# Patient Record
Sex: Female | Born: 1955
Health system: Southern US, Community
[De-identification: ages and names within clinical notes are randomized; demographics above are authoritative.]

## PROBLEM LIST (undated history)

## (undated) DIAGNOSIS — E785 Hyperlipidemia, unspecified: Secondary | ICD-10-CM

## (undated) DIAGNOSIS — G63 Polyneuropathy in diseases classified elsewhere: Secondary | ICD-10-CM

## (undated) DIAGNOSIS — I10 Essential (primary) hypertension: Secondary | ICD-10-CM

## (undated) DIAGNOSIS — R0600 Dyspnea, unspecified: Secondary | ICD-10-CM

## (undated) DIAGNOSIS — R112 Nausea with vomiting, unspecified: Secondary | ICD-10-CM

## (undated) DIAGNOSIS — Z9889 Other specified postprocedural states: Secondary | ICD-10-CM

## (undated) DIAGNOSIS — Z923 Personal history of irradiation: Secondary | ICD-10-CM

## (undated) DIAGNOSIS — I890 Lymphedema, not elsewhere classified: Secondary | ICD-10-CM

## (undated) DIAGNOSIS — C50919 Malignant neoplasm of unspecified site of unspecified female breast: Secondary | ICD-10-CM

## (undated) DIAGNOSIS — E119 Type 2 diabetes mellitus without complications: Secondary | ICD-10-CM

## (undated) DIAGNOSIS — C7951 Secondary malignant neoplasm of bone: Secondary | ICD-10-CM

## (undated) DIAGNOSIS — E669 Obesity, unspecified: Secondary | ICD-10-CM

## (undated) DIAGNOSIS — C801 Malignant (primary) neoplasm, unspecified: Secondary | ICD-10-CM

## (undated) HISTORY — DX: Malignant neoplasm of unspecified site of unspecified female breast: C50.919

## (undated) HISTORY — DX: Secondary malignant neoplasm of bone: C79.51

## (undated) HISTORY — DX: Obesity, unspecified: E66.9

## (undated) HISTORY — DX: Essential (primary) hypertension: I10

## (undated) HISTORY — DX: Lymphedema, not elsewhere classified: I89.0

## (undated) HISTORY — PX: CATARACT EXTRACTION: SUR2

## (undated) HISTORY — PX: MODIFIED RADICAL MASTECTOMY: SHX5703

## (undated) HISTORY — DX: Hyperlipidemia, unspecified: E78.5

## (undated) HISTORY — DX: Polyneuropathy in diseases classified elsewhere: G63

## (undated) HISTORY — PX: HYSTERECTOMY ABDOMINAL WITH SALPINGO-OOPHORECTOMY: SHX6792

## (undated) HISTORY — DX: Malignant (primary) neoplasm, unspecified: C80.1

## (undated) HISTORY — DX: Personal history of irradiation: Z92.3

---

## 2016-10-12 DIAGNOSIS — C50911 Malignant neoplasm of unspecified site of right female breast: Secondary | ICD-10-CM | POA: Diagnosis not present

## 2016-10-12 DIAGNOSIS — Z170 Estrogen receptor positive status [ER+]: Secondary | ICD-10-CM | POA: Diagnosis not present

## 2016-11-05 DIAGNOSIS — M272 Inflammatory conditions of jaws: Secondary | ICD-10-CM | POA: Diagnosis not present

## 2016-11-05 DIAGNOSIS — M8708 Idiopathic aseptic necrosis of bone, other site: Secondary | ICD-10-CM | POA: Diagnosis not present

## 2016-11-23 DIAGNOSIS — E119 Type 2 diabetes mellitus without complications: Secondary | ICD-10-CM | POA: Diagnosis not present

## 2016-11-23 DIAGNOSIS — E782 Mixed hyperlipidemia: Secondary | ICD-10-CM | POA: Diagnosis not present

## 2016-11-23 DIAGNOSIS — I10 Essential (primary) hypertension: Secondary | ICD-10-CM | POA: Diagnosis not present

## 2016-11-23 DIAGNOSIS — C50919 Malignant neoplasm of unspecified site of unspecified female breast: Secondary | ICD-10-CM | POA: Diagnosis not present

## 2016-12-14 DIAGNOSIS — Z170 Estrogen receptor positive status [ER+]: Secondary | ICD-10-CM | POA: Diagnosis not present

## 2016-12-14 DIAGNOSIS — C50911 Malignant neoplasm of unspecified site of right female breast: Secondary | ICD-10-CM | POA: Diagnosis not present

## 2016-12-25 DIAGNOSIS — I972 Postmastectomy lymphedema syndrome: Secondary | ICD-10-CM | POA: Diagnosis not present

## 2016-12-25 DIAGNOSIS — C50911 Malignant neoplasm of unspecified site of right female breast: Secondary | ICD-10-CM | POA: Diagnosis not present

## 2016-12-25 DIAGNOSIS — L905 Scar conditions and fibrosis of skin: Secondary | ICD-10-CM | POA: Diagnosis not present

## 2017-01-04 DIAGNOSIS — C50911 Malignant neoplasm of unspecified site of right female breast: Secondary | ICD-10-CM | POA: Diagnosis not present

## 2017-01-04 DIAGNOSIS — L905 Scar conditions and fibrosis of skin: Secondary | ICD-10-CM | POA: Diagnosis not present

## 2017-01-04 DIAGNOSIS — I972 Postmastectomy lymphedema syndrome: Secondary | ICD-10-CM | POA: Diagnosis not present

## 2017-01-10 DIAGNOSIS — I972 Postmastectomy lymphedema syndrome: Secondary | ICD-10-CM | POA: Diagnosis not present

## 2017-01-10 DIAGNOSIS — L905 Scar conditions and fibrosis of skin: Secondary | ICD-10-CM | POA: Diagnosis not present

## 2017-01-10 DIAGNOSIS — C50911 Malignant neoplasm of unspecified site of right female breast: Secondary | ICD-10-CM | POA: Diagnosis not present

## 2017-01-22 DIAGNOSIS — I972 Postmastectomy lymphedema syndrome: Secondary | ICD-10-CM | POA: Diagnosis not present

## 2017-01-22 DIAGNOSIS — C50911 Malignant neoplasm of unspecified site of right female breast: Secondary | ICD-10-CM | POA: Diagnosis not present

## 2017-01-22 DIAGNOSIS — L905 Scar conditions and fibrosis of skin: Secondary | ICD-10-CM | POA: Diagnosis not present

## 2017-01-31 DIAGNOSIS — C50911 Malignant neoplasm of unspecified site of right female breast: Secondary | ICD-10-CM | POA: Diagnosis not present

## 2017-01-31 DIAGNOSIS — L905 Scar conditions and fibrosis of skin: Secondary | ICD-10-CM | POA: Diagnosis not present

## 2017-01-31 DIAGNOSIS — I972 Postmastectomy lymphedema syndrome: Secondary | ICD-10-CM | POA: Diagnosis not present

## 2017-02-08 DIAGNOSIS — L905 Scar conditions and fibrosis of skin: Secondary | ICD-10-CM | POA: Diagnosis not present

## 2017-02-08 DIAGNOSIS — I972 Postmastectomy lymphedema syndrome: Secondary | ICD-10-CM | POA: Diagnosis not present

## 2017-02-08 DIAGNOSIS — C50911 Malignant neoplasm of unspecified site of right female breast: Secondary | ICD-10-CM | POA: Diagnosis not present

## 2017-02-08 DIAGNOSIS — Z170 Estrogen receptor positive status [ER+]: Secondary | ICD-10-CM | POA: Diagnosis not present

## 2017-02-15 DIAGNOSIS — L905 Scar conditions and fibrosis of skin: Secondary | ICD-10-CM | POA: Diagnosis not present

## 2017-02-15 DIAGNOSIS — C50911 Malignant neoplasm of unspecified site of right female breast: Secondary | ICD-10-CM | POA: Diagnosis not present

## 2017-02-15 DIAGNOSIS — I972 Postmastectomy lymphedema syndrome: Secondary | ICD-10-CM | POA: Diagnosis not present

## 2017-02-21 DIAGNOSIS — I972 Postmastectomy lymphedema syndrome: Secondary | ICD-10-CM | POA: Diagnosis not present

## 2017-02-21 DIAGNOSIS — L905 Scar conditions and fibrosis of skin: Secondary | ICD-10-CM | POA: Diagnosis not present

## 2017-02-21 DIAGNOSIS — C50911 Malignant neoplasm of unspecified site of right female breast: Secondary | ICD-10-CM | POA: Diagnosis not present

## 2017-02-28 DIAGNOSIS — C50911 Malignant neoplasm of unspecified site of right female breast: Secondary | ICD-10-CM | POA: Diagnosis not present

## 2017-02-28 DIAGNOSIS — I972 Postmastectomy lymphedema syndrome: Secondary | ICD-10-CM | POA: Diagnosis not present

## 2017-02-28 DIAGNOSIS — L905 Scar conditions and fibrosis of skin: Secondary | ICD-10-CM | POA: Diagnosis not present

## 2017-03-07 DIAGNOSIS — L905 Scar conditions and fibrosis of skin: Secondary | ICD-10-CM | POA: Diagnosis not present

## 2017-03-07 DIAGNOSIS — C50911 Malignant neoplasm of unspecified site of right female breast: Secondary | ICD-10-CM | POA: Diagnosis not present

## 2017-03-07 DIAGNOSIS — I972 Postmastectomy lymphedema syndrome: Secondary | ICD-10-CM | POA: Diagnosis not present

## 2017-03-14 DIAGNOSIS — L905 Scar conditions and fibrosis of skin: Secondary | ICD-10-CM | POA: Diagnosis not present

## 2017-03-14 DIAGNOSIS — C50911 Malignant neoplasm of unspecified site of right female breast: Secondary | ICD-10-CM | POA: Diagnosis not present

## 2017-03-14 DIAGNOSIS — I972 Postmastectomy lymphedema syndrome: Secondary | ICD-10-CM | POA: Diagnosis not present

## 2017-03-26 DIAGNOSIS — L905 Scar conditions and fibrosis of skin: Secondary | ICD-10-CM | POA: Diagnosis not present

## 2017-03-26 DIAGNOSIS — I972 Postmastectomy lymphedema syndrome: Secondary | ICD-10-CM | POA: Diagnosis not present

## 2017-03-26 DIAGNOSIS — C50911 Malignant neoplasm of unspecified site of right female breast: Secondary | ICD-10-CM | POA: Diagnosis not present

## 2017-03-29 DIAGNOSIS — C50911 Malignant neoplasm of unspecified site of right female breast: Secondary | ICD-10-CM | POA: Diagnosis not present

## 2017-03-29 DIAGNOSIS — Z170 Estrogen receptor positive status [ER+]: Secondary | ICD-10-CM | POA: Diagnosis not present

## 2017-04-09 DIAGNOSIS — L905 Scar conditions and fibrosis of skin: Secondary | ICD-10-CM | POA: Diagnosis not present

## 2017-04-09 DIAGNOSIS — I972 Postmastectomy lymphedema syndrome: Secondary | ICD-10-CM | POA: Diagnosis not present

## 2017-04-09 DIAGNOSIS — C50911 Malignant neoplasm of unspecified site of right female breast: Secondary | ICD-10-CM | POA: Diagnosis not present

## 2017-04-12 DIAGNOSIS — Z170 Estrogen receptor positive status [ER+]: Secondary | ICD-10-CM | POA: Diagnosis not present

## 2017-04-12 DIAGNOSIS — C50911 Malignant neoplasm of unspecified site of right female breast: Secondary | ICD-10-CM | POA: Diagnosis not present

## 2017-05-16 DIAGNOSIS — I10 Essential (primary) hypertension: Secondary | ICD-10-CM | POA: Diagnosis not present

## 2017-05-16 DIAGNOSIS — E119 Type 2 diabetes mellitus without complications: Secondary | ICD-10-CM | POA: Diagnosis not present

## 2017-05-16 DIAGNOSIS — E782 Mixed hyperlipidemia: Secondary | ICD-10-CM | POA: Diagnosis not present

## 2017-05-17 DIAGNOSIS — Z170 Estrogen receptor positive status [ER+]: Secondary | ICD-10-CM | POA: Diagnosis not present

## 2017-05-17 DIAGNOSIS — C50911 Malignant neoplasm of unspecified site of right female breast: Secondary | ICD-10-CM | POA: Diagnosis not present

## 2017-05-22 DIAGNOSIS — C50919 Malignant neoplasm of unspecified site of unspecified female breast: Secondary | ICD-10-CM | POA: Diagnosis not present

## 2017-05-22 DIAGNOSIS — E119 Type 2 diabetes mellitus without complications: Secondary | ICD-10-CM | POA: Diagnosis not present

## 2017-05-22 DIAGNOSIS — E782 Mixed hyperlipidemia: Secondary | ICD-10-CM | POA: Diagnosis not present

## 2017-05-22 DIAGNOSIS — G629 Polyneuropathy, unspecified: Secondary | ICD-10-CM | POA: Diagnosis not present

## 2017-05-22 DIAGNOSIS — I10 Essential (primary) hypertension: Secondary | ICD-10-CM | POA: Diagnosis not present

## 2017-06-11 DIAGNOSIS — I972 Postmastectomy lymphedema syndrome: Secondary | ICD-10-CM | POA: Diagnosis not present

## 2017-06-11 DIAGNOSIS — L905 Scar conditions and fibrosis of skin: Secondary | ICD-10-CM | POA: Diagnosis not present

## 2017-06-11 DIAGNOSIS — C50911 Malignant neoplasm of unspecified site of right female breast: Secondary | ICD-10-CM | POA: Diagnosis not present

## 2017-07-05 DIAGNOSIS — L905 Scar conditions and fibrosis of skin: Secondary | ICD-10-CM | POA: Diagnosis not present

## 2017-07-05 DIAGNOSIS — C50911 Malignant neoplasm of unspecified site of right female breast: Secondary | ICD-10-CM | POA: Diagnosis not present

## 2017-07-05 DIAGNOSIS — I972 Postmastectomy lymphedema syndrome: Secondary | ICD-10-CM | POA: Diagnosis not present

## 2017-07-16 DIAGNOSIS — C50911 Malignant neoplasm of unspecified site of right female breast: Secondary | ICD-10-CM | POA: Diagnosis not present

## 2017-07-16 DIAGNOSIS — I972 Postmastectomy lymphedema syndrome: Secondary | ICD-10-CM | POA: Diagnosis not present

## 2017-07-16 DIAGNOSIS — L905 Scar conditions and fibrosis of skin: Secondary | ICD-10-CM | POA: Diagnosis not present

## 2017-08-01 DIAGNOSIS — C50911 Malignant neoplasm of unspecified site of right female breast: Secondary | ICD-10-CM | POA: Diagnosis not present

## 2017-08-01 DIAGNOSIS — I972 Postmastectomy lymphedema syndrome: Secondary | ICD-10-CM | POA: Diagnosis not present

## 2017-08-01 DIAGNOSIS — L905 Scar conditions and fibrosis of skin: Secondary | ICD-10-CM | POA: Diagnosis not present

## 2017-08-26 DIAGNOSIS — L905 Scar conditions and fibrosis of skin: Secondary | ICD-10-CM | POA: Diagnosis not present

## 2017-08-26 DIAGNOSIS — I972 Postmastectomy lymphedema syndrome: Secondary | ICD-10-CM | POA: Diagnosis not present

## 2017-08-26 DIAGNOSIS — C50911 Malignant neoplasm of unspecified site of right female breast: Secondary | ICD-10-CM | POA: Diagnosis not present

## 2017-09-06 DIAGNOSIS — I972 Postmastectomy lymphedema syndrome: Secondary | ICD-10-CM | POA: Diagnosis not present

## 2017-09-06 DIAGNOSIS — L905 Scar conditions and fibrosis of skin: Secondary | ICD-10-CM | POA: Diagnosis not present

## 2017-09-06 DIAGNOSIS — C50911 Malignant neoplasm of unspecified site of right female breast: Secondary | ICD-10-CM | POA: Diagnosis not present

## 2017-09-17 DIAGNOSIS — L905 Scar conditions and fibrosis of skin: Secondary | ICD-10-CM | POA: Diagnosis not present

## 2017-09-17 DIAGNOSIS — I972 Postmastectomy lymphedema syndrome: Secondary | ICD-10-CM | POA: Diagnosis not present

## 2017-09-17 DIAGNOSIS — C50911 Malignant neoplasm of unspecified site of right female breast: Secondary | ICD-10-CM | POA: Diagnosis not present

## 2017-10-04 DIAGNOSIS — C50911 Malignant neoplasm of unspecified site of right female breast: Secondary | ICD-10-CM | POA: Diagnosis not present

## 2017-10-04 DIAGNOSIS — Z170 Estrogen receptor positive status [ER+]: Secondary | ICD-10-CM | POA: Diagnosis not present

## 2017-10-10 DIAGNOSIS — I972 Postmastectomy lymphedema syndrome: Secondary | ICD-10-CM | POA: Diagnosis not present

## 2017-10-10 DIAGNOSIS — L905 Scar conditions and fibrosis of skin: Secondary | ICD-10-CM | POA: Diagnosis not present

## 2017-10-10 DIAGNOSIS — C50911 Malignant neoplasm of unspecified site of right female breast: Secondary | ICD-10-CM | POA: Diagnosis not present

## 2017-10-24 DIAGNOSIS — L905 Scar conditions and fibrosis of skin: Secondary | ICD-10-CM | POA: Diagnosis not present

## 2017-10-24 DIAGNOSIS — Z170 Estrogen receptor positive status [ER+]: Secondary | ICD-10-CM | POA: Diagnosis not present

## 2017-10-24 DIAGNOSIS — C50911 Malignant neoplasm of unspecified site of right female breast: Secondary | ICD-10-CM | POA: Diagnosis not present

## 2017-10-24 DIAGNOSIS — I972 Postmastectomy lymphedema syndrome: Secondary | ICD-10-CM | POA: Diagnosis not present

## 2017-11-14 DIAGNOSIS — C50911 Malignant neoplasm of unspecified site of right female breast: Secondary | ICD-10-CM | POA: Diagnosis not present

## 2017-11-14 DIAGNOSIS — L905 Scar conditions and fibrosis of skin: Secondary | ICD-10-CM | POA: Diagnosis not present

## 2017-11-14 DIAGNOSIS — I972 Postmastectomy lymphedema syndrome: Secondary | ICD-10-CM | POA: Diagnosis not present

## 2017-11-20 DIAGNOSIS — I10 Essential (primary) hypertension: Secondary | ICD-10-CM | POA: Diagnosis not present

## 2017-11-20 DIAGNOSIS — E119 Type 2 diabetes mellitus without complications: Secondary | ICD-10-CM | POA: Diagnosis not present

## 2017-11-20 DIAGNOSIS — E782 Mixed hyperlipidemia: Secondary | ICD-10-CM | POA: Diagnosis not present

## 2017-11-20 LAB — BASIC METABOLIC PANEL
BUN: 14 (ref 4–21)
CREATININE: 0.9 (ref 0.5–1.1)
Glucose: 181
Potassium: 4.3 (ref 3.4–5.3)
Sodium: 137 (ref 137–147)

## 2017-11-20 LAB — HEMOGLOBIN A1C: HEMOGLOBIN A1C: 8.2

## 2017-11-29 DIAGNOSIS — C50911 Malignant neoplasm of unspecified site of right female breast: Secondary | ICD-10-CM | POA: Diagnosis not present

## 2017-11-29 DIAGNOSIS — Z170 Estrogen receptor positive status [ER+]: Secondary | ICD-10-CM | POA: Diagnosis not present

## 2017-12-04 DIAGNOSIS — E782 Mixed hyperlipidemia: Secondary | ICD-10-CM | POA: Diagnosis not present

## 2017-12-04 DIAGNOSIS — E1165 Type 2 diabetes mellitus with hyperglycemia: Secondary | ICD-10-CM | POA: Diagnosis not present

## 2017-12-04 DIAGNOSIS — C50919 Malignant neoplasm of unspecified site of unspecified female breast: Secondary | ICD-10-CM | POA: Diagnosis not present

## 2017-12-04 DIAGNOSIS — I10 Essential (primary) hypertension: Secondary | ICD-10-CM | POA: Diagnosis not present

## 2017-12-05 DIAGNOSIS — E782 Mixed hyperlipidemia: Secondary | ICD-10-CM | POA: Diagnosis not present

## 2017-12-05 DIAGNOSIS — E1165 Type 2 diabetes mellitus with hyperglycemia: Secondary | ICD-10-CM | POA: Diagnosis not present

## 2017-12-05 DIAGNOSIS — C50919 Malignant neoplasm of unspecified site of unspecified female breast: Secondary | ICD-10-CM | POA: Diagnosis not present

## 2017-12-05 DIAGNOSIS — I10 Essential (primary) hypertension: Secondary | ICD-10-CM | POA: Diagnosis not present

## 2017-12-10 DIAGNOSIS — I972 Postmastectomy lymphedema syndrome: Secondary | ICD-10-CM | POA: Diagnosis not present

## 2017-12-10 DIAGNOSIS — C50911 Malignant neoplasm of unspecified site of right female breast: Secondary | ICD-10-CM | POA: Diagnosis not present

## 2017-12-10 DIAGNOSIS — L905 Scar conditions and fibrosis of skin: Secondary | ICD-10-CM | POA: Diagnosis not present

## 2017-12-17 DIAGNOSIS — C7951 Secondary malignant neoplasm of bone: Secondary | ICD-10-CM | POA: Diagnosis not present

## 2017-12-17 DIAGNOSIS — Z853 Personal history of malignant neoplasm of breast: Secondary | ICD-10-CM | POA: Diagnosis not present

## 2017-12-17 DIAGNOSIS — C50911 Malignant neoplasm of unspecified site of right female breast: Secondary | ICD-10-CM | POA: Diagnosis not present

## 2017-12-24 DIAGNOSIS — L905 Scar conditions and fibrosis of skin: Secondary | ICD-10-CM | POA: Diagnosis not present

## 2017-12-24 DIAGNOSIS — I972 Postmastectomy lymphedema syndrome: Secondary | ICD-10-CM | POA: Diagnosis not present

## 2017-12-24 DIAGNOSIS — C50911 Malignant neoplasm of unspecified site of right female breast: Secondary | ICD-10-CM | POA: Diagnosis not present

## 2017-12-27 ENCOUNTER — Telehealth: Payer: Self-pay | Admitting: Oncology

## 2017-12-27 NOTE — Telephone Encounter (Signed)
Pt will be transferring care from [place]. Aware that she should bring all of her current records along with her in addition to what has been faxed. Pt aware to arrive 30 minutes early.

## 2018-01-07 DIAGNOSIS — L905 Scar conditions and fibrosis of skin: Secondary | ICD-10-CM | POA: Diagnosis not present

## 2018-01-07 DIAGNOSIS — C50911 Malignant neoplasm of unspecified site of right female breast: Secondary | ICD-10-CM | POA: Diagnosis not present

## 2018-01-07 DIAGNOSIS — I972 Postmastectomy lymphedema syndrome: Secondary | ICD-10-CM | POA: Diagnosis not present

## 2018-01-10 DIAGNOSIS — R51 Headache: Secondary | ICD-10-CM | POA: Diagnosis not present

## 2018-01-21 DIAGNOSIS — L905 Scar conditions and fibrosis of skin: Secondary | ICD-10-CM | POA: Diagnosis not present

## 2018-01-21 DIAGNOSIS — I972 Postmastectomy lymphedema syndrome: Secondary | ICD-10-CM | POA: Diagnosis not present

## 2018-01-21 DIAGNOSIS — C50911 Malignant neoplasm of unspecified site of right female breast: Secondary | ICD-10-CM | POA: Diagnosis not present

## 2018-01-24 DIAGNOSIS — Z170 Estrogen receptor positive status [ER+]: Secondary | ICD-10-CM | POA: Diagnosis not present

## 2018-01-24 DIAGNOSIS — C50911 Malignant neoplasm of unspecified site of right female breast: Secondary | ICD-10-CM | POA: Diagnosis not present

## 2018-01-31 DIAGNOSIS — E119 Type 2 diabetes mellitus without complications: Secondary | ICD-10-CM | POA: Diagnosis not present

## 2018-01-31 DIAGNOSIS — E782 Mixed hyperlipidemia: Secondary | ICD-10-CM | POA: Diagnosis not present

## 2018-01-31 DIAGNOSIS — I10 Essential (primary) hypertension: Secondary | ICD-10-CM | POA: Diagnosis not present

## 2018-01-31 LAB — LIPID PANEL
CHOLESTEROL: 183 (ref 0–200)
HDL: 42 (ref 35–70)
LDL CALC: 100
LDl/HDL Ratio: 4
Triglycerides: 205 — AB (ref 40–160)

## 2018-01-31 LAB — BASIC METABOLIC PANEL
BUN: 22 — AB (ref 4–21)
Creatinine: 1 (ref 0.5–1.1)
Glucose: 136
Potassium: 5.1 (ref 3.4–5.3)
Sodium: 140 (ref 137–147)

## 2018-01-31 LAB — HEPATIC FUNCTION PANEL
ALT: 26 (ref 7–35)
AST: 24 (ref 13–35)
Bilirubin, Total: 0.2

## 2018-01-31 LAB — HEMOGLOBIN A1C: HEMOGLOBIN A1C: 7.1

## 2018-02-04 DIAGNOSIS — C50911 Malignant neoplasm of unspecified site of right female breast: Secondary | ICD-10-CM | POA: Diagnosis not present

## 2018-02-04 DIAGNOSIS — I972 Postmastectomy lymphedema syndrome: Secondary | ICD-10-CM | POA: Diagnosis not present

## 2018-02-04 DIAGNOSIS — L905 Scar conditions and fibrosis of skin: Secondary | ICD-10-CM | POA: Diagnosis not present

## 2018-02-20 DIAGNOSIS — R51 Headache: Secondary | ICD-10-CM | POA: Diagnosis not present

## 2018-02-20 DIAGNOSIS — G44309 Post-traumatic headache, unspecified, not intractable: Secondary | ICD-10-CM | POA: Diagnosis not present

## 2018-02-20 DIAGNOSIS — S0990XS Unspecified injury of head, sequela: Secondary | ICD-10-CM | POA: Diagnosis not present

## 2018-02-20 DIAGNOSIS — I10 Essential (primary) hypertension: Secondary | ICD-10-CM | POA: Diagnosis not present

## 2018-03-10 NOTE — Progress Notes (Signed)
[facility] [facility]  [phone] [phone]     ID: Mia [name] DOB: April 21, 1956  MR#: [id number]  JYN#:[id number]  Patient Care Team: Lucille Passy, MD as PCP - General (Family Medicine) Magrinat, Virgie Dad, MD as Consulting Physician (Oncology) [name], [name] PA  CHIEF COMPLAINT: Estrogen receptor positive stage IV breast cancer  CURRENT TREATMENT: Letrozole, palbociclib   HISTORY OF CURRENT ILLNESS: We have reviewed the medical records from [place], which is the source of the information below:  Mia [name] was initially diagnosed with Stage IIIA (T2N2M0) invasive ductal carcinoma, estrogen receptor positive and HER2 negative right breast cancer in 2000. Mia [name] underwent right mastectomy, with 4 positive metastatic lymph nodes. Mia [name] had chemotherapy with taxol and cytoxan and fulvestrant in the past. Mia [name] had radiation and completed 5 years of tamoxifen.   Mia [name] was diagnosed with Stage IV disease 04/18/2005 with metastases to the bone, lung, and additional nodes. Mia [name] was on capecitabine but was discontinued after rising CA 27-29 and scans.   Mia [name] started anastrozole and everolimus with Delton See in April 2014. Mia [name] tolerated this well, but this was discontinued in July 2014 due to abnormal blood tests, hyperlipidemia and hyperglycemia.  Mia [name] began Abraxane '100mg'$  /m2 and Xgeva in August 2014 weekly x3 with 1 week off. Mia [name] tolerated this treatment well. Mia [name] discontinued Xgeva January 2016 due to osteonecrosis of the jaw and mouth issues. Mia [name] continued abraxane alone until her last dose on 11/18/2015 due to neuropathy and disease progression with restaging studies on 11/23/2015 with a CT chest abdomen and pelvis and one scan showing skull, sternal and femoral lesions.   Mia [name] was switched to gemcitabine starting 01/20/2016 weekly x3 and 1 week off. This was discontinued on 06/15/2016 due to a port related jugular clot on the right  side. Mia [name] was given Lovenox for 3 months.  Mia [name] was switched to Letrozole 2.5 mg and palbociclib 125 mg starting 07/13/2016. Mia [name] tolerated this well. Restaging studies with CT chest abdomen and pelvis and bone scan on 07/01/2017 showed stable/ improving lesions. CA 27-29 was stable.  The patient's subsequent history is as detailed below.  INTERVAL HISTORY: [name] was evaluated in the breast cancer clinic on 03/12/2018.  We reviewed additional records.  During the last few months of follow up in [place], Mia [name] completed restaging studies with a CT chest abdomen and pelvis at [facility] on 12/17/2017 showing: A 6 mm pulmonary nodule in the left upper lobe laterally. Progression of metastatic disease to the bones at the L4 and L5 vertebral bodies. Sclerotic metastases at T8, T10, an T11, are similar to previous exams. Sclerotic metastases in the sternum stable. Hepatic steatosis. Aortic atherosclerosis.  Most recent CA-18-29 (January 2019) was 58.  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 Mia [name] also completed 62 radiation treatments in the T spine and radiation in the femur. The patient denies unusual headaches, visual changes, nausea, vomiting, stiff neck, dizziness, or gait imbalance. There has been no cough, phlegm production, or pleurisy, no chest pain or pressure, and no change in bowel or bladder habits. The patient denies fever, rash, bleeding, unexplained fatigue or unexplained weight loss. A detailed review of systems was otherwise entirely negative.   PAST MEDICAL HISTORY: Past Medical History:  Diagnosis Date  . Breast cancer metastasized to bone ([place])   .  Hyperlipidemia   . Hypertension   . Lymphedema of right arm   . Neuropathy associated with cancer ([place])   . Obesity (BMI 35.0-39.9 without comorbidity)      PAST SURGICAL  HISTORY: Past Surgical History:  Procedure Laterality Date  . CATARACT EXTRACTION Left   . HYSTERECTOMY ABDOMINAL WITH SALPINGO-OOPHORECTOMY    . MODIFIED RADICAL MASTECTOMY Right       FAMILY HISTORY No family history on file.  The patient reports Mia [name] had negative genetic testing at [facility] 2014,report not available. --The patient's father died at age 22 due to a PE, and he also had a history of colon cancer diagnosed at age 44. The patient's mother died at age 23 due to diabetes and survived a cerebral aneurysm.The patient's mother also had a history of breast cancer diagnosed at age 54.  The patient has no brothers and 1 sister. The patient's sister was diagnosed with non invasive breast cancer at age 62. There was a paternal grandmother diagnosed with breast cancer at age 62. The patient denies a family history of ovarian cancer.    GYNECOLOGIC HISTORY:  No LMP recorded. Menarche: 62 years old Age at first live birth: 62 years old Mia [name] is [place] P2. Mia [name] is status post hysterectomy with bilateral salpingo- oophorectomy in 2009 at about age 62.  Mia [name] never used HRT.    SOCIAL HISTORY:  [name] is disabled due to her breast cancer. 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 Her husband, [name] is in the process of retiring. He is a Electrical engineer for a power company. The patient's daughter, Mia [name], lives in [place] and works as a Teaching laboratory technician. The patient's second daughter, Mia [name] is a stay at home mom and is a special needs teacher, who will soon move to [facility] The patient plans on attending [employer].      ADVANCED DIRECTIVES:    HEALTH MAINTENANCE: Social History   Tobacco Use  . Smoking status: Not on file  Substance Use Topics  . Alcohol use: Not on file  . Drug use: Not on file     Colonoscopy: 2017   PAP:  Bone density: never  Mammogram: 2017   Allergies  Allergen Reactions  .  Morphine And Related Anaphylaxis    Current Outpatient Medications  Medication Sig Dispense Refill  . amlodipine-atorvastatin (CADUET) 10-10 MG tablet Take 1 tablet by mouth daily.    Marland Kitchen aspirin 81 MG chewable tablet Chew by mouth daily.    . fenofibrate (TRICOR) 145 MG tablet Take 145 mg by mouth daily.    Marland Kitchen gabapentin (NEURONTIN) 300 MG capsule Take 300 mg by mouth 3 (three) times daily.    . Insulin Admin Supplies MISC Inject 24 Units into the skin daily.    Marland Kitchen letrozole (FEMARA) 2.5 MG tablet Take 2.5 mg by mouth daily.    Marland Kitchen losartan (COZAAR) 100 MG tablet Take 100 mg by mouth daily.    . metoprolol tartrate (LOPRESSOR) 25 MG tablet Take 25 mg by mouth daily.    Marland Kitchen oxyCODONE-acetaminophen (PERCOCET) 10-325 MG tablet Take 1 tablet by mouth 3 (three) times daily.    . palbociclib (IBRANCE) 125 MG capsule Take 125 mg by mouth daily with breakfast. Take whole with food. Take for 21 days on, 7 days off, repeat every 28 days.    . pantoprazole (PROTONIX) 40 MG tablet Take 40 mg by mouth daily.    . pioglitazone (ACTOS) 15 MG  tablet Take 15 mg by mouth daily.    . pravastatin (PRAVACHOL) 40 MG tablet Take 40 mg by mouth daily.    . sitaGLIPtin-metformin (JANUMET) 50-1000 MG tablet Take 1 tablet by mouth daily.     No current facility-administered medications for this visit.     OBJECTIVE: Middle-aged white woman who appears stated age  62:   03/12/18 1558  BP: 132/76  Pulse: 73  Resp: 18  Temp: 98.3 F (36.8 C)  SpO2: 98%     Body mass index is 38.42 kg/m.   Wt Readings from Last 3 Encounters:  03/12/18 222 lb 1.6 oz (100.7 kg)      ECOG FS:1 - Symptomatic but completely ambulatory  Ocular: Sclerae unicteric, pupils round and equal Ear-nose-throat: Oropharynx clear and moist Lymphatic: No cervical or supraclavicular adenopathy Lungs no rales or rhonchi Heart regular rate and rhythm Abd soft, nontender, positive bowel sounds MSK no focal spinal tenderness, chronic grade 1  right upper extremity lymphedema Neuro: non-focal, well-oriented, appropriate affect Breasts: The right breast is status post mastectomy.  There is no evidence of chest wall recurrence.  Left breast is unremarkable.  Both axillae are benign.   LAB RESULTS:  CMP     Component Value Date/Time   NA 138 03/12/2018 1544   K 4.3 03/12/2018 1544   CL 102 03/12/2018 1544   CO2 28 03/12/2018 1544   GLUCOSE 114 03/12/2018 1544   BUN 20 03/12/2018 1544   CREATININE 0.97 03/12/2018 1544   CALCIUM 9.7 03/12/2018 1544   PROT 7.1 03/12/2018 1544   ALBUMIN 3.7 03/12/2018 1544   AST 16 03/12/2018 1544   ALT 12 03/12/2018 1544   ALKPHOS 61 03/12/2018 1544   BILITOT 0.3 03/12/2018 1544   GFRNONAA >60 03/12/2018 1544   GFRAA >60 03/12/2018 1544    No results found for: TOTALPROTELP, ALBUMINELP, A1GS, A2GS, BETS, BETA2SER, GAMS, MSPIKE, SPEI  No results found for: KPAFRELGTCHN, LAMBDASER, KAPLAMBRATIO  Lab Results  Component Value Date   WBC 3.0 (L) 03/12/2018   NEUTROABS 2.0 03/12/2018   HGB 11.5 (L) 03/12/2018   HCT 35.2 03/12/2018   MCV 102.3 (H) 03/12/2018   PLT 382 03/12/2018    '@LASTCHEMISTRY'$ @  No results found for: LABCA2  No components found for: ASNKNL976  No results for input(s): INR in the last 168 hours.  No results found for: LABCA2  No results found for: BHA193  No results found for: XTK240  No results found for: XBD532  No results found for: CA2729  No components found for: HGQUANT  No results found for: CEA1 / No results found for: CEA1   No results found for: AFPTUMOR  No results found for: CHROMOGRNA  No results found for: PSA1  Appointment on 03/12/2018  Component Date Value Ref Range Status  . Sodium 03/12/2018 138  136 - 145 mmol/L Final  . Potassium 03/12/2018 4.3  3.5 - 5.1 mmol/L Final  . Chloride 03/12/2018 102  98 - 109 mmol/L Final  . CO2 03/12/2018 28  22 - 29 mmol/L Final  . Glucose, Bld 03/12/2018 114  70 - 140 mg/dL Final  . BUN  03/12/2018 20  7 - 26 mg/dL Final  . Creatinine, Ser 03/12/2018 0.97  0.60 - 1.10 mg/dL Final  . Calcium 03/12/2018 9.7  8.4 - 10.4 mg/dL Final  . Total Protein 03/12/2018 7.1  6.4 - 8.3 g/dL Final  . Albumin 03/12/2018 3.7  3.5 - 5.0 g/dL Final  . AST 03/12/2018 16  5 - 34 U/L Final  . ALT 03/12/2018 12  0 - 55 U/L Final  . Alkaline Phosphatase 03/12/2018 61  40 - 150 U/L Final  . Total Bilirubin 03/12/2018 0.3  0.2 - 1.2 mg/dL Final  . GFR calc non Af Amer 03/12/2018 >60  >60 mL/min Final  . GFR calc Af Amer 03/12/2018 >60  >60 mL/min Final   Comment: (NOTE) The eGFR has been calculated using the CKD EPI equation. This calculation has not been validated in all clinical situations. eGFR's persistently <60 mL/min signify possible Chronic Kidney Disease.   Georgiann Hahn gap 03/12/2018 8  3 - 11 Final   Performed at [facility], [place] [address] [place], [place] [address]  . WBC 03/12/2018 3.0* 3.9 - 10.3 K/uL Final  . RBC 03/12/2018 3.44* 3.70 - 5.45 MIL/uL Final  . Hemoglobin 03/12/2018 11.5* 11.6 - 15.9 g/dL Final  . HCT 03/12/2018 35.2  34.8 - 46.6 % Final  . MCV 03/12/2018 102.3* 79.5 - 101.0 fL Final  . MCH 03/12/2018 33.4  25.1 - 34.0 pg Final  . MCHC 03/12/2018 32.7  31.5 - 36.0 g/dL Final  . RDW 03/12/2018 15.1* 11.2 - 14.5 % Final  . Platelets 03/12/2018 382  145 - 400 K/uL Final  . Neutrophils Relative % 03/12/2018 66  % Final  . Neutro Abs 03/12/2018 2.0  1.5 - 6.5 K/uL Final  . Lymphocytes Relative 03/12/2018 26  % Final  . Lymphs Abs 03/12/2018 0.8* 0.9 - 3.3 K/uL Final  . Monocytes Relative 03/12/2018 5  % Final  . Monocytes Absolute 03/12/2018 0.1  0.1 - 0.9 K/uL Final  . Eosinophils Relative 03/12/2018 1  % Final  . Eosinophils Absolute 03/12/2018 0.0  0.0 - 0.5 K/uL Final  . Basophils Relative 03/12/2018 2  % Final  . Basophils Absolute 03/12/2018 0.1  0.0 - 0.1 K/uL Final   Performed at [facility], [place] [address] [place], [place] [address]    (this displays the last labs from the last 3 days)  No results found for: TOTALPROTELP, ALBUMINELP, A1GS, A2GS, BETS, BETA2SER, GAMS, MSPIKE, SPEI (this displays SPEP labs)  No results found for: KPAFRELGTCHN, LAMBDASER, KAPLAMBRATIO (kappa/lambda light chains)  No results found for: HGBA, HGBA2QUANT, HGBFQUANT, HGBSQUAN (Hemoglobinopathy evaluation)   No results found for: LDH  No results found for: IRON, TIBC, IRONPCTSAT (Iron and TIBC)  No results found for: FERRITIN  Urinalysis No results found for: COLORURINE, APPEARANCEUR, LABSPEC, PHURINE, GLUCOSEU, HGBUR, BILIRUBINUR, KETONESUR, PROTEINUR, UROBILINOGEN, NITRITE, LEUKOCYTESUR   STUDIES: Outside studies reviewed  ELIGIBLE FOR AVAILABLE RESEARCH PROTOCOL: *no  ASSESSMENT: 62 y.o. [place], [place] woman with stage IV breast cancer, as follows:  (1) status post right mastectomy in 2000, for a pT2 pN2, stage IIIA invasive ductal carcinoma, estrogen receptor positive, progesterone receptor not tested, HER-2 negative (0) by immunohistochemistry  (a) adjuvant chemotherapy with doxorubicin and cyclophosphamide in dose dense fashion x4 followed by paclitaxel in dose dense fashion x4  (b) adjuvant radiation: 30 doses  (c) antiestrogens: Tamoxifen for 5 years, completed 2005  (2) METASTASTIC DISEASE: 2008, involving bones, lungs, and lymph nodes  (a) CA 27-29 informative  (b) CT of the chest abdomen and pelvis in [facility] fine stable sclerotic metastases (T8, T10, T11, sternum, L4, L5; 0.6 cm left upper lobe lung nodule stable  (3)  prior anti-estrogen treatments:  (a) fulvestrant--progression  (b) exemestane/everolimus--hyperlipidemia, hyperglycemia  (4) prior chemotherapy treatments:  (a) capecitabine: progression  (b)  Abraxane, August 2014 through 11/18/2015: good response but stopped due to neuropathy  (c) gemcitabine 01/20/2016--with multiple interruptions secondary to  infections  (5) radiation therapy:  (a) T spine an Right femur, completed 01/10/2016  (6) letrozole/ palbociclib started 07/13/2016  (7) bone treatment:  (a) denosumab/Xgeva--discontinued January 2016 due to osteonecrosis of the jaw  (8) cancer associated pain:  (a) currently on oxycodone/APAP 10/325 QID with no recent change in dose  PLAN: We spent the better part of today's hour-long appointment discussing the biology of her diagnosis and the specifics of Lynett's situation. Mia [name] understands that stage IV breast cancer is not curable with our current knowledge base. The goal of treatment is control. The strategy of treatment is to do only the minimum necessary to control the growth of the tumor so that the patient can have as normal a life as possible. There is no survival advantage in treating aggressively if treating less aggressively results in tumor control. With this strategy stage IV breast cancer in many cases can function as a "chronic illness": something that cannot be quite gotten rid of but can be controlled for an indefinite period of time--and in fact Mia [name] is an excellent example of this.  Graceann has been on her current treatment, letrozole plus palbociclib, for a little over a year and a half.  Mia [name] tolerates this well.  Mia [name] has not required any dose reduction in the palbociclib.  Mia [name] does not have disabling symptoms related to the anti-estrogen.  Accordingly we will continue the current therapy until there is evidence of disease progression or intolerance  The most recent CT scan suggested progression in the lumbar area.  Mia [name] has not had radiation to this area, which is an option.  We can also discuss resuming denosumab/Xgeva pending further dental evaluation.  We will obtain a total spinal MRI which will serve as a new baseline.  Of course we are also repeating the CA-27-29.  If and when Mia [name] develops visceral disease we will obtain PD-L1 and foundation one testing.  Mia [name] would  benefit from referral to our physical therapy group for lymphedema management.  Also Mia [name] is deconditioned and I think would benefit from the live strong program and our tai chi program and Mia [name] has been given the appropriate information.  Mia [name] will see me again 04/25/2018.  From that point very likely we will see her every 2 months at least initially.  [place] has a good understanding of the overall plan. Mia [name] agrees with it. Mia [name] knows the goal of treatment in her case is control. Mia [name] will call with any problems that may develop before her next visit here.

## 2018-03-12 ENCOUNTER — Inpatient Hospital Stay: Payer: BLUE CROSS/BLUE SHIELD

## 2018-03-12 ENCOUNTER — Telehealth: Payer: Self-pay | Admitting: Oncology

## 2018-03-12 ENCOUNTER — Inpatient Hospital Stay: Payer: BLUE CROSS/BLUE SHIELD | Attending: Oncology | Admitting: Oncology

## 2018-03-12 ENCOUNTER — Encounter: Payer: Self-pay | Admitting: Oncology

## 2018-03-12 DIAGNOSIS — C779 Secondary and unspecified malignant neoplasm of lymph node, unspecified: Secondary | ICD-10-CM

## 2018-03-12 DIAGNOSIS — C50911 Malignant neoplasm of unspecified site of right female breast: Secondary | ICD-10-CM | POA: Insufficient documentation

## 2018-03-12 DIAGNOSIS — C50919 Malignant neoplasm of unspecified site of unspecified female breast: Secondary | ICD-10-CM | POA: Insufficient documentation

## 2018-03-12 DIAGNOSIS — Z9011 Acquired absence of right breast and nipple: Secondary | ICD-10-CM | POA: Diagnosis not present

## 2018-03-12 DIAGNOSIS — C7951 Secondary malignant neoplasm of bone: Secondary | ICD-10-CM | POA: Diagnosis not present

## 2018-03-12 DIAGNOSIS — Z79811 Long term (current) use of aromatase inhibitors: Secondary | ICD-10-CM | POA: Insufficient documentation

## 2018-03-12 DIAGNOSIS — Z923 Personal history of irradiation: Secondary | ICD-10-CM | POA: Diagnosis not present

## 2018-03-12 DIAGNOSIS — I10 Essential (primary) hypertension: Secondary | ICD-10-CM

## 2018-03-12 DIAGNOSIS — Z90710 Acquired absence of both cervix and uterus: Secondary | ICD-10-CM | POA: Diagnosis not present

## 2018-03-12 DIAGNOSIS — G620 Drug-induced polyneuropathy: Secondary | ICD-10-CM | POA: Insufficient documentation

## 2018-03-12 DIAGNOSIS — K760 Fatty (change of) liver, not elsewhere classified: Secondary | ICD-10-CM | POA: Insufficient documentation

## 2018-03-12 DIAGNOSIS — E119 Type 2 diabetes mellitus without complications: Secondary | ICD-10-CM | POA: Insufficient documentation

## 2018-03-12 DIAGNOSIS — Z803 Family history of malignant neoplasm of breast: Secondary | ICD-10-CM | POA: Insufficient documentation

## 2018-03-12 DIAGNOSIS — E785 Hyperlipidemia, unspecified: Secondary | ICD-10-CM | POA: Diagnosis not present

## 2018-03-12 DIAGNOSIS — Z170 Estrogen receptor positive status [ER+]: Secondary | ICD-10-CM | POA: Diagnosis not present

## 2018-03-12 DIAGNOSIS — E669 Obesity, unspecified: Secondary | ICD-10-CM

## 2018-03-12 DIAGNOSIS — C7800 Secondary malignant neoplasm of unspecified lung: Secondary | ICD-10-CM

## 2018-03-12 DIAGNOSIS — Z9221 Personal history of antineoplastic chemotherapy: Secondary | ICD-10-CM | POA: Insufficient documentation

## 2018-03-12 DIAGNOSIS — I700 Atherosclerosis of aorta: Secondary | ICD-10-CM | POA: Insufficient documentation

## 2018-03-12 DIAGNOSIS — G629 Polyneuropathy, unspecified: Secondary | ICD-10-CM | POA: Insufficient documentation

## 2018-03-12 DIAGNOSIS — E1169 Type 2 diabetes mellitus with other specified complication: Secondary | ICD-10-CM

## 2018-03-12 DIAGNOSIS — G893 Neoplasm related pain (acute) (chronic): Secondary | ICD-10-CM | POA: Insufficient documentation

## 2018-03-12 DIAGNOSIS — Z90722 Acquired absence of ovaries, bilateral: Secondary | ICD-10-CM | POA: Insufficient documentation

## 2018-03-12 DIAGNOSIS — M879 Osteonecrosis, unspecified: Secondary | ICD-10-CM

## 2018-03-12 DIAGNOSIS — T451X5A Adverse effect of antineoplastic and immunosuppressive drugs, initial encounter: Secondary | ICD-10-CM

## 2018-03-12 LAB — CBC WITH DIFFERENTIAL/PLATELET
BASOS ABS: 0.1 10*3/uL (ref 0.0–0.1)
Basophils Relative: 2 %
Eosinophils Absolute: 0 10*3/uL (ref 0.0–0.5)
Eosinophils Relative: 1 %
HEMATOCRIT: 35.2 % (ref 34.8–46.6)
HEMOGLOBIN: 11.5 g/dL — AB (ref 11.6–15.9)
LYMPHS PCT: 26 %
Lymphs Abs: 0.8 10*3/uL — ABNORMAL LOW (ref 0.9–3.3)
MCH: 33.4 pg (ref 25.1–34.0)
MCHC: 32.7 g/dL (ref 31.5–36.0)
MCV: 102.3 fL — ABNORMAL HIGH (ref 79.5–101.0)
Monocytes Absolute: 0.1 10*3/uL (ref 0.1–0.9)
Monocytes Relative: 5 %
NEUTROS ABS: 2 10*3/uL (ref 1.5–6.5)
NEUTROS PCT: 66 %
Platelets: 382 10*3/uL (ref 145–400)
RBC: 3.44 MIL/uL — AB (ref 3.70–5.45)
RDW: 15.1 % — ABNORMAL HIGH (ref 11.2–14.5)
WBC: 3 10*3/uL — AB (ref 3.9–10.3)

## 2018-03-12 LAB — COMPREHENSIVE METABOLIC PANEL
ALBUMIN: 3.7 g/dL (ref 3.5–5.0)
ALT: 12 U/L (ref 0–55)
ANION GAP: 8 (ref 3–11)
AST: 16 U/L (ref 5–34)
Alkaline Phosphatase: 61 U/L (ref 40–150)
BUN: 20 mg/dL (ref 7–26)
CO2: 28 mmol/L (ref 22–29)
Calcium: 9.7 mg/dL (ref 8.4–10.4)
Chloride: 102 mmol/L (ref 98–109)
Creatinine, Ser: 0.97 mg/dL (ref 0.60–1.10)
GFR calc Af Amer: >60 mL/min (ref >60)
GLUCOSE: 114 mg/dL (ref 70–140)
POTASSIUM: 4.3 mmol/L (ref 3.5–5.1)
Sodium: 138 mmol/L (ref 136–145)
TOTAL PROTEIN: 7.1 g/dL (ref 6.4–8.3)
Total Bilirubin: 0.3 mg/dL (ref 0.2–1.2)

## 2018-03-12 NOTE — Telephone Encounter (Signed)
Added lab per 6/12 sch msg - spoke w/ pt and confirmed appts.

## 2018-03-13 ENCOUNTER — Telehealth: Payer: Self-pay | Admitting: Pharmacist

## 2018-03-13 LAB — CANCER ANTIGEN 27.29: CA 27.29: 72.3 U/mL — ABNORMAL HIGH (ref 0.0–38.6)

## 2018-03-13 NOTE — Telephone Encounter (Signed)
Oral Oncology Pharmacist Encounter  Received new prescription for Ibrance (palbociclib) for the continued treatment of metastatic, hormone receptor positive breast cancer in conjunction with letrozole, planned duration until disease progression or unacceptable toxicity.  Patient transferring from [place], will now be under the care of Dr. [name]. Ibrance plus letrozole started 07/13/16 per MD note Original diagnosis in 2000 She has been extensively treated with multiple IV agents and locoregional radiation  Labs from 03/12/18 assessed, OK for continued teatment.  Current medication list in Epic reviewed, no significant DDIs with Ibrance identified.  Per MD, patient has enough Ibrance on hand for current cycle and next cycle. We will speak with patient to determine previous dispensing pharmacy for [facility]. New prescription from Dr. [name] will be sent to appropriate specialty pharmacy once insurance authorization is approved.  Oral Oncology Clinic will continue to follow.  Johny Drilling, PharmD, BCPS, BCOP  03/13/2018 8:21 AM Oral Oncology Clinic [phone]

## 2018-03-17 ENCOUNTER — Other Ambulatory Visit: Payer: Self-pay | Admitting: Oncology

## 2018-03-17 ENCOUNTER — Ambulatory Visit: Payer: BLUE CROSS/BLUE SHIELD | Attending: Oncology | Admitting: Physical Therapy

## 2018-03-17 DIAGNOSIS — C50919 Malignant neoplasm of unspecified site of unspecified female breast: Secondary | ICD-10-CM | POA: Insufficient documentation

## 2018-03-17 DIAGNOSIS — I972 Postmastectomy lymphedema syndrome: Secondary | ICD-10-CM

## 2018-03-17 NOTE — Therapy (Signed)
[place], [place], [address] Phone: [phone]   Fax:  [phone]  Physical Therapy Evaluation  Patient Details  Name: Mia [name] MRN: [id number] Date of Birth: 1956/06/25 Referring Provider: Dr. Gunnar Bulla Magrinat   Encounter Date: 03/17/2018  PT End of Session - 03/17/18 1713    Visit Number  1    Number of Visits  9    Date for PT Re-Evaluation  06/09/18 She won't start until 3 weeks after eval on 03/17/18    PT Start Time  1431    PT Stop Time  1514    PT Time Calculation (min)  43 min    Activity Tolerance  Patient tolerated treatment well    Behavior During Therapy  [facility] for tasks assessed/performed       Past Medical History:  Diagnosis Date  . Breast cancer metastasized to bone ([place])   . Hyperlipidemia   . Hypertension   . Lymphedema of right arm   . Neuropathy associated with cancer ([place])   . Obesity (BMI 35.0-39.9 without comorbidity)     Past Surgical History:  Procedure Laterality Date  . CATARACT EXTRACTION Left   . HYSTERECTOMY ABDOMINAL WITH SALPINGO-OOPHORECTOMY    . MODIFIED RADICAL MASTECTOMY Right     There were no vitals filed for this visit.   Subjective Assessment - 03/17/18 1433    Subjective  Here to help reduce the swelling in my arm. I'm in my 20th year of lymphedema due to mastectomy. I've been having lymphedema rinses for quite a number of years. She knows how to do self-manual lymph drainage but doesn't do it faithfully. She has a compression sleeve and a glove-to-elbow garment. Has no nighttime garment, having tried that but it keeps her awake. Moved here from [place] three weeks ago to be with daughter, son-in-law and granddaughter.    Pertinent History  Right breast cancer diagnosed 2000 with mastectomy with 16 lymph nodes removed (4 positive), chemo and radiation. 2008 had metastases in bones and lungs, and has been on treatment since. Just moved down  here three weeks ago and is seeing Dr. [name] now. Diabetes, high blood pressure, high cholesterol.    Patient Stated Goals  keep the swelling down, especially in the warm weather    Currently in Pain?  No/denies rarely gets pain at posterior upper right arm         [facility] PT Assessment - 03/17/18 0001      Assessment   Medical Diagnosis  stage IV right breast cancer    Referring Provider  Dr. Gunnar Bulla Magrinat    Onset Date/Surgical Date  -- 2000    Hand Dominance  Right      Precautions   Precautions  Other (comment)    Precaution Comments  metastatic breast cancer including bones      Restrictions   Weight Bearing Restrictions  No      Balance Screen   Has the patient fallen in the past 6 months  Yes    How many times?  1 slipped on her stairs    Has the patient had a decrease in activity level because of a fear of falling?   No    Is the patient reluctant to leave their home because of a fear of falling?   No      Home Environment   Living Environment  Private residence    Living Arrangements  Spouse/significant other    Type of Home  [place]  One level      Prior Function   Level of Independence  Independent    Vocation  On disability since 2011    Leisure  tries to stroll around the block a bit      Cognition   Overall Cognitive Status  Within Functional Limits for tasks assessed      Observation/Other Assessments   Other Surveys   -- LLIS score = 19.12      ROM / Strength   AROM / PROM / Strength  AROM      AROM   Overall AROM Comments  right shoulder mildly limited compared to left, and she reports pulling at mastectomy site at end range pt. does not feel limited with ADLs        LYMPHEDEMA/ONCOLOGY QUESTIONNAIRE - 03/17/18 1447      Type   Cancer Type  right breast, stage IV      Surgeries   Mastectomy Date  -- 2000    Number Lymph Nodes Removed  16 4 positive      Treatment   Past Chemotherapy Treatment  Yes    Past  Radiation Treatment  Yes    Current Hormone Treatment  Yes      What other symptoms do you have   Are you having pitting edema  Yes mild    Body Site  right forearm    Do you have infections  No      Lymphedema Assessments   Lymphedema Assessments  Upper extremities      Right Upper Extremity Lymphedema   15 cm Proximal to Olecranon Process  43.4 cm    10 cm Proximal to Olecranon Process  41.8 cm    Olecranon Process  30.8 cm    15 cm Proximal to Ulnar Styloid Process  32.6 cm    10 cm Proximal to Ulnar Styloid Process  31.3 cm    Just Proximal to Ulnar Styloid Process  21.8 cm    Across Hand at [employer]  21.2 cm    At [place] of 2nd Digit  6.7 cm      Left Upper Extremity Lymphedema   15 cm Proximal to Olecranon Process  41.4 cm    10 cm Proximal to Olecranon Process  39.8 cm    Olecranon Process  28.9 cm    15 cm Proximal to Ulnar Styloid Process  29.2 cm    10 cm Proximal to Ulnar Styloid Process  27.6 cm    Just Proximal to Ulnar Styloid Process  18.8 cm    Across Hand at [employer]  18.3 cm    At [place] of 2nd Digit  6.2 cm             Outpatient Re[facility] from [facility]17/[facility]  Lymphedema Life Impact Scale Total Score  19.12 %      Objective measurements completed on examination: See above findings.              PT Education - 03/17/18 1713    Education Details  briefly about the Flexitouch lymphedema pump; she does not wish to pursue a pump at this time    Person(s) Educated  Patient    Methods  Explanation    Comprehension  Verbalized understanding       PT Short Term Goals - 03/17/18 1723      PT SHORT TERM GOAL #1  Title  Pt. will be independent in correctly performing self-manual lymph drainage.    Time  4    Period  Weeks    Status  New      PT SHORT TERM GOAL #2   Title  Right arm circumference at 10 cm. proximal to ulnar styloid will stay below 31.3 cm. even as we head into the heat of  summer    Time  4    Period  Weeks    Status  New        PT Long Term Goals - 03/17/18 1726      PT LONG TERM GOAL #1   Title  Rt. arm circumference will reduce to 31.1 cm. or less.    Baseline  31.3 cm. at eval compared to 27.6 on the left    Time  8    Period  Weeks    Status  New      PT LONG TERM GOAL #2   Title  Pt. will be able to independently maintain arm by doing self-manual lymph drainage and using compression garments appropriately.             Plan - 03/17/18 1715    Clinical Impression Statement  This is a very pleasant woman who is now nearly 20 years out from her initial right breast cancer diagnosis and a decade+ out from having developed metastases. She has been living with lymphedema for much of this time. She has just moved to [place] from [place], where she received treatment for her lymphedema from a number of different providers. She does have significant swelling in the right UE (2-3.5 cm. larger at different levels of the arm than on the left). She also understands her own needs, having dealt with this for some time. She has a relatively new set of daytime garments. She has tried a nighttime garment in the past but chooses not to use one (or is unable to tolerate wearing one). We discussed the Flexitouch pump today, but she does not wish to pursue that at this time. We agreed that we would work together once a week initially, then decrease frequency to once every two weeks and then once a month. We will do manual lymph drainage and include reviewing this technique with her. We will also monitor her arm measurements to see that she is improving.  She does have mildly limited right shoulder AROM but does not feel this impedes her functionally at all.    History and Personal Factors relevant to plan of care:  metastatic cancer, including bony mets    Clinical Presentation  Stable    Clinical Decision Making  Low    Rehab Potential  Excellent    Clinical  Impairments Affecting Rehab Potential  none    PT Frequency  1x / week    PT Duration  4 weeks then decrease frequency to once every two weeks and then once a month    PT Treatment/Interventions  ADLs/Self Care Home Management;Patient/family education;DME Instruction;Manual lymph drainage;Manual techniques;Taping    PT Next Visit Plan  Manual lymph drainage for right UE; review self-manual lymph drainage. If she brings them, check the fit of her Elvarex arm garments.    Consulted and Agree with Plan of Care  Patient       Patient will benefit from skilled therapeutic intervention in order to improve the following deficits and impairments:  Increased edema  Visit Diagnosis: Postmastectomy lymphedema - Plan: PT plan of care cert/re-cert  Metastatic breast cancer ([place]) - Plan: PT plan of care cert/re-cert     Problem List Patient Active Problem List   Diagnosis Date Noted  . Metastatic breast cancer ([place]) 03/12/2018  . Bone metastases ([place]) 03/12/2018  . Lung metastases ([place]) 03/12/2018  . Osteonecrosis ([place]) 03/12/2018  . Peripheral neuropathy due to chemotherapy ([place]) 03/12/2018  . Diabetes mellitus type 2 in obese ([place]) 03/12/2018  . Hepatic steatosis 03/12/2018  . Aortic atherosclerosis ([place]) 03/12/2018    SALISBURY,DONNA 03/17/2018, 5:30 PM  [place], [place], [address] Phone: [phone]   Fax:  [phone]  Name: Mia [name] MRN: [id number] Date of Birth: [birth year]  Serafina Royals, PT 03/17/18 5:30 PM

## 2018-03-21 ENCOUNTER — Ambulatory Visit (HOSPITAL_COMMUNITY)
Admission: RE | Admit: 2018-03-21 | Discharge: 2018-03-21 | Disposition: A | Discharge status: Home / Self Care | Payer: BLUE CROSS/BLUE SHIELD | Source: Ambulatory Visit | Attending: Oncology | Admitting: Oncology

## 2018-03-21 DIAGNOSIS — X58XXXA Exposure to other specified factors, initial encounter: Secondary | ICD-10-CM | POA: Diagnosis not present

## 2018-03-21 DIAGNOSIS — E1169 Type 2 diabetes mellitus with other specified complication: Secondary | ICD-10-CM | POA: Diagnosis not present

## 2018-03-21 DIAGNOSIS — K760 Fatty (change of) liver, not elsewhere classified: Secondary | ICD-10-CM | POA: Diagnosis not present

## 2018-03-21 DIAGNOSIS — I700 Atherosclerosis of aorta: Secondary | ICD-10-CM | POA: Diagnosis not present

## 2018-03-21 DIAGNOSIS — G620 Drug-induced polyneuropathy: Secondary | ICD-10-CM | POA: Insufficient documentation

## 2018-03-21 DIAGNOSIS — M879 Osteonecrosis, unspecified: Secondary | ICD-10-CM | POA: Insufficient documentation

## 2018-03-21 DIAGNOSIS — C7951 Secondary malignant neoplasm of bone: Secondary | ICD-10-CM | POA: Insufficient documentation

## 2018-03-21 DIAGNOSIS — T451X5A Adverse effect of antineoplastic and immunosuppressive drugs, initial encounter: Secondary | ICD-10-CM | POA: Insufficient documentation

## 2018-03-21 DIAGNOSIS — C7800 Secondary malignant neoplasm of unspecified lung: Secondary | ICD-10-CM

## 2018-03-21 DIAGNOSIS — E669 Obesity, unspecified: Secondary | ICD-10-CM | POA: Insufficient documentation

## 2018-03-21 DIAGNOSIS — C50919 Malignant neoplasm of unspecified site of unspecified female breast: Secondary | ICD-10-CM | POA: Diagnosis not present

## 2018-03-21 MED ORDER — GADOBENATE DIMEGLUMINE 529 MG/ML IV SOLN
20.0000 mL | Freq: Once | INTRAVENOUS | Status: AC | PRN
  Administered 2018-03-21: 20 mL via INTRAVENOUS

## 2018-03-24 ENCOUNTER — Other Ambulatory Visit: Payer: Self-pay | Admitting: Oncology

## 2018-03-24 ENCOUNTER — Encounter: Payer: Self-pay | Admitting: Oncology

## 2018-03-24 NOTE — Progress Notes (Signed)
Mia [name]  [phone] [phone]     ID: Mia [name] DOB: 05-24-1956  MR#: [id number]  PPI#:[id number]  Patient Care Team: Mia Passy, MD as PCP - General (Family Medicine) Mia [name], Mia Dad, MD as Consulting Physician (Oncology) OTHER MD: Mia Austria MD, Mia Sabal PA  CHIEF COMPLAINT: Estrogen receptor positive stage IV breast cancer  CURRENT TREATMENT: Letrozole, palbociclib   HISTORY OF CURRENT ILLNESS: We have reviewed the medical records from [place], which is the source of the information below:  Mia [name] was initially diagnosed with Stage IIIA (T2N2M0) invasive ductal carcinoma, estrogen receptor positive and HER2 negative right breast cancer in 2000. She underwent right mastectomy, with 4 positive metastatic lymph nodes. She had chemotherapy with taxol and cytoxan and fulvestrant in the past. She had radiation and completed 5 years of tamoxifen.   She was diagnosed with Stage IV disease 04/18/2005 with metastases to the bone, lung, and additional nodes. She was on capecitabine but was discontinued after rising CA 27-29 and scans.   She started anastrozole and everolimus with Delton See in April 2014. She tolerated this well, but this was discontinued in July 2014 due to abnormal blood tests, hyperlipidemia and hyperglycemia.  She began Abraxane 126m /m2 and Xgeva in August 2014 weekly x3 with 1 week off. She tolerated this treatment well. She discontinued Xgeva January 2016 due to osteonecrosis of the jaw and mouth issues. She continued abraxane alone until her last dose on 11/18/2015 due to neuropathy and disease progression with restaging studies on 11/23/2015 with a CT chest abdomen and pelvis and one scan showing skull, sternal and femoral lesions.   She was switched to gemcitabine starting 01/20/2016 weekly x3 and 1 week off. This was discontinued on 06/15/2016 due to a port related jugular clot on the right  side. She was given Lovenox for 3 months.  She was switched to Letrozole 2.5 mg and palbociclib 125 mg starting 07/13/2016. She tolerated this well. Restaging studies with CT chest abdomen and pelvis and bone scan on 07/01/2017 showed stable/ improving lesions. CA 27-29 was stable.  The patient's subsequent history is as detailed below.  INTERVAL HISTORY: KLayaanwas evaluated in the breast cancer clinic on 03/12/2018.  We reviewed additional records.  During the [place] restaging studies [facility] showing: A 6 mm pulmonary nodule in the left upper lobe laterally. Progression of metastatic disease to the bones at the L4 and L5 vertebral bodies. Sclerotic metastases at T8, T10, an T11, are similar to previous exams. Sclerotic metastases in the sternum stable. Hepatic steatosis. Aortic atherosclerosis.  Most recent CCA-50-29(January 2019) was 58.  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 She also completed 10 radiation treatments in the T spine and radiation in the femur. The patient denies unusual headaches, visual changes, nausea, vomiting, stiff neck, dizziness, or gait imbalance. There has been no cough, phlegm production, or pleurisy, no chest pain or pressure, and no change in bowel or bladder habits. The patient denies fever, rash, bleeding, unexplained fatigue or unexplained weight loss. A detailed review of systems was otherwise entirely negative.   PAST MEDICAL HISTORY: Past Medical History:  Diagnosis Date  . Breast cancer metastasized to bone ([facility]   .  Hyperlipidemia   . Hypertension   . Lymphedema of right arm   . Neuropathy associated with cancer ([place])   . Obesity (BMI 35.0-39.9 without comorbidity)      PAST SURGICAL  HISTORY: Past Surgical History:  Procedure Laterality Date  . CATARACT EXTRACTION Left   . HYSTERECTOMY ABDOMINAL WITH SALPINGO-OOPHORECTOMY    . MODIFIED RADICAL MASTECTOMY Right       FAMILY HISTORY No family history on file.  The patient reports she had negative genetic testing at [facility] 2014,report not available. --The patient's father died at age 22 due to a PE, and he also had a history of colon cancer diagnosed at age 44. The patient's mother died at age 23 due to diabetes and survived a cerebral aneurysm.The patient's mother also had a history of breast cancer diagnosed at age 54.  The patient has no brothers and 1 sister. The patient's sister was diagnosed with non invasive breast cancer at age 33. There was a paternal grandmother diagnosed with breast cancer at age 84. The patient denies a family history of ovarian cancer.    GYNECOLOGIC HISTORY:  No LMP recorded. Menarche: 62 years old Age at first live birth: 62 years old She is [place] P2. She is status post hysterectomy with bilateral salpingo- oophorectomy in 2009 at about age 43.  She never used HRT.    SOCIAL HISTORY:  Mia [name] is disabled due to her breast cancer. She used to be Surveyor, quantity for a cardiology office. Her husband, Mia [name] is in the process of retiring. He is a Electrical engineer for a power company. The patient's daughter, Mia [name], lives in [place] and works as a Teaching laboratory technician. The patient's second daughter, Mia [name] is a stay at home mom and is a special needs teacher, who will soon move to [facility] The patient plans on attending [employer].      ADVANCED DIRECTIVES:    HEALTH MAINTENANCE: Social History   Tobacco Use  . Smoking status: Not on file  Substance Use Topics  . Alcohol use: Not on file  . Drug use: Not on file     Colonoscopy: 2017   PAP:  Bone density: never  Mammogram: 2017   Allergies  Allergen Reactions  .  Morphine And Related Anaphylaxis    Current Outpatient Medications  Medication Sig Dispense Refill  . amlodipine-atorvastatin (CADUET) 10-10 MG tablet Take 1 tablet by mouth daily.    Marland Kitchen aspirin 81 MG chewable tablet Chew by mouth daily.    . fenofibrate (TRICOR) 145 MG tablet Take 145 mg by mouth daily.    Marland Kitchen gabapentin (NEURONTIN) 300 MG capsule Take 300 mg by mouth 3 (three) times daily.    . Insulin Admin Supplies MISC Inject 24 Units into the skin daily.    Marland Kitchen letrozole (FEMARA) 2.5 MG tablet Take 2.5 mg by mouth daily.    Marland Kitchen losartan (COZAAR) 100 MG tablet Take 100 mg by mouth daily.    . metoprolol tartrate (LOPRESSOR) 25 MG tablet Take 25 mg by mouth daily.    Marland Kitchen oxyCODONE-acetaminophen (PERCOCET) 10-325 MG tablet Take 1 tablet by mouth 3 (three) times daily.    . palbociclib (IBRANCE) 125 MG capsule Take 125 mg by mouth daily with breakfast. Take whole with food. Take for 21 days on, 7 days off, repeat every 28 days.    . pantoprazole (PROTONIX) 40 MG tablet Take 40 mg by mouth daily.    . pioglitazone (ACTOS) 15 MG  tablet Take 15 mg by mouth daily.    . pravastatin (PRAVACHOL) 40 MG tablet Take 40 mg by mouth daily.    . sitaGLIPtin-metformin (JANUMET) 50-1000 MG tablet Take 1 tablet by mouth daily.     No current facility-administered medications for this visit.     OBJECTIVE: Middle-aged white woman who appears stated age  There were no vitals filed for this visit.   There is no height or weight on file to calculate BMI.   Wt Readings from Last 3 Encounters:  03/12/18 222 lb 1.6 oz (100.7 kg)      ECOG FS:1 - Symptomatic but completely ambulatory  Ocular: Sclerae unicteric, pupils round and equal Ear-nose-throat: Oropharynx clear and moist Lymphatic: No cervical or supraclavicular adenopathy Lungs no rales or rhonchi Heart regular rate and rhythm Abd soft, nontender, positive bowel sounds MSK no focal spinal tenderness, chronic grade 1 right upper extremity  lymphedema Neuro: non-focal, well-oriented, appropriate affect Breasts: The right breast is status post mastectomy.  There is no evidence of chest wall recurrence.  Left breast is unremarkable.  Both axillae are benign.   LAB RESULTS:  CMP     Component Value Date/Time   NA 138 03/12/2018 1544   K 4.3 03/12/2018 1544   CL 102 03/12/2018 1544   CO2 28 03/12/2018 1544   GLUCOSE 114 03/12/2018 1544   BUN 20 03/12/2018 1544   CREATININE 0.97 03/12/2018 1544   CALCIUM 9.7 03/12/2018 1544   PROT 7.1 03/12/2018 1544   ALBUMIN 3.7 03/12/2018 1544   AST 16 03/12/2018 1544   ALT 12 03/12/2018 1544   ALKPHOS 61 03/12/2018 1544   BILITOT 0.3 03/12/2018 1544   GFRNONAA >60 03/12/2018 1544   GFRAA >60 03/12/2018 1544    No results found for: TOTALPROTELP, ALBUMINELP, A1GS, A2GS, BETS, BETA2SER, GAMS, MSPIKE, SPEI  No results found for: KPAFRELGTCHN, LAMBDASER, KAPLAMBRATIO  Lab Results  Component Value Date   WBC 3.0 (L) 03/12/2018   NEUTROABS 2.0 03/12/2018   HGB 11.5 (L) 03/12/2018   HCT 35.2 03/12/2018   MCV 102.3 (H) 03/12/2018   PLT 382 03/12/2018    _0 @  No results found for: LABCA2  No components found for: FBXUXY333  No results for input(s): INR in the last 168 hours.  No results found for: LABCA2  No results found for: OVA919  No results found for: TYO060  No results found for: OKH997  Lab Results  Component Value Date   CA2729 72.3 (H) 03/12/2018    No components found for: HGQUANT  No results found for: CEA1 / No results found for: CEA1   No results found for: AFPTUMOR  No results found for: CHROMOGRNA  No results found for: PSA1  No visits with results within 3 Day(s) from this visit.  Latest known visit with results is:  Appointment on 03/12/2018  Component Date Value Ref Range Status  . CA 27.29 03/12/2018 72.3* 0.0 - 38.6 U/mL Final   Comment: (NOTE) Siemens Centaur Immunochemiluminometric Methodology [facility]) Values obtained  with different assay methods or kits cannot be used interchangeably. Results cannot be interpreted as absolute evidence of the presence or absence of malignant disease. Performed At: [facility] [place], [place] [id number] Rush Farmer MD UY:[id number] Performed at [facility], [place] [address], [place], [place] [address]   . Sodium 03/12/2018 138  136 - 145 mmol/L Final  . Potassium 03/12/2018 4.3  3.5 - 5.1 mmol/L Final  . Chloride 03/12/2018 102  98 - 109 mmol/L Final  . CO2 03/12/2018 28  22 - 29 mmol/L Final  . Glucose, Bld 03/12/2018 114  70 - 140 mg/dL Final  . BUN 03/12/2018 20  7 - 26 mg/dL Final  . Creatinine, Ser 03/12/2018 0.97  0.60 - 1.10 mg/dL Final  . Calcium 03/12/2018 9.7  8.4 - 10.4 mg/dL Final  . Total Protein 03/12/2018 7.1  6.4 - 8.3 g/dL Final  . Albumin 03/12/2018 3.7  3.5 - 5.0 g/dL Final  . AST 03/12/2018 16  5 - 34 U/L Final  . ALT 03/12/2018 12  0 - 55 U/L Final  . Alkaline Phosphatase 03/12/2018 61  40 - 150 U/L Final  . Total Bilirubin 03/12/2018 0.3  0.2 - 1.2 mg/dL Final  . GFR calc non Af Amer 03/12/2018 >60  >60 mL/min Final  . GFR calc Af Amer 03/12/2018 >60  >60 mL/min Final   Comment: (NOTE) The eGFR has been calculated using the CKD EPI equation. This calculation has not been validated in all clinical situations. eGFR's persistently <60 mL/min signify possible Chronic Kidney Disease.   Georgiann Hahn gap 03/12/2018 8  3 - 11 Final   Performed at [facility], [place] [address], [place], [place] [address]  . WBC 03/12/2018 3.0* 3.9 - 10.3 K/uL Final  . RBC 03/12/2018 3.44* 3.70 - 5.45 MIL/uL Final  . Hemoglobin 03/12/2018 11.5* 11.6 - 15.9 g/dL Final  . HCT 03/12/2018 35.2  34.8 - 46.6 % Final  . MCV 03/12/2018 102.3* 79.5 - 101.0 fL Final  . MCH 03/12/2018 33.4  25.1 - 34.0 pg Final  . MCHC 03/12/2018 32.7  31.5 - 36.0 g/dL Final  . RDW 03/12/2018 15.1* 11.2 - 14.5 % Final   . Platelets 03/12/2018 382  145 - 400 K/uL Final  . Neutrophils Relative % 03/12/2018 66  % Final  . Neutro Abs 03/12/2018 2.0  1.5 - 6.5 K/uL Final  . Lymphocytes Relative 03/12/2018 26  % Final  . Lymphs Abs 03/12/2018 0.8* 0.9 - 3.3 K/uL Final  . Monocytes Relative 03/12/2018 5  % Final  . Monocytes Absolute 03/12/2018 0.1  0.1 - 0.9 K/uL Final  . Eosinophils Relative 03/12/2018 1  % Final  . Eosinophils Absolute 03/12/2018 0.0  0.0 - 0.5 K/uL Final  . Basophils Relative 03/12/2018 2  % Final  . Basophils Absolute 03/12/2018 0.1  0.0 - 0.1 K/uL Final   Performed at [facility], [place] [address] [place], [place] [address]    (this displays the last labs from the last 3 days)  No results found for: TOTALPROTELP, ALBUMINELP, A1GS, A2GS, BETS, BETA2SER, GAMS, MSPIKE, SPEI (this displays SPEP labs)  No results found for: KPAFRELGTCHN, LAMBDASER, KAPLAMBRATIO (kappa/lambda light chains)  No results found for: HGBA, HGBA2QUANT, HGBFQUANT, HGBSQUAN (Hemoglobinopathy evaluation)   No results found for: LDH  No results found for: IRON, TIBC, IRONPCTSAT (Iron and TIBC)  No results found for: FERRITIN  Urinalysis No results found for: COLORURINE, APPEARANCEUR, LABSPEC, PHURINE, GLUCOSEU, HGBUR, BILIRUBINUR, KETONESUR, PROTEINUR, UROBILINOGEN, NITRITE, LEUKOCYTESUR   STUDIES: Outside studies reviewed  ELIGIBLE FOR AVAILABLE RESEARCH PROTOCOL: *no  ASSESSMENT: 62 y.o. [place], [place] woman with stage IV breast cancer, as follows:  (1) status post right mastectomy in 2000, for a pT2 pN2, stage IIIA invasive ductal carcinoma, estrogen receptor positive, progesterone receptor not tested, HER-2 negative (0) by immunohistochemistry  (a) adjuvant chemotherapy with doxorubicin and cyclophosphamide in dose dense fashion x4 followed by paclitaxel  in dose dense fashion x4  (b) adjuvant radiation: 30 doses  (c) antiestrogens: Tamoxifen for 5 years, completed  2005  (2) METASTASTIC DISEASE: 2008, involving bones, lungs, and lymph nodes  (a) CA 27-29 informative  (b) CT of the chest abdomen and pelvis in [facility] fine stable sclerotic metastases (T8, T10, T11, sternum, L4, L5; 0.6 cm left upper lobe lung nodule stable  (3)  prior anti-estrogen treatments:  (a) fulvestrant--progression  (b) exemestane/everolimus--hyperlipidemia, hyperglycemia  (4) prior chemotherapy treatments:  (a) capecitabine: progression  (b) Abraxane, August 2014 through 11/18/2015: good response but stopped due to neuropathy  (c) gemcitabine 01/20/2016--with multiple interruptions secondary to infections  (5) radiation therapy:  (a) T spine an Right femur, completed 01/10/2016  (6) letrozole/ palbociclib started 07/13/2016  (7) bone treatment:  (a) denosumab/Xgeva--discontinued January 2016 due to osteonecrosis of the jaw  (8) cancer associated pain:  (a) currently on oxycodone/APAP 10/325 QID with no recent change in dose  PLAN: We spent the better part of today's hour-long appointment discussing the biology of her diagnosis and the specifics of Mia [name]'s situation. She understands that stage IV breast cancer is not curable with our current knowledge base. The goal of treatment is control. The strategy of treatment is to do only the minimum necessary to control the growth of the tumor so that the patient can have as normal a life as possible. There is no survival advantage in treating aggressively if treating less aggressively results in tumor control. With this strategy stage IV breast cancer in many cases can function as a "chronic illness": something that cannot be quite gotten rid of but can be controlled for an indefinite period of time--and in fact she is an excellent example of this.  Mia [name] has been on her current treatment, letrozole plus palbociclib, for a little over a year and a half.  She tolerates this well.  She has not required any dose  reduction in the palbociclib.  She does not have disabling symptoms related to the anti-estrogen.  Accordingly we will continue the current therapy until there is evidence of disease progression or intolerance  The most recent CT scan suggested progression in the lumbar area.  She has not had radiation to this area, which is an option.  We can also discuss resuming denosumab/Xgeva pending further dental evaluation.  We will obtain a total spinal MRI which will serve as a new baseline.  Of course we are also repeating the CA-27-29.  If and when she develops visceral disease we will obtain PD-L1 and foundation one testing.  She would benefit from referral to our physical therapy group for lymphedema management.  Also she is deconditioned and I think would benefit from the live strong program and our tai chi program and she has been given the appropriate information.  She will see me again 04/25/2018.  From that point very likely we will see her every 2 months at least initially.  Mia [name] has a good understanding of the overall plan. She agrees with it. She knows the goal of treatment in her case is control. She will call with any problems that may develop before her next visit here.

## 2018-04-08 ENCOUNTER — Telehealth: Payer: Self-pay | Admitting: Pharmacist

## 2018-04-08 DIAGNOSIS — C50919 Malignant neoplasm of unspecified site of unspecified female breast: Secondary | ICD-10-CM

## 2018-04-08 MED ORDER — PALBOCICLIB 125 MG PO CAPS: 125.0000 mg | ORAL_CAPSULE | Freq: Every day | ORAL | 6 refills | Status: DC

## 2018-04-08 NOTE — Telephone Encounter (Signed)
Oral Chemotherapy Pharmacist Encounter   I spoke with patient for overview of continued Ibrance.   Counseled patient on administration, dosing, side effects, monitoring, drug-food interactions, safe handling, storage, and disposal.  Patient will continue taking Ibrance 125mg  capsules, 1 capsule by mouth once daily with breakfast for 3 weeks on, 1 week off.  Patient knows to avoid grapefruit and grapefruit juice.  Ibrance start date: 07/13/2016  Adverse effects include but are not limited to: fatigue, hair loss, GI upset, nausea, decreased blood counts, and increased upper respiratory infections. Patient has anti diarrheal and will alert the office of 4 or more loose stools above baseline.  Reviewed with patient importance of keeping a medication schedule and plan for any missed doses.  Ms. Sprowl voiced understanding and appreciation.   All questions answered. Medication reconciliation performed and medication/allergy list updated.  Patient with [employer] prescription insurance coverage. Ibrance prescriptions refilled at [facility] per insurance requirement. New prescription for Ibrance originating from Dr. [name] E scribed to [facility]. We will follow-up with dispensing pharmacy in 48 hours to ensure continued prescription processing.  Follow-up in the office on 04/25/2018 confirmed with patient. Patient confirmed she has 1 bottle of Ibrance remaining. She states she is able to afford her Ibrance at this time.  Patient knows to call the office with questions or concerns. Oral Oncology Clinic will continue to follow.  Thank you,  Johny Drilling, PharmD, BCPS, BCOP  04/08/2018   10:27 AM Oral Oncology Clinic [phone]

## 2018-04-09 NOTE — Telephone Encounter (Signed)
Oral Oncology Pharmacist Encounter  I called [facility] at [phone] to follow-up on the status of new Ibrance prescription sent from Dr. Virgie Dad office.  They have received new prescription. They have discontinued all prescription from different provider.  Last [facility] fill was shipped to the patient on 03/21/2018. They will reach out to the patient in ~ 10 more days to schedule next shipment of Ibrance.  No additional needs from the office at this time.  Johny Drilling, PharmD, BCPS, BCOP  04/09/2018 2:01 PM Oral Oncology Clinic [phone]

## 2018-04-22 ENCOUNTER — Inpatient Hospital Stay: Payer: MEDICARE | Attending: Oncology

## 2018-04-22 DIAGNOSIS — C50919 Malignant neoplasm of unspecified site of unspecified female breast: Secondary | ICD-10-CM

## 2018-04-22 DIAGNOSIS — Z170 Estrogen receptor positive status [ER+]: Secondary | ICD-10-CM | POA: Diagnosis not present

## 2018-04-22 DIAGNOSIS — C7801 Secondary malignant neoplasm of right lung: Secondary | ICD-10-CM | POA: Insufficient documentation

## 2018-04-22 DIAGNOSIS — C50911 Malignant neoplasm of unspecified site of right female breast: Secondary | ICD-10-CM | POA: Insufficient documentation

## 2018-04-22 DIAGNOSIS — Z9221 Personal history of antineoplastic chemotherapy: Secondary | ICD-10-CM | POA: Diagnosis not present

## 2018-04-22 DIAGNOSIS — Z9012 Acquired absence of left breast and nipple: Secondary | ICD-10-CM | POA: Diagnosis not present

## 2018-04-22 DIAGNOSIS — Z794 Long term (current) use of insulin: Secondary | ICD-10-CM | POA: Diagnosis not present

## 2018-04-22 DIAGNOSIS — I700 Atherosclerosis of aorta: Secondary | ICD-10-CM | POA: Diagnosis not present

## 2018-04-22 DIAGNOSIS — C779 Secondary and unspecified malignant neoplasm of lymph node, unspecified: Secondary | ICD-10-CM | POA: Insufficient documentation

## 2018-04-22 DIAGNOSIS — C7800 Secondary malignant neoplasm of unspecified lung: Secondary | ICD-10-CM

## 2018-04-22 DIAGNOSIS — K760 Fatty (change of) liver, not elsewhere classified: Secondary | ICD-10-CM | POA: Diagnosis not present

## 2018-04-22 DIAGNOSIS — G629 Polyneuropathy, unspecified: Secondary | ICD-10-CM | POA: Diagnosis not present

## 2018-04-22 DIAGNOSIS — Z90710 Acquired absence of both cervix and uterus: Secondary | ICD-10-CM | POA: Insufficient documentation

## 2018-04-22 DIAGNOSIS — R232 Flushing: Secondary | ICD-10-CM | POA: Insufficient documentation

## 2018-04-22 DIAGNOSIS — Z7982 Long term (current) use of aspirin: Secondary | ICD-10-CM | POA: Insufficient documentation

## 2018-04-22 DIAGNOSIS — Z90722 Acquired absence of ovaries, bilateral: Secondary | ICD-10-CM | POA: Insufficient documentation

## 2018-04-22 DIAGNOSIS — G893 Neoplasm related pain (acute) (chronic): Secondary | ICD-10-CM | POA: Diagnosis not present

## 2018-04-22 DIAGNOSIS — E785 Hyperlipidemia, unspecified: Secondary | ICD-10-CM | POA: Insufficient documentation

## 2018-04-22 DIAGNOSIS — Z803 Family history of malignant neoplasm of breast: Secondary | ICD-10-CM | POA: Insufficient documentation

## 2018-04-22 DIAGNOSIS — E669 Obesity, unspecified: Secondary | ICD-10-CM | POA: Insufficient documentation

## 2018-04-22 DIAGNOSIS — C7951 Secondary malignant neoplasm of bone: Secondary | ICD-10-CM | POA: Diagnosis not present

## 2018-04-22 DIAGNOSIS — Z79899 Other long term (current) drug therapy: Secondary | ICD-10-CM | POA: Insufficient documentation

## 2018-04-22 DIAGNOSIS — Z79811 Long term (current) use of aromatase inhibitors: Secondary | ICD-10-CM | POA: Diagnosis not present

## 2018-04-22 DIAGNOSIS — M879 Osteonecrosis, unspecified: Secondary | ICD-10-CM

## 2018-04-22 DIAGNOSIS — I10 Essential (primary) hypertension: Secondary | ICD-10-CM | POA: Insufficient documentation

## 2018-04-22 LAB — CBC WITH DIFFERENTIAL/PLATELET
BASOS ABS: 0 10*3/uL (ref 0.0–0.1)
Basophils Relative: 2 %
Eosinophils Absolute: 0 10*3/uL (ref 0.0–0.5)
Eosinophils Relative: 1 %
HEMATOCRIT: 34.5 % — AB (ref 34.8–46.6)
Hemoglobin: 11.5 g/dL — ABNORMAL LOW (ref 11.6–15.9)
LYMPHS ABS: 0.6 10*3/uL — AB (ref 0.9–3.3)
LYMPHS PCT: 21 %
MCH: 34.2 pg — AB (ref 25.1–34.0)
MCHC: 33.3 g/dL (ref 31.5–36.0)
MCV: 102.5 fL — AB (ref 79.5–101.0)
MONO ABS: 0.3 10*3/uL (ref 0.1–0.9)
Monocytes Relative: 10 %
NEUTROS ABS: 1.7 10*3/uL (ref 1.5–6.5)
Neutrophils Relative %: 66 %
Platelets: 201 10*3/uL (ref 145–400)
RBC: 3.36 MIL/uL — AB (ref 3.70–5.45)
RDW: 16.5 % — ABNORMAL HIGH (ref 11.2–14.5)
WBC: 2.6 10*3/uL — AB (ref 3.9–10.3)

## 2018-04-22 LAB — COMPREHENSIVE METABOLIC PANEL
ALBUMIN: 3.7 g/dL (ref 3.5–5.0)
ALT: 17 U/L (ref 0–44)
ANION GAP: 7 (ref 5–15)
AST: 17 U/L (ref 15–41)
Alkaline Phosphatase: 65 U/L (ref 38–126)
BILIRUBIN TOTAL: 0.3 mg/dL (ref 0.3–1.2)
BUN: 12 mg/dL (ref 8–23)
CHLORIDE: 103 mmol/L (ref 98–111)
CO2: 28 mmol/L (ref 22–32)
Calcium: 10 mg/dL (ref 8.9–10.3)
Creatinine, Ser: 0.81 mg/dL (ref 0.44–1.00)
GFR calc Af Amer: >60 mL/min (ref >60)
GFR calc non Af Amer: >60 mL/min (ref >60)
GLUCOSE: 136 mg/dL — AB (ref 70–99)
Potassium: 4.7 mmol/L (ref 3.5–5.1)
Sodium: 138 mmol/L (ref 135–145)
TOTAL PROTEIN: 7.2 g/dL (ref 6.5–8.1)

## 2018-04-23 LAB — CANCER ANTIGEN 27.29: CA 27.29: 80.4 U/mL — ABNORMAL HIGH (ref 0.0–38.6)

## 2018-04-24 NOTE — Progress Notes (Signed)
[facility]  [phone] [phone]     ID: Mia [name] DOB: 1956-08-01  MR#: [id number]  POE#:[id number]  Patient Care Team: Lucille Passy, MD as PCP - General (Family Medicine) Kaysea Raya, Virgie Dad, MD as Consulting Physician (Oncology) [name], [name] PA  CHIEF COMPLAINT: Estrogen receptor positive stage IV breast cancer  CURRENT TREATMENT: Letrozole, palbociclib   HISTORY OF CURRENT ILLNESS: We have reviewed the medical records from Mia [name], which is the source of the information below:  Lorretta Kerce was initially diagnosed with Stage IIIA (T2N2M0) invasive ductal carcinoma, estrogen receptor positive and HER2 negative right breast cancer in 2000. She underwent right mastectomy, with 4 positive metastatic lymph nodes. She had chemotherapy with taxol and cytoxan and fulvestrant in the past. She had radiation and completed 5 years of tamoxifen.   She was diagnosed with Stage IV disease 04/18/2005 with metastases to the bone, lung, and additional nodes. She was on capecitabine but was discontinued after rising CA 27-29 and scans.   She started anastrozole and everolimus with Delton See in April 2014. She tolerated this well, but this was discontinued in July 2014 due to abnormal blood tests, hyperlipidemia and hyperglycemia.  She began Abraxane '100mg'$  /m2 and Xgeva in August 2014 weekly x3 with 1 week off. She tolerated this treatment well. She discontinued Xgeva January 2016 due to osteonecrosis of the jaw and mouth issues. She continued abraxane alone until her last dose on 11/18/2015 due to neuropathy and disease progression with restaging studies on 11/23/2015 with a CT chest abdomen and pelvis and one scan showing skull, sternal and femoral lesions.   She was switched to gemcitabine starting 01/20/2016 weekly x3 and 1 week off. This was discontinued on 06/15/2016 due to a port related jugular clot on the right  side. She was given Lovenox for 3 months.  She was switched to Letrozole 2.5 mg and palbociclib 125 mg starting 07/13/2016. She tolerated this well. Restaging studies with CT chest abdomen and pelvis and bone scan on 07/01/2017 showed stable/ improving lesions. CA 27-29 was stable.  During the last few months of follow up in [place], she completed restaging studies with a CT chest abdomen and pelvis at [facility] on 12/17/2017 showing: A 6 mm pulmonary nodule in the left upper lobe laterally. Progression of metastatic disease to the bones at the L4 and L5 vertebral bodies. Sclerotic metastases at T8, T10, an T11, are similar to previous exams. Sclerotic metastases in the sternum stable. Hepatic steatosis. Aortic atherosclerosis.  Most recent CA-12-29 (January 2019) was 58.  Most recent hemoglobin A1c was 7.1 according to the patient  The patient's subsequent history is as detailed below.  INTERVAL HISTORY: Mia [name] returns today for follow up and treatment of her metastatic estrogen receptor positive breast cancer. She continues on letrozole, with good tolerance. She has regular hot flashes, occurring 2-3 times on a "bad" day. She denies issues with vaginal dryness.    She also takes palbociclib, currently at 125 mg daily 21 days on and 7 days off, with good tolerance. She starts day 1 on 04/26/2018. She feels fatigued. She has occasional nausea with some cycles.   Since her last visit, she completed a spinal MRI on 03/21/2018 showing: MRI cervicothoracic spine: Nonenhancing T1 and T8 metastasis. No pathologic fracture. MRI lumbar spine: Multiple enhancing and nonenhancing metastasis. No pathologic fracture. No canal stenosis.  Mild LEFT L4-5 neural foraminal narrowing   REVIEW OF SYSTEMS: Asami reports that she  takes about 3-4 percocet per day for pain. She denies constipation. Her pain increases if she is more active with cleaning, but the pain resolves the next day. She gets SOB quickly so she  isn't able to exercise regularly. She has not had recent left mammography. She denies unusual headaches, visual changes, vomiting, or dizziness. There has been no unusual cough, phlegm production, or pleurisy. This been no change in bowel or bladder habits. She denies unexplained weight loss, bleeding, rash, or fever. A detailed review of systems was otherwise stable.   PAST MEDICAL HISTORY: Past Medical History:  Diagnosis Date  . Breast cancer metastasized to bone ([place])   . Hyperlipidemia   . Hypertension   . Lymphedema of right arm   . Neuropathy associated with cancer ([place])   . Obesity (BMI 35.0-39.9 without comorbidity)      PAST SURGICAL HISTORY: Past Surgical History:  Procedure Laterality Date  . CATARACT EXTRACTION Left   . HYSTERECTOMY ABDOMINAL WITH SALPINGO-OOPHORECTOMY    . MODIFIED RADICAL MASTECTOMY Right       FAMILY HISTORY No family history on file.  The patient reports she had negative genetic testing at [facility] 2014,report not available. --The patient's father died at age 21 due to a PE, and he also had a history of colon cancer diagnosed at age 12. The patient's mother died at age 58 due to diabetes and survived a cerebral aneurysm.The patient's mother also had a history of breast cancer diagnosed at age 73.  The patient has no brothers and 1 sister. The patient's sister was diagnosed with non invasive breast cancer at age 9. There was a paternal grandmother diagnosed with breast cancer at age 35. The patient denies a family history of ovarian cancer.    GYNECOLOGIC HISTORY:  No LMP recorded. Menarche: 62 years old Age at first live birth: 62 years old She is [place] P2. She is status post hysterectomy with bilateral salpingo- oophorectomy in 2009 at about age 47.  She never used HRT.    SOCIAL HISTORY:  Meha is disabled due to her breast cancer. She used to be Surveyor, quantity for a cardiology office. Her husband, [name] is in the process of  retiring. He is a Electrical engineer for a power company. The patient's daughter, Mia [name], lives in [place] and works as a Teaching laboratory technician. The patient's second daughter, [name] is a stay at home mom and is a special needs teacher, who will soon move to [facility]). The patient plans on attending [employer].      ADVANCED DIRECTIVES:    HEALTH MAINTENANCE: Social History   Tobacco Use  . Smoking status: Not on file  Substance Use Topics  . Alcohol use: Not on file  . Drug use: Not on file     Colonoscopy: 2017   PAP:  Bone density: never  Mammogram: 2017   Allergies  Allergen Reactions  . Morphine And Related Anaphylaxis    Current Outpatient Medications  Medication Sig Dispense Refill  . amlodipine-atorvastatin (CADUET) 10-10 MG tablet Take 1 tablet by mouth daily.    Marland Kitchen aspirin 81 MG chewable tablet Chew by mouth daily.    . fenofibrate (TRICOR) 145 MG tablet Take 145 mg by mouth daily.    Marland Kitchen gabapentin (NEURONTIN) 300 MG capsule Take 300 mg by mouth 3 (three) times daily.    . Insulin Admin Supplies MISC Inject 24 Units into the skin daily.    Marland Kitchen letrozole [facility]) 2.5  MG tablet Take 2.5 mg by mouth daily.    Marland Kitchen losartan (COZAAR) 100 MG tablet Take 100 mg by mouth daily.    . metoprolol tartrate (LOPRESSOR) 25 MG tablet Take 25 mg by mouth daily.    Marland Kitchen oxyCODONE-acetaminophen (PERCOCET) 10-325 MG tablet Take 1 tablet by mouth 3 (three) times daily.    . palbociclib (IBRANCE) 125 MG capsule Take 1 capsule (125 mg total) by mouth daily with breakfast. Take whole with food. Take for 21 days on, 7 days off, repeat every 28 days. 21 capsule 6  . pantoprazole (PROTONIX) 40 MG tablet Take 40 mg by mouth daily.    . pioglitazone (ACTOS) 15 MG tablet Take 15 mg by mouth daily.    . pravastatin (PRAVACHOL) 40 MG tablet Take 40 mg by mouth daily.    . sitaGLIPtin-metformin (JANUMET) 50-1000 MG tablet Take 1 tablet by mouth daily.     No current  facility-administered medications for this visit.     OBJECTIVE: Middle-aged white woman in no acute distress  Vitals:   04/25/18 0924  BP: (!) 148/80  Pulse: 80  Resp: 17  Temp: 98.1 F (36.7 C)  SpO2: 100%     Body mass index is 37.8 kg/m.   Wt Readings from Last 3 Encounters:  04/25/18 218 lb 8 oz (99.1 kg)  03/12/18 222 lb 1.6 oz (100.7 kg)      ECOG FS:1 - Symptomatic but completely ambulatory  Sclerae unicteric, EOMs intact Oropharynx clear and moist No cervical or supraclavicular adenopathy Lungs no rales or rhonchi Heart regular rate and rhythm Abd soft, nontender, positive bowel sounds MSK no focal spinal tenderness, no upper extremity lymphedema Neuro: nonfocal, well oriented, appropriate affect Breasts: Status post right mastectomy.  LAB RESULTS:  CMP     Component Value Date/Time   NA 138 04/22/2018 1153   K 4.7 04/22/2018 1153   CL 103 04/22/2018 1153   CO2 28 04/22/2018 1153   GLUCOSE 136 (H) 04/22/2018 1153   BUN 12 04/22/2018 1153   CREATININE 0.81 04/22/2018 1153   CALCIUM 10.0 04/22/2018 1153   PROT 7.2 04/22/2018 1153   ALBUMIN 3.7 04/22/2018 1153   AST 17 04/22/2018 1153   ALT 17 04/22/2018 1153   ALKPHOS 65 04/22/2018 1153   BILITOT 0.3 04/22/2018 1153   GFRNONAA >60 04/22/2018 1153   GFRAA >60 04/22/2018 1153    No results found for: TOTALPROTELP, ALBUMINELP, A1GS, A2GS, BETS, BETA2SER, GAMS, MSPIKE, SPEI  No results found for: KPAFRELGTCHN, LAMBDASER, KAPLAMBRATIO  Lab Results  Component Value Date   WBC 2.6 (L) 04/22/2018   NEUTROABS 1.7 04/22/2018   HGB 11.5 (L) 04/22/2018   HCT 34.5 (L) 04/22/2018   MCV 102.5 (H) 04/22/2018   PLT 201 04/22/2018    '@LASTCHEMISTRY'$ @  No results found for: LABCA2  No components found for: LEXNTZ001  No results for input(s): INR in the last 168 hours.  No results found for: LABCA2  No results found for: VCB449  No results found for: QPR916  No results found for: BWG665  Lab  Results  Component Value Date   CA2729 80.4 (H) 04/22/2018    No components found for: HGQUANT  No results found for: CEA1 / No results found for: CEA1   No results found for: AFPTUMOR  No results found for: CHROMOGRNA  No results found for: PSA1  No visits with results within 3 Day(s) from this visit.  Latest known visit with results is:  Appointment on 04/22/2018  Component Date Value Ref Range Status  . CA 27.29 04/22/2018 80.4* 0.0 - 38.6 U/mL Final   Comment: (NOTE) Siemens Centaur Immunochemiluminometric Methodology [facility]) Values obtained with different assay methods or kits cannot be used interchangeably. Results cannot be interpreted as absolute evidence of the presence or absence of malignant disease. Performed At: [facility] [place], [place] [id number] Rush Farmer MD GG:[id number]   . Sodium 04/22/2018 138  135 - 145 mmol/L Final  . Potassium 04/22/2018 4.7  3.5 - 5.1 mmol/L Final  . Chloride 04/22/2018 103  98 - 111 mmol/L Final  . CO2 04/22/2018 28  22 - 32 mmol/L Final  . Glucose, Bld 04/22/2018 136* 70 - 99 mg/dL Final  . BUN 04/22/2018 12  8 - 23 mg/dL Final  . Creatinine, Ser 04/22/2018 0.81  0.44 - 1.00 mg/dL Final  . Calcium 04/22/2018 10.0  8.9 - 10.3 mg/dL Final  . Total Protein 04/22/2018 7.2  6.5 - 8.1 g/dL Final  . Albumin 04/22/2018 3.7  3.5 - 5.0 g/dL Final  . AST 04/22/2018 17  15 - 41 U/L Final  . ALT 04/22/2018 17  0 - 44 U/L Final  . Alkaline Phosphatase 04/22/2018 65  38 - 126 U/L Final  . Total Bilirubin 04/22/2018 0.3  0.3 - 1.2 mg/dL Final  . GFR calc non Af Amer 04/22/2018 >60  >60 mL/min Final  . GFR calc Af Amer 04/22/2018 >60  >60 mL/min Final   Comment: (NOTE) The eGFR has been calculated using the CKD EPI equation. This calculation has not been validated in all clinical situations. eGFR's persistently <60 mL/min signify possible Chronic Kidney Disease.   Georgiann Hahn gap 04/22/2018 7  5 - 15 Final    Performed at [facility], [place] [address] [place], [place] [address]  . WBC 04/22/2018 2.6* 3.9 - 10.3 K/uL Final  . RBC 04/22/2018 3.36* 3.70 - 5.45 MIL/uL Final  . Hemoglobin 04/22/2018 11.5* 11.6 - 15.9 g/dL Final  . HCT 04/22/2018 34.5* 34.8 - 46.6 % Final  . MCV 04/22/2018 102.5* 79.5 - 101.0 fL Final  . MCH 04/22/2018 34.2* 25.1 - 34.0 pg Final  . MCHC 04/22/2018 33.3  31.5 - 36.0 g/dL Final  . RDW 04/22/2018 16.5* 11.2 - 14.5 % Final  . Platelets 04/22/2018 201  145 - 400 K/uL Final  . Neutrophils Relative % 04/22/2018 66  % Final  . Neutro Abs 04/22/2018 1.7  1.5 - 6.5 K/uL Final  . Lymphocytes Relative 04/22/2018 21  % Final  . Lymphs Abs 04/22/2018 0.6* 0.9 - 3.3 K/uL Final  . Monocytes Relative 04/22/2018 10  % Final  . Monocytes Absolute 04/22/2018 0.3  0.1 - 0.9 K/uL Final  . Eosinophils Relative 04/22/2018 1  % Final  . Eosinophils Absolute 04/22/2018 0.0  0.0 - 0.5 K/uL Final  . Basophils Relative 04/22/2018 2  % Final  . Basophils Absolute 04/22/2018 0.0  0.0 - 0.1 K/uL Final   Performed at [facility], [place] [address] [place], [place] [address]    (this displays the last labs from the last 3 days)  No results found for: TOTALPROTELP, ALBUMINELP, A1GS, A2GS, BETS, BETA2SER, GAMS, MSPIKE, SPEI (this displays SPEP labs)  No results found for: KPAFRELGTCHN, LAMBDASER, KAPLAMBRATIO (kappa/lambda light chains)  No results found for: HGBA, HGBA2QUANT, HGBFQUANT, HGBSQUAN (Hemoglobinopathy evaluation)   No results found for: LDH  No results found for: IRON, TIBC, IRONPCTSAT (Iron and TIBC)  No  results found for: FERRITIN  Urinalysis No results found for: COLORURINE, APPEARANCEUR, LABSPEC, PHURINE, GLUCOSEU, HGBUR, BILIRUBINUR, KETONESUR, PROTEINUR, UROBILINOGEN, NITRITE, LEUKOCYTESUR   STUDIES: Since her last visit, she completed a spinal MRI on 03/21/2018 showing: MRI cervicothoracic spine: Nonenhancing T1 and  T8 metastasis.  No pathologic fracture. MRI lumbar spine: Multiple enhancing and nonenhancing metastasis. No pathologic fracture. No canal stenosis.  Mild LEFT L4-5 neural foraminal narrowing  ELIGIBLE FOR AVAILABLE RESEARCH PROTOCOL: *no  ASSESSMENT: 62 y.o. [place], [place] woman with stage IV breast cancer, as follows:  (1) status post right mastectomy in 2000, for a pT2 pN2, stage IIIA invasive ductal carcinoma, estrogen receptor positive, progesterone receptor not tested, HER-2 negative (0) by immunohistochemistry  (a) adjuvant chemotherapy with doxorubicin and cyclophosphamide in dose dense fashion x4 followed by paclitaxel in dose dense fashion x4  (b) adjuvant radiation: 30 doses  (c) antiestrogens: Tamoxifen for 5 years, completed 2005  (2) METASTASTIC DISEASE: 2008, involving bones, lungs, and lymph nodes  (a) CA 27-29 informative  (b) CT of the chest abdomen and pelvis in [facility] finds stable sclerotic metastases (T8, T10, T11, sternum, L4, L5; 0.6 cm left upper lobe lung nodule stable  (3)  prior anti-estrogen treatments:  (a) fulvestrant--progression  (b) exemestane/everolimus--hyperlipidemia, hyperglycemia  (4) prior chemotherapy treatments:  (a) capecitabine: progression  (b) Abraxane, August 2014 through 11/18/2015: good response but stopped due to neuropathy  (c) gemcitabine 01/20/2016--with multiple interruptions secondary to infections  (5) radiation therapy:  (a) T spine an Right femur, completed 01/10/2016  (6) letrozole/ palbociclib started 07/13/2016  (7) bone treatment:  (a) denosumab/Xgeva--discontinued January 2016 due to osteonecrosis of the jaw  (8) cancer associated pain:  (a) currently on oxycodone/APAP 10/325 QID with no recent change in dose  PLAN:  Arzu is tolerating the fulvestrant and Ibrance without any unusual side effects.  Her [place] today is excellent and we are making no changes in her systemic treatment.  We discussed the  total spine MRI in detail today.  The area where she had prior radiation is quiesced since.  Of course in the lower area there are some active lesions.  She understands that we cannot interpret this very easily but now we do have a baseline to follow and the plan is to continue current treatment and then repeat the total spinal MRI early November.  That order has already been placed.  I am moving her lab work to Thursdays so her next labs will be on May 22, 2018.  She will check with [place] in the office without an appointment to make sure that her [place] is okay and so I can refill her Percocet prescription.  She will then see my 63 assistant in 8 weeks.  She will not see anyone in October.  She will see me again with her November check and it will be just before that visit that she gets her MRI  She has not had a left mammogram in 2 years and I am setting her up for that next week  At this point I am pleased that she is doing as well as she is.  She knows to call for any other issues that may develop before the next visit.  Weslyn Holsonback, Virgie Dad, MD  04/25/18 9:54 AM Medical Oncology and Hematology [facility] [address] [place], [place] [address] Tel. [phone]    Fax. [phone]  Alice Rieger, am acting as scribe for Chauncey Cruel MD.  I, Lurline Del MD,  have reviewed the above documentation for accuracy and completeness, and I agree with the above.

## 2018-04-25 ENCOUNTER — Telehealth: Payer: Self-pay | Admitting: Oncology

## 2018-04-25 ENCOUNTER — Inpatient Hospital Stay (HOSPITAL_BASED_OUTPATIENT_CLINIC_OR_DEPARTMENT_OTHER): Payer: MEDICARE | Admitting: Oncology

## 2018-04-25 VITALS — BP 148/80 | HR 80 | Temp 98.1°F | Resp 17 | Ht 63.75 in | Wt 218.5 lb

## 2018-04-25 DIAGNOSIS — G893 Neoplasm related pain (acute) (chronic): Secondary | ICD-10-CM | POA: Diagnosis not present

## 2018-04-25 DIAGNOSIS — Z7982 Long term (current) use of aspirin: Secondary | ICD-10-CM

## 2018-04-25 DIAGNOSIS — C7801 Secondary malignant neoplasm of right lung: Secondary | ICD-10-CM

## 2018-04-25 DIAGNOSIS — Z170 Estrogen receptor positive status [ER+]: Secondary | ICD-10-CM | POA: Diagnosis not present

## 2018-04-25 DIAGNOSIS — C50911 Malignant neoplasm of unspecified site of right female breast: Secondary | ICD-10-CM

## 2018-04-25 DIAGNOSIS — Z79899 Other long term (current) drug therapy: Secondary | ICD-10-CM

## 2018-04-25 DIAGNOSIS — G629 Polyneuropathy, unspecified: Secondary | ICD-10-CM

## 2018-04-25 DIAGNOSIS — Z90722 Acquired absence of ovaries, bilateral: Secondary | ICD-10-CM

## 2018-04-25 DIAGNOSIS — C779 Secondary and unspecified malignant neoplasm of lymph node, unspecified: Secondary | ICD-10-CM | POA: Diagnosis not present

## 2018-04-25 DIAGNOSIS — Z90710 Acquired absence of both cervix and uterus: Secondary | ICD-10-CM

## 2018-04-25 DIAGNOSIS — C7800 Secondary malignant neoplasm of unspecified lung: Secondary | ICD-10-CM

## 2018-04-25 DIAGNOSIS — K760 Fatty (change of) liver, not elsewhere classified: Secondary | ICD-10-CM

## 2018-04-25 DIAGNOSIS — I700 Atherosclerosis of aorta: Secondary | ICD-10-CM

## 2018-04-25 DIAGNOSIS — Z803 Family history of malignant neoplasm of breast: Secondary | ICD-10-CM

## 2018-04-25 DIAGNOSIS — Z79811 Long term (current) use of aromatase inhibitors: Secondary | ICD-10-CM

## 2018-04-25 DIAGNOSIS — I10 Essential (primary) hypertension: Secondary | ICD-10-CM

## 2018-04-25 DIAGNOSIS — C7951 Secondary malignant neoplasm of bone: Secondary | ICD-10-CM | POA: Diagnosis not present

## 2018-04-25 DIAGNOSIS — Z9012 Acquired absence of left breast and nipple: Secondary | ICD-10-CM

## 2018-04-25 DIAGNOSIS — R232 Flushing: Secondary | ICD-10-CM

## 2018-04-25 DIAGNOSIS — E669 Obesity, unspecified: Secondary | ICD-10-CM

## 2018-04-25 DIAGNOSIS — C50919 Malignant neoplasm of unspecified site of unspecified female breast: Secondary | ICD-10-CM

## 2018-04-25 DIAGNOSIS — Z9221 Personal history of antineoplastic chemotherapy: Secondary | ICD-10-CM

## 2018-04-25 DIAGNOSIS — Z794 Long term (current) use of insulin: Secondary | ICD-10-CM

## 2018-04-25 DIAGNOSIS — E785 Hyperlipidemia, unspecified: Secondary | ICD-10-CM

## 2018-04-25 MED ORDER — OXYCODONE-ACETAMINOPHEN 10-325 MG PO TABS: 1.0000 | ORAL_TABLET | Freq: Four times a day (QID) | ORAL | 0 refills | Status: DC | PRN

## 2018-04-25 NOTE — Telephone Encounter (Signed)
Per 7/25 los, referral for MRI, Mammo

## 2018-04-29 ENCOUNTER — Ambulatory Visit: Payer: MEDICARE | Attending: Oncology

## 2018-04-29 DIAGNOSIS — I972 Postmastectomy lymphedema syndrome: Secondary | ICD-10-CM

## 2018-04-29 DIAGNOSIS — C50919 Malignant neoplasm of unspecified site of unspecified female breast: Secondary | ICD-10-CM

## 2018-04-29 NOTE — Therapy (Signed)
[place], [place], [address] Phone: [phone]   Fax:  [phone]  Physical Therapy Treatment  Patient Details  Name: Mia [name] MRN: [id number] Date of Birth: 06-22-1956 Referring Provider: Dr. Gunnar Bulla Magrinat   Encounter Date: 04/29/2018  PT End of Session - 04/29/18 1347    Visit Number  2    Number of Visits  9    Date for PT Re-Evaluation  06/09/18    PT Start Time  1301    PT Stop Time  1346    PT Time Calculation (min)  45 min    Activity Tolerance  Patient tolerated treatment well    Behavior During Therapy  [facility] for tasks assessed/performed       Past Medical History:  Diagnosis Date  . Breast cancer metastasized to bone ([place])   . Hyperlipidemia   . Hypertension   . Lymphedema of right arm   . Neuropathy associated with cancer ([place])   . Obesity (BMI 35.0-39.9 without comorbidity)     Past Surgical History:  Procedure Laterality Date  . CATARACT EXTRACTION Left   . HYSTERECTOMY ABDOMINAL WITH SALPINGO-OOPHORECTOMY    . MODIFIED RADICAL MASTECTOMY Right     There were no vitals filed for this visit.  Subjective Assessment - 04/29/18 1302    Subjective  I've been wearing my compression sleeves most days during the day but not every day. I mostly wear the glove that comes up to my elbow.     Pertinent History  Right breast cancer diagnosed 2000 with mastectomy with 16 lymph nodes removed (4 positive), chemo and radiation. 2008 had metastases in bones and lungs, and has been on treatment since. Just moved down here three weeks ago and is seeing Dr. [name] now. Diabetes, high blood pressure, high cholesterol.    Patient Stated Goals  keep the swelling down, especially in the warm weather    Currently in Pain?  No/denies            LYMPHEDEMA/ONCOLOGY QUESTIONNAIRE - 04/29/18 1303      Right Upper Extremity Lymphedema   15 cm Proximal to Olecranon Process  41.6 cm    10 cm Proximal to Olecranon Process  41.7 cm    Olecranon Process  31.2 cm    15 cm Proximal to Ulnar Styloid Process  32.1 cm    10 cm Proximal to Ulnar Styloid Process  30.9 cm    Just Proximal to Ulnar Styloid Process  22.1 cm    Across Hand at [employer]  19.9 cm    At [place] of 2nd Digit  6.5 cm           Outpatient Re[facility] from [facility]/17/[facility]  Lymphedema Life Impact Scale Total Score  19.12 %           [facility] Adult PT Treatment/Exercise - 04/29/18 0001      Manual Therapy   Manual Therapy  Manual Lymphatic Drainage (MLD)    Manual Lymphatic Drainage (MLD)  In Supine: Short neck, superfical and deep abdominals, Rt inguinal and Lt axillary nodes, Rt axillo-inguinal and anterior inter-axillary anastomosis, then Rt UE working from lateral upper arm to distal hand then retracing all steps reviewing with pt throughout.               PT Short Term Goals - 03/17/18 1723      PT SHORT TERM GOAL #1   Title  Pt. will  be independent in correctly performing self-manual lymph drainage.    Time  4    Period  Weeks    Status  New      PT SHORT TERM GOAL #2   Title  Right arm circumference at 10 cm. proximal to ulnar styloid will stay below 31.3 cm. even as we head into the heat of summer    Time  4    Period  Weeks    Status  New        PT Long Term Goals - 03/17/18 1726      PT LONG TERM GOAL #1   Title  Rt. arm circumference will reduce to 31.1 cm. or less.    Baseline  31.3 cm. at eval compared to 27.6 on the left    Time  8    Period  Weeks    Status  New      PT LONG TERM GOAL #2   Title  Pt. will be able to independently maintain arm by doing self-manual lymph drainage and using compression garments appropriately.            Plan - 04/29/18 1347    Clinical Impression Statement  Temeausred pts circumference of Rt UE and she is mostly maintaining very well since evaluation a few weeks ago. She has been  wearing her compression garments most days and performing self MLD. Reviewed technique with her which she needed to be reminded notto slide and can use full hand, not fingertips. Pt to return in 2 weeks for another measurment and assess how she is doing.    Rehab Potential  Excellent    Clinical Impairments Affecting Rehab Potential  none    PT Frequency  1x / week    PT Duration  4 weeks then decrease frequency to once every 2 weeks, then once a month    PT Treatment/Interventions  ADLs/Self Care Home Management;Patient/family education;DME Instruction;Manual lymph drainage;Manual techniques;Taping    PT Next Visit Plan  Manual lymph drainage for right UE; review self-manual lymph drainage. If she brings them, check the fit of her Elvarex arm garments.    Consulted and Agree with Plan of Care  Patient       Patient will benefit from skilled therapeutic intervention in order to improve the following deficits and impairments:  Increased edema  Visit Diagnosis: Postmastectomy lymphedema  Metastatic breast cancer [facility])     Problem List Patient Active Problem List   Diagnosis Date Noted  . Metastatic breast cancer ([place]) 03/12/2018  . Bone metastases ([place]) 03/12/2018  . Lung metastases ([place]) 03/12/2018  . Osteonecrosis ([place]) 03/12/2018  . Peripheral neuropathy due to chemotherapy ([place]) 03/12/2018  . Diabetes mellitus type 2 in obese ([place]) 03/12/2018  . Hepatic steatosis 03/12/2018  . Aortic atherosclerosis ([place]) 03/12/2018    Otelia Limes, PTA 04/29/2018, 1:50 PM  [place], [place], [address] Phone: [phone]   Fax:  [phone]  Name: Mia [name] MRN: [id number] Date of Birth: [birth year]

## 2018-05-13 ENCOUNTER — Ambulatory Visit: Payer: MEDICARE | Attending: Oncology

## 2018-05-13 DIAGNOSIS — C50919 Malignant neoplasm of unspecified site of unspecified female breast: Secondary | ICD-10-CM

## 2018-05-13 DIAGNOSIS — I972 Postmastectomy lymphedema syndrome: Secondary | ICD-10-CM | POA: Diagnosis not present

## 2018-05-13 NOTE — Therapy (Addendum)
[place], [place], [address] Phone: [phone]   Fax:  [phone]  Physical Therapy Treatment  Patient Details  Name: Mia [name] MRN: [id number] Date of Birth: November 23, 1955 Referring Provider: Dr. Gunnar Bulla Magrinat   Encounter Date: 05/13/2018  PT End of Session - 05/13/18 1349    Visit Number  3    Number of Visits  9    Date for PT Re-Evaluation  06/09/18    PT Start Time  6962    PT Stop Time  9528    PT Time Calculation (min)  44 min    Activity Tolerance  Patient tolerated treatment well    Behavior During Therapy  [facility] for tasks assessed/performed       Past Medical History:  Diagnosis Date  . Breast cancer metastasized to bone ([place])   . Hyperlipidemia   . Hypertension   . Lymphedema of right arm   . Neuropathy associated with cancer ([place])   . Obesity (BMI 35.0-39.9 without comorbidity)     Past Surgical History:  Procedure Laterality Date  . CATARACT EXTRACTION Left   . HYSTERECTOMY ABDOMINAL WITH SALPINGO-OOPHORECTOMY    . MODIFIED RADICAL MASTECTOMY Right     There were no vitals filed for this visit.  Subjective Assessment - 05/13/18 1306    Subjective  I wear my compression garment when home at all times but I don't wear it out of the house. I think my arm is doing pertty good.    Pertinent History  Right breast cancer diagnosed 2000 with mastectomy with 16 lymph nodes removed (4 positive), chemo and radiation. 2008 had metastases in bones and lungs, and has been on treatment since. Just moved down here three weeks ago and is seeing Dr. [name] now. Diabetes, high blood pressure, high cholesterol.    Patient Stated Goals  keep the swelling down, especially in the warm weather    Currently in Pain?  No/denies            LYMPHEDEMA/ONCOLOGY QUESTIONNAIRE - 05/13/18 1307      Right Upper Extremity Lymphedema   15 cm Proximal to Olecranon Process  41.3 cm    10 cm  Proximal to Olecranon Process  41.3 cm    Olecranon Process  30.9 cm    15 cm Proximal to Ulnar Styloid Process  32 cm    10 cm Proximal to Ulnar Styloid Process  30.5 cm    Just Proximal to Ulnar Styloid Process  21.7 cm    Across Hand at [employer]  19.6 cm    At [place] of 2nd Digit  6.2 cm           Outpatient Re[facility] from [facility]/17/[facility]  Lymphedema Life Impact Scale Total Score  19.12 %           [facility] Adult PT Treatment/Exercise - 05/13/18 0001      Manual Therapy   Manual Therapy  Manual Lymphatic Drainage (MLD)    Manual Lymphatic Drainage (MLD)  In Supine: Short neck, superfical and deep abdominals, Rt inguinal and Lt axillary nodes, Rt axillo-inguinal and anterior inter-axillary anastomosis, then Rt UE working from lateral upper arm to distal hand then retracing all steps reviewing with pt throughout.               PT Short Term Goals - 03/17/18 1723      PT SHORT TERM GOAL #1   Title  Pt.  will be independent in correctly performing self-manual lymph drainage.    Time  4    Period  Weeks    Status  New      PT SHORT TERM GOAL #2   Title  Right arm circumference at 10 cm. proximal to ulnar styloid will stay below 31.3 cm. even as we head into the heat of summer    Time  4    Period  Weeks    Status  New        PT Long Term Goals - 05/13/18 1424      PT LONG TERM GOAL #1   Title  Rt. arm circumference 10 cm proximal to the ulanr styloid process will reduce to 31.1 cm. or less.    Baseline  31.3 cm. at eval compared to 27.6 on the left; 30.5 cm - 05/13/18    Time  8    Period  Weeks    Status  Achieved      PT LONG TERM GOAL #2   Title  Pt. will be able to independently maintain arm by doing self-manual lymph drainage and using compression garments appropriately.    Baseline  Pt is doing this well as shown by her reduced circumference measurements-05/13/18    Status  Achieved            Plan -  05/13/18 1349    Clinical Impression Statement  Pt has done very well with this episode of therapy which focused on mostly review and encouraging her to cont to be compliant with her self care of her chronic and progressive condition of lymphedema. She has met her goals and is ready for D/C at this time.     Rehab Potential  Excellent    Clinical Impairments Affecting Rehab Potential  none    PT Frequency  1x / week    PT Duration  4 weeks    PT Treatment/Interventions  ADLs/Self Care Home Management;Patient/family education;DME Instruction;Manual lymph drainage;Manual techniques;Taping    PT Next Visit Plan  D/C this visit.    Consulted and Agree with Plan of Care  Patient       Patient will benefit from skilled therapeutic intervention in order to improve the following deficits and impairments:  Increased edema  Visit Diagnosis: Postmastectomy lymphedema  Metastatic breast cancer [facility])     Problem List Patient Active Problem List   Diagnosis Date Noted  . Metastatic breast cancer ([place]) 03/12/2018  . Bone metastases ([place]) 03/12/2018  . Lung metastases ([place]) 03/12/2018  . Osteonecrosis ([place]) 03/12/2018  . Peripheral neuropathy due to chemotherapy ([place]) 03/12/2018  . Diabetes mellitus type 2 in obese ([place]) 03/12/2018  . Hepatic steatosis 03/12/2018  . Aortic atherosclerosis ([place]) 03/12/2018    Otelia Limes, PTA 05/13/2018, 2:29 PM  [place], [place], [address] Phone: [phone]   Fax:  [phone]  Name: Mia [name] MRN: [id number] Date of Birth: 03/26/1956  PHYSICAL THERAPY DISCHARGE SUMMARY  Visits from Start of Care: 3  Current functional level related to goals / functional outcomes: As above    Remaining deficits: As above    Education / Equipment: As above  Plan: Patient agrees to discharge.  Patient goals were met. Patient is being discharged due to  meeting the stated rehab goals.  ?????    Maudry Diego, PT 05/22/18 10:24 AM

## 2018-05-22 ENCOUNTER — Other Ambulatory Visit: Payer: Self-pay | Admitting: *Deleted

## 2018-05-22 ENCOUNTER — Inpatient Hospital Stay: Payer: MEDICARE | Attending: Oncology

## 2018-05-22 DIAGNOSIS — C7800 Secondary malignant neoplasm of unspecified lung: Secondary | ICD-10-CM

## 2018-05-22 DIAGNOSIS — C50911 Malignant neoplasm of unspecified site of right female breast: Secondary | ICD-10-CM | POA: Diagnosis not present

## 2018-05-22 DIAGNOSIS — M879 Osteonecrosis, unspecified: Secondary | ICD-10-CM

## 2018-05-22 DIAGNOSIS — C50919 Malignant neoplasm of unspecified site of unspecified female breast: Secondary | ICD-10-CM

## 2018-05-22 DIAGNOSIS — C7951 Secondary malignant neoplasm of bone: Secondary | ICD-10-CM

## 2018-05-22 LAB — COMPREHENSIVE METABOLIC PANEL
ALBUMIN: 3.8 g/dL (ref 3.5–5.0)
ALK PHOS: 83 U/L (ref 38–126)
ALT: 16 U/L (ref 0–44)
AST: 16 U/L (ref 15–41)
Anion gap: 9 (ref 5–15)
BUN: 16 mg/dL (ref 8–23)
CO2: 26 mmol/L (ref 22–32)
CREATININE: 0.84 mg/dL (ref 0.44–1.00)
Calcium: 9.8 mg/dL (ref 8.9–10.3)
Chloride: 104 mmol/L (ref 98–111)
GFR calc non Af Amer: >60 mL/min (ref >60)
GLUCOSE: 143 mg/dL — AB (ref 70–99)
Potassium: 4.5 mmol/L (ref 3.5–5.1)
SODIUM: 139 mmol/L (ref 135–145)
Total Bilirubin: 0.3 mg/dL (ref 0.3–1.2)
Total Protein: 7.4 g/dL (ref 6.5–8.1)

## 2018-05-22 LAB — CBC WITH DIFFERENTIAL/PLATELET
BASOS PCT: 0 %
Basophils Absolute: 0 10*3/uL (ref 0.0–0.1)
EOS ABS: 0 10*3/uL (ref 0.0–0.5)
Eosinophils Relative: 1 %
HCT: 35.3 % (ref 34.8–46.6)
Hemoglobin: 11.7 g/dL (ref 11.6–15.9)
Lymphocytes Relative: 19 %
Lymphs Abs: 0.6 10*3/uL — ABNORMAL LOW (ref 0.9–3.3)
MCH: 34.4 pg — ABNORMAL HIGH (ref 25.1–34.0)
MCHC: 33.2 g/dL (ref 31.5–36.0)
MCV: 103.5 fL — ABNORMAL HIGH (ref 79.5–101.0)
MONO ABS: 0.4 10*3/uL (ref 0.1–0.9)
MONOS PCT: 11 %
Neutro Abs: 2.2 10*3/uL (ref 1.5–6.5)
Neutrophils Relative %: 69 %
Platelets: 223 10*3/uL (ref 145–400)
RBC: 3.41 MIL/uL — ABNORMAL LOW (ref 3.70–5.45)
RDW: 16.7 % — AB (ref 11.2–14.5)
WBC: 3.2 10*3/uL — ABNORMAL LOW (ref 3.9–10.3)

## 2018-05-22 MED ORDER — OXYCODONE-ACETAMINOPHEN 10-325 MG PO TABS: 1.0000 | ORAL_TABLET | Freq: Four times a day (QID) | ORAL | 0 refills | Status: DC | PRN

## 2018-05-23 LAB — CANCER ANTIGEN 27.29: CA 27.29: 109.1 U/mL — ABNORMAL HIGH (ref 0.0–38.6)

## 2018-06-03 ENCOUNTER — Encounter: Payer: Self-pay | Admitting: Family Medicine

## 2018-06-03 ENCOUNTER — Ambulatory Visit (INDEPENDENT_AMBULATORY_CARE_PROVIDER_SITE_OTHER): Payer: MEDICARE | Admitting: Family Medicine

## 2018-06-03 VITALS — BP 128/78 | HR 102 | Temp 99.2°F | Ht 63.19 in | Wt 217.0 lb

## 2018-06-03 DIAGNOSIS — I700 Atherosclerosis of aorta: Secondary | ICD-10-CM

## 2018-06-03 DIAGNOSIS — E669 Obesity, unspecified: Secondary | ICD-10-CM | POA: Diagnosis not present

## 2018-06-03 DIAGNOSIS — T451X5A Adverse effect of antineoplastic and immunosuppressive drugs, initial encounter: Secondary | ICD-10-CM | POA: Diagnosis not present

## 2018-06-03 DIAGNOSIS — E1169 Type 2 diabetes mellitus with other specified complication: Secondary | ICD-10-CM | POA: Diagnosis not present

## 2018-06-03 DIAGNOSIS — E785 Hyperlipidemia, unspecified: Secondary | ICD-10-CM | POA: Diagnosis not present

## 2018-06-03 DIAGNOSIS — G620 Drug-induced polyneuropathy: Secondary | ICD-10-CM

## 2018-06-03 DIAGNOSIS — I10 Essential (primary) hypertension: Secondary | ICD-10-CM | POA: Diagnosis not present

## 2018-06-03 DIAGNOSIS — C50919 Malignant neoplasm of unspecified site of unspecified female breast: Secondary | ICD-10-CM | POA: Diagnosis not present

## 2018-06-03 LAB — TSH: TSH: 2.02 u[IU]/mL (ref 0.35–4.50)

## 2018-06-03 LAB — LIPID PANEL
CHOL/HDL RATIO: 3
Cholesterol: 168 mg/dL (ref 0–200)
HDL: 49.7 mg/dL (ref >39.00)
LDL Cholesterol: 85 mg/dL (ref 0–99)
NONHDL: 118.03
Triglycerides: 163 mg/dL — ABNORMAL HIGH (ref 0.0–149.0)
VLDL: 32.6 mg/dL (ref 0.0–40.0)

## 2018-06-03 LAB — HEMOGLOBIN A1C: Hgb A1c MFr Bld: 6.2 % (ref 4.6–6.5)

## 2018-06-03 MED ORDER — PANTOPRAZOLE SODIUM 40 MG PO TBEC: 40.0000 mg | DELAYED_RELEASE_TABLET | Freq: Every day | ORAL | 1 refills | Status: DC

## 2018-06-03 NOTE — Assessment & Plan Note (Addendum)
Has improved with gabapentin 300 mg three times daily. Monitored by her oncologist.  No changes made today. She did express interest in a trial of weaning to 300 mg twice daily.  If she does this, she will let me know.

## 2018-06-03 NOTE — Progress Notes (Signed)
Mia Boys, MD/thx dmf

## 2018-06-03 NOTE — Patient Instructions (Signed)
Great to meet you. I will call you with your lab results from today and you can view them online.   Try using aquafor on your heels at night and then place socks over your feet before bed.  Please make an appointment to see me in 3 months.

## 2018-06-03 NOTE — Progress Notes (Signed)
Subjective:   Patient ID: Mia [name], female    DOB: 08/21/1956, 62 y.o.   MRN: [id number]  Mia [name] is a pleasant 62 y.o. year old female who presents to clinic today with New Patient (Initial Visit) (Patient is here today to establish care.  She brought in some fasting labs from May that were fasting and Immunizations and other records.  She needed to find a PCP and is needing her medications to be monitored for here DM, HTN, HLD.  Dr. Shelba Flake is monitoring her Ocycodone, Gabapentin, Ibrance, and) and addendum (Letrozole. Her Oncologist is the only other doctor she sees who will be ordering her Mammogram for the fall with her PET scan.  She would like for you to look at her feet for the excessive peeling. Tried an antifungal in the past but it did not help.  She uses [employer] which helps somewhat.)  on 06/03/2018  HPI:  Mia [name] is here to establish care. Chart reviewed prior and during visit.  Stage 4 metastatic breast cancer- followed by Dr. [name].  Last saw him on 04/25/18.  OV reviewed along with additional records reviewed today. Originally diagnosed with Stage IIIA (T2N2M0) invasive ductal carcinoma, estrogen receptor positive and HER2 negative right breast cancer in 2000.  Underwent  ight mastectomy, with 4 positive metastatic lymph nodes. She had chemotherapy with taxol and cytoxan and fulvestrant in the past. She had radiation and completed 5 years of tamoxifen.   She was then diagnosed with Stage IV disease 04/18/2005 with metastases to the bone, lung, and additional nodes.  She has been on several different chemotherapeutic regiments since that time.    Most recent CA-13-29 (January 2019) was 58 Her most recent restaging studies with a CT of her abdomen and pelvis were done at [facility] on 12/17/2017 showing: A 6 mm pulmonary nodule in the left upper lobe laterally.MRI of lumbar spine from 03/21/18-  Progression of metastatic disease to the bones at the  L4 and L5 vertebral bodies..  Most recent CA-27-29 (May 22, 2018) was 80.4, it was 72.3 two months prior to that and 58 in 10/2017.  Dr. Magrinot's plan on 04/25/18 was to continue her current treatment, monitoring her [place] closely and to follow up in November with a PET scan just before seeing him.  He is prescribing her Letrozole, Ibrance, gabapentin (for chemotherapy associated neuropathy) and oxycodone. Pain is mainly in her lower back, hips and pelvis and she feels it is relatively controlled at this time.  She is here to establish care for me to manage her other chronic medical conditions, including HTN, DM, and HLD.  DM- currently taking Janumet 50-1000 mg daily along with Actose 15 mg daily. a1c in 01/2018 was 7.1. Does not check FSBS. Denies any episodes of hypoglycemia.  HLD- taking pravachol 40 mg daily with Tricor 45 mg daily.  HTN- has been well controlled with Cozaar 100 mg daily, lopressor 25 mg daily, and Caduet 10-10 mg daily.  Lab Results  Component Value Date   CREATININE 0.84 05/22/2018   Lab Results  Component Value Date   NA 139 05/22/2018   K 4.5 05/22/2018   CL 104 05/22/2018   CO2 26 05/22/2018   Lab Results  Component Value Date   ALT 16 05/22/2018   AST 16 05/22/2018   ALKPHOS 83 05/22/2018   BILITOT 0.3 05/22/2018      Current Outpatient Medications on File Prior to Visit  Medication Sig Dispense Refill  . amlodipine-atorvastatin (CADUET) 10-10 MG  tablet Take 1 tablet by mouth daily.    [place] Kitchen aspirin 81 MG chewable tablet Chew by mouth daily.    . fenofibrate (TRICOR) 145 MG tablet Take 145 mg by mouth daily.    Mar[place] Kitchen gabapentin (NEURONTIN) 300 MG capsule Take 300 mg by mouth 3 (three) times daily.    . Insulin Admin Supplies MISC Inject 24 Units into the skin daily.    Mar[place] Kitchen letrozole (FEMARA) 2.5 MG tablet Take 2.5 mg by mouth daily.    Mar[place] Kitchen losartan (COZAAR) 100 MG tablet Take 100 mg by mouth daily.    . metoprolol tartrate (LOPRESSOR) 25 MG tablet Take 25 mg  by mouth daily.    Mar[place] Kitchen oxyCODONE-acetaminophen (PERCOCET) 10-325 MG tablet Take 1 tablet by mouth every 6 (six) hours as needed for pain. 120 tablet 0  . palbociclib (IBRANCE) 125 MG capsule Take 1 capsule (125 mg total) by mouth daily with breakfast. Take whole with food. Take for 21 days on, 7 days off, repeat every 28 days. 21 capsule 6  . pioglitazone (ACTOS) 15 MG tablet Take 15 mg by mouth daily.    . pravastatin (PRAVACHOL) 40 MG tablet Take 40 mg by mouth daily.    . sitaGLIPtin-metformin (JANUMET) 50-1000 MG tablet Take 1 tablet by mouth daily.     No current facility-administered medications on file prior to visit.     Allergies  Allergen Reactions  . Morphine And Related Anaphylaxis    Past Medical History:  Diagnosis Date  . Breast cancer metastasized to bone ([place])   . Hyperlipidemia   . Hypertension   . Lymphedema of right arm   . Neuropathy associated with cancer ([place])   . Obesity (BMI 35.0-39.9 without comorbidity)     Past Surgical History:  Procedure Laterality Date  . CATARACT EXTRACTION Left   . HYSTERECTOMY ABDOMINAL WITH SALPINGO-OOPHORECTOMY    . MODIFIED RADICAL MASTECTOMY Right     No family history on file.  Social History   Socioeconomic History  . Marital status: Married    Spouse name: Not on file  . Number of children: Not on file  . Years of education: Not on file  . Highest education level: Not on file  Occupational History  . Not on file  Social Needs  . Financial resource strain: Not on file  . Food insecurity:    Worry: Not on file    Inability: Not on file  . Transportation needs:    Medical: Not on file    Non-medical: Not on file  Tobacco Use  . Smoking status: Never Smoker  . Smokeless tobacco: Never Used  Substance and Sexual Activity  . Alcohol use: Not Currently  . Drug use: Never  . Sexual activity: Not Currently  Lifestyle  . Physical activity:    Days per week: Not on file    Minutes per session: Not on file  .  Stress: Not on file  Relationships  . Social connections:    Talks on phone: Not on file    Gets together: Not on file    Attends religious service: Not on file    Active member of club or organization: Not on file    Attends meetings of clubs or organizations: Not on file    Relationship status: Not on file  . Intimate partner violence:    Fear of current or ex partner: Not on file    Emotionally abused: Not on file    Physically abused: Not on file  Forced sexual activity: Not on file  Other Topics Concern  . Not on file  Social History Narrative  . Not on file   The PMH, PSH, Social History, Family History, Medications, and allergies have been reviewed in [facility], and have been updated if relevant.   Review of Systems  Constitutional: Negative.   HENT: Negative.   Eyes: Negative.   Respiratory: Negative.   Cardiovascular: Negative.   Gastrointestinal: Positive for nausea. Negative for constipation, diarrhea, rectal pain and vomiting.  Endocrine: Negative.   Musculoskeletal: Positive for arthralgias and back pain.  Neurological: Positive for numbness. Negative for headaches.  Hematological: Negative.   Psychiatric/Behavioral: Negative.   All other systems reviewed and are negative.      Objective:    BP 128/78 (BP Location: Left Arm, Patient Position: Sitting, Cuff Size: Normal)   Pulse (!) 102   Temp 99.2 F (37.3 C) (Oral)   Ht 5' 3.19" (1.605 m)   Wt 217 lb (98.4 kg)   LMP 10/01/2006 Comment: Total Hysterectomy  SpO2 98%   BMI 38.21 kg/m    Physical Exam  Constitutional: She is oriented to person, place, and time. She appears well-developed and well-nourished.  HENT:  Head: Normocephalic and atraumatic.  alopecia  Eyes: EOM are normal.  Neck: Normal range of motion.  Cardiovascular: Regular rhythm.  Pulmonary/Chest: Effort normal and breath sounds normal.  Abdominal: Soft. Bowel sounds are normal.  Musculoskeletal: Normal range of motion.    Neurological: She is alert and oriented to person, place, and time. No cranial nerve deficit.  Skin: Skin is warm and dry. She is not diaphoretic.  Dry skin on her heels bilaterally  Psychiatric: She has a normal mood and affect. Her behavior is normal. Judgment and thought content normal.  Nursing note and vitals reviewed.         Assessment & Plan:   Diabetes mellitus type 2 in obese ([place]) - Plan: Hemoglobin A1c, TSH  Peripheral neuropathy due to chemotherapy ([place])  Metastatic breast cancer ([facility])  Hypertension, unspecified type  Hyperlipidemia, unspecified hyperlipidemia type - Plan: Lipid panel  Aortic atherosclerosis ([place]) No follow-ups on file.

## 2018-06-03 NOTE — Assessment & Plan Note (Signed)
Due for a1c today. Check lab, no changes made to rxs. On ARB and statin. Pneumococcal vaccination UTD.

## 2018-06-03 NOTE — Assessment & Plan Note (Signed)
On statin and ASA.

## 2018-06-03 NOTE — Progress Notes (Signed)
Kirt Boys, MD/thx dmf

## 2018-06-03 NOTE — Assessment & Plan Note (Signed)
Followed closely by oncologist.  Chart reviewed extensively prior and during OV today. >30 minutes spent in face to face time with patient, >50% spent in counselling or coordination of care

## 2018-06-03 NOTE — Assessment & Plan Note (Signed)
Well controlled on current rxs. No changes made to antihypertensives today. Lab Results  Component Value Date   CREATININE 0.84 05/22/2018

## 2018-06-03 NOTE — Assessment & Plan Note (Signed)
On pravachol 40 mg daily. Due for labs today.

## 2018-06-03 NOTE — Progress Notes (Signed)
notw

## 2018-06-19 ENCOUNTER — Encounter: Payer: Self-pay | Admitting: Adult Health

## 2018-06-19 ENCOUNTER — Inpatient Hospital Stay (HOSPITAL_BASED_OUTPATIENT_CLINIC_OR_DEPARTMENT_OTHER): Payer: MEDICARE | Admitting: Adult Health

## 2018-06-19 ENCOUNTER — Telehealth: Payer: Self-pay | Admitting: Adult Health

## 2018-06-19 ENCOUNTER — Inpatient Hospital Stay: Payer: MEDICARE | Attending: Oncology

## 2018-06-19 VITALS — BP 132/85 | HR 95 | Temp 98.4°F | Resp 18 | Ht 63.19 in | Wt 219.1 lb

## 2018-06-19 DIAGNOSIS — Z90722 Acquired absence of ovaries, bilateral: Secondary | ICD-10-CM | POA: Insufficient documentation

## 2018-06-19 DIAGNOSIS — E669 Obesity, unspecified: Secondary | ICD-10-CM | POA: Insufficient documentation

## 2018-06-19 DIAGNOSIS — Z79899 Other long term (current) drug therapy: Secondary | ICD-10-CM

## 2018-06-19 DIAGNOSIS — Z9221 Personal history of antineoplastic chemotherapy: Secondary | ICD-10-CM | POA: Insufficient documentation

## 2018-06-19 DIAGNOSIS — Z170 Estrogen receptor positive status [ER+]: Secondary | ICD-10-CM | POA: Diagnosis not present

## 2018-06-19 DIAGNOSIS — Z794 Long term (current) use of insulin: Secondary | ICD-10-CM

## 2018-06-19 DIAGNOSIS — G629 Polyneuropathy, unspecified: Secondary | ICD-10-CM | POA: Insufficient documentation

## 2018-06-19 DIAGNOSIS — C778 Secondary and unspecified malignant neoplasm of lymph nodes of multiple regions: Secondary | ICD-10-CM

## 2018-06-19 DIAGNOSIS — Z7982 Long term (current) use of aspirin: Secondary | ICD-10-CM

## 2018-06-19 DIAGNOSIS — I700 Atherosclerosis of aorta: Secondary | ICD-10-CM | POA: Diagnosis not present

## 2018-06-19 DIAGNOSIS — I890 Lymphedema, not elsewhere classified: Secondary | ICD-10-CM | POA: Insufficient documentation

## 2018-06-19 DIAGNOSIS — G893 Neoplasm related pain (acute) (chronic): Secondary | ICD-10-CM | POA: Insufficient documentation

## 2018-06-19 DIAGNOSIS — I10 Essential (primary) hypertension: Secondary | ICD-10-CM | POA: Diagnosis not present

## 2018-06-19 DIAGNOSIS — Z79811 Long term (current) use of aromatase inhibitors: Secondary | ICD-10-CM | POA: Diagnosis not present

## 2018-06-19 DIAGNOSIS — K760 Fatty (change of) liver, not elsewhere classified: Secondary | ICD-10-CM | POA: Diagnosis not present

## 2018-06-19 DIAGNOSIS — Z923 Personal history of irradiation: Secondary | ICD-10-CM | POA: Diagnosis not present

## 2018-06-19 DIAGNOSIS — C7800 Secondary malignant neoplasm of unspecified lung: Secondary | ICD-10-CM

## 2018-06-19 DIAGNOSIS — Z800 Family history of malignant neoplasm of digestive organs: Secondary | ICD-10-CM | POA: Insufficient documentation

## 2018-06-19 DIAGNOSIS — C7951 Secondary malignant neoplasm of bone: Secondary | ICD-10-CM | POA: Diagnosis not present

## 2018-06-19 DIAGNOSIS — R978 Other abnormal tumor markers: Secondary | ICD-10-CM | POA: Diagnosis not present

## 2018-06-19 DIAGNOSIS — M879 Osteonecrosis, unspecified: Secondary | ICD-10-CM

## 2018-06-19 DIAGNOSIS — Z9011 Acquired absence of right breast and nipple: Secondary | ICD-10-CM | POA: Insufficient documentation

## 2018-06-19 DIAGNOSIS — E785 Hyperlipidemia, unspecified: Secondary | ICD-10-CM

## 2018-06-19 DIAGNOSIS — Z90710 Acquired absence of both cervix and uterus: Secondary | ICD-10-CM | POA: Insufficient documentation

## 2018-06-19 DIAGNOSIS — C50919 Malignant neoplasm of unspecified site of unspecified female breast: Secondary | ICD-10-CM

## 2018-06-19 DIAGNOSIS — C50911 Malignant neoplasm of unspecified site of right female breast: Secondary | ICD-10-CM | POA: Diagnosis not present

## 2018-06-19 DIAGNOSIS — Z803 Family history of malignant neoplasm of breast: Secondary | ICD-10-CM | POA: Insufficient documentation

## 2018-06-19 LAB — COMPREHENSIVE METABOLIC PANEL
ALT: 15 U/L (ref 0–44)
ANION GAP: 9 (ref 5–15)
AST: 18 U/L (ref 15–41)
Albumin: 3.7 g/dL (ref 3.5–5.0)
Alkaline Phosphatase: 74 U/L (ref 38–126)
BUN: 19 mg/dL (ref 8–23)
CHLORIDE: 103 mmol/L (ref 98–111)
CO2: 26 mmol/L (ref 22–32)
Calcium: 9.4 mg/dL (ref 8.9–10.3)
Creatinine, Ser: 0.85 mg/dL (ref 0.44–1.00)
Glucose, Bld: 158 mg/dL — ABNORMAL HIGH (ref 70–99)
POTASSIUM: 4.3 mmol/L (ref 3.5–5.1)
Sodium: 138 mmol/L (ref 135–145)
Total Bilirubin: 0.4 mg/dL (ref 0.3–1.2)
Total Protein: 7.3 g/dL (ref 6.5–8.1)

## 2018-06-19 LAB — CBC WITH DIFFERENTIAL/PLATELET
Basophils Absolute: 0 10*3/uL (ref 0.0–0.1)
Basophils Relative: 1 %
EOS ABS: 0 10*3/uL (ref 0.0–0.5)
EOS PCT: 1 %
HCT: 34.5 % — ABNORMAL LOW (ref 34.8–46.6)
Hemoglobin: 11.6 g/dL (ref 11.6–15.9)
LYMPHS ABS: 0.6 10*3/uL — AB (ref 0.9–3.3)
LYMPHS PCT: 21 %
MCH: 35.1 pg — AB (ref 25.1–34.0)
MCHC: 33.7 g/dL (ref 31.5–36.0)
MCV: 104.2 fL — ABNORMAL HIGH (ref 79.5–101.0)
MONO ABS: 0.3 10*3/uL (ref 0.1–0.9)
Monocytes Relative: 11 %
Neutro Abs: 1.8 10*3/uL (ref 1.5–6.5)
Neutrophils Relative %: 66 %
PLATELETS: 198 10*3/uL (ref 145–400)
RBC: 3.31 MIL/uL — ABNORMAL LOW (ref 3.70–5.45)
RDW: 15.5 % — AB (ref 11.2–14.5)
WBC: 2.8 10*3/uL — ABNORMAL LOW (ref 3.9–10.3)

## 2018-06-19 MED ORDER — OXYCODONE-ACETAMINOPHEN 10-325 MG PO TABS: 1.0000 | ORAL_TABLET | Freq: Four times a day (QID) | ORAL | 0 refills | Status: DC | PRN

## 2018-06-19 NOTE — Telephone Encounter (Signed)
Per 9/19 no los

## 2018-06-19 NOTE — Progress Notes (Signed)
[facility]  [phone] [phone]     ID: Mia [name] DOB: 06/27/1956  MR#: [id number]  CSN#:[id number]  Patient Care Team: Aron, Talia M, MD as PCP - General (Family Medicine) Magrinat, Gustav C, MD as Consulting Physician (Oncology) OTHER MD: Margarita Gareis MD, [place]  CHIEF COMPLAINT: Estrogen receptor positive stage IV breast cancer  CURRENT TREATMENT: Letrozole, palbociclib   HISTORY OF CURRENT ILLNESS: We have reviewed Mia medical records from [facility], which is Mia source of Mia information below:  Mia [name] was initially diagnosed with Stage IIIA (T2N2M0) invasive ductal carcinoma, estrogen receptor positive and HER2 negative right breast cancer in 2000. She underwent right mastectomy, with 4 positive metastatic lymph nodes. She had chemotherapy with taxol and cytoxan and fulvestrant in Mia past. She had radiation and completed 5 years of tamoxifen.   She was diagnosed with Stage IV disease 04/18/2005 with metastases to Mia bone, lung, and additional nodes. She was on capecitabine but was discontinued after rising CA 27-29 and scans.   She started anastrozole and everolimus with Xgeva in April [name]. She tolerated this well, but this was discontinued in July [name] due to abnormal blood tests, hyperlipidemia and hyperglycemia.  She began Abraxane 100mg /m2 and Xgeva in August [name] weekly x3 with 1 week off. She tolerated this treatment well. She discontinued Xgeva January 2016 due to osteonecrosis of Mia jaw and mouth issues. She continued abraxane alone until her last dose on 11/18/2015 due to neuropathy and disease progression with restaging studies on 11/23/2015 with a CT chest abdomen and pelvis and one scan showing skull, sternal and femoral lesions.   She was switched to gemcitabine starting 01/20/2016 weekly x3 and 1 week off. This was discontinued on 06/15/2016 due to a port related jugular clot on Mia right  side. She was given Lovenox for 3 months.  She was switched to Letrozole 2.5 mg and palbociclib 125 mg starting 07/13/2016. She tolerated this well. Restaging studies with CT chest abdomen and pelvis and bone scan on 07/01/2017 showed stable/ improving lesions. CA 27-29 was stable.  During Mia last few months of follow up in [place], she completed restaging studies with a CT chest abdomen and pelvis at [facility] on 12/17/2017 showing: A 6 mm pulmonary nodule in Mia left upper lobe laterally. Progression of metastatic disease to Mia bones at Mia L4 and L5 vertebral bodies. Sclerotic metastases at T8, T10, an T11, are similar to previous exams. Sclerotic metastases in Mia sternum stable. Hepatic steatosis. Aortic atherosclerosis.  Most recent CA-27-29 (January 2019) was 58.  Most recent hemoglobin A1c was 7.1 according to Mia patient  Mia patient's subsequent history is as detailed below.  INTERVAL HISTORY: Mia [name] returns today for follow up and treatment of her metastatic estrogen receptor positive breast cancer. She continues on letrozole, with good tolerance.  She does continue to have tolerable hot flashes, worse at night.    She also takes palbociclib, currently at 125 mg daily 21 days on and 7 days off, with good tolerance. She starts day 1 on 04/26/2018. She does feel fatigued from time to time.  She has not noted as much nausea recently.     REVIEW OF SYSTEMS: Mia [name] is doing moderately well today.  She does take Percocet for cancer related pain.  She is needing a refill. Mia pain she notes Mia pain is in her lower back.  She takes Percocet every 5 hours.  She takes 3-4 per day.  This   pain regimen is working well for her.  She denies any constipation.  She denies excessive sleepiness or lethargy.  This regimen is helping her remain functional with her cancer that is in her bones.    Mia [name]'s past three tumor markers have been slowly climbing from 72.3 to 109.1 4 weeks ago.  Todays is  pending.   Otherwise Mia [name] is doing well today.  She denies any unusual headaches, or visual changes.  She is without any chest pain, shortness of breath, palpitations, or vomiting.  She denies any bowel/bladder changes.  A detailed ROS was otherwise non contributory today.   PAST MEDICAL HISTORY: Past Medical History:  Diagnosis Date  . Breast cancer metastasized to bone (HCC)   . Hyperlipidemia   . Hypertension   . Lymphedema of right arm   . Neuropathy associated with cancer (HCC)   . Obesity (BMI 35.0-39.9 without comorbidity)      PAST SURGICAL HISTORY: Past Surgical History:  Procedure Laterality Date  . CATARACT EXTRACTION Left   . HYSTERECTOMY ABDOMINAL WITH SALPINGO-OOPHORECTOMY    . MODIFIED RADICAL MASTECTOMY Right       FAMILY HISTORY No family history on file.  Mia [name],Mia [name]. --Mia patient's father died at age 76 due to a PE, and he also had a history of colon cancer diagnosed at age 61. Mia patient's mother died at age 84 due to diabetes and survived a cerebral aneurysm.Mia patient's mother also had a history of breast cancer diagnosed at age 49.  Mia patient has no brothers and 1 sister. Mia patient's sister was diagnosed with non invasive breast cancer at age 58. There was a paternal grandmother diagnosed with breast cancer at age 78. Mia patient denies a family history of ovarian cancer.    GYNECOLOGIC HISTORY:  No LMP recorded. Menarche: 62 years old Age at first live birth: 62 years old She is GX P2. She is status post hysterectomy with bilateral salpingo- oophorectomy in 2009 at about age 51.  She never used HRT.    SOCIAL HISTORY:  Mia [name] due to her breast cancer. 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 Her husband, Gordon is in Mia process of retiring. He is a sub-station electrician for a power company. Mia patient's daughter, Megan  Bean, lives in [place] and works as a school psychologist. Mia patient's second daughter, Rebecca Gross is a stay at home mom and is a special needs teacher, who will soon move to [place] Mia patient plans on attending [employer].      ADVANCED DIRECTIVES:    HEALTH MAINTENANCE: Social History   Tobacco Use  . Smoking status: Never Smoker  . Smokeless tobacco: Never Used  Substance Use Topics  . Alcohol use: Not Currently  . Drug use: Never     Colonoscopy: 2017   PAP:  Bone density: never  Mammogram: 2017   Allergies  Allergen Reactions  . Morphine And Related Anaphylaxis    Current Outpatient Medications  Medication Sig Dispense Refill  . amlodipine-atorvastatin (CADUET) 10-10 MG tablet Take 1 tablet by mouth daily.    . aspirin 81 MG chewable tablet Chew by mouth daily.    . fenofibrate (TRICOR) 145 MG tablet Take 145 mg by mouth daily.    . gabapentin (NEURONTIN) 300 MG capsule Take 300 mg by mouth 2 (two) times daily. Pt is only taking 2 capsules a day    .   Insulin Admin Supplies MISC Inject 24 Units into Mia skin daily.    . letrozole (FEMARA) 2.5 MG tablet Take 2.5 mg by mouth daily.    . loratadine (CLARITIN) 10 MG tablet Take 10 mg by mouth daily.    . losartan (COZAAR) 100 MG tablet Take 100 mg by mouth daily.    . metoprolol tartrate (LOPRESSOR) 25 MG tablet Take 25 mg by mouth daily.    . oxyCODONE-acetaminophen (PERCOCET) 10-325 MG tablet Take 1 tablet by mouth every 6 (six) hours as needed for pain. 120 tablet 0  . palbociclib (IBRANCE) 125 MG capsule Take 1 capsule (125 mg total) by mouth daily with breakfast. Take whole with food. Take for 21 days on, 7 days off, repeat every 28 days. 21 capsule 6  . pantoprazole (PROTONIX) 40 MG tablet Take 1 tablet (40 mg total) by mouth daily. 90 tablet 1  . pioglitazone (ACTOS) 15 MG tablet Take 15 mg by mouth daily.    . pravastatin (PRAVACHOL) 40 MG tablet Take 40 mg by mouth daily.    .  sitaGLIPtin-metformin (JANUMET) 50-1000 MG tablet Take 1 tablet by mouth daily.     No current facility-administered medications for this visit.     OBJECTIVE:  Vitals:   06/19/18 1128  BP: 132/85  Pulse: 95  Resp: 18  Temp: 98.4 F (36.9 C)  SpO2: 97%     Body mass index is 38.58 kg/m.   Wt Readings from Last 3 Encounters:  06/19/18 219 lb 1.6 oz (99.4 kg)  06/03/18 217 lb (98.4 kg)  04/25/18 218 lb 8 oz (99.1 kg)      ECOG FS:1 - Symptomatic but completely ambulatory GENERAL: Patient is a well appearing female in no acute distress HEENT:  Sclerae anicteric.  Oropharynx clear and moist. No ulcerations or evidence of oropharyngeal candidiasis. Neck is supple.  NODES:  No cervical, supraclavicular, or axillary lymphadenopathy palpated.  BREAST EXAM:  Right breast s/p mastectomy without nodules, masses or sign of local recurrence, left breast without nodules, masses, skin or nipple changes LUNGS:  Clear to auscultation bilaterally, decreased air movement in bases bilaterally.  No wheezes or rhonchi. HEART:  Regular rate and rhythm. No murmur appreciated. ABDOMEN:  Soft, nontender.  Positive, normoactive bowel sounds. No organomegaly palpated. MSK:  No focal spinal tenderness to palpation. Full range of motion bilaterally in Mia upper extremities. EXTREMITIES:  No peripheral edema.   SKIN:  Clear with no obvious rashes or skin changes. No nail dyscrasia. NEURO:  Nonfocal. Well oriented.  Appropriate affect.    LAB RESULTS:  CMP     Component Value Date/Time   NA 139 05/22/2018 1119   NA 140 01/31/2018   K 4.5 05/22/2018 1119   CL 104 05/22/2018 1119   CO2 26 05/22/2018 1119   GLUCOSE 143 (H) 05/22/2018 1119   BUN 16 05/22/2018 1119   BUN 22 (A) 01/31/2018   CREATININE 0.84 05/22/2018 1119   CALCIUM 9.8 05/22/2018 1119   PROT 7.4 05/22/2018 1119   ALBUMIN 3.8 05/22/2018 1119   AST 16 05/22/2018 1119   ALT 16 05/22/2018 1119   ALKPHOS 83 05/22/2018 1119   BILITOT  0.3 05/22/2018 1119   GFRNONAA >60 05/22/2018 1119   GFRAA >60 05/22/2018 1119    No results found for: TOTALPROTELP, ALBUMINELP, A1GS, A2GS, BETS, BETA2SER, GAMS, MSPIKE, SPEI  No results found for: KPAFRELGTCHN, LAMBDASER, KAPLAMBRATIO  Lab Results  Component Value Date   WBC 2.8 (L) 06/19/2018   NEUTROABS   1.8 06/19/2018   HGB 11.6 06/19/2018   HCT 34.5 (L) 06/19/2018   MCV 104.2 (H) 06/19/2018   PLT 198 06/19/2018    _0 @  No results found for: LABCA2  No components found for: [id number]  No results for input(s): INR in Mia last 168 hours.  No results found for: LABCA2  No results found for: MBW466  No results found for: ZLD357  No results found for: SVX793  Lab Results  Component Value Date   CA2729 109.1 (H) 05/22/2018    No components found for: HGQUANT  No results found for: CEA1 / No results found for: CEA1   No results found for: AFPTUMOR  No results found for: [place]  No results found for: PSA1  Appointment on 06/19/2018  Component Date Value Ref Range Status  . WBC 06/19/2018 2.8* 3.9 - 10.3 K/uL Final  . RBC 06/19/2018 3.31* 3.70 - 5.45 MIL/uL Final  . Hemoglobin 06/19/2018 11.6  11.6 - 15.9 g/dL Final  . HCT 06/19/2018 34.5* 34.8 - 46.6 % Final  . MCV 06/19/2018 104.2* 79.5 - 101.0 fL Final  . MCH 06/19/2018 35.1* 25.1 - 34.0 pg Final  . MCHC 06/19/2018 33.7  31.5 - 36.0 g/dL Final  . RDW 06/19/2018 15.5* 11.2 - 14.5 % Final  . Platelets 06/19/2018 198  145 - 400 K/uL Final  . Neutrophils Relative % 06/19/2018 66  % Final  . Neutro Abs 06/19/2018 1.8  1.5 - 6.5 K/uL Final  . Lymphocytes Relative 06/19/2018 21  % Final  . Lymphs Abs 06/19/2018 0.6* 0.9 - 3.3 K/uL Final  . Monocytes Relative 06/19/2018 11  % Final  . Monocytes Absolute 06/19/2018 0.3  0.1 - 0.9 K/uL Final  . Eosinophils Relative 06/19/2018 1  % Final  . Eosinophils Absolute 06/19/2018 0.0  0.0 - 0.5 K/uL Final  . Basophils Relative 06/19/2018 1  % Final  .  Basophils Absolute 06/19/2018 0.0  0.0 - 0.1 K/uL Final   Performed at [facility], [place] [address], [place], [place] [address]    (this displays Mia last labs from Mia last 3 days)  No results found for: TOTALPROTELP, ALBUMINELP, A1GS, A2GS, BETS, BETA2SER, GAMS, MSPIKE, SPEI (this displays SPEP labs)  No results found for: KPAFRELGTCHN, LAMBDASER, KAPLAMBRATIO (kappa/lambda light chains)  No results found for: HGBA, HGBA2QUANT, HGBFQUANT, HGBSQUAN (Hemoglobinopathy evaluation)   No results found for: LDH  No results found for: IRON, TIBC, IRONPCTSAT (Iron and TIBC)  No results found for: FERRITIN  Urinalysis No results found for: COLORURINE, APPEARANCEUR, LABSPEC, PHURINE, GLUCOSEU, HGBUR, BILIRUBINUR, KETONESUR, PROTEINUR, UROBILINOGEN, NITRITE, LEUKOCYTESUR   STUDIES: Since her last visit, she completed a spinal MRI on 03/21/2018 showing: MRI cervicothoracic spine: Nonenhancing T1 and T8 metastasis.  No pathologic fracture. MRI lumbar spine: Multiple enhancing and nonenhancing metastasis. No pathologic fracture. No canal stenosis.  Mild LEFT L4-5 neural foraminal narrowing  ELIGIBLE FOR [name] RESEARCH PROTOCOL: *no  ASSESSMENT: 62 y.o. [place], [place] woman with stage IV breast cancer, as follows:  (1) status post right mastectomy in 2000, for a pT2 pN2, stage IIIA invasive ductal carcinoma, estrogen receptor positive, progesterone receptor not tested, HER-2 negative (0) by immunohistochemistry  (a) adjuvant chemotherapy with doxorubicin and cyclophosphamide in dose dense fashion x4 followed by paclitaxel in dose dense fashion x4  (b) adjuvant radiation: 30 doses  (c) antiestrogens: Tamoxifen for 5 years, completed 2005  (2) METASTASTIC DISEASE: 2008, involving bones, lungs, and lymph nodes  (a) CA 27-29 informative  (b)  CT of Mia chest abdomen and pelvis in [place] finds stable sclerotic metastases (T8, T10, T11, sternum, L4, L5;  0.6 cm left upper lobe lung nodule stable  (3)  prior anti-estrogen treatments:  (a) fulvestrant--progression  (b) exemestane/everolimus--hyperlipidemia, hyperglycemia  (4) prior chemotherapy treatments:  (a) capecitabine: progression  (b) Abraxane, August [name] through 11/18/2015: good response but stopped due to neuropathy  (c) gemcitabine 01/20/2016--with multiple interruptions secondary to infections  (5) radiation therapy:  (a) T spine an Right femur, completed 01/10/2016  (6) letrozole/ palbociclib started 07/13/2016  (7) bone treatment:  (a) denosumab/Xgeva--discontinued January 2016 due to osteonecrosis of Mia jaw  (8) cancer associated pain:  (a) currently on oxycodone/APAP 10/325 QID with no recent change in dose  PLAN:  Ryeleigh is doing well today.  She continues on Letrozole and Palbociclib with good tolerance.  Her labs are stable and I reviewed these with her in detail.  I did note that she has rising tumor markers over Mia past few months.  We will f/u on her levels tomorrow, and then determine whether or not to proceed with scans earlier than Dr. Magrinat's original plan of November.    I did refill her pain medication today.  I reviewed PMP aware and this was reviewed with Dr. Gudena.    Sonam will return in 4 weeks for labs only and in 8 weeks for labs, f/u.  She knows to call for any other issues that may develop before Mia next visit.  A total of (30) minutes of face-to-face time was spent with this patient with greater than 50% of that time in counseling and care-coordination.   Lindsey Causey, NP  06/19/18 11:48 AM Medical Oncology and Hematology [facility] [address] [place], [place] [address] Tel. [phone]    Fax. [phone]     

## 2018-06-20 LAB — CANCER ANTIGEN 27.29: CAN 27.29: 87.6 U/mL — AB (ref 0.0–38.6)

## 2018-07-09 ENCOUNTER — Other Ambulatory Visit: Payer: Self-pay

## 2018-07-09 ENCOUNTER — Encounter: Payer: Self-pay | Admitting: Family Medicine

## 2018-07-09 MED ORDER — PANTOPRAZOLE SODIUM 40 MG PO TBEC: 40.0000 mg | DELAYED_RELEASE_TABLET | Freq: Every day | ORAL | 1 refills | Status: DC

## 2018-07-17 ENCOUNTER — Inpatient Hospital Stay: Payer: MEDICARE | Attending: Oncology

## 2018-07-17 ENCOUNTER — Other Ambulatory Visit: Payer: Self-pay | Admitting: Adult Health

## 2018-07-17 DIAGNOSIS — C50911 Malignant neoplasm of unspecified site of right female breast: Secondary | ICD-10-CM | POA: Diagnosis not present

## 2018-07-17 DIAGNOSIS — C7800 Secondary malignant neoplasm of unspecified lung: Secondary | ICD-10-CM

## 2018-07-17 DIAGNOSIS — C50919 Malignant neoplasm of unspecified site of unspecified female breast: Secondary | ICD-10-CM

## 2018-07-17 DIAGNOSIS — M879 Osteonecrosis, unspecified: Secondary | ICD-10-CM

## 2018-07-17 DIAGNOSIS — C7951 Secondary malignant neoplasm of bone: Secondary | ICD-10-CM

## 2018-07-17 LAB — COMPREHENSIVE METABOLIC PANEL
ALBUMIN: 3.7 g/dL (ref 3.5–5.0)
ALT: 16 U/L (ref 0–44)
ANION GAP: 13 (ref 5–15)
AST: 16 U/L (ref 15–41)
Alkaline Phosphatase: 84 U/L (ref 38–126)
BUN: 14 mg/dL (ref 8–23)
CO2: 25 mmol/L (ref 22–32)
Calcium: 9.9 mg/dL (ref 8.9–10.3)
Chloride: 103 mmol/L (ref 98–111)
Creatinine, Ser: 0.86 mg/dL (ref 0.44–1.00)
GFR calc Af Amer: >60 mL/min (ref >60)
GFR calc non Af Amer: >60 mL/min (ref >60)
GLUCOSE: 140 mg/dL — AB (ref 70–99)
POTASSIUM: 4.6 mmol/L (ref 3.5–5.1)
SODIUM: 141 mmol/L (ref 135–145)
Total Bilirubin: 0.4 mg/dL (ref 0.3–1.2)
Total Protein: 7.5 g/dL (ref 6.5–8.1)

## 2018-07-17 LAB — CBC WITH DIFFERENTIAL/PLATELET
ABS IMMATURE GRANULOCYTES: 0.01 10*3/uL (ref 0.00–0.07)
BASOS ABS: 0.1 10*3/uL (ref 0.0–0.1)
Basophils Relative: 3 %
Eosinophils Absolute: 0 10*3/uL (ref 0.0–0.5)
Eosinophils Relative: 1 %
HCT: 35.6 % — ABNORMAL LOW (ref 36.0–46.0)
Hemoglobin: 11.8 g/dL — ABNORMAL LOW (ref 12.0–15.0)
IMMATURE GRANULOCYTES: 0 %
LYMPHS ABS: 0.7 10*3/uL (ref 0.7–4.0)
Lymphocytes Relative: 22 %
MCH: 34.9 pg — AB (ref 26.0–34.0)
MCHC: 33.1 g/dL (ref 30.0–36.0)
MCV: 105.3 fL — AB (ref 80.0–100.0)
MONOS PCT: 13 %
Monocytes Absolute: 0.4 10*3/uL (ref 0.1–1.0)
NEUTROS ABS: 1.9 10*3/uL (ref 1.7–7.7)
NEUTROS PCT: 61 %
NRBC: 0 % (ref 0.0–0.2)
PLATELETS: 206 10*3/uL (ref 150–400)
RBC: 3.38 MIL/uL — ABNORMAL LOW (ref 3.87–5.11)
RDW: 14.1 % (ref 11.5–15.5)
WBC: 3.1 10*3/uL — ABNORMAL LOW (ref 4.0–10.5)

## 2018-07-17 MED ORDER — OXYCODONE-ACETAMINOPHEN 10-325 MG PO TABS: 1.0000 | ORAL_TABLET | Freq: Four times a day (QID) | ORAL | 0 refills | Status: DC | PRN

## 2018-07-17 NOTE — Progress Notes (Signed)
Reviewed PMP aware.  Patient getting Percocet refilled.  Pain is controlled with this regimen.    Wilber Bihari, NP

## 2018-07-18 LAB — CANCER ANTIGEN 27.29: CA 27.29: 96.2 U/mL — ABNORMAL HIGH (ref 0.0–38.6)

## 2018-08-11 ENCOUNTER — Ambulatory Visit (HOSPITAL_COMMUNITY): Admission: RE | Admit: 2018-08-11 | Payer: BLUE CROSS/BLUE SHIELD | Source: Ambulatory Visit

## 2018-08-14 ENCOUNTER — Encounter: Payer: Self-pay | Admitting: Adult Health

## 2018-08-14 ENCOUNTER — Ambulatory Visit (HOSPITAL_COMMUNITY)
Admission: RE | Admit: 2018-08-14 | Discharge: 2018-08-14 | Disposition: A | Discharge status: Home / Self Care | Payer: MEDICARE | Source: Ambulatory Visit | Attending: Adult Health | Admitting: Adult Health

## 2018-08-14 ENCOUNTER — Inpatient Hospital Stay: Payer: MEDICARE | Attending: Oncology

## 2018-08-14 ENCOUNTER — Inpatient Hospital Stay (HOSPITAL_BASED_OUTPATIENT_CLINIC_OR_DEPARTMENT_OTHER): Payer: MEDICARE | Admitting: Adult Health

## 2018-08-14 ENCOUNTER — Telehealth: Payer: Self-pay | Admitting: Adult Health

## 2018-08-14 VITALS — BP 133/83 | HR 90 | Temp 97.8°F | Resp 18 | Ht 63.19 in | Wt 225.4 lb

## 2018-08-14 DIAGNOSIS — Z90710 Acquired absence of both cervix and uterus: Secondary | ICD-10-CM

## 2018-08-14 DIAGNOSIS — Z7982 Long term (current) use of aspirin: Secondary | ICD-10-CM | POA: Diagnosis not present

## 2018-08-14 DIAGNOSIS — Z79811 Long term (current) use of aromatase inhibitors: Secondary | ICD-10-CM | POA: Diagnosis not present

## 2018-08-14 DIAGNOSIS — C7800 Secondary malignant neoplasm of unspecified lung: Secondary | ICD-10-CM | POA: Insufficient documentation

## 2018-08-14 DIAGNOSIS — C7951 Secondary malignant neoplasm of bone: Secondary | ICD-10-CM | POA: Insufficient documentation

## 2018-08-14 DIAGNOSIS — I10 Essential (primary) hypertension: Secondary | ICD-10-CM

## 2018-08-14 DIAGNOSIS — C50919 Malignant neoplasm of unspecified site of unspecified female breast: Secondary | ICD-10-CM

## 2018-08-14 DIAGNOSIS — Z9221 Personal history of antineoplastic chemotherapy: Secondary | ICD-10-CM | POA: Insufficient documentation

## 2018-08-14 DIAGNOSIS — Z923 Personal history of irradiation: Secondary | ICD-10-CM | POA: Insufficient documentation

## 2018-08-14 DIAGNOSIS — C50911 Malignant neoplasm of unspecified site of right female breast: Secondary | ICD-10-CM | POA: Diagnosis not present

## 2018-08-14 DIAGNOSIS — M25551 Pain in right hip: Secondary | ICD-10-CM

## 2018-08-14 DIAGNOSIS — G893 Neoplasm related pain (acute) (chronic): Secondary | ICD-10-CM | POA: Diagnosis not present

## 2018-08-14 DIAGNOSIS — Z170 Estrogen receptor positive status [ER+]: Secondary | ICD-10-CM | POA: Insufficient documentation

## 2018-08-14 DIAGNOSIS — Z79899 Other long term (current) drug therapy: Secondary | ICD-10-CM

## 2018-08-14 DIAGNOSIS — M879 Osteonecrosis, unspecified: Secondary | ICD-10-CM

## 2018-08-14 LAB — COMPREHENSIVE METABOLIC PANEL
ALBUMIN: 3.6 g/dL (ref 3.5–5.0)
ALK PHOS: 72 U/L (ref 38–126)
ALT: 14 U/L (ref 0–44)
AST: 17 U/L (ref 15–41)
Anion gap: 9 (ref 5–15)
BUN: 12 mg/dL (ref 8–23)
CO2: 24 mmol/L (ref 22–32)
CREATININE: 0.83 mg/dL (ref 0.44–1.00)
Calcium: 9.3 mg/dL (ref 8.9–10.3)
Chloride: 105 mmol/L (ref 98–111)
GFR calc Af Amer: >60 mL/min (ref >60)
GLUCOSE: 95 mg/dL (ref 70–99)
Potassium: 4.3 mmol/L (ref 3.5–5.1)
Sodium: 138 mmol/L (ref 135–145)
Total Bilirubin: 0.4 mg/dL (ref 0.3–1.2)
Total Protein: 7.2 g/dL (ref 6.5–8.1)

## 2018-08-14 LAB — CBC WITH DIFFERENTIAL/PLATELET
Abs Immature Granulocytes: 0.02 10*3/uL (ref 0.00–0.07)
Basophils Absolute: 0.1 10*3/uL (ref 0.0–0.1)
Basophils Relative: 2 %
EOS PCT: 1 %
Eosinophils Absolute: 0 10*3/uL (ref 0.0–0.5)
HEMATOCRIT: 34.1 % — AB (ref 36.0–46.0)
Hemoglobin: 11.2 g/dL — ABNORMAL LOW (ref 12.0–15.0)
Immature Granulocytes: 1 %
LYMPHS ABS: 0.6 10*3/uL — AB (ref 0.7–4.0)
LYMPHS PCT: 20 %
MCH: 34.6 pg — AB (ref 26.0–34.0)
MCHC: 32.8 g/dL (ref 30.0–36.0)
MCV: 105.2 fL — AB (ref 80.0–100.0)
MONO ABS: 0.4 10*3/uL (ref 0.1–1.0)
MONOS PCT: 14 %
Neutro Abs: 1.8 10*3/uL (ref 1.7–7.7)
Neutrophils Relative %: 62 %
Platelets: 201 10*3/uL (ref 150–400)
RBC: 3.24 MIL/uL — AB (ref 3.87–5.11)
RDW: 14.2 % (ref 11.5–15.5)
WBC: 2.9 10*3/uL — AB (ref 4.0–10.5)
nRBC: 0 % (ref 0.0–0.2)

## 2018-08-14 MED ORDER — OXYCODONE-ACETAMINOPHEN 10-325 MG PO TABS: 1.0000 | ORAL_TABLET | Freq: Four times a day (QID) | ORAL | 0 refills | Status: DC | PRN

## 2018-08-14 NOTE — Telephone Encounter (Signed)
Printed calendar and avs. °

## 2018-08-14 NOTE — Progress Notes (Signed)
[place]  [phone] [phone]     ID: Mia [name] DOB: December 27, 1955  MR#: [id number]  MPN#:[id number]  Patient Care Team: Mia Passy, MD as PCP - General (Family Medicine) [name], Mia Dad, MD as Consulting Physician (Oncology) OTHER MD: Mia Austria MD, Mia Sabal PA  CHIEF COMPLAINT: Estrogen receptor positive stage IV breast cancer  CURRENT TREATMENT: Letrozole, palbociclib   HISTORY OF CURRENT ILLNESS: We have reviewed the medical records from [place], which is the source of the information below:  Mia [name] was initially diagnosed with Stage IIIA (T2N2M0) invasive ductal carcinoma, estrogen receptor positive and HER2 negative right breast cancer in 2000. She underwent right mastectomy, with 4 positive metastatic lymph nodes. She had chemotherapy with taxol and cytoxan and fulvestrant in the past. She had radiation and completed 5 years of tamoxifen.   She was diagnosed with Stage IV disease 04/18/2005 with metastases to the bone, lung, and additional nodes. She was on capecitabine but was discontinued after rising CA 27-29 and scans.   She started anastrozole and everolimus with Delton See in April 2014. She tolerated this well, but this was discontinued in July 2014 due to abnormal blood tests, hyperlipidemia and hyperglycemia.  She began Abraxane 153m /m2 and Xgeva in August 2014 weekly x3 with 1 week off. She tolerated this treatment well. She discontinued Xgeva January 2016 due to osteonecrosis of the jaw and mouth issues. She continued abraxane alone until her last dose on 11/18/2015 due to neuropathy and disease progression with restaging studies on 11/23/2015 with a CT chest abdomen and pelvis and one scan showing skull, sternal and femoral lesions.   She was switched to gemcitabine starting 01/20/2016 weekly x3 and 1 week off. This was discontinued on 06/15/2016 due to a port related jugular clot on the right  side. She was given Lovenox for 3 months.  She was switched to Letrozole 2.5 mg and palbociclib 125 mg starting 07/13/2016. She tolerated this well. Restaging studies with CT chest abdomen and pelvis and bone scan on 07/01/2017 showed stable/ improving lesions. CA 27-29 was stable.  During the last few months of follow up in [place] she completed restaging [facility]: A 6 mm pulmonary nodule in the left upper lobe laterally. Progression of metastatic disease to the bones at the L4 and L5 vertebral bodies. Sclerotic metastases at T8, T10, an T11, are similar to previous exams. Sclerotic metastases in the sternum stable. Hepatic steatosis. Aortic atherosclerosis.  Most recent CCA-59-29(January 2019) was 58.  Most recent hemoglobin A1c was 7.1 according to the patient  The patient's subsequent history is as detailed below.  INTERVAL HISTORY: Mia [name] today for follow up and treatment of her metastatic estrogen receptor positive breast cancer. She continues on letrozole, with good tolerance.  She was noting hot flashes, worse at night, however recently these have improved.    She also takes palbociclib, currently at 125 mg daily 21 days on and 7 days off, with good tolerance. She starts day 1 on this Saturday, 08/16/18.     REVIEW OF SYSTEMS: Mia [name] doing moderately well today.  She notes last month she had a week of terrible pain in her right hip, radiating into her groin.  She says the hip was clicking.  She doesn't note any further discomfort.  She is unsure if something was temporarily messed up or inflamed.  She says it has improved since then,  and now all she notes is a click from time to time.  She did not seek evaluation at that time because she was having difficulty with mobility.  Mia [name] continues to have chronic low back pain that has been present since her metastatic breast cancer diagnosis.  She notes that  this pain is unchanged.  She takes percocet for her pain about 3-4 times per day and this helps her to be more functional.  It does not make her overly tired or groggy.  She will be due for refill on 09/05/18 and is requesting I write one for her.  Mia [name] denies unusual headaches, or vision changes.  She is without chest pain, palpitations, cough, shortness of breath.  She isn't having any bowel/bladder issues, nausea, vomiting, appetite change, or weight loss.  Otherwise, a detailed ROS was conducted and was non contributory.    PAST MEDICAL HISTORY: Past Medical History:  Diagnosis Date  . Breast cancer metastasized to bone ([place])   . Hyperlipidemia   . Hypertension   . Lymphedema of right arm   . Neuropathy associated with cancer ([place])   . Obesity (BMI 35.0-39.9 without comorbidity)      PAST SURGICAL HISTORY: Past Surgical History:  Procedure Laterality Date  . CATARACT EXTRACTION Left   . HYSTERECTOMY ABDOMINAL WITH SALPINGO-OOPHORECTOMY    . MODIFIED RADICAL MASTECTOMY Right       FAMILY HISTORY No family history on file.  The patient reports she had negative genetic testing at [facility] 2014,report not available. --The patient's father died at age 57 due to a PE, and he also had a history of colon cancer diagnosed at age 59. The patient's mother died at age 73 due to diabetes and survived a cerebral aneurysm.The patient's mother also had a history of breast cancer diagnosed at age 51.  The patient has no brothers and 1 sister. The patient's sister was diagnosed with non invasive breast cancer at age 106. There was a paternal grandmother diagnosed with breast cancer at age 16. The patient denies a family history of ovarian cancer.    GYNECOLOGIC HISTORY:  No LMP recorded. Menarche: 62 years old Age at first live birth: 62 years old She is [place] P2. She is status post hysterectomy with bilateral salpingo- oophorectomy in 2009 at about age 22.  She never used HRT.     SOCIAL HISTORY:  Mia [name] is disabled due to her breast cancer. She used to be Surveyor, quantity for a cardiology office. Her husband, Mia [name] is in the process of retiring. He is a Electrical engineer for a power company. The patient's daughter, [name], lives in [place] and works as a Teaching laboratory technician. The patient's second daughter, [name] is a stay at home mom and is a special needs teacher, who will soon move to [facility] The patient plans on attending [employer].      ADVANCED DIRECTIVES:    HEALTH MAINTENANCE: Social History   Tobacco Use  . Smoking status: Never Smoker  . Smokeless tobacco: Never Used  Substance Use Topics  . Alcohol use: Not Currently  . Drug use: Never     Colonoscopy: 2017   PAP:  Bone density: never  Mammogram: 2017   Allergies  Allergen Reactions  . Morphine And Related Anaphylaxis    Current Outpatient Medications  Medication Sig Dispense Refill  . amlodipine-atorvastatin (CADUET) 10-10 MG tablet Take 1 tablet by mouth daily.    Marland Kitchen aspirin 81 MG chewable tablet Chew  by mouth daily.    . fenofibrate (TRICOR) 145 MG tablet Take 145 mg by mouth daily.    Marland Kitchen gabapentin (NEURONTIN) 300 MG capsule Take 300 mg by mouth 2 (two) times daily. Pt is only taking 2 capsules a day    . Insulin Admin Supplies MISC Inject 24 Units into the skin daily.    Marland Kitchen letrozole (FEMARA) 2.5 MG tablet Take 2.5 mg by mouth daily.    Marland Kitchen loratadine (CLARITIN) 10 MG tablet Take 10 mg by mouth daily.    Marland Kitchen losartan (COZAAR) 100 MG tablet Take 100 mg by mouth daily.    . metoprolol tartrate (LOPRESSOR) 25 MG tablet Take 25 mg by mouth daily.    Marland Kitchen oxyCODONE-acetaminophen (PERCOCET) 10-325 MG tablet Take 1 tablet by mouth every 6 (six) hours as needed for pain. 120 tablet 0  . palbociclib (IBRANCE) 125 MG capsule Take 1 capsule (125 mg total) by mouth daily with breakfast. Take whole with food. Take for 21 days on, 7 days off, repeat every  28 days. 21 capsule 6  . pantoprazole (PROTONIX) 40 MG tablet Take 1 tablet (40 mg total) by mouth daily. 90 tablet 1  . pioglitazone (ACTOS) 15 MG tablet Take 15 mg by mouth daily.    . pravastatin (PRAVACHOL) 40 MG tablet Take 40 mg by mouth daily.    . sitaGLIPtin-metformin (JANUMET) 50-1000 MG tablet Take 1 tablet by mouth daily.     No current facility-administered medications for this visit.     OBJECTIVE:  Vitals:   08/14/18 1121  BP: 133/83  Pulse: 90  Resp: 18  Temp: 97.8 F (36.6 C)  SpO2: 98%     Body mass index is 39.69 kg/m.   Wt Readings from Last 3 Encounters:  08/14/18 225 lb 6.4 oz (102.2 kg)  06/19/18 219 lb 1.6 oz (99.4 kg)  06/03/18 217 lb (98.4 kg)      ECOG FS:1 - Symptomatic but completely ambulatory GENERAL: Patient is a well appearing female in no acute distress HEENT:  Sclerae anicteric.  Oropharynx clear and moist. No ulcerations or evidence of oropharyngeal candidiasis. Neck is supple.  NODES:  No cervical, supraclavicular, or axillary lymphadenopathy palpated.  BREAST EXAM:  Right breast s/p mastectomy without nodules, masses or sign of local recurrence, left breast without nodules, masses, skin or nipple changes LUNGS:  Clear to auscultation bilaterally, decreased air movement in bases bilaterally.  No wheezes or rhonchi. HEART:  Regular rate and rhythm. No murmur appreciated. ABDOMEN:  Soft, nontender.  Positive, normoactive bowel sounds. No organomegaly palpated. MSK:  No focal spinal tenderness to palpation. Full range of motion bilaterally in the upper extremities. EXTREMITIES:  No peripheral edema.   SKIN:  Clear with no obvious rashes or skin changes. No nail dyscrasia. NEURO:  Nonfocal. Well oriented.  Appropriate affect.    LAB RESULTS:  CMP     Component Value Date/Time   NA 141 07/17/2018 1102   NA 140 01/31/2018   K 4.6 07/17/2018 1102   CL 103 07/17/2018 1102   CO2 25 07/17/2018 1102   GLUCOSE 140 (H) 07/17/2018 1102   BUN  14 07/17/2018 1102   BUN 22 (A) 01/31/2018   CREATININE 0.86 07/17/2018 1102   CALCIUM 9.9 07/17/2018 1102   PROT 7.5 07/17/2018 1102   ALBUMIN 3.7 07/17/2018 1102   AST 16 07/17/2018 1102   ALT 16 07/17/2018 1102   ALKPHOS 84 07/17/2018 1102   BILITOT 0.4 07/17/2018 1102   GFRNONAA >60  07/17/2018 1102   GFRAA >60 07/17/2018 1102    No results found for: TOTALPROTELP, ALBUMINELP, A1GS, A2GS, BETS, BETA2SER, GAMS, MSPIKE, SPEI  No results found for: KPAFRELGTCHN, LAMBDASER, KAPLAMBRATIO  Lab Results  Component Value Date   WBC 2.9 (L) 08/14/2018   NEUTROABS 1.8 08/14/2018   HGB 11.2 (L) 08/14/2018   HCT 34.1 (L) 08/14/2018   MCV 105.2 (H) 08/14/2018   PLT 201 08/14/2018    _0 @  No results found for: LABCA2  No components found for: [id number]  No results for input(s): INR in the last 168 hours.  No results found for: LABCA2  No results found for: CAN199  No results found for: ZJI967  No results found for: ELF810  Lab Results  Component Value Date   CA2729 96.2 (H) 07/17/2018    No components found for: HGQUANT  No results found for: CEA1 / No results found for: CEA1   No results found for: AFPTUMOR  No results found for: [place]  No results found for: PSA1  Appointment on 08/14/2018  Component Date Value Ref Range Status  . WBC 08/14/2018 2.9* 4.0 - 10.5 K/uL Final  . RBC 08/14/2018 3.24* 3.87 - 5.11 MIL/uL Final  . Hemoglobin 08/14/2018 11.2* 12.0 - 15.0 g/dL Final  . HCT 08/14/2018 34.1* 36.0 - 46.0 % Final  . MCV 08/14/2018 105.2* 80.0 - 100.0 fL Final  . MCH 08/14/2018 34.6* 26.0 - 34.0 pg Final  . MCHC 08/14/2018 32.8  30.0 - 36.0 g/dL Final  . RDW 08/14/2018 14.2  11.5 - 15.5 % Final  . Platelets 08/14/2018 201  150 - 400 K/uL Final  . nRBC 08/14/2018 0.0  0.0 - 0.2 % Final  . Neutrophils Relative % 08/14/2018 62  % Final  . Neutro Abs 08/14/2018 1.8  1.7 - 7.7 K/uL Final  . Lymphocytes Relative 08/14/2018 20  % Final  .  Lymphs Abs 08/14/2018 0.6* 0.7 - 4.0 K/uL Final  . Monocytes Relative 08/14/2018 14  % Final  . Monocytes Absolute 08/14/2018 0.4  0.1 - 1.0 K/uL Final  . Eosinophils Relative 08/14/2018 1  % Final  . Eosinophils Absolute 08/14/2018 0.0  0.0 - 0.5 K/uL Final  . Basophils Relative 08/14/2018 2  % Final  . Basophils Absolute 08/14/2018 0.1  0.0 - 0.1 K/uL Final  . Immature Granulocytes 08/14/2018 1  % Final  . Abs Immature Granulocytes 08/14/2018 0.02  0.00 - 0.07 K/uL Final   Performed at [facility], [place] [address], [place], [place] [address]    (this displays the last labs from the last 3 days)  No results found for: TOTALPROTELP, ALBUMINELP, A1GS, A2GS, BETS, BETA2SER, GAMS, MSPIKE, SPEI (this displays SPEP labs)  No results found for: KPAFRELGTCHN, LAMBDASER, KAPLAMBRATIO (kappa/lambda light chains)  No results found for: HGBA, HGBA2QUANT, HGBFQUANT, HGBSQUAN (Hemoglobinopathy evaluation)   No results found for: LDH  No results found for: IRON, TIBC, IRONPCTSAT (Iron and TIBC)  No results found for: FERRITIN  Urinalysis No results found for: COLORURINE, APPEARANCEUR, LABSPEC, PHURINE, GLUCOSEU, HGBUR, BILIRUBINUR, KETONESUR, PROTEINUR, UROBILINOGEN, NITRITE, LEUKOCYTESUR   STUDIES: Since her last visit, she completed a spinal MRI on 03/21/2018 showing: MRI cervicothoracic spine: Nonenhancing T1 and T8 metastasis.  No pathologic fracture. MRI lumbar spine: Multiple enhancing and nonenhancing metastasis. No pathologic fracture. No canal stenosis.  Mild LEFT L4-5 neural foraminal narrowing  ELIGIBLE FOR AVAILABLE RESEARCH PROTOCOL: *no  ASSESSMENT: 62 y.o. [place], [place] woman with stage IV breast cancer, as follows:  (  1) status post right mastectomy in 2000, for a pT2 pN2, stage IIIA invasive ductal carcinoma, estrogen receptor positive, progesterone receptor not tested, HER-2 negative (0) by immunohistochemistry  (a) adjuvant chemotherapy with  doxorubicin and cyclophosphamide in dose dense fashion x4 followed by paclitaxel in dose dense fashion x4  (b) adjuvant radiation: 30 doses  (c) antiestrogens: Tamoxifen for 5 years, completed 2005  (2) METASTASTIC DISEASE: 2008, involving bones, lungs, and lymph nodes  (a) CA 27-29 informative  (b) CT of the chest abdomen and pelvis in [facility] finds stable sclerotic metastases (T8, T10, T11, sternum, L4, L5; 0.6 cm left upper lobe lung nodule stable  (3)  prior anti-estrogen treatments:  (a) fulvestrant--progression  (b) exemestane/everolimus--hyperlipidemia, hyperglycemia  (4) prior chemotherapy treatments:  (a) capecitabine: progression  (b) Abraxane, August 2014 through 11/18/2015: good response but stopped due to neuropathy  (c) gemcitabine 01/20/2016--with multiple interruptions secondary to infections  (5) radiation therapy:  (a) T spine and Right femur, completed 01/10/2016  (6) letrozole/ palbociclib started 07/13/2016  (7) bone treatment:  (a) denosumab/Xgeva--discontinued January 2016 due to osteonecrosis of the jaw  (8) cancer associated pain:  (a) currently on oxycodone/APAP 10/325 QID with no recent change in dose  PLAN:  Cathalina is doing well today.  Her labs are stable and she can restart the Palbociclib on Saturday.  She continues to tolerate this well.  She will continue on Letrozole and I am happy that her hot flashes are much less frequent!  We reviewed that her tumor markers have continued to fluctuate, which really isn't helpful at this point.   She will undergo MRI spine next week which was delayed due to MRI having to postpone her due to several STAT MRIs that needed to be done first.  We will call her with those results.    The next issue is that of her chronic pain related to her metastatic cancer.  She will continue her current pain regimen with Percocet.  I reviewed this with Dr. [name], and no red flags were found on PMP aware or with  patient.  She was given #120 to start on 09/05/2018.  Josanna will also undergo plain films of the hip today for evaluation of the etiology of her recent pain/clicking.    Imagene will return in 4 weeks for labs and f/u with Dr. [name].  She knows to call for any other issues that may develop before the next visit.  A total of (30) minutes of face-to-face time was spent with this patient with greater than 50% of that time in counseling and care-coordination.   Wilber Bihari, NP  08/14/18 11:24 AM Medical Oncology and Hematology [facility] [address] [place], [place] [address] Tel. [phone]    Fax. [phone]

## 2018-08-15 ENCOUNTER — Other Ambulatory Visit: Payer: Self-pay | Admitting: Adult Health

## 2018-08-15 DIAGNOSIS — C7951 Secondary malignant neoplasm of bone: Secondary | ICD-10-CM

## 2018-08-15 DIAGNOSIS — C7800 Secondary malignant neoplasm of unspecified lung: Secondary | ICD-10-CM

## 2018-08-15 DIAGNOSIS — C50919 Malignant neoplasm of unspecified site of unspecified female breast: Secondary | ICD-10-CM

## 2018-08-15 LAB — CANCER ANTIGEN 27.29: CA 27.29: 90.6 U/mL — ABNORMAL HIGH (ref 0.0–38.6)

## 2018-08-15 NOTE — Progress Notes (Signed)
Did plain films of hip on patient yesterday that showed some cancer in the iliac crest. Reviewed our scans on file and noted that patient has only had MRI of spine.  Patient has undergone bone scans and CT chest/abdomen/pelvis in past, in [place].  I reviewed with patient and her last scans of these was in 11/2017.  I reviewed her records and only found scans from 07/2017.  Patient has copies of her March scans and will bring them by next week.  I will get them scanned in the imaging section.  After review with Dr.Magrinat, we will get CT c/a/p and bone scan to get a baseline in our system, to evaluate her metastatic breast cancer, since it has been greater than 6 months since these tests have been done.  Patient verbalizes understanding.  Wilber Bihari, NP

## 2018-08-18 ENCOUNTER — Ambulatory Visit (HOSPITAL_COMMUNITY)
Admission: RE | Admit: 2018-08-18 | Discharge: 2018-08-18 | Disposition: A | Discharge status: Home / Self Care | Payer: MEDICARE | Source: Ambulatory Visit | Attending: Oncology | Admitting: Oncology

## 2018-08-18 DIAGNOSIS — M47812 Spondylosis without myelopathy or radiculopathy, cervical region: Secondary | ICD-10-CM | POA: Diagnosis not present

## 2018-08-18 DIAGNOSIS — C7800 Secondary malignant neoplasm of unspecified lung: Secondary | ICD-10-CM | POA: Diagnosis not present

## 2018-08-18 DIAGNOSIS — C50919 Malignant neoplasm of unspecified site of unspecified female breast: Secondary | ICD-10-CM | POA: Diagnosis not present

## 2018-08-18 DIAGNOSIS — M48061 Spinal stenosis, lumbar region without neurogenic claudication: Secondary | ICD-10-CM | POA: Insufficient documentation

## 2018-08-18 DIAGNOSIS — C7951 Secondary malignant neoplasm of bone: Secondary | ICD-10-CM | POA: Diagnosis not present

## 2018-08-18 MED ORDER — GADOBUTROL 1 MMOL/ML IV SOLN
10.0000 mL | Freq: Once | INTRAVENOUS | Status: AC | PRN
  Administered 2018-08-18: 10 mL via INTRAVENOUS

## 2018-08-21 ENCOUNTER — Other Ambulatory Visit: Payer: Self-pay | Admitting: Oncology

## 2018-09-02 ENCOUNTER — Encounter: Payer: Self-pay | Admitting: Family Medicine

## 2018-09-02 ENCOUNTER — Ambulatory Visit (INDEPENDENT_AMBULATORY_CARE_PROVIDER_SITE_OTHER): Payer: MEDICARE | Admitting: Family Medicine

## 2018-09-02 VITALS — BP 132/76 | HR 78 | Temp 98.7°F | Ht 63.19 in | Wt 225.0 lb

## 2018-09-02 DIAGNOSIS — C7951 Secondary malignant neoplasm of bone: Secondary | ICD-10-CM

## 2018-09-02 DIAGNOSIS — E669 Obesity, unspecified: Secondary | ICD-10-CM | POA: Diagnosis not present

## 2018-09-02 DIAGNOSIS — I10 Essential (primary) hypertension: Secondary | ICD-10-CM

## 2018-09-02 DIAGNOSIS — Z23 Encounter for immunization: Secondary | ICD-10-CM

## 2018-09-02 DIAGNOSIS — E785 Hyperlipidemia, unspecified: Secondary | ICD-10-CM

## 2018-09-02 DIAGNOSIS — E1169 Type 2 diabetes mellitus with other specified complication: Secondary | ICD-10-CM

## 2018-09-02 LAB — POCT GLYCOSYLATED HEMOGLOBIN (HGB A1C): HEMOGLOBIN A1C: 5.3 % (ref 4.0–5.6)

## 2018-09-02 MED ORDER — METOPROLOL TARTRATE 25 MG PO TABS: 25.0000 mg | ORAL_TABLET | Freq: Every day | ORAL | 3 refills | Status: DC

## 2018-09-02 MED ORDER — PIOGLITAZONE HCL 15 MG PO TABS: 15.0000 mg | ORAL_TABLET | Freq: Every day | ORAL | 3 refills | Status: DC

## 2018-09-02 NOTE — Assessment & Plan Note (Signed)
a1c has improved from 3 months ago.  Denies any symptoms of hypoglycemia. Continue current rxs.  Follow up in 3- 6 months, sooner if she has any concerns. The patient indicates understanding of these issues and agrees with the plan.

## 2018-09-02 NOTE — Patient Instructions (Signed)
Happy Holidays. It was great to see you.  Please come see me in 3- 6 months.

## 2018-09-02 NOTE — Progress Notes (Signed)
Subjective:   Patient ID: Mia [name], female    DOB: [birth year], 62 y.o.   MRN: [id number]  Mia [name] is a pleasant 62 y.o. year old female who presents to clinic today with Follow-up (Doing well-she does not check sugars daily. She is scheduled for imaging to evaluate areas of hot spots in her lumbar.)  on 09/02/2018  HPI:  I last saw patient when she established care with me on 06/03/18.  Note reviewed.  Metastatic breast CA- followed by Dr. [name].  She is scheduled for bone scan this week to evaluate areas in her lumbar spine seen on MRI from 08/18/18 and CT scans.  She is having more pain with movement in her lower back.  IMPRESSION: 1. Interval enlargement of L3-L5 vertebral metastases. 2. New pathologic L4 superior endplate fracture with 41% height loss, mild retropulsion, and mild spinal stenosis. 3. Unchanged size of thoracic spine metastases without significant enhancement. 4. No epidural tumor identified.   DM-   currently taking Janumet 50-1000 mg daily along with Actose 15 mg daily.  Does not check FSBS. Denies any episodes of hypoglycemia. Lab Results  Component Value Date   HGBA1C 5.3 09/02/2018   HLD- On Pravachol 40 mg daily. Lab Results  Component Value Date   CHOL 168 06/03/2018   HDL 49.70 06/03/2018   LDLCALC 85 06/03/2018   TRIG 163.0 (H) 06/03/2018   CHOLHDL 3 06/03/2018   Lab Results  Component Value Date   ALT 14 08/14/2018   AST 17 08/14/2018   ALKPHOS 72 08/14/2018   BILITOT 0.4 08/14/2018       Current Outpatient Medications on File Prior to Visit  Medication Sig Dispense Refill  . amlodipine-atorvastatin (CADUET) 10-10 MG tablet Take 1 tablet by mouth daily.    Marland Kitchen aspirin 81 MG chewable tablet Chew by mouth daily.    . fenofibrate (TRICOR) 145 MG tablet Take 145 mg by mouth daily.    Marland Kitchen gabapentin (NEURONTIN) 300 MG capsule Take 300 mg by mouth 2 (two) times daily. Pt is only taking 2 capsules a day    . Insulin  Admin Supplies MISC Inject 24 Units into the skin daily.    Marland Kitchen letrozole (FEMARA) 2.5 MG tablet Take 2.5 mg by mouth daily.    Marland Kitchen loratadine (CLARITIN) 10 MG tablet Take 10 mg by mouth daily.    Marland Kitchen losartan (COZAAR) 100 MG tablet Take 100 mg by mouth daily.    Derrill Memo ON 09/05/2018] oxyCODONE-acetaminophen (PERCOCET) 10-325 MG tablet Take 1 tablet by mouth every 6 (six) hours as needed for pain. 120 tablet 0  . palbociclib (IBRANCE) 125 MG capsule Take 1 capsule (125 mg total) by mouth daily with breakfast. Take whole with food. Take for 21 days on, 7 days off, repeat every 28 days. 21 capsule 6  . pantoprazole (PROTONIX) 40 MG tablet Take 1 tablet (40 mg total) by mouth daily. 90 tablet 1  . pravastatin (PRAVACHOL) 40 MG tablet Take 40 mg by mouth daily.    . sitaGLIPtin-metformin (JANUMET) 50-1000 MG tablet Take 1 tablet by mouth daily.     No current facility-administered medications on file prior to visit.     Allergies  Allergen Reactions  . Morphine And Related Anaphylaxis    Past Medical History:  Diagnosis Date  . Breast cancer metastasized to bone ([place])   . Hyperlipidemia   . Hypertension   . Lymphedema of right arm   . Neuropathy associated with cancer ([place])   .  Obesity (BMI 35.0-39.9 without comorbidity)     Past Surgical History:  Procedure Laterality Date  . CATARACT EXTRACTION Left   . HYSTERECTOMY ABDOMINAL WITH SALPINGO-OOPHORECTOMY    . MODIFIED RADICAL MASTECTOMY Right     No family history on file.  Social History   Socioeconomic History  . Marital status: Married    Spouse name: Not on file  . Number of children: Not on file  . Years of education: Not on file  . Highest education level: Not on file  Occupational History  . Not on file  Social Needs  . Financial resource strain: Not on file  . Food insecurity:    Worry: Not on file    Inability: Not on file  . Transportation needs:    Medical: Not on file    Non-medical: Not on file  Tobacco Use    . Smoking status: Never Smoker  . Smokeless tobacco: Never Used  Substance and Sexual Activity  . Alcohol use: Not Currently  . Drug use: Never  . Sexual activity: Not Currently  Lifestyle  . Physical activity:    Days per week: Not on file    Minutes per session: Not on file  . Stress: Not on file  Relationships  . Social connections:    Talks on phone: Not on file    Gets together: Not on file    Attends religious service: Not on file    Active member of club or organization: Not on file    Attends meetings of clubs or organizations: Not on file    Relationship status: Not on file  . Intimate partner violence:    Fear of current or ex partner: Not on file    Emotionally abused: Not on file    Physically abused: Not on file    Forced sexual activity: Not on file  Other Topics Concern  . Not on file  Social History Narrative  . Not on file   The PMH, PSH, Social History, Family History, Medications, and allergies have been reviewed in [facility], and have been updated if relevant.  Review of Systems  Constitutional: Negative.   Respiratory: Negative.   Cardiovascular: Negative.   Gastrointestinal: Negative.   Musculoskeletal: Positive for back pain.  Skin: Negative.   Neurological: Negative.   Hematological: Negative.   Psychiatric/Behavioral: Negative.   All other systems reviewed and are negative.      Objective:    BP 132/76   Pulse 78   Temp 98.7 F (37.1 C) (Oral)   Ht 5' 3.19" (1.605 m)   Wt 225 lb (102.1 kg)   SpO2 98%   BMI 39.62 kg/m   Wt Readings from Last 3 Encounters:  09/02/18 225 lb (102.1 kg)  08/14/18 225 lb 6.4 oz (102.2 kg)  06/19/18 219 lb 1.6 oz (99.4 kg)     Physical Exam  Constitutional: She is oriented to person, place, and time. She appears well-developed and well-nourished.  Eyes: EOM are normal.  Neck: Normal range of motion.  Cardiovascular: Normal rate.  Pulmonary/Chest: Effort normal and breath sounds normal.  Abdominal:  Soft.  Musculoskeletal: Normal range of motion. She exhibits no edema.  Neurological: She is alert and oriented to person, place, and time. No cranial nerve deficit.  Skin: Skin is warm and dry. She is not diaphoretic.  Psychiatric: She has a normal mood and affect. Her behavior is normal. Judgment and thought content normal.  Nursing note and vitals reviewed.  Assessment & Plan:   Diabetes mellitus type 2 in obese ([place]) - Plan: POCT HgB A1C  Hyperlipidemia, unspecified hyperlipidemia type  Hypertension, unspecified type  Need for influenza vaccination - Plan: Flu Vaccine QUAD 6+ mos PF IM (Fluarix Quad PF) No follow-ups on file.

## 2018-09-02 NOTE — Assessment & Plan Note (Signed)
Well controlled with current rxs. No changes made.

## 2018-09-02 NOTE — Assessment & Plan Note (Signed)
She is having some worsening pain and new fractures/enlarging lesions on lumbar spine unfortunately.  She does not feel she needs to adjust medications and her spirits are good going into the bone scan and CT she has scheduled for this week. I will follow along and offered my continued support.

## 2018-09-02 NOTE — Assessment & Plan Note (Signed)
Well controlled.  No changes made. 

## 2018-09-03 ENCOUNTER — Ambulatory Visit (HOSPITAL_COMMUNITY): Payer: BLUE CROSS/BLUE SHIELD

## 2018-09-05 ENCOUNTER — Ambulatory Visit (HOSPITAL_COMMUNITY)
Admission: RE | Admit: 2018-09-05 | Discharge: 2018-09-05 | Disposition: A | Discharge status: Home / Self Care | Payer: MEDICARE | Source: Ambulatory Visit | Attending: Adult Health | Admitting: Adult Health

## 2018-09-05 ENCOUNTER — Encounter (HOSPITAL_COMMUNITY)
Admission: RE | Admit: 2018-09-05 | Discharge: 2018-09-05 | Disposition: A | Discharge status: Home / Self Care | Payer: MEDICARE | Source: Ambulatory Visit | Attending: Adult Health | Admitting: Adult Health

## 2018-09-05 DIAGNOSIS — C7800 Secondary malignant neoplasm of unspecified lung: Secondary | ICD-10-CM | POA: Diagnosis not present

## 2018-09-05 DIAGNOSIS — C7951 Secondary malignant neoplasm of bone: Secondary | ICD-10-CM | POA: Diagnosis not present

## 2018-09-05 DIAGNOSIS — C50919 Malignant neoplasm of unspecified site of unspecified female breast: Secondary | ICD-10-CM | POA: Diagnosis not present

## 2018-09-05 MED ORDER — SODIUM CHLORIDE (PF) 0.9 % IJ SOLN
INTRAMUSCULAR | Status: AC
  Filled 2018-09-05: qty 50

## 2018-09-05 MED ORDER — IOHEXOL 300 MG/ML  SOLN
30.0000 mL | Freq: Once | INTRAMUSCULAR | Status: AC | PRN
  Administered 2018-09-05: 30 mL via ORAL

## 2018-09-05 MED ORDER — TECHNETIUM TC 99M MEDRONATE IV KIT
22.0000 | PACK | Freq: Once | INTRAVENOUS | Status: AC | PRN
  Administered 2018-09-05: 22 via INTRAVENOUS

## 2018-09-05 MED ORDER — IOHEXOL 300 MG/ML  SOLN
100.0000 mL | Freq: Once | INTRAMUSCULAR | Status: AC | PRN
  Administered 2018-09-05: 100 mL via INTRAVENOUS

## 2018-09-08 ENCOUNTER — Other Ambulatory Visit: Payer: Self-pay | Admitting: Oncology

## 2018-09-08 NOTE — Progress Notes (Signed)
I called Mia [name] and let her know the preliminary results of her studies which show no definitive disease outside the bones

## 2018-09-10 NOTE — Progress Notes (Signed)
[place]  [phone]) [phone] [phone]) [phone]     ID: Mia [name] DOB: 11/16/1955  MR#: [id number]  ONG#:[id number]  Patient Care Team: Lucille Passy, MD as PCP - General (Family Medicine) Deontra Pereyra, Virgie Dad, MD as Consulting Physician (Oncology) [name], [name] PA  CHIEF COMPLAINT: Estrogen receptor positive stage IV breast cancer  CURRENT TREATMENT: Letrozole, palbociclib   HISTORY OF CURRENT ILLNESS: From the original intake note:  We have reviewed the medical records from [place], which is the source of the information below:  Mia [name] was initially diagnosed with Stage IIIA (T2N2M0) invasive ductal carcinoma, estrogen receptor positive and HER2 negative right breast cancer in 2000. She underwent right mastectomy, with 4 positive metastatic lymph nodes. She had chemotherapy with taxol and cytoxan and fulvestrant in the past. She had radiation and completed 5 years of tamoxifen.   She was diagnosed with Stage IV disease 04/18/2005 with metastases to the bone, lung, and additional nodes. She was on capecitabine but was discontinued after rising CA 27-29 and scans.   She started anastrozole and everolimus with Delton See in April 2014. She tolerated this well, but this was discontinued in July 2014 due to abnormal blood tests, hyperlipidemia and hyperglycemia.  She began Abraxane 15m /m2 and Xgeva in August 2014 weekly x3 with 1 week off. She tolerated this treatment well. She discontinued Xgeva January 2016 due to osteonecrosis of the jaw and mouth issues. She continued abraxane alone until her last dose on 11/18/2015 due to neuropathy and disease progression with restaging studies on 11/23/2015 with a CT chest abdomen and pelvis and one scan showing skull, sternal and femoral lesions.   She was switched to gemcitabine starting 01/20/2016 weekly x3 and 1 week off. This was discontinued on 06/15/2016 due to a port  related jugular clot on the right side. She was given Lovenox for 3 months.  She was switched to Letrozole 2.5 mg and palbociclib 125 mg starting 07/13/2016. She tolerated this well. Restaging studies with CT chest abdomen and pelvis and bone scan on 07/01/2017 showed stable/ improving lesions. CA 27-29 was stable.  During the last few months of follow up [place] she completed restaging studies [facility] showing: A 6 mm pulmonary nodule in the left upper lobe laterally. Progression of metastatic disease to the bones at the L4 and L5 vertebral bodies. Sclerotic metastases at T8, T10, an T11, are similar to previous exams. Sclerotic metastases in the sternum stable. Hepatic steatosis. Aortic atherosclerosis.  Most recent CCA-55-29(January 2019) was 58.  Most recent hemoglobin A1c was 7.1 according to the patient  The patient's subsequent history is as detailed below.  INTERVAL HISTORY: KAlexzandriareturns today for follow-up of her metastatic estrogen receptor positive breast cancer.   The patient continues on palbociclib at 125 mg daily, 3 weeks on and one-week off, which she is tolerating well.  She has very minimal nausea, no unusual fatigue.  The patient also continues on letrozole, which she is tolerating well.  She has rare hot flashes.  Her tumor marker is stable.  Results for RBAMBI, FEHNEL(MRN [id number] as of 09/11/2061 11:24  Ref. Range 03/12/2018 15:44 04/22/2018 11:53 05/22/2018 11:19 06/19/2018 11:11 07/17/2018 11:02 08/14/2018 10:52  CA 27.29 Latest Ref Range: 0.0 - 38.6 U/mL 72.3 (H) 80.4 (H) 109.1 (H) 87.6 (H) 96.2 (H) 90.6 (H)    Since her last visit here, she underwent a total  spine MRI with and without contrast on 08/18/2018, showing interval enlargement of L3-L5 vertebral metastases. There is a new pathologic L4 superior endplate fracture with 37% height loss, mild retropulsion, and mild spinal stenosis. There is an  unchanged size of thoracic spine metastases without significant enhancement. There are no epidural tumors identified.  She also underwent a chest, abdomen, and pelvis CT on 09/05/2018 showing a right mastectomy, with widespread osseous metastasis. There is an isolated prominent but not pathologically enlarged left subpectoral lymph node, indeterminate. Recommend attention on follow-up. The mixed attenuation nodule medial to the ascending colon is favored to represent a benign calcified lymph node or possibly contrast within a diverticulum. Isolated omental/peritoneal metastasis felt unlikely but cannot be excluded. There are nonspecific left-sided pulmonary nodules. There is age advanced coronary artery atherosclerosis. Recommend assessment of coronary risk factors and consideration of medical therapy. There is Aortic Atherosclerosis (ICD10-I70.0). There is left hemidiaphragm elevation with overlying volume loss and trace pleural fluid/thickening. There is mild hepatic steatosis.  Finally, she also underwent a whole body bone scan on 09/05/2018 showing widespread bony metastatic disease as described. There is adequate uptake of the radiopharmaceutical by the skeleton. There is adequate soft tissue clearance and renal activity. Within the calvarium there is a focus of increased uptake posteriorly to the left in the posterior parietal bone. Mildly increased uptake in the anterior temporal region on the right is suspected. There's increased uptake within the body of the sternum as well as in the mid and lower thoracic spine and lower lumbar spine. There is increased uptake within the anterior aspect of the left second rib and anterior aspect of the right seventh rib. There is focally increased uptake in the Peri glenoid region of the right scapula there is markedly increased uptake within the lateral aspect of the right iliac bone extending into the ischium and in the left inferior ischium, periacetabular region,  and inferior pubic ramus. Minimally increased uptake about the right knee and both ankles is likely degenerative.   REVIEW OF SYSTEMS: Shirlyn is doing well overall. She was experiencing pretty severe hip pain that spread to her groin which prompted the CT. The pain resolved after about a week. She hurts the most in her lower back when she tries to stand up. She does some physical activity, usually walking around the block at her house. For Thanksgiving, she drove up to see her daughter at [facility]. The patient denies unusual headaches, visual changes, nausea, vomiting, or dizziness. There has been no unusual cough, phlegm production, or pleurisy. This been no change in bowel or bladder habits. The patient denies unexplained fatigue or unexplained weight loss, bleeding, rash, or fever. A detailed review of systems was otherwise noncontributory.    PAST MEDICAL HISTORY: Past Medical History:  Diagnosis Date  . Breast cancer metastasized to bone ([place])   . Hyperlipidemia   . Hypertension   . Lymphedema of right arm   . Neuropathy associated with cancer ([place])   . Obesity (BMI 35.0-39.9 without comorbidity)      PAST SURGICAL HISTORY: Past Surgical History:  Procedure Laterality Date  . CATARACT EXTRACTION Left   . HYSTERECTOMY ABDOMINAL WITH SALPINGO-OOPHORECTOMY    . MODIFIED RADICAL MASTECTOMY Right       FAMILY HISTORY No family history on file.  The patient reports she had negative genetic testing at [facility] 2014,report not available. --The patient's father died at age 65 due to a PE, and he also had a history of colon  cancer diagnosed at age 58. The patient's mother died at age 33 due to diabetes and survived a cerebral aneurysm.The patient's mother also had a history of breast cancer diagnosed at age 93.  The patient has no brothers and 1 sister. The patient's sister was diagnosed with non invasive breast cancer at age 66. There was a paternal grandmother diagnosed  with breast cancer at age 73. The patient denies a family history of ovarian cancer.    GYNECOLOGIC HISTORY:  Patient's last menstrual period was 10/01/2006. Menarche: 62 years old Age at first live birth: 62 years old She is [place] P2.  She is status post hysterectomy with bilateral salpingo- oophorectomy in 2009 at about age 42.  She never used HRT.    SOCIAL HISTORY:  Felicie is disabled due to her breast cancer. She used to be Surveyor, quantity for a cardiology office. Her husband, [name] is in the process of retiring. He is a Electrical engineer for a power company. The patient's daughter, [name], lives in [place] and works as a Teaching laboratory technician. The patient's second daughter, [name] is a stay at home mom and is a special needs teacher, who will soon move to [facility] [place]). The patient plans on attending [employer].      ADVANCED DIRECTIVES:    HEALTH MAINTENANCE: Social History   Tobacco Use  . Smoking status: Never Smoker  . Smokeless tobacco: Never Used  Substance Use Topics  . Alcohol use: Not Currently  . Drug use: Never     Colonoscopy: 2017   PAP:  Bone density: never  Mammogram: 2017   Allergies  Allergen Reactions  . Morphine And Related Anaphylaxis    Current Outpatient Medications  Medication Sig Dispense Refill  . amlodipine-atorvastatin (CADUET) 10-10 MG tablet Take 1 tablet by mouth daily.    Marland Kitchen aspirin 81 MG chewable tablet Chew by mouth daily.    . fenofibrate (TRICOR) 145 MG tablet Take 145 mg by mouth daily.    Marland Kitchen gabapentin (NEURONTIN) 300 MG capsule Take 300 mg by mouth 2 (two) times daily. Pt is only taking 2 capsules a day    . Insulin Admin Supplies MISC Inject 24 Units into the skin daily.    Marland Kitchen letrozole (FEMARA) 2.5 MG tablet Take 2.5 mg by mouth daily.    Marland Kitchen loratadine (CLARITIN) 10 MG tablet Take 10 mg by mouth daily.    Marland Kitchen losartan (COZAAR) 100 MG tablet Take 100 mg by mouth daily.    . metoprolol  tartrate (LOPRESSOR) 25 MG tablet Take 1 tablet (25 mg total) by mouth daily. 90 tablet 3  . oxyCODONE-acetaminophen (PERCOCET) 10-325 MG tablet Take 1 tablet by mouth every 6 (six) hours as needed for pain. 120 tablet 0  . palbociclib (IBRANCE) 125 MG capsule Take 1 capsule (125 mg total) by mouth daily with breakfast. Take whole with food. Take for 21 days on, 7 days off, repeat every 28 days. 21 capsule 6  . pantoprazole (PROTONIX) 40 MG tablet Take 1 tablet (40 mg total) by mouth daily. 90 tablet 1  . pioglitazone (ACTOS) 15 MG tablet Take 1 tablet (15 mg total) by mouth daily. 90 tablet 3  . pravastatin (PRAVACHOL) 40 MG tablet Take 40 mg by mouth daily.    . sitaGLIPtin-metformin (JANUMET) 50-1000 MG tablet Take 1 tablet by mouth daily.     No current facility-administered medications for this visit.     OBJECTIVE: Middle-aged white woman who appears stated age 72:  09/11/18 1130  BP: 123/69  Pulse: 90  Resp: 18  Temp: 98.6 F (37 C)  SpO2: 100%     Body mass index is 39.46 kg/m.   Wt Readings from Last 3 Encounters:  09/11/18 224 lb 1.6 oz (101.7 kg)  09/02/18 225 lb (102.1 kg)  08/14/18 225 lb 6.4 oz (102.2 kg)      ECOG FS:1 - Symptomatic but completely ambulatory Sclerae unicteric, EOMs intact No cervical or supraclavicular adenopathy Lungs no rales or rhonchi Heart regular rate and rhythm Abd soft, nontender, positive bowel sounds MSK no focal spinal tenderness, no upper extremity lymphedema Neuro: nonfocal, well oriented, appropriate affect Breasts: Deferred     LAB RESULTS:  CMP     Component Value Date/Time   NA 141 09/11/2018 1106   NA 140 01/31/2018   K 4.7 09/11/2018 1106   CL 104 09/11/2018 1106   CO2 27 09/11/2018 1106   GLUCOSE 128 (H) 09/11/2018 1106   BUN 11 09/11/2018 1106   BUN 22 (A) 01/31/2018   CREATININE 0.98 09/11/2018 1106   CALCIUM 9.4 09/11/2018 1106   PROT 7.0 09/11/2018 1106   ALBUMIN 3.6 09/11/2018 1106   AST 16  09/11/2018 1106   ALT 13 09/11/2018 1106   ALKPHOS 83 09/11/2018 1106   BILITOT 0.4 09/11/2018 1106   GFRNONAA >60 09/11/2018 1106   GFRAA >60 09/11/2018 1106    No results found for: TOTALPROTELP, ALBUMINELP, A1GS, A2GS, BETS, BETA2SER, GAMS, MSPIKE, SPEI  No results found for: KPAFRELGTCHN, LAMBDASER, KAPLAMBRATIO  Lab Results  Component Value Date   WBC 3.4 (L) 09/11/2018   NEUTROABS 1.9 09/11/2018   HGB 11.3 (L) 09/11/2018   HCT 34.5 (L) 09/11/2018   MCV 105.2 (H) 09/11/2018   PLT 212 09/11/2018    '@LASTCHEMISTRY' @  No results found for: LABCA2  No components found for: [id number]  No results for input(s): INR in the last 168 hours.  No results found for: LABCA2  No results found for: CAN199  No results found for: TMA263  No results found for: FHL456  Lab Results  Component Value Date   CA2729 90.6 (H) 08/14/2018    No components found for: HGQUANT  No results found for: CEA1 / No results found for: CEA1   No results found for: AFPTUMOR  No results found for: [place]  No results found for: PSA1  Appointment on 09/11/2018  Component Date Value Ref Range Status  . Sodium 09/11/2018 141  135 - 145 mmol/L Final  . Potassium 09/11/2018 4.7  3.5 - 5.1 mmol/L Final  . Chloride 09/11/2018 104  98 - 111 mmol/L Final  . CO2 09/11/2018 27  22 - 32 mmol/L Final  . Glucose, Bld 09/11/2018 128* 70 - 99 mg/dL Final  . BUN 09/11/2018 11  8 - 23 mg/dL Final  . Creatinine, Ser 09/11/2018 0.98  0.44 - 1.00 mg/dL Final  . Calcium 09/11/2018 9.4  8.9 - 10.3 mg/dL Final  . Total Protein 09/11/2018 7.0  6.5 - 8.1 g/dL Final  . Albumin 09/11/2018 3.6  3.5 - 5.0 g/dL Final  . AST 09/11/2018 16  15 - 41 U/L Final  . ALT 09/11/2018 13  0 - 44 U/L Final  . Alkaline Phosphatase 09/11/2018 83  38 - 126 U/L Final  . Total Bilirubin 09/11/2018 0.4  0.3 - 1.2 mg/dL Final  . GFR calc non Af Amer 09/11/2018 >60  >60 mL/min Final  . GFR calc Af Amer 09/11/2018 >60  >60 mL/min  Final  . Anion gap 09/11/2018 10  5 - 15 Final   Performed at [facility], [place] [address] [place], [place] [address]  . WBC 09/11/2018 3.4* 4.0 - 10.5 K/uL Final  . RBC 09/11/2018 3.28* 3.87 - 5.11 MIL/uL Final  . Hemoglobin 09/11/2018 11.3* 12.0 - 15.0 g/dL Final  . HCT 09/11/2018 34.5* 36.0 - 46.0 % Final  . MCV 09/11/2018 105.2* 80.0 - 100.0 fL Final  . MCH 09/11/2018 34.5* 26.0 - 34.0 pg Final  . MCHC 09/11/2018 32.8  30.0 - 36.0 g/dL Final  . RDW 09/11/2018 14.1  11.5 - 15.5 % Final  . Platelets 09/11/2018 212  150 - 400 K/uL Final  . nRBC 09/11/2018 0.0  0.0 - 0.2 % Final  . Neutrophils Relative % 09/11/2018 59  % Final  . Neutro Abs 09/11/2018 1.9  1.7 - 7.7 K/uL Final  . Lymphocytes Relative 09/11/2018 23  % Final  . Lymphs Abs 09/11/2018 0.8  0.7 - 4.0 K/uL Final  . Monocytes Relative 09/11/2018 15  % Final  . Monocytes Absolute 09/11/2018 0.5  0.1 - 1.0 K/uL Final  . Eosinophils Relative 09/11/2018 1  % Final  . Eosinophils Absolute 09/11/2018 0.0  0.0 - 0.5 K/uL Final  . Basophils Relative 09/11/2018 2  % Final  . Basophils Absolute 09/11/2018 0.1  0.0 - 0.1 K/uL Final  . Immature Granulocytes 09/11/2018 0  % Final  . Abs Immature Granulocytes 09/11/2018 0.01  0.00 - 0.07 K/uL Final   Performed at [facility], [place] [address], [place], [place] [address]    (this displays the last labs from the last 3 days)  No results found for: TOTALPROTELP, ALBUMINELP, A1GS, A2GS, BETS, BETA2SER, GAMS, MSPIKE, SPEI (this displays SPEP labs)  No results found for: KPAFRELGTCHN, LAMBDASER, KAPLAMBRATIO (kappa/lambda light chains)  No results found for: HGBA, HGBA2QUANT, HGBFQUANT, HGBSQUAN (Hemoglobinopathy evaluation)   No results found for: LDH  No results found for: IRON, TIBC, IRONPCTSAT (Iron and TIBC)  No results found for: FERRITIN  Urinalysis No results found for: COLORURINE, APPEARANCEUR, LABSPEC, PHURINE, GLUCOSEU,  HGBUR, BILIRUBINUR, KETONESUR, PROTEINUR, UROBILINOGEN, NITRITE, LEUKOCYTESUR   STUDIES: Ct Chest W Contrast  Result Date: 09/06/2018 CLINICAL DATA:  Breast cancer for over 20 years. Stage IV for the past 11 years. On oral chemotherapy. History of IV chemotherapy and radiation therapy to chest/spine. EXAM: CT CHEST, ABDOMEN, AND PELVIS WITH CONTRAST TECHNIQUE: Multidetector CT imaging of the chest, abdomen and pelvis was performed following the standard protocol during bolus administration of intravenous contrast. CONTRAST:  1105m OMNIPAQUE IOHEXOL 300 MG/ML SOLN, 333mOMNIPAQUE IOHEXOL 300 MG/ML SOLN COMPARISON:  spine MRI of 08/18/2018. FINDINGS: CT CHEST FINDINGS Cardiovascular: Aortic atherosclerosis. Tortuous thoracic aorta. Normal heart size with mild lipomatous hypertrophy of the interatrial septum. No pericardial effusion. Multivessel coronary artery atherosclerosis. No central pulmonary embolism, on this non-dedicated study. Mediastinum/Nodes: No supraclavicular adenopathy. Right axillary node dissection. No axillary adenopathy. A left subpectoral node is prominent but not pathologic by size criteria at 8 mm on image 14/2. No mediastinal or hilar adenopathy.  No internal mammary adenopathy. Lungs/Pleura: Trace left pleural fluid or thickening. Left hemidiaphragm elevation is moderate. Anterior right upper lobe and right apical radiation induced fibrosis. Left lower lobe volume loss and atelectasis or scar. Subpleural pulmonary nodules of 2 mm in the left apex on image 12/6 and left upper lobe on image 23/6. A 5 mm left upper lobe nodule on image 40/6. Musculoskeletal: Right mastectomy  Multifocal sclerotic osseous metastasis, as detailed on prior MRI. Examples in the T11 and T8 vertebral bodies. CT ABDOMEN PELVIS FINDINGS Hepatobiliary: Mild hepatic steatosis with sparing adjacent the gallbladder. No focal liver lesion. Normal gallbladder, without biliary ductal dilatation. Pancreas: Mild pancreatic  atrophy, without duct dilatation or acute inflammation. Spleen: Normal in size, without focal abnormality. Adrenals/Urinary Tract: Normal adrenal glands. Normal kidneys, without hydronephrosis. Normal urinary bladder. Stomach/Bowel: Normal stomach, without wall thickening. Scattered colonic diverticula. Normal terminal ileum and appendix. Normal small bowel. Vascular/Lymphatic: Aortic and branch vessel atherosclerosis. No abdominopelvic adenopathy. Reproductive: Hysterectomy.  No adnexal mass. Other: No significant free fluid.  Mild pelvic floor laxity. Mixed attenuation 1.1 cm nodule medial to the ascending colon on image 83/2. Musculoskeletal: Extensive osseous metastasis. Example lytic lesion within the posterior portion of the L5 vertebral body at 2.2 x 1.9 cm. Pathologic fracture at L4 is grossly similar to 08/18/2018 MRI. IMPRESSION: 1. Right mastectomy, with widespread osseous metastasis. 2. Isolated prominent but not pathologically enlarged left subpectoral lymph node, indeterminate. Recommend attention on follow-up. 3. Mixed attenuation nodule medial to the ascending colon is favored to represent a benign calcified lymph node or possibly contrast within a diverticulum. Isolated omental/peritoneal metastasis felt unlikely but cannot be excluded. 4. Nonspecific left-sided pulmonary nodules. 5. Age advanced coronary artery atherosclerosis. Recommend assessment of coronary risk factors and consideration of medical therapy. 6.  Aortic Atherosclerosis (ICD10-I70.0). 7. Left hemidiaphragm elevation with overlying volume loss and trace pleural fluid/thickening. 8. Mild hepatic steatosis. Electronically Signed   By: Abigail Miyamoto M.D.   On: 09/06/2018 12:52   Nm Bone Scan Whole Body  Result Date: 09/08/2018 CLINICAL DATA:  Breast cancer with known bone Mets. The patient is complaining of chronic low back and right hip pain. EXAM: NUCLEAR MEDICINE WHOLE BODY BONE SCAN TECHNIQUE: Whole body anterior and posterior  images were obtained approximately 3 hours after intravenous injection of radiopharmaceutical. RADIOPHARMACEUTICALS:  22 mCi Technetium-2mMDP IV COMPARISON:  CT scan of the chest, abdomen, and pelvis of September 05, 2018 FINDINGS: There is adequate uptake of the radiopharmaceutical by the skeleton. There is adequate soft tissue clearance and renal activity. Within the calvarium there is a focus of increased uptake posteriorly to the left in the posterior parietal bone. Mildly increased uptake in the anterior temporal region on the right is suspected. There's increased uptake within the body of the sternum as well as in the mid and lower thoracic spine and lower lumbar spine. There is increased uptake within the anterior aspect of the left second rib and anterior aspect of the right seventh rib. There is focally increased uptake in the Peri glenoid region of the right scapula there is markedly increased uptake within the lateral aspect of the right iliac bone extending into the ischium and in the left inferior ischium, periacetabular region, and inferior pubic ramus. Minimally increased uptake about the right knee and both ankles is likely degenerative. IMPRESSION: Widespread bony metastatic disease as described. Electronically Signed   By: David  JMartiniqueM.D.   On: 09/08/2018 07:27   Ct Abdomen Pelvis W Contrast  Result Date: 09/06/2018 CLINICAL DATA:  Breast cancer for over 20 years. Stage IV for the past 11 years. On oral chemotherapy. History of IV chemotherapy and radiation therapy to chest/spine. EXAM: CT CHEST, ABDOMEN, AND PELVIS WITH CONTRAST TECHNIQUE: Multidetector CT imaging of the chest, abdomen and pelvis was performed following the standard protocol during bolus administration of intravenous contrast. CONTRAST:  1042mOMNIPAQUE IOHEXOL 300 MG/ML SOLN, 3070m  OMNIPAQUE IOHEXOL 300 MG/ML SOLN COMPARISON:  spine MRI of 08/18/2018. FINDINGS: CT CHEST FINDINGS Cardiovascular: Aortic atherosclerosis.  Tortuous thoracic aorta. Normal heart size with mild lipomatous hypertrophy of the interatrial septum. No pericardial effusion. Multivessel coronary artery atherosclerosis. No central pulmonary embolism, on this non-dedicated study. Mediastinum/Nodes: No supraclavicular adenopathy. Right axillary node dissection. No axillary adenopathy. A left subpectoral node is prominent but not pathologic by size criteria at 8 mm on image 14/2. No mediastinal or hilar adenopathy.  No internal mammary adenopathy. Lungs/Pleura: Trace left pleural fluid or thickening. Left hemidiaphragm elevation is moderate. Anterior right upper lobe and right apical radiation induced fibrosis. Left lower lobe volume loss and atelectasis or scar. Subpleural pulmonary nodules of 2 mm in the left apex on image 12/6 and left upper lobe on image 23/6. A 5 mm left upper lobe nodule on image 40/6. Musculoskeletal: Right mastectomy Multifocal sclerotic osseous metastasis, as detailed on prior MRI. Examples in the T11 and T8 vertebral bodies. CT ABDOMEN PELVIS FINDINGS Hepatobiliary: Mild hepatic steatosis with sparing adjacent the gallbladder. No focal liver lesion. Normal gallbladder, without biliary ductal dilatation. Pancreas: Mild pancreatic atrophy, without duct dilatation or acute inflammation. Spleen: Normal in size, without focal abnormality. Adrenals/Urinary Tract: Normal adrenal glands. Normal kidneys, without hydronephrosis. Normal urinary bladder. Stomach/Bowel: Normal stomach, without wall thickening. Scattered colonic diverticula. Normal terminal ileum and appendix. Normal small bowel. Vascular/Lymphatic: Aortic and branch vessel atherosclerosis. No abdominopelvic adenopathy. Reproductive: Hysterectomy.  No adnexal mass. Other: No significant free fluid.  Mild pelvic floor laxity. Mixed attenuation 1.1 cm nodule medial to the ascending colon on image 83/2. Musculoskeletal: Extensive osseous metastasis. Example lytic lesion within the  posterior portion of the L5 vertebral body at 2.2 x 1.9 cm. Pathologic fracture at L4 is grossly similar to 08/18/2018 MRI. IMPRESSION: 1. Right mastectomy, with widespread osseous metastasis. 2. Isolated prominent but not pathologically enlarged left subpectoral lymph node, indeterminate. Recommend attention on follow-up. 3. Mixed attenuation nodule medial to the ascending colon is favored to represent a benign calcified lymph node or possibly contrast within a diverticulum. Isolated omental/peritoneal metastasis felt unlikely but cannot be excluded. 4. Nonspecific left-sided pulmonary nodules. 5. Age advanced coronary artery atherosclerosis. Recommend assessment of coronary risk factors and consideration of medical therapy. 6.  Aortic Atherosclerosis (ICD10-I70.0). 7. Left hemidiaphragm elevation with overlying volume loss and trace pleural fluid/thickening. 8. Mild hepatic steatosis. Electronically Signed   By: Abigail Miyamoto M.D.   On: 09/06/2018 12:52   Mr Total Spine Mets Screening  Result Date: 08/18/2018 CLINICAL DATA:  Metastatic breast cancer. Assess response to neoadjuvant chemotherapy. EXAM: MRI TOTAL SPINE WITHOUT AND WITH CONTRAST TECHNIQUE: Multisequence MR imaging of the spine from the cervical spine to the sacrum was performed prior to and following IV contrast administration for evaluation of spinal metastatic disease. Sagittal imaging was performed of the entire spine while axial imaging was only performed of the lumbar spine. CONTRAST:  10 mL Gadavist COMPARISON:  03/21/2018 FINDINGS: MRI CERVICAL SPINE FINDINGS Alignment: Cervical spine straightening. Trace retrolisthesis of C3 on C4 and C4 on C5. Vertebrae: Congenital C2-3 fusion. No fracture or suspicious osseous lesion. Cord: No cord signal abnormality or abnormal intradural enhancement identified. Posterior Fossa, vertebral arteries, paraspinal tissues: Unremarkable on this limited examination. Disc levels: Mild cervical spondylosis  without high-grade spinal stenosis. MRI THORACIC SPINE FINDINGS Alignment:  Normal. Vertebrae: Similar appearance of T1 and T2 hypointense lesions in the T1, T8, T10, and T11 vertebral bodies without enhancement. No evidence of new lesion or pathologic  fracture. Cord: No cord signal abnormality or abnormal intradural enhancement identified. Paraspinal and other soft tissues: Unremarkable on this limited study. Disc levels: No significant degenerative changes. MRI LUMBAR SPINE FINDINGS Segmentation: Counting down from the craniocervical junction, the lowest fully formed intervertebral disc space is L5-S1. Alignment:  Trace retrolisthesis of L5 on S1. Vertebrae: Unchanged small sclerotic lesion in the L1 vertebral body, metastasis versus bone island. Unchanged nonenhancing L2 vertebral body lesion. Increased size of enhancing L3 vertebral body lesion, now 2.4 cm. Persistent large enhancing L4 vertebral body lesion with new superior endplate compression fracture with 35% height loss and 3-4 mm retropulsion. Mildly increased prominence of enhancement extending into the right L4 pedicle. Mild enlargement of an enhancing L5 vertebral body now occupying essentially the entirety of the vertebral body with extension into the left pedicle. Chronic L5 superior endplate Schmorl's node. No epidural tumor identified. Conus medullaris: Extends to the L1 level and appears normal. Paraspinal and other soft tissues: Subcentimeter left renal cyst. Disc levels: Mild spinal stenosis at L4 due to vertebral body retropulsion and facet and ligamentum flavum hypertrophy. Mild bilateral lateral recess and mild left neural foraminal stenosis at L4-5 due to disc bulging and facet hypertrophy. IMPRESSION: 1. Interval enlargement of L3-L5 vertebral metastases. 2. New pathologic L4 superior endplate fracture with 58% height loss, mild retropulsion, and mild spinal stenosis. 3. Unchanged size of thoracic spine metastases without significant  enhancement. 4. No epidural tumor identified. Electronically Signed   By: Logan Bores M.D.   On: 08/18/2018 14:19   Dg Hip Unilat W Or W/o Pelvis 1 View Right  Result Date: 08/14/2018 CLINICAL DATA:  RIGHT hip pain, metastatic breast cancer EXAM: DG HIP (WITH OR WITHOUT PELVIS) 1V RIGHT COMPARISON:  None FINDINGS: Hip and SI joint spaces preserved. Mixed sclerotic and lytic foci are identified at the RIGHT iliac crest and at the LEFT ischium consistent with osseous metastases. No acute fracture or dislocation. Minimal degenerative changes at the pubic symphysis. Suspected sclerotic lesion at L4, which appears radiodense versus L5. IMPRESSION: Osseous metastases involving the RIGHT iliac crest, LEFT ischium, and probably L4 vertebral body. Electronically Signed   By: Lavonia Dana M.D.   On: 08/14/2018 17:11    ELIGIBLE FOR AVAILABLE RESEARCH PROTOCOL: no  ASSESSMENT: 62 y.o. [place], [place] woman with stage IV breast cancer, as follows:  (1) status post right mastectomy in 2000, for a pT2 pN2, stage IIIA invasive ductal carcinoma, estrogen receptor positive, progesterone receptor not tested, HER-2 negative (0) by immunohistochemistry  (a) adjuvant chemotherapy with doxorubicin and cyclophosphamide in dose dense fashion x4 followed by paclitaxel in dose dense fashion x4  (b) adjuvant radiation: 30 doses  (c) antiestrogens: Tamoxifen for 5 years, completed 2005  (2) METASTASTIC DISEASE: 2008, involving bones, lungs, and lymph nodes  (a) CA 27-29 informative  (b) CT of the chest abdomen and pelvis in [facility] finds stable sclerotic metastases (T8, T10, T11, sternum, L4, L5); 0.6 cm left upper lobe lung nodule stable  (3)  prior anti-estrogen treatments:  (a) fulvestrant--progression  (b) exemestane/everolimus--hyperlipidemia, hyperglycemia  (4) prior chemotherapy treatments:  (a) capecitabine: progression  (b) Abraxane, August 2014 through 11/18/2015: good response but stopped due  to neuropathy  (c) gemcitabine 01/20/2016--with multiple interruptions secondary to infections  (5) radiation therapy:  (a) T spine and Right femur, completed 01/10/2016  (6) letrozole/ palbociclib started 07/13/2016  (7) bone treatment:  (a) denosumab/Xgeva--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  Her tumor is stable and it is favorable that there is no evidence of involvement of the liver or lungs at this point.  She does appear to have had disease progression in the lumbar spine and she is symptomatic in that area.  I think she might benefit from radiation to that area and I have placed a referral with that in mind.  She comes every month to get her labs so she can start her next cycle of palbociclib and also she gets her pain medicines refilled at the same time.  She has been very stable in regards to that  Her husband has been diagnosed with prostate cancer and will be having surgery mid January so after I see her on the January 9 visit we will probably go to every 56-monthvisits so long as she remains relatively stable  She knows to call for any other problems that may develop before then.  Ethelene Closser, GVirgie Dad MD  09/11/18 12:13 PM Medical Oncology and Hematology [id number] [id number]. [phone]   Fax. [phone]  I, AJacqualyn Poseyam acting as a sEducation administratorfor GChauncey Cruel MD.   I,  GLurline DelMD, have reviewed the above documentation for accuracy and completeness, and I agree with the above.

## 2018-09-11 ENCOUNTER — Telehealth: Payer: Self-pay | Admitting: Oncology

## 2018-09-11 ENCOUNTER — Inpatient Hospital Stay (HOSPITAL_BASED_OUTPATIENT_CLINIC_OR_DEPARTMENT_OTHER): Payer: MEDICARE | Admitting: Oncology

## 2018-09-11 ENCOUNTER — Inpatient Hospital Stay: Payer: MEDICARE | Attending: Oncology

## 2018-09-11 VITALS — BP 123/69 | HR 90 | Temp 98.6°F | Resp 18 | Ht 63.19 in | Wt 224.1 lb

## 2018-09-11 DIAGNOSIS — Z79899 Other long term (current) drug therapy: Secondary | ICD-10-CM | POA: Diagnosis not present

## 2018-09-11 DIAGNOSIS — G629 Polyneuropathy, unspecified: Secondary | ICD-10-CM | POA: Diagnosis not present

## 2018-09-11 DIAGNOSIS — E785 Hyperlipidemia, unspecified: Secondary | ICD-10-CM | POA: Insufficient documentation

## 2018-09-11 DIAGNOSIS — R232 Flushing: Secondary | ICD-10-CM | POA: Diagnosis not present

## 2018-09-11 DIAGNOSIS — Z7982 Long term (current) use of aspirin: Secondary | ICD-10-CM | POA: Insufficient documentation

## 2018-09-11 DIAGNOSIS — T451X5A Adverse effect of antineoplastic and immunosuppressive drugs, initial encounter: Secondary | ICD-10-CM

## 2018-09-11 DIAGNOSIS — Z90722 Acquired absence of ovaries, bilateral: Secondary | ICD-10-CM | POA: Diagnosis not present

## 2018-09-11 DIAGNOSIS — I10 Essential (primary) hypertension: Secondary | ICD-10-CM | POA: Insufficient documentation

## 2018-09-11 DIAGNOSIS — K760 Fatty (change of) liver, not elsewhere classified: Secondary | ICD-10-CM | POA: Diagnosis not present

## 2018-09-11 DIAGNOSIS — C7951 Secondary malignant neoplasm of bone: Secondary | ICD-10-CM

## 2018-09-11 DIAGNOSIS — C7800 Secondary malignant neoplasm of unspecified lung: Secondary | ICD-10-CM

## 2018-09-11 DIAGNOSIS — C779 Secondary and unspecified malignant neoplasm of lymph node, unspecified: Secondary | ICD-10-CM | POA: Diagnosis not present

## 2018-09-11 DIAGNOSIS — M879 Osteonecrosis, unspecified: Secondary | ICD-10-CM

## 2018-09-11 DIAGNOSIS — Z794 Long term (current) use of insulin: Secondary | ICD-10-CM | POA: Insufficient documentation

## 2018-09-11 DIAGNOSIS — I700 Atherosclerosis of aorta: Secondary | ICD-10-CM

## 2018-09-11 DIAGNOSIS — E669 Obesity, unspecified: Secondary | ICD-10-CM | POA: Diagnosis not present

## 2018-09-11 DIAGNOSIS — C50911 Malignant neoplasm of unspecified site of right female breast: Secondary | ICD-10-CM | POA: Insufficient documentation

## 2018-09-11 DIAGNOSIS — Z803 Family history of malignant neoplasm of breast: Secondary | ICD-10-CM | POA: Insufficient documentation

## 2018-09-11 DIAGNOSIS — C50919 Malignant neoplasm of unspecified site of unspecified female breast: Secondary | ICD-10-CM

## 2018-09-11 DIAGNOSIS — Z9011 Acquired absence of right breast and nipple: Secondary | ICD-10-CM | POA: Insufficient documentation

## 2018-09-11 DIAGNOSIS — Z170 Estrogen receptor positive status [ER+]: Secondary | ICD-10-CM | POA: Insufficient documentation

## 2018-09-11 DIAGNOSIS — R739 Hyperglycemia, unspecified: Secondary | ICD-10-CM | POA: Diagnosis not present

## 2018-09-11 DIAGNOSIS — G893 Neoplasm related pain (acute) (chronic): Secondary | ICD-10-CM | POA: Insufficient documentation

## 2018-09-11 DIAGNOSIS — Z90710 Acquired absence of both cervix and uterus: Secondary | ICD-10-CM | POA: Insufficient documentation

## 2018-09-11 DIAGNOSIS — I2510 Atherosclerotic heart disease of native coronary artery without angina pectoris: Secondary | ICD-10-CM

## 2018-09-11 DIAGNOSIS — G620 Drug-induced polyneuropathy: Secondary | ICD-10-CM

## 2018-09-11 LAB — COMPREHENSIVE METABOLIC PANEL
ALT: 13 U/L (ref 0–44)
ANION GAP: 10 (ref 5–15)
AST: 16 U/L (ref 15–41)
Albumin: 3.6 g/dL (ref 3.5–5.0)
Alkaline Phosphatase: 83 U/L (ref 38–126)
BILIRUBIN TOTAL: 0.4 mg/dL (ref 0.3–1.2)
BUN: 11 mg/dL (ref 8–23)
CHLORIDE: 104 mmol/L (ref 98–111)
CO2: 27 mmol/L (ref 22–32)
CREATININE: 0.98 mg/dL (ref 0.44–1.00)
Calcium: 9.4 mg/dL (ref 8.9–10.3)
GFR calc Af Amer: >60 mL/min (ref >60)
GFR calc non Af Amer: >60 mL/min (ref >60)
Glucose, Bld: 128 mg/dL — ABNORMAL HIGH (ref 70–99)
POTASSIUM: 4.7 mmol/L (ref 3.5–5.1)
SODIUM: 141 mmol/L (ref 135–145)
TOTAL PROTEIN: 7 g/dL (ref 6.5–8.1)

## 2018-09-11 LAB — CBC WITH DIFFERENTIAL/PLATELET
Abs Immature Granulocytes: 0.01 10*3/uL (ref 0.00–0.07)
Basophils Absolute: 0.1 10*3/uL (ref 0.0–0.1)
Basophils Relative: 2 %
EOS ABS: 0 10*3/uL (ref 0.0–0.5)
Eosinophils Relative: 1 %
HCT: 34.5 % — ABNORMAL LOW (ref 36.0–46.0)
Hemoglobin: 11.3 g/dL — ABNORMAL LOW (ref 12.0–15.0)
Immature Granulocytes: 0 %
Lymphocytes Relative: 23 %
Lymphs Abs: 0.8 10*3/uL (ref 0.7–4.0)
MCH: 34.5 pg — AB (ref 26.0–34.0)
MCHC: 32.8 g/dL (ref 30.0–36.0)
MCV: 105.2 fL — AB (ref 80.0–100.0)
Monocytes Absolute: 0.5 10*3/uL (ref 0.1–1.0)
Monocytes Relative: 15 %
NEUTROS ABS: 1.9 10*3/uL (ref 1.7–7.7)
NEUTROS PCT: 59 %
NRBC: 0 % (ref 0.0–0.2)
Platelets: 212 10*3/uL (ref 150–400)
RBC: 3.28 MIL/uL — AB (ref 3.87–5.11)
RDW: 14.1 % (ref 11.5–15.5)
WBC: 3.4 10*3/uL — AB (ref 4.0–10.5)

## 2018-09-11 MED ORDER — OXYCODONE-ACETAMINOPHEN 10-325 MG PO TABS: 1.0000 | ORAL_TABLET | Freq: Four times a day (QID) | ORAL | 0 refills | Status: DC | PRN

## 2018-09-11 NOTE — Telephone Encounter (Signed)
Gave avs and calendar ° °

## 2018-09-12 LAB — CANCER ANTIGEN 27.29: CAN 27.29: 102.3 U/mL — AB (ref 0.0–38.6)

## 2018-09-19 ENCOUNTER — Other Ambulatory Visit: Payer: Self-pay

## 2018-09-19 ENCOUNTER — Encounter: Payer: Self-pay | Admitting: Family Medicine

## 2018-09-19 MED ORDER — INSULIN PEN NEEDLE 31G X 5 MM MISC: 99 refills | Status: DC

## 2018-09-23 ENCOUNTER — Other Ambulatory Visit: Payer: Self-pay

## 2018-09-23 MED ORDER — METOPROLOL TARTRATE 25 MG PO TABS: 25.0000 mg | ORAL_TABLET | Freq: Every day | ORAL | 3 refills | Status: DC

## 2018-09-23 NOTE — Progress Notes (Signed)
Histology and Location of Primary Cancer: Estrogen receptor positive stage IV breast cancer  Sites of Visceral and Bony Metastatic Disease: Per Bone Scan 09/08/18: Within the calvarium there is a focus of increased uptake posteriorly to the left in the posterior parietal bone. Mildly increased uptake in the anterior temporal region on the right is suspected. There's increased uptake within the body of the sternum as well as in the mid and lower thoracic spine and lower lumbar spine. There is increased uptake within the anterior aspect of the left second rib and anterior aspect of the right seventh rib. There is focally increased uptake in the Peri glenoid region of the right scapula there is markedly increased uptake within the lateral aspect of the right iliac bone extending into the ischium and in the left inferior ischium, periacetabular region, and inferior pubic ramus. Minimally increased uptake about the right knee and both ankles is likely degenerative.  Location(s) of Symptomatic Metastases:  Per MRI Total Spine 08/18/18:  IMPRESSION: 1. Interval enlargement of L3-L5 vertebral metastases. 2. New pathologic L4 superior endplate fracture with 55% height loss, mild retropulsion, and mild spinal stenosis. 3. Unchanged size of thoracic spine metastases without significant enhancement. 4. No epidural tumor identified.   Past/Anticipated chemotherapy by medical oncology, if any: Per Dr. [name] 09/11/18: ASSESSMENT: 62 y.o. [place], [place] woman with stage IV breast cancer, as follows:  (1) status post right mastectomy in 2000, for a pT2 pN2, stage IIIA invasive ductal carcinoma, estrogen receptor positive, progesterone receptor not tested, HER-2 negative (0) by immunohistochemistry             (a) adjuvant chemotherapy with doxorubicin and cyclophosphamide in dose dense fashion x4 followed by paclitaxel in dose dense fashion x4             (b) adjuvant radiation: 30 doses              (c) antiestrogens: Tamoxifen for 5 years, completed 2005  (2) METASTASTIC DISEASE: 2008, involving bones, lungs, and lymph nodes             (a) CA 27-29 informative             (b) CT of the chest abdomen and pelvis in [facility] finds stable sclerotic metastases (T8, T10, T11, sternum, L4, L5); 0.6 cm left upper lobe lung nodule stable  (3)  prior anti-estrogen treatments:             (a) fulvestrant--progression             (b) exemestane/everolimus--hyperlipidemia, hyperglycemia  (4) prior chemotherapy treatments:             (a) capecitabine: progression             (b) Abraxane, August 2014 through 11/18/2015: good response but stopped due to neuropathy             (c) gemcitabine 01/20/2016--with multiple interruptions secondary to infections  (5) radiation therapy:             (a) T spine and Right femur, completed 01/10/2016  (6) letrozole/ palbociclib started 07/13/2016  (7) bone treatment:             (a) denosumab/Xgeva--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  Her tumor is stable and it is favorable that there is no evidence of involvement of the liver or lungs at this point.  She does appear to have had disease progression in the lumbar spine and she is symptomatic in that area.  I think she might benefit from radiation to that area and I have placed a referral with that in  mind.  She comes every month to get her labs so she can start her next cycle of palbociclib and also she gets her pain medicines refilled at the same time.  She has been very stable in regards to that  Pain on a scale of 0-10 is: Pt describes pain radiating from lower back around throughout pelvis. Pt rates pain 2/10.   If Spine Met(s), symptoms, if any, include:  Bowel/Bladder retention or incontinence (please describe): Pt denies  Numbness or weakness in extremities (please describe): Numbness in bilateral midfoot and tingling right hand.  Current Decadron regimen, if applicable: None  Ambulatory status? Walker? Wheelchair?: Ambulatory  SAFETY ISSUES:  Prior radiation? RIGHT breast adjuvant radiation 30 doses;  Radiation therapy to T spine and RIGHT femur completed 01/10/16  Pacemaker/ICD? No  Possible current pregnancy? No, pt is post-surgical  Is the patient on methotrexate? No  Current Complaints / other details:  Pt presents today for initial consult with Dr. Sondra Come for Radiation Oncology. Pt is unaccompanied. Pt scored 1/10 on distress screen. SW consult offered, pt declined.   BP 140/70 (BP Location: Left Arm, Patient Position: Sitting)   Pulse 76   Temp 98.4 F (36.9 C) (Oral)   Resp 18   Ht '5\' 4"'  (1.626 m)   Wt 225 lb 6 oz (102.2 kg)   LMP 10/01/2006 Comment: Total Hysterectomy  SpO2 95%   BMI 38.69 kg/m   Wt Readings from Last 3 Encounters:  10/02/18 225 lb 6 oz (102.2 kg)  09/11/18 224 lb 1.6 oz (101.7 kg)  09/02/18 225 lb (102.1 kg)   Loma Sousa, RN BSN

## 2018-09-30 ENCOUNTER — Encounter: Payer: Self-pay | Admitting: Family Medicine

## 2018-09-30 MED ORDER — METOPROLOL SUCCINATE ER 25 MG PO TB24: 25.0000 mg | ORAL_TABLET | Freq: Every day | ORAL | 1 refills | Status: DC

## 2018-10-02 ENCOUNTER — Encounter: Payer: Self-pay | Admitting: Radiation Oncology

## 2018-10-02 ENCOUNTER — Other Ambulatory Visit: Payer: Self-pay

## 2018-10-02 ENCOUNTER — Ambulatory Visit
Admission: RE | Admit: 2018-10-02 | Discharge: 2018-10-02 | Disposition: A | Discharge status: Home / Self Care | Payer: MEDICARE | Source: Ambulatory Visit | Attending: Radiation Oncology | Admitting: Radiation Oncology

## 2018-10-02 VITALS — BP 140/70 | HR 76 | Temp 98.4°F | Resp 18 | Ht 64.0 in | Wt 225.4 lb

## 2018-10-02 DIAGNOSIS — Z510 Encounter for antineoplastic radiation therapy: Secondary | ICD-10-CM | POA: Insufficient documentation

## 2018-10-02 DIAGNOSIS — C50911 Malignant neoplasm of unspecified site of right female breast: Secondary | ICD-10-CM | POA: Diagnosis not present

## 2018-10-02 DIAGNOSIS — Z85830 Personal history of malignant neoplasm of bone: Secondary | ICD-10-CM | POA: Diagnosis not present

## 2018-10-02 DIAGNOSIS — Z170 Estrogen receptor positive status [ER+]: Secondary | ICD-10-CM | POA: Insufficient documentation

## 2018-10-02 DIAGNOSIS — C7951 Secondary malignant neoplasm of bone: Secondary | ICD-10-CM

## 2018-10-02 DIAGNOSIS — Z923 Personal history of irradiation: Secondary | ICD-10-CM | POA: Diagnosis not present

## 2018-10-02 DIAGNOSIS — C50919 Malignant neoplasm of unspecified site of unspecified female breast: Secondary | ICD-10-CM

## 2018-10-02 DIAGNOSIS — Z853 Personal history of malignant neoplasm of breast: Secondary | ICD-10-CM | POA: Diagnosis not present

## 2018-10-02 DIAGNOSIS — M8458XA Pathological fracture in neoplastic disease, other specified site, initial encounter for fracture: Secondary | ICD-10-CM | POA: Diagnosis not present

## 2018-10-02 NOTE — Progress Notes (Signed)
Radiation Oncology         ([phone]) [phone] ________________________________  Initial Outpatient Consultation  Name: Mia [name] MRN: [id number]  Date: 10/02/2018  DOB: [birth year]  JK:KXFG, Mia Sequin, MD  Magrinat, Virgie Dad, MD   REFERRING PHYSICIAN: Magrinat, Virgie Dad, MD  DIAGNOSIS: The primary encounter diagnosis was Metastatic breast cancer [facility]). A diagnosis of Bone metastases ([place]) was also pertinent to this visit.  HISTORY OF PRESENT ILLNESS::Mia [name] is a 63 y.o. female who is here today for her metastatic breast cancer. The medical records from [facility] are detailed below:  Mia [name] was initially diagnosed with Stage IIIA (T2N2M0) invasive ductal carcinoma, estrogen receptor positive and HER2 negative right breast cancer in 2000. She underwent right mastectomy with 4 positive metastatic lymph nodes. She received chemotherapy and radiation and completed 5 years of tamoxifen.   She was diagnosed with Stage IV disease in July 2006 with metastases to the bone, lung, and additional nodes. She was on capecitabine but was discontinued after rising CA 27-29 and scans.   She started anastrozole and everolimus with Delton See in April 2014. She tolerated this well, but this was discontinued in July 2014 due to abnormal blood tests, hyperlipidemia, and hyperglycemia.  She began Abraxane and Xgeva in August 2014. She tolerated this treatment well. She discontinued Xgeva in January 2016 due to osteonecrosis of the jaw and mouth issues. She continued abraxane alone until her last dose on 11/18/2015 due to neuropathy and disease progression on restaging studies from 11/23/2015.  She was switched to gemcitabine starting 01/20/2016. This was discontinued on 06/15/2016 due to a port related jugular clot on the right side. She was given Lovenox for 3 months.  She was switched to Letrozole 2.5 mg and palbociclib 125 mg starting 07/13/2016. She tolerated this well. Restaging  studies with CT chest abdomen and pelvis and bone scan on 07/01/2017 showed stable/ improving lesions. CA 27-29 was stable.  She moved to [place] from [place] in May 2019 and was evaluated by Dr. [name] in the breast cancer clinic on 03/12/2018.  During the previous few months of follow up in [place], she completed restaging studies with a CT chest abdomen and pelvis at [facility] on 12/17/2017 showing: A 6 mm pulmonary nodule in the left upper lobe laterally. Progression of metastatic disease to the bones at the L4 and L5 vertebral bodies. Sclerotic metastases at T8, T10, an T11, are similar to previous exams. Sclerotic metastases in the sternum stable.   MRI of the spine on 03/21/18 showed nonenhancing T1 and T8 metastases and multiple enhancing and nonenhancing metastases in the lumbar spine.  Restaging MRI of the spine on 08/18/18 showed interval enlargement of L3-L5 vertebral metastases and a new pathologic L4 superior endplate fracture with 18% height loss. Unchanged size of thoracic spine metastases without significant enhancement. No epidural tumor identified.  Bone scan on 09/05/18 showed widespread metastatic disease: Within the calvarium there is a focus of increased uptake posteriorly to the left in the posterior parietal bone. Mildly increased uptake in the anterior temporal region on the right is suspected. There's increased uptake within the body of the sternum as well as in the mid and lower thoracic spine and lower lumbar spine. There is increased uptake within the anterior aspect of the left second rib and anterior aspect of the right seventh rib. There is focally increased uptake in the Peri glenoid region of the right scapula there is markedly increased uptake within the lateral aspect of the  right iliac bone extending into the ischium and in the left inferior ischium, periacetabular region, and inferior pubic ramus. Minimally increased uptake about the right knee and both ankles  is likely degenerative.  No evidence of progression in the lung, lymph node, or liver lesions on CT C/A/P scans in December 2019.  The patient continues on Ibrance and letrozole. She has kindly been referred today for discussion of potential radiation treatment options due to symptomatic disease progression in the lumbar spine.  On review of systems, the patient reports steady pain in her lower back. She reports bilateral pelvic pain, L>R. She reports the pain is well-controlled with Percocet 3 times per day and Neurontin twice a day. She reports chronic lymphedema in her right arm since her mastectomy and uses a sleeve.  PREVIOUS RADIATION THERAPY: Yes - Given to right chest wall status post mastectomy in 2000, to the thoracic spine in 2017, and to the right femur in 2017, done in [place]:  has a past medical history of Breast cancer metastasized to bone ([place]), Hyperlipidemia, Hypertension, Lymphedema of right arm, Neuropathy associated with cancer ([place]), and Obesity (BMI 35.0-39.9 without comorbidity).    PAST SURGICAL HISTORY: Past Surgical History:  Procedure Laterality Date  . CATARACT EXTRACTION Left   . HYSTERECTOMY ABDOMINAL WITH SALPINGO-OOPHORECTOMY    . MODIFIED RADICAL MASTECTOMY Right     FAMILY HISTORY: family history is not on file.  SOCIAL HISTORY:  reports that she has never smoked. She has never used smokeless tobacco. She reports previous alcohol use. She reports that she does not use drugs.  ALLERGIES: Morphine and related  MEDICATIONS:  Current Outpatient Medications  Medication Sig Dispense Refill  . amlodipine-atorvastatin (CADUET) 10-10 MG tablet Take 1 tablet by mouth daily.    Marland Kitchen aspirin 81 MG chewable tablet Chew by mouth daily.    . fenofibrate (TRICOR) 145 MG tablet Take 145 mg by mouth daily.    Marland Kitchen gabapentin (NEURONTIN) 300 MG capsule Take 300 mg by mouth 2 (two) times daily. Pt is only taking 2 capsules a day    . Insulin Admin  Supplies MISC Inject 24 Units into the skin daily.    . Insulin Pen Needle 31G X 5 MM MISC UAD for Sq inj for DM qd 100 each PRN  . letrozole (FEMARA) 2.5 MG tablet Take 2.5 mg by mouth daily.    Marland Kitchen loratadine (CLARITIN) 10 MG tablet Take 10 mg by mouth daily.    Marland Kitchen losartan (COZAAR) 100 MG tablet Take 100 mg by mouth daily.    . metoprolol succinate (TOPROL-XL) 25 MG 24 hr tablet Take 1 tablet (25 mg total) by mouth daily. 90 tablet 1  . oxyCODONE-acetaminophen (PERCOCET) 10-325 MG tablet Take 1 tablet by mouth every 6 (six) hours as needed for pain. 120 tablet 0  . palbociclib (IBRANCE) 125 MG capsule Take 1 capsule (125 mg total) by mouth daily with breakfast. Take whole with food. Take for 21 days on, 7 days off, repeat every 28 days. 21 capsule 6  . pantoprazole (PROTONIX) 40 MG tablet Take 1 tablet (40 mg total) by mouth daily. 90 tablet 1  . pioglitazone (ACTOS) 15 MG tablet Take 1 tablet (15 mg total) by mouth daily. 90 tablet 3  . pravastatin (PRAVACHOL) 40 MG tablet Take 40 mg by mouth daily.    . sitaGLIPtin-metformin (JANUMET) 50-1000 MG tablet Take 1 tablet by mouth daily.     No current facility-administered medications for this encounter.  REVIEW OF SYSTEMS:  A 10+ POINT REVIEW OF SYSTEMS WAS OBTAINED including neurology, dermatology, psychiatry, cardiac, respiratory, lymph, extremities, GI, GU, Musculoskeletal, constitutional, breasts, reproductive, HEENT.  All pertinent positives are noted in the HPI.  All others are negative.   PHYSICAL EXAM:  height is '5\' 4"'  (1.626 m) and weight is 225 lb 6 oz (102.2 kg). Her oral temperature is 98.4 F (36.9 C). Her blood pressure is 140/70 and her pulse is 76. Her respiration is 18 and oxygen saturation is 95%.   General: Alert and oriented, in no acute distress. HEENT: Head is normocephalic. Extraocular movements are intact. PERRL. Oropharynx is clear. Neck: Neck is supple, no palpable cervical or supraclavicular lymphadenopathy. Heart:  Regular in rate and rhythm with no murmurs, rubs, or gallops. Chest: Clear to auscultation bilaterally, with no rhonchi, wheezes, or rales. Abdomen: Soft, nontender, nondistended, with no rigidity or guarding. Extremities: patient has significant lymphedema in her right arm and hand area. She has good range of motion movement in her right arm and shoulder despite this issue. Lymphatics: see Neck Exam Skin: No concerning lesions. Musculoskeletal: symmetric strength and muscle tone throughout. Neurologic: Cranial nerves II through XII are grossly intact. No obvious focalities. Speech is fluent. Coordination is intact. Psychiatric: Judgment and insight are intact. Affect is appropriate. Patient reports some discomfort in the lower lumbar spine region with palpation. She also reports pain in the left the lower buttocks region. These areas are most bothersome to her at this time.  ECOG = 1  0 - Asymptomatic (Fully active, able to carry on all predisease activities without restriction)  1 - Symptomatic but completely ambulatory (Restricted in physically strenuous activity but ambulatory and able to carry out work of a light or sedentary nature. For example, light housework, office work)  2 - Symptomatic, <50% in bed during the day (Ambulatory and capable of all self care but unable to carry out any work activities. Up and about more than 50% of waking hours)  3 - Symptomatic, >50% in bed, but not bedbound (Capable of only limited self-care, confined to bed or chair 50% or more of waking hours)  4 - Bedbound (Completely disabled. Cannot carry on any self-care. Totally confined to bed or chair)  5 - Death   Eustace Pen MM, Creech RH, Tormey DC, et al. [phone]). "Toxicity and response criteria of the [facility]". Manuel Garcia Oncol. 5 (6): [phone]  LABORATORY DATA:  Lab Results  Component Value Date   WBC 3.4 (L) 09/11/2018   HGB 11.3 (L) 09/11/2018   HCT 34.5 (L) 09/11/2018    MCV 105.2 (H) 09/11/2018   PLT 212 09/11/2018   NEUTROABS 1.9 09/11/2018   Lab Results  Component Value Date   NA 141 09/11/2018   K 4.7 09/11/2018   CL 104 09/11/2018   CO2 27 09/11/2018   GLUCOSE 128 (H) 09/11/2018   CREATININE 0.98 09/11/2018   CALCIUM 9.4 09/11/2018      RADIOGRAPHY: Ct Chest W Contrast  Result Date: 09/06/2018 CLINICAL DATA:  Breast cancer for over 20 years. Stage IV for the past 11 years. On oral chemotherapy. History of IV chemotherapy and radiation therapy to chest/spine. EXAM: CT CHEST, ABDOMEN, AND PELVIS WITH CONTRAST TECHNIQUE: Multidetector CT imaging of the chest, abdomen and pelvis was performed following the standard protocol during bolus administration of intravenous contrast. CONTRAST:  123m OMNIPAQUE IOHEXOL 300 MG/ML SOLN, 336mOMNIPAQUE IOHEXOL 300 MG/ML SOLN COMPARISON:  spine MRI of 08/18/2018. FINDINGS: CT CHEST  FINDINGS Cardiovascular: Aortic atherosclerosis. Tortuous thoracic aorta. Normal heart size with mild lipomatous hypertrophy of the interatrial septum. No pericardial effusion. Multivessel coronary artery atherosclerosis. No central pulmonary embolism, on this non-dedicated study. Mediastinum/Nodes: No supraclavicular adenopathy. Right axillary node dissection. No axillary adenopathy. A left subpectoral node is prominent but not pathologic by size criteria at 8 mm on image 14/2. No mediastinal or hilar adenopathy.  No internal mammary adenopathy. Lungs/Pleura: Trace left pleural fluid or thickening. Left hemidiaphragm elevation is moderate. Anterior right upper lobe and right apical radiation induced fibrosis. Left lower lobe volume loss and atelectasis or scar. Subpleural pulmonary nodules of 2 mm in the left apex on image 12/6 and left upper lobe on image 23/6. A 5 mm left upper lobe nodule on image 40/6. Musculoskeletal: Right mastectomy Multifocal sclerotic osseous metastasis, as detailed on prior MRI. Examples in the T11 and T8 vertebral  bodies. CT ABDOMEN PELVIS FINDINGS Hepatobiliary: Mild hepatic steatosis with sparing adjacent the gallbladder. No focal liver lesion. Normal gallbladder, without biliary ductal dilatation. Pancreas: Mild pancreatic atrophy, without duct dilatation or acute inflammation. Spleen: Normal in size, without focal abnormality. Adrenals/Urinary Tract: Normal adrenal glands. Normal kidneys, without hydronephrosis. Normal urinary bladder. Stomach/Bowel: Normal stomach, without wall thickening. Scattered colonic diverticula. Normal terminal ileum and appendix. Normal small bowel. Vascular/Lymphatic: Aortic and branch vessel atherosclerosis. No abdominopelvic adenopathy. Reproductive: Hysterectomy.  No adnexal mass. Other: No significant free fluid.  Mild pelvic floor laxity. Mixed attenuation 1.1 cm nodule medial to the ascending colon on image 83/2. Musculoskeletal: Extensive osseous metastasis. Example lytic lesion within the posterior portion of the L5 vertebral body at 2.2 x 1.9 cm. Pathologic fracture at L4 is grossly similar to 08/18/2018 MRI. IMPRESSION: 1. Right mastectomy, with widespread osseous metastasis. 2. Isolated prominent but not pathologically enlarged left subpectoral lymph node, indeterminate. Recommend attention on follow-up. 3. Mixed attenuation nodule medial to the ascending colon is favored to represent a benign calcified lymph node or possibly contrast within a diverticulum. Isolated omental/peritoneal metastasis felt unlikely but cannot be excluded. 4. Nonspecific left-sided pulmonary nodules. 5. Age advanced coronary artery atherosclerosis. Recommend assessment of coronary risk factors and consideration of medical therapy. 6.  Aortic Atherosclerosis (ICD10-I70.0). 7. Left hemidiaphragm elevation with overlying volume loss and trace pleural fluid/thickening. 8. Mild hepatic steatosis. Electronically Signed   By: Abigail Miyamoto M.D.   On: 09/06/2018 12:52   Nm Bone Scan Whole Body  Result Date:  09/08/2018 CLINICAL DATA:  Breast cancer with known bone Mets. The patient is complaining of chronic low back and right hip pain. EXAM: NUCLEAR MEDICINE WHOLE BODY BONE SCAN TECHNIQUE: Whole body anterior and posterior images were obtained approximately 3 hours after intravenous injection of radiopharmaceutical. RADIOPHARMACEUTICALS:  22 mCi Technetium-58mMDP IV COMPARISON:  CT scan of the chest, abdomen, and pelvis of September 05, 2018 FINDINGS: There is adequate uptake of the radiopharmaceutical by the skeleton. There is adequate soft tissue clearance and renal activity. Within the calvarium there is a focus of increased uptake posteriorly to the left in the posterior parietal bone. Mildly increased uptake in the anterior temporal region on the right is suspected. There's increased uptake within the body of the sternum as well as in the mid and lower thoracic spine and lower lumbar spine. There is increased uptake within the anterior aspect of the left second rib and anterior aspect of the right seventh rib. There is focally increased uptake in the Peri glenoid region of the right scapula there is markedly increased uptake within  the lateral aspect of the right iliac bone extending into the ischium and in the left inferior ischium, periacetabular region, and inferior pubic ramus. Minimally increased uptake about the right knee and both ankles is likely degenerative. IMPRESSION: Widespread bony metastatic disease as described. Electronically Signed   By: David  Martinique M.D.   On: 09/08/2018 07:27   Ct Abdomen Pelvis W Contrast  Result Date: 09/06/2018 CLINICAL DATA:  Breast cancer for over 20 years. Stage IV for the past 11 years. On oral chemotherapy. History of IV chemotherapy and radiation therapy to chest/spine. EXAM: CT CHEST, ABDOMEN, AND PELVIS WITH CONTRAST TECHNIQUE: Multidetector CT imaging of the chest, abdomen and pelvis was performed following the standard protocol during bolus administration of  intravenous contrast. CONTRAST:  159m OMNIPAQUE IOHEXOL 300 MG/ML SOLN, 353mOMNIPAQUE IOHEXOL 300 MG/ML SOLN COMPARISON:  spine MRI of 08/18/2018. FINDINGS: CT CHEST FINDINGS Cardiovascular: Aortic atherosclerosis. Tortuous thoracic aorta. Normal heart size with mild lipomatous hypertrophy of the interatrial septum. No pericardial effusion. Multivessel coronary artery atherosclerosis. No central pulmonary embolism, on this non-dedicated study. Mediastinum/Nodes: No supraclavicular adenopathy. Right axillary node dissection. No axillary adenopathy. A left subpectoral node is prominent but not pathologic by size criteria at 8 mm on image 14/2. No mediastinal or hilar adenopathy.  No internal mammary adenopathy. Lungs/Pleura: Trace left pleural fluid or thickening. Left hemidiaphragm elevation is moderate. Anterior right upper lobe and right apical radiation induced fibrosis. Left lower lobe volume loss and atelectasis or scar. Subpleural pulmonary nodules of 2 mm in the left apex on image 12/6 and left upper lobe on image 23/6. A 5 mm left upper lobe nodule on image 40/6. Musculoskeletal: Right mastectomy Multifocal sclerotic osseous metastasis, as detailed on prior MRI. Examples in the T11 and T8 vertebral bodies. CT ABDOMEN PELVIS FINDINGS Hepatobiliary: Mild hepatic steatosis with sparing adjacent the gallbladder. No focal liver lesion. Normal gallbladder, without biliary ductal dilatation. Pancreas: Mild pancreatic atrophy, without duct dilatation or acute inflammation. Spleen: Normal in size, without focal abnormality. Adrenals/Urinary Tract: Normal adrenal glands. Normal kidneys, without hydronephrosis. Normal urinary bladder. Stomach/Bowel: Normal stomach, without wall thickening. Scattered colonic diverticula. Normal terminal ileum and appendix. Normal small bowel. Vascular/Lymphatic: Aortic and branch vessel atherosclerosis. No abdominopelvic adenopathy. Reproductive: Hysterectomy.  No adnexal mass. Other:  No significant free fluid.  Mild pelvic floor laxity. Mixed attenuation 1.1 cm nodule medial to the ascending colon on image 83/2. Musculoskeletal: Extensive osseous metastasis. Example lytic lesion within the posterior portion of the L5 vertebral body at 2.2 x 1.9 cm. Pathologic fracture at L4 is grossly similar to 08/18/2018 MRI. IMPRESSION: 1. Right mastectomy, with widespread osseous metastasis. 2. Isolated prominent but not pathologically enlarged left subpectoral lymph node, indeterminate. Recommend attention on follow-up. 3. Mixed attenuation nodule medial to the ascending colon is favored to represent a benign calcified lymph node or possibly contrast within a diverticulum. Isolated omental/peritoneal metastasis felt unlikely but cannot be excluded. 4. Nonspecific left-sided pulmonary nodules. 5. Age advanced coronary artery atherosclerosis. Recommend assessment of coronary risk factors and consideration of medical therapy. 6.  Aortic Atherosclerosis (ICD10-I70.0). 7. Left hemidiaphragm elevation with overlying volume loss and trace pleural fluid/thickening. 8. Mild hepatic steatosis. Electronically Signed   By: KyAbigail Miyamoto.D.   On: 09/06/2018 12:52      IMPRESSION: Estrogen receptor positive stage IV breast cancer. Recent bone scan shows progression in the lower lumbar spine left and right pelvis area. She is not very symptomatic with her disease in the right pelvis region at this  time.  The patient is a good candidate for radiotherapy to the lumbar spine metastases the left medial pelvis metastasis. We have requested records from her previous radiation treatment facility to ensure that upcoming radiation fields have not been previously treated.  Today, I talked to the patient about the findings and work-up thus far.  We discussed the natural history of metastatic breast cancer and general treatment, highlighting the role of radiotherapy in the management.  We discussed the available radiation  techniques, and focused on the details of logistics and delivery.  We reviewed the anticipated acute and late sequelae associated with radiation in this setting.  The patient was encouraged to ask questions that I answered to the best of my ability.  A patient consent form was discussed and signed.  We retained a copy for our records.  The patient would like to proceed with radiation and will be scheduled for CT simulation.  PLAN:The patient will be in [place] for a few weeks where her husband will have a radical prostatectomy and prefers to undergo CT simulation and treatment when she returns in early February. Fortunately her pain does not appear to be too significant at this time.She will be called by our department to schedule this.      ------------------------------------------------  Blair Promise, PhD, MD  This document serves as a record of services personally performed by [name], MD. It was created on his behalf by Rae Lips, a trained medical scribe. The creation of this record is based on the scribe's personal observations and the provider's statements to them. This document has been checked and approved by the attending provider.

## 2018-10-07 NOTE — Progress Notes (Signed)
[place]  [phone] [phone]     ID: Mia [name] DOB: 1956/01/06  MR#: [id number]  JJK#:[id number]  Patient Care Team: Lucille Passy, MD as PCP - General (Family Medicine) Jazelle Achey, Virgie Dad, MD as Consulting Physician (Oncology) [name], [name] PA  CHIEF COMPLAINT: Estrogen receptor positive stage IV breast cancer  CURRENT TREATMENT: Letrozole, palbociclib   INTERVAL HISTORY: Mia [name] returns today for follow-up and treatment of her etrogen receptor positive stage IV breast cancer.  The patient continues on letrozole. She occasionally experiences some hot flashes.  The patient also continues on palbociclib. She experiences some nausea.  There has been no vomiting.  Her most recent staging studies were stable, December 2019.  Results for Mia [name] (MRN [id number]) as of 63/04/2019 17:29  Ref. Range 05/22/2018 11:19 06/19/2018 11:11 07/17/2018 11:02 08/14/2018 10:52 09/11/2018 11:06  CA 27.29 Latest Ref Range: 0.0 - 38.6 U/mL 109.1 (H) 87.6 (H) 96.2 (H) 90.6 (H) 102.3 (H)    REVIEW OF SYSTEMS: Mia [name] had family over at her house and then she went to go see her 45 year old grand daughter at [facility]. For exercise, she tries to walk a few times a week. She also does the grocery shopping and general chores around the home. She has some pain in her hip and pelvic groin area, but only while walking; she has no pain when she is sitting or laying. She takes some Advil or Tylenol with some relief. She is planning on having radiation on 11/11/2018 to help treat that area. The patient denies unusual headaches, visual changes, vomiting, or dizziness. There has been no unusual cough, phlegm production, or pleurisy. This been no change in bowel or bladder habits and she specifically denies constipation. The patient denies unexplained fatigue or unexplained weight loss, bleeding, rash, or fever. A detailed review of  systems was otherwise noncontributory.    HISTORY OF CURRENT ILLNESS: From the original intake note:  We have reviewed the medical records from [place], which is the source of the information below:  Mia [name] was initially diagnosed with Stage IIIA (T2N2M0) invasive ductal carcinoma, estrogen receptor positive and HER2 negative right breast cancer in 2000. She underwent right mastectomy, with 4 positive metastatic lymph nodes. She had chemotherapy with taxol and cytoxan and fulvestrant in the past. She had radiation and completed 5 years of tamoxifen.   She was diagnosed with Stage IV disease 04/18/2005 with metastases to the bone, lung, and additional nodes. She was on capecitabine but was discontinued after rising CA 27-29 and scans.   She started anastrozole and everolimus with Delton See in April 2014. She tolerated this well, but this was discontinued in July 2014 due to abnormal blood tests, hyperlipidemia and hyperglycemia.  She began Abraxane 154m /m2 and Xgeva in August 2014 weekly x3 with 1 week off. She tolerated this treatment well. She discontinued Xgeva January 2016 due to osteonecrosis of the jaw and mouth issues. She continued abraxane alone until her last dose on 11/18/2015 due to neuropathy and disease progression with restaging studies on 11/23/2015 with a CT chest abdomen and pelvis and one scan showing skull, sternal and femoral lesions.   She was switched to gemcitabine starting 01/20/2016 weekly x3 and 1 week off. This was discontinued on 06/15/2016 due to a port related jugular clot on the right side. She was given Lovenox for 3 months.  She was switched to Letrozole 2.5 mg and palbociclib 125 mg starting 07/13/2016. She  tolerated this well. Restaging studies with CT chest abdomen and pelvis and bone scan on 07/01/2017 showed stable/ improving lesions. CA 27-29 was stable.  During the last few months of follow up in [place], she completed restaging studies with  a CT chest abdomen and pelvis at [facility] on 12/17/2017 showing: A 6 mm pulmonary nodule in the left upper lobe laterally. Progression of metastatic disease to the bones at the L4 and L5 vertebral bodies. Sclerotic metastases at T8, T10, an T11, are similar to previous exams. Sclerotic metastases in the sternum stable. Hepatic steatosis. Aortic atherosclerosis.  Most recent CA-76-29 (January 2019) was 58.  Most recent hemoglobin A1c was 7.1 according to the patient  The patient's subsequent history is as detailed below.  PAST MEDICAL HISTORY: Past Medical History:  Diagnosis Date  . Breast cancer metastasized to bone ([place])   . Hyperlipidemia   . Hypertension   . Lymphedema of right arm   . Neuropathy associated with cancer ([place])   . Obesity (BMI 35.0-39.9 without comorbidity)      PAST SURGICAL HISTORY: Past Surgical History:  Procedure Laterality Date  . CATARACT EXTRACTION Left   . HYSTERECTOMY ABDOMINAL WITH SALPINGO-OOPHORECTOMY    . MODIFIED RADICAL MASTECTOMY Right       FAMILY HISTORY No family history on file.  The patient reports she had negative genetic testing at [facility] 2014,report not available. --The patient's father died at age 15 due to a PE, and he also had a history of colon cancer diagnosed at age 13. The patient's mother died at age 24 due to diabetes and survived a cerebral aneurysm.The patient's mother also had a history of breast cancer diagnosed at age 6.  The patient has no brothers and 1 sister. The patient's sister was diagnosed with non invasive breast cancer at age 69. There was a paternal grandmother diagnosed with breast cancer at age 60. The patient denies a family history of ovarian cancer.    GYNECOLOGIC HISTORY:  Patient's last menstrual period was 10/01/2006. Menarche: 63 years old Age at first live birth: 63 years old She is Mia [name].  She is status post hysterectomy with bilateral salpingo- oophorectomy in 2009 at about age  55.  She never used HRT.    SOCIAL HISTORY:  Mia [name] is disabled due to her breast cancer. She used to be Surveyor, quantity for a cardiology office. Her husband, [name] is in the process of retiring. He is a Electrical engineer for a power company. The patient's daughter, [name], lives in [place] and works as a Teaching laboratory technician. The patient's second daughter, [name] is a stay at home mom and is a special needs teacher, who will soon move to [facility] The patient plans on attending [employer].      ADVANCED DIRECTIVES:    HEALTH MAINTENANCE: Social History   Tobacco Use  . Smoking status: Never Smoker  . Smokeless tobacco: Never Used  Substance Use Topics  . Alcohol use: Not Currently  . Drug use: Never     Colonoscopy: 2017   PAP:  Bone density: never  Mammogram: 2017   Allergies  Allergen Reactions  . Morphine And Related Anaphylaxis    Current Outpatient Medications  Medication Sig Dispense Refill  . amlodipine-atorvastatin (CADUET) 10-10 MG tablet Take 1 tablet by mouth daily.    Marland Kitchen aspirin 81 MG chewable tablet Chew by mouth daily.    . fenofibrate (TRICOR) 145 MG tablet Take 145 mg by mouth  daily.    . gabapentin (NEURONTIN) 300 MG capsule Take 300 mg by mouth 2 (two) times daily. Pt is only taking 2 capsules a day    . Insulin Admin Supplies MISC Inject 24 Units into the skin daily.    . Insulin Pen Needle 31G X 5 MM MISC UAD for Sq inj for DM qd 100 each PRN  . letrozole (FEMARA) 2.5 MG tablet Take 2.5 mg by mouth daily.    Marland Kitchen loratadine (CLARITIN) 10 MG tablet Take 10 mg by mouth daily.    Marland Kitchen losartan (COZAAR) 100 MG tablet Take 100 mg by mouth daily.    . metoprolol succinate (TOPROL-XL) 25 MG 24 hr tablet Take 1 tablet (25 mg total) by mouth daily. 90 tablet 1  . oxyCODONE-acetaminophen (PERCOCET) 10-325 MG tablet Take 1 tablet by mouth every 6 (six) hours as needed for pain. 120 tablet 0  . palbociclib (IBRANCE) 125 MG  capsule Take 1 capsule (125 mg total) by mouth daily with breakfast. Take whole with food. Take for 21 days on, 7 days off, repeat every 28 days. 21 capsule 6  . pantoprazole (PROTONIX) 40 MG tablet Take 1 tablet (40 mg total) by mouth daily. 90 tablet 1  . pioglitazone (ACTOS) 15 MG tablet Take 1 tablet (15 mg total) by mouth daily. 90 tablet 3  . pravastatin (PRAVACHOL) 40 MG tablet Take 40 mg by mouth daily.    . sitaGLIPtin-metformin (JANUMET) 50-1000 MG tablet Take 1 tablet by mouth daily.     No current facility-administered medications for this visit.     OBJECTIVE: Middle-aged white woman who climbs on the examination table with somedifficulty Vitals:   10/09/18 1117  BP: 131/87  Pulse: 79  Resp: 18  Temp: 99 F (37.2 C)  SpO2: 98%     Body mass index is 38.35 kg/m.   Wt Readings from Last 3 Encounters:  10/09/18 223 lb 6.4 oz (101.3 kg)  10/02/18 225 lb 6 oz (102.2 kg)  09/11/18 224 lb 1.6 oz (101.7 kg)      ECOG FS:1 - Symptomatic but completely ambulatory  Sclerae unicteric, pupils round and equal Oropharynx clear and moist No cervical or supraclavicular adenopathy Lungs no rales or rhonchi Heart regular rate and rhythm Abd soft, nontender, positive bowel sounds MSK no focal spinal tenderness, no upper extremity lymphedema Neuro: nonfocal, well oriented, appropriate affect Breasts: The left breast is unremarkable.  The right breast is status post mastectomy.  There is no evidence of chest wall recurrence.  Both axillae are benign.     LAB RESULTS:  CMP     Component Value Date/Time   NA 141 09/11/2018 1106   NA 140 01/31/2018   K 4.7 09/11/2018 1106   CL 104 09/11/2018 1106   CO2 27 09/11/2018 1106   GLUCOSE 128 (H) 09/11/2018 1106   BUN 11 09/11/2018 1106   BUN 22 (A) 01/31/2018   CREATININE 0.98 09/11/2018 1106   CALCIUM 9.4 09/11/2018 1106   PROT 7.0 09/11/2018 1106   ALBUMIN 3.6 09/11/2018 1106   AST 16 09/11/2018 1106   ALT 13 09/11/2018 1106    ALKPHOS 83 09/11/2018 1106   BILITOT 0.4 09/11/2018 1106   GFRNONAA >60 09/11/2018 1106   GFRAA >60 09/11/2018 1106    No results found for: TOTALPROTELP, ALBUMINELP, A1GS, A2GS, BETS, BETA2SER, GAMS, MSPIKE, SPEI  No results found for: KPAFRELGTCHN, LAMBDASER, KAPLAMBRATIO  Lab Results  Component Value Date   WBC 2.7 (L) 10/09/2018  NEUTROABS 1.6 (L) 10/09/2018   HGB 11.5 (L) 10/09/2018   HCT 34.8 (L) 10/09/2018   MCV 104.2 (H) 10/09/2018   PLT 207 10/09/2018    _0 @  No results found for: LABCA2  No components found for: [id number]  No results for input(s): INR in the last 168 hours.  No results found for: LABCA2  No results found for: CAN199  No results found for: DGU440  No results found for: HKV425  Lab Results  Component Value Date   CA2729 102.3 (H) 09/11/2018    No components found for: HGQUANT  No results found for: CEA1 / No results found for: CEA1   No results found for: AFPTUMOR  No results found for: CHROMOGRNA  No results found for: PSA1  Appointment on 10/09/2018  Component Date Value Ref Range Status  . WBC 10/09/2018 2.7* 4.0 - 10.5 K/uL Final  . RBC 10/09/2018 3.34* 3.87 - 5.11 MIL/uL Final  . Hemoglobin 10/09/2018 11.5* 12.0 - 15.0 g/dL Final  . HCT 10/09/2018 34.8* 36.0 - 46.0 % Final  . MCV 10/09/2018 104.2* 80.0 - 100.0 fL Final  . MCH 10/09/2018 34.4* 26.0 - 34.0 pg Final  . MCHC 10/09/2018 33.0  30.0 - 36.0 g/dL Final  . RDW 10/09/2018 13.8  11.5 - 15.5 % Final  . Platelets 10/09/2018 207  150 - 400 K/uL Final  . nRBC 10/09/2018 0.0  0.0 - 0.2 % Final  . Neutrophils Relative % 10/09/2018 56  % Final  . Neutro Abs 10/09/2018 1.6* 1.7 - 7.7 K/uL Final  . Lymphocytes Relative 10/09/2018 25  % Final  . Lymphs Abs 10/09/2018 0.7  0.7 - 4.0 K/uL Final  . Monocytes Relative 10/09/2018 15  % Final  . Monocytes Absolute 10/09/2018 0.4  0.1 - 1.0 K/uL Final  . Eosinophils Relative 10/09/2018 1  % Final  . Eosinophils  Absolute 10/09/2018 0.0  0.0 - 0.5 K/uL Final  . Basophils Relative 10/09/2018 2  % Final  . Basophils Absolute 10/09/2018 0.0  0.0 - 0.1 K/uL Final  . Immature Granulocytes 10/09/2018 1  % Final  . Abs Immature Granulocytes 10/09/2018 0.02  0.00 - 0.07 K/uL Final   Performed at [facility], [place] [address], [place], [place] [address]    (this displays the last labs from the last 3 days)  No results found for: TOTALPROTELP, ALBUMINELP, A1GS, A2GS, BETS, BETA2SER, GAMS, MSPIKE, SPEI (this displays SPEP labs)  No results found for: KPAFRELGTCHN, LAMBDASER, KAPLAMBRATIO (kappa/lambda light chains)  No results found for: HGBA, HGBA2QUANT, HGBFQUANT, HGBSQUAN (Hemoglobinopathy evaluation)   No results found for: LDH  No results found for: IRON, TIBC, IRONPCTSAT (Iron and TIBC)  No results found for: FERRITIN  Urinalysis No results found for: COLORURINE, APPEARANCEUR, LABSPEC, PHURINE, GLUCOSEU, HGBUR, BILIRUBINUR, KETONESUR, PROTEINUR, UROBILINOGEN, NITRITE, LEUKOCYTESUR   STUDIES: December studies again reviewed with the patient  ELIGIBLE FOR AVAILABLE RESEARCH PROTOCOL: no  ASSESSMENT: 63 y.o. [place], [place] woman with stage IV breast cancer, as follows:  (1) status post right mastectomy in 2000, for a pT2 pN2, stage IIIA invasive ductal carcinoma, estrogen receptor positive, progesterone receptor not tested, HER-2 negative (0) by immunohistochemistry  (a) adjuvant chemotherapy with doxorubicin and cyclophosphamide in dose dense fashion x4 followed by paclitaxel in dose dense fashion x4  (b) adjuvant radiation: 30 doses  (c) antiestrogens: Tamoxifen for 5 years, completed 2005  (2) METASTASTIC DISEASE: 2008, involving bones, lungs, and lymph nodes  (a) CA 27-29 is informative  (b) CT  of the chest abdomen and pelvis in [facility] finds stable sclerotic metastases (T8, T10, T11, sternum, L4, L5); 0.6 cm left upper lobe lung nodule  stable  (3)  prior anti-estrogen treatments:  (a) fulvestrant--progression  (b) exemestane/everolimus--hyperlipidemia, hyperglycemia  (4) prior chemotherapy treatments:  (a) capecitabine: progression  (b) Abraxane, August 2014 through 11/18/2015: good response but stopped due to neuropathy  (c) gemcitabine 01/20/2016--with multiple interruptions secondary to infections  (5) radiation therapy:  (a) T spine and Right femur, completed 01/10/2016  (6) letrozole/ palbociclib started 07/13/2016  (7) bone treatment:  (a) denosumab/Xgeva--discontinued January 2016 due to osteonecrosis of the jaw  (8) cancer associated pain:  (a) currently on oxycodone/APAP 10/325 QID as needed  (b) PMP Aware accessed 10/09/2018  (9) restaging studies obtained November/December 2019 show  (a) mostly stable bone lesions with evidence of progression at L3-L5; no epidural tumor by total spinal MRI in November 2019  (b) no evidence of progressive lung, lymph node or liver lesions by CT scans of the chest abdomen and pelvis and bone scan December 2019   PLAN:  Lalia is now 11 years out from definitive diagnosis of metastatic breast cancer.  Her disease is moderately well controlled on her current medications.  The fly in the ointment is the worsening pain in the left hip area.  She is scheduled to see Dr. Randa Ngo in February and hopefully some radiation to that area will provide her some relief.  Her pain medication is renewed monthly and she picks up the prescription written for her in December today.  Review of PMP aware shows no other prescriptions except the ones that we are providing her here.  She is going to be in [place] with her husband for his surgery.  If her return is delayed and she needs a refill she will let us know and we will arrange for that  Otherwise she will return to see me early February for lab work and physical exam.  Before the March visit we will consider restaging  studies  She knows to call for any other problems that may develop before then.   Mia [name], Virgie Dad, MD  10/09/18 11:37 AM Medical Oncology and Hematology [facility] [address] [place], [place] [address] Tel. [phone]    Fax. [phone]  I, Jacqualyn Posey am acting as a Education administrator for Chauncey Cruel, MD.   I, Lurline Del MD, have reviewed the above documentation for accuracy and completeness, and I agree with the above.

## 2018-10-08 ENCOUNTER — Other Ambulatory Visit: Payer: Self-pay

## 2018-10-08 ENCOUNTER — Encounter: Payer: Self-pay | Admitting: Family Medicine

## 2018-10-08 DIAGNOSIS — Z760 Encounter for issue of repeat prescription: Secondary | ICD-10-CM

## 2018-10-09 ENCOUNTER — Inpatient Hospital Stay (HOSPITAL_BASED_OUTPATIENT_CLINIC_OR_DEPARTMENT_OTHER): Payer: MEDICARE | Admitting: Oncology

## 2018-10-09 ENCOUNTER — Inpatient Hospital Stay: Payer: MEDICARE | Attending: Oncology

## 2018-10-09 VITALS — BP 131/87 | HR 79 | Temp 99.0°F | Resp 18 | Ht 64.0 in | Wt 223.4 lb

## 2018-10-09 DIAGNOSIS — Z90710 Acquired absence of both cervix and uterus: Secondary | ICD-10-CM | POA: Insufficient documentation

## 2018-10-09 DIAGNOSIS — C7800 Secondary malignant neoplasm of unspecified lung: Secondary | ICD-10-CM

## 2018-10-09 DIAGNOSIS — G893 Neoplasm related pain (acute) (chronic): Secondary | ICD-10-CM | POA: Insufficient documentation

## 2018-10-09 DIAGNOSIS — G620 Drug-induced polyneuropathy: Secondary | ICD-10-CM

## 2018-10-09 DIAGNOSIS — Z794 Long term (current) use of insulin: Secondary | ICD-10-CM | POA: Insufficient documentation

## 2018-10-09 DIAGNOSIS — Z79899 Other long term (current) drug therapy: Secondary | ICD-10-CM

## 2018-10-09 DIAGNOSIS — Z7982 Long term (current) use of aspirin: Secondary | ICD-10-CM

## 2018-10-09 DIAGNOSIS — Z90722 Acquired absence of ovaries, bilateral: Secondary | ICD-10-CM | POA: Insufficient documentation

## 2018-10-09 DIAGNOSIS — E785 Hyperlipidemia, unspecified: Secondary | ICD-10-CM | POA: Insufficient documentation

## 2018-10-09 DIAGNOSIS — I10 Essential (primary) hypertension: Secondary | ICD-10-CM

## 2018-10-09 DIAGNOSIS — I700 Atherosclerosis of aorta: Secondary | ICD-10-CM | POA: Diagnosis not present

## 2018-10-09 DIAGNOSIS — C50919 Malignant neoplasm of unspecified site of unspecified female breast: Secondary | ICD-10-CM

## 2018-10-09 DIAGNOSIS — E669 Obesity, unspecified: Secondary | ICD-10-CM

## 2018-10-09 DIAGNOSIS — G629 Polyneuropathy, unspecified: Secondary | ICD-10-CM | POA: Insufficient documentation

## 2018-10-09 DIAGNOSIS — M879 Osteonecrosis, unspecified: Secondary | ICD-10-CM

## 2018-10-09 DIAGNOSIS — Z170 Estrogen receptor positive status [ER+]: Secondary | ICD-10-CM | POA: Insufficient documentation

## 2018-10-09 DIAGNOSIS — R110 Nausea: Secondary | ICD-10-CM

## 2018-10-09 DIAGNOSIS — R739 Hyperglycemia, unspecified: Secondary | ICD-10-CM

## 2018-10-09 DIAGNOSIS — Z79811 Long term (current) use of aromatase inhibitors: Secondary | ICD-10-CM | POA: Diagnosis not present

## 2018-10-09 DIAGNOSIS — C50911 Malignant neoplasm of unspecified site of right female breast: Secondary | ICD-10-CM | POA: Insufficient documentation

## 2018-10-09 DIAGNOSIS — C7951 Secondary malignant neoplasm of bone: Secondary | ICD-10-CM | POA: Diagnosis not present

## 2018-10-09 DIAGNOSIS — T451X5A Adverse effect of antineoplastic and immunosuppressive drugs, initial encounter: Secondary | ICD-10-CM

## 2018-10-09 LAB — COMPREHENSIVE METABOLIC PANEL
ALK PHOS: 87 U/L (ref 38–126)
ALT: 14 U/L (ref 0–44)
AST: 15 U/L (ref 15–41)
Albumin: 3.5 g/dL (ref 3.5–5.0)
Anion gap: 10 (ref 5–15)
BILIRUBIN TOTAL: 0.3 mg/dL (ref 0.3–1.2)
BUN: 17 mg/dL (ref 8–23)
CALCIUM: 9.4 mg/dL (ref 8.9–10.3)
CO2: 26 mmol/L (ref 22–32)
CREATININE: 0.9 mg/dL (ref 0.44–1.00)
Chloride: 104 mmol/L (ref 98–111)
GFR calc Af Amer: >60 mL/min (ref >60)
Glucose, Bld: 163 mg/dL — ABNORMAL HIGH (ref 70–99)
Potassium: 4.8 mmol/L (ref 3.5–5.1)
Sodium: 140 mmol/L (ref 135–145)
TOTAL PROTEIN: 7.1 g/dL (ref 6.5–8.1)

## 2018-10-09 LAB — CBC WITH DIFFERENTIAL/PLATELET
Abs Immature Granulocytes: 0.02 10*3/uL (ref 0.00–0.07)
BASOS PCT: 2 %
Basophils Absolute: 0 10*3/uL (ref 0.0–0.1)
EOS PCT: 1 %
Eosinophils Absolute: 0 10*3/uL (ref 0.0–0.5)
HCT: 34.8 % — ABNORMAL LOW (ref 36.0–46.0)
Hemoglobin: 11.5 g/dL — ABNORMAL LOW (ref 12.0–15.0)
Immature Granulocytes: 1 %
Lymphocytes Relative: 25 %
Lymphs Abs: 0.7 10*3/uL (ref 0.7–4.0)
MCH: 34.4 pg — AB (ref 26.0–34.0)
MCHC: 33 g/dL (ref 30.0–36.0)
MCV: 104.2 fL — AB (ref 80.0–100.0)
MONO ABS: 0.4 10*3/uL (ref 0.1–1.0)
Monocytes Relative: 15 %
Neutro Abs: 1.6 10*3/uL — ABNORMAL LOW (ref 1.7–7.7)
Neutrophils Relative %: 56 %
Platelets: 207 10*3/uL (ref 150–400)
RBC: 3.34 MIL/uL — AB (ref 3.87–5.11)
RDW: 13.8 % (ref 11.5–15.5)
WBC: 2.7 10*3/uL — AB (ref 4.0–10.5)
nRBC: 0 % (ref 0.0–0.2)

## 2018-10-09 MED ORDER — FLUTICASONE PROPIONATE 50 MCG/ACT NA SUSP: 2.0000 | Freq: Every day | NASAL | 3 refills | Status: DC

## 2018-10-10 ENCOUNTER — Telehealth: Payer: Self-pay | Admitting: Oncology

## 2018-10-10 LAB — CANCER ANTIGEN 27.29: CA 27.29: 127.7 U/mL — ABNORMAL HIGH (ref 0.0–38.6)

## 2018-10-10 NOTE — Telephone Encounter (Signed)
Per 1/9 no los °

## 2018-10-14 ENCOUNTER — Other Ambulatory Visit: Payer: Self-pay | Admitting: Oncology

## 2018-10-14 DIAGNOSIS — C50919 Malignant neoplasm of unspecified site of unspecified female breast: Secondary | ICD-10-CM

## 2018-10-17 ENCOUNTER — Other Ambulatory Visit: Payer: Self-pay | Admitting: Oncology

## 2018-10-17 DIAGNOSIS — C50919 Malignant neoplasm of unspecified site of unspecified female breast: Secondary | ICD-10-CM

## 2018-11-04 ENCOUNTER — Encounter: Payer: Self-pay | Admitting: Family Medicine

## 2018-11-04 MED ORDER — PIOGLITAZONE HCL 15 MG PO TABS: 15.0000 mg | ORAL_TABLET | Freq: Every day | ORAL | 1 refills | Status: DC

## 2018-11-05 NOTE — Progress Notes (Signed)
Mia [name]  [phone] [phone]) [phone]    ID: Mia [name] DOB: 1956-05-06  MR#: [id number]  BDZ#:[id number]  Patient Care Team: Mia Passy, MD as PCP - General (Family Medicine) , Mia Dad, MD as Consulting Physician (Oncology) OTHER MD: Mia Austria MD, Mia Sabal PA   CHIEF COMPLAINT: Estrogen receptor positive stage IV breast cancer  CURRENT TREATMENT: Letrozole, palbociclib   INTERVAL HISTORY: Mia [name] returns today for follow-up and treatment of her etrogen receptor positive stage IV breast cancer.  She continues on letrozole. She has some occasional diaphoresis. There is no vaginal dryness.  She also continues on palbociclib. She has some rare nausea.    Results for Mia, [name] (MRN [id number]) as of 11/05/2018 15:44  Ref. Range 06/19/2018 11:11 07/17/2018 11:02 08/14/2018 10:52 09/11/2018 11:06 10/09/2018 11:07  CA 27.29 Latest Ref Range: 0.0 - 38.6 U/mL 87.6 (H) 96.2 (H) 90.6 (H) 102.3 (H) 127.7 (H)   Her CT of the abdomen and pelvis and chest as well as her bone scan early December 2019 showed of course multiple bone lesions but overall stable disease.  She saw Dr. Gery [name] 10/02/2018 and he reviewed her prior radiation to the right chest wall, thoracic spine, and right femur.  He felt she was a good candidate for radiotherapy to the lumbar spine metastases and the left medial pelvis.  Treatment was postponed because of the patient's trip to [place] where her husband had a radical prostatectomy.  She is scheduled for CT simulation 11/11/2018.   REVIEW OF SYSTEMS: Mia [name] is glad to be back home following her husband's surgery in [place]. She has noticed that her pain has increased over the last month; it originally did not hurt to sit, but now she has pain throughout the day. She assesses her day to determine how much pain relief medication to take. The patient denies unusual headaches, visual changes,  vomiting, or dizziness. There has been no unusual cough, phlegm production, or pleurisy. This been no change in bowel or bladder habits. The patient denies unexplained fatigue or unexplained weight loss, bleeding, rash, or fever. A detailed review of systems was otherwise noncontributory.    HISTORY OF CURRENT ILLNESS: From the original intake note:  We have reviewed the medical records from Mia [name], which is the source of the information below:  Mia [name] was initially diagnosed with Stage IIIA (T2N2M0) invasive ductal carcinoma, estrogen receptor positive and HER2 negative right breast cancer in 2000. She underwent right mastectomy, with 4 positive metastatic lymph nodes. She had chemotherapy with taxol and cytoxan and fulvestrant in the past. She had radiation and completed 5 years of tamoxifen.   She was diagnosed with Stage IV disease 04/18/2005 with metastases to the bone, lung, and additional nodes. She was on capecitabine but was discontinued after rising CA 27-29 and scans.   She started anastrozole and everolimus with Delton See in April 2014. She tolerated this well, but this was discontinued in July 2014 due to abnormal blood tests, hyperlipidemia and hyperglycemia.  She began Abraxane 138m /m2 and Xgeva in August 2014 weekly x3 with 1 week off. She tolerated this treatment well. She discontinued Xgeva January 2016 due to osteonecrosis of the jaw and mouth issues. She continued abraxane alone until her last dose on 11/18/2015 due to neuropathy and disease progression with restaging studies on 11/23/2015 with a CT chest abdomen and pelvis and one scan showing skull, sternal and femoral lesions.   She was switched to gemcitabine starting 01/20/2016 weekly x3  and 1 week off. This was discontinued on 06/15/2016 due to a port related jugular clot on the right side. She was given Lovenox for 3 months.  She was switched to Letrozole 2.5 mg and palbociclib 125 mg starting  07/13/2016. She tolerated this well. Restaging studies with CT chest abdomen and pelvis and bone scan on 07/01/2017 showed stable/ improving lesions. CA 27-29 was stable.  During the last few months of follow up in [place], she completed restaging studies with a CT chest abdomen and pelvis at [facility] on 12/17/2017 showing: A 6 mm pulmonary nodule in the left upper lobe laterally. Progression of metastatic disease to the bones at the L4 and L5 vertebral bodies. Sclerotic metastases at T8, T10, an T11, are similar to previous exams. Sclerotic metastases in the sternum stable. Hepatic steatosis. Aortic atherosclerosis.  Most recent CA-1-29 (January 2019) was 58.  Most recent hemoglobin A1c was 7.1 according to the patient  The patient's subsequent history is as detailed below.  PAST MEDICAL HISTORY: Past Medical History:  Diagnosis Date  . Breast cancer metastasized to bone ([place])   . Hyperlipidemia   . Hypertension   . Lymphedema of right arm   . Neuropathy associated with cancer ([place])   . Obesity (BMI 35.0-39.9 without comorbidity)      PAST SURGICAL HISTORY: Past Surgical History:  Procedure Laterality Date  . CATARACT EXTRACTION Left   . HYSTERECTOMY ABDOMINAL WITH SALPINGO-OOPHORECTOMY    . MODIFIED RADICAL MASTECTOMY Right       FAMILY HISTORY No family history on file.  The patient reports she had negative genetic testing at [facility] 2014,report not available. --The patient's father died at age 40 due to a PE, and he also had a history of colon cancer diagnosed at age 70. The patient's mother died at age 32 due to diabetes and survived a cerebral aneurysm.The patient's mother also had a history of breast cancer diagnosed at age 38.  The patient has no brothers and 1 sister. The patient's sister was diagnosed with non invasive breast cancer at age 41. There was a paternal grandmother diagnosed with breast cancer at age 12. The patient denies a family history of ovarian  cancer.    GYNECOLOGIC HISTORY:  Patient's last menstrual period was 10/01/2006. Menarche: 63 years old Age at first live birth: 63 years old She is Mia [name] Mia [name].  She is status post hysterectomy with bilateral salpingo- oophorectomy in 2009 at about age 46.  She never used HRT.    SOCIAL HISTORY:  Mia [name] is disabled due to her breast cancer. She used to be Surveyor, quantity for a cardiology office. Her husband, [name] is in the process of retiring. He is a Electrical engineer for a power company. The patient's daughter, [name], lives in [place] and works as a Teaching laboratory technician. The patient's second daughter, [name] is a stay at home mom and is a special needs teacher, who will soon move to [facility]). The patient plans on attending [employer].     ADVANCED DIRECTIVES:    HEALTH MAINTENANCE: Social History   Tobacco Use  . Smoking status: Never Smoker  . Smokeless tobacco: Never Used  Substance Use Topics  . Alcohol use: Not Currently  . Drug use: Never    Colonoscopy: 2017   PAP:  Bone density: never  Mammogram: 2017   Allergies  Allergen Reactions  . Morphine And Related Anaphylaxis    Current Outpatient Medications  Medication Sig Dispense Refill  .  amlodipine-atorvastatin (CADUET) 10-10 MG tablet Take 1 tablet by mouth daily.    Marland Kitchen aspirin 81 MG chewable tablet Chew by mouth daily.    . fenofibrate (TRICOR) 145 MG tablet Take 145 mg by mouth daily.    . fluticasone (FLONASE) 50 MCG/ACT nasal spray Place 2 sprays into both nostrils daily. 16 g 3  . gabapentin (NEURONTIN) 300 MG capsule Take 300 mg by mouth 2 (two) times daily. Pt is only taking 2 capsules a day    . IBRANCE 125 MG capsule TAKE 1 CAPSULE DAILY WITH BREAKFAST. TAKE WHOLE WITH FOOD FOR 21 DAYS ON AND 7 DAYS OFF EVERY 28 DAYS 21 capsule 13  . Insulin Admin Supplies MISC Inject 24 Units into the skin daily.    . Insulin Pen Needle 31G X 5 MM MISC UAD for Sq inj for DM qd  100 each PRN  . letrozole (FEMARA) 2.5 MG tablet Take 2.5 mg by mouth daily.    Marland Kitchen loratadine (CLARITIN) 10 MG tablet Take 10 mg by mouth daily.    Marland Kitchen losartan (COZAAR) 100 MG tablet Take 100 mg by mouth daily.    . metoprolol succinate (TOPROL-XL) 25 MG 24 hr tablet Take 1 tablet (25 mg total) by mouth daily. 90 tablet 1  . oxyCODONE-acetaminophen (PERCOCET) 10-325 MG tablet Take 1 tablet by mouth every 6 (six) hours as needed for pain. 140 tablet 0  . pantoprazole (PROTONIX) 40 MG tablet Take 1 tablet (40 mg total) by mouth daily. 90 tablet 1  . pioglitazone (ACTOS) 15 MG tablet Take 1 tablet (15 mg total) by mouth daily. 90 tablet 1  . pravastatin (PRAVACHOL) 40 MG tablet Take 40 mg by mouth daily.    . sitaGLIPtin-metformin (JANUMET) 50-1000 MG tablet Take 1 tablet by mouth daily.     No current facility-administered medications for this visit.     OBJECTIVE: Middle-aged white [name] who appears in moderate pain Vitals:   11/06/18 1110  BP: 115/76  Pulse: 80  Resp: 18  Temp: 98.7 F (37.1 C)  SpO2: 96%     Body mass index is 38.9 kg/m.   Wt Readings from Last 3 Encounters:  11/06/18 226 lb 9.6 oz (102.8 kg)  10/09/18 223 lb 6.4 oz (101.3 kg)  10/02/18 225 lb 6 oz (102.2 kg)      ECOG FS:2 - Symptomatic, <50% confined to bed  Sclerae unicteric, EOMs intact No cervical or supraclavicular adenopathy Lungs no rales or rhonchi Heart regular rate and rhythm Abd soft, nontender, positive bowel sounds MSK no focal spinal tenderness, chronic right upper extremity lymphedema without erythema Neuro: nonfocal, well oriented, appropriate affect Breasts: Right breast is status post mastectomy with no evidence of chest wall recurrence per the left breast is benign.  Both axillae are benign.  LAB RESULTS:  CMP     Component Value Date/Time   NA 139 11/06/2018 1041   NA 140 01/31/2018   K 4.8 11/06/2018 1041   CL 103 11/06/2018 1041   CO2 27 11/06/2018 1041   GLUCOSE 149 (H)  11/06/2018 1041   BUN 18 11/06/2018 1041   BUN 22 (A) 01/31/2018   CREATININE 0.85 11/06/2018 1041   CALCIUM 9.2 11/06/2018 1041   PROT 6.9 11/06/2018 1041   ALBUMIN 3.5 11/06/2018 1041   AST 16 11/06/2018 1041   ALT 15 11/06/2018 1041   ALKPHOS 91 11/06/2018 1041   BILITOT 0.5 11/06/2018 1041   GFRNONAA >60 11/06/2018 1041   GFRAA >60 11/06/2018 1041  No results found for: TOTALPROTELP, ALBUMINELP, A1GS, A2GS, BETS, BETA2SER, GAMS, MSPIKE, SPEI  No results found for: KPAFRELGTCHN, LAMBDASER, KAPLAMBRATIO  Lab Results  Component Value Date   WBC 3.6 (L) 11/06/2018   NEUTROABS 2.4 11/06/2018   HGB 11.3 (L) 11/06/2018   HCT 34.8 (L) 11/06/2018   MCV 105.5 (H) 11/06/2018   PLT 190 11/06/2018    _0 @  No results found for: LABCA2  No components found for: [id number]  No results for input(s): INR in the last 168 hours.  No results found for: LABCA2  No results found for: OZD664  No results found for: QIH474  No results found for: QVZ563  Lab Results  Component Value Date   CA2729 127.7 (H) 10/09/2018    No components found for: HGQUANT  No results found for: CEA1 / No results found for: CEA1   No results found for: AFPTUMOR  No results found for: [place]  No results found for: PSA1  Appointment on 11/06/2018  Component Date Value Ref Range Status  . Sodium 11/06/2018 139  135 - 145 mmol/L Final  . Potassium 11/06/2018 4.8  3.5 - 5.1 mmol/L Final  . Chloride 11/06/2018 103  98 - 111 mmol/L Final  . CO2 11/06/2018 27  22 - 32 mmol/L Final  . Glucose, Bld 11/06/2018 149* 70 - 99 mg/dL Final  . BUN 11/06/2018 18  8 - 23 mg/dL Final  . Creatinine, Ser 11/06/2018 0.85  0.44 - 1.00 mg/dL Final  . Calcium 11/06/2018 9.2  8.9 - 10.3 mg/dL Final  . Total Protein 11/06/2018 6.9  6.5 - 8.1 g/dL Final  . Albumin 11/06/2018 3.5  3.5 - 5.0 g/dL Final  . AST 11/06/2018 16  15 - 41 U/L Final  . ALT 11/06/2018 15  0 - 44 U/L Final  . Alkaline  Phosphatase 11/06/2018 91  38 - 126 U/L Final  . Total Bilirubin 11/06/2018 0.5  0.3 - 1.2 mg/dL Final  . GFR calc non Af Amer 11/06/2018 >60  >60 mL/min Final  . GFR calc Af Amer 11/06/2018 >60  >60 mL/min Final  . Anion gap 11/06/2018 9  5 - 15 Final   Performed at [facility], [place] [address], [place], [place] [address]  . WBC 11/06/2018 3.6* 4.0 - 10.5 K/uL Final  . RBC 11/06/2018 3.30* 3.87 - 5.11 MIL/uL Final  . Hemoglobin 11/06/2018 11.3* 12.0 - 15.0 g/dL Final  . HCT 11/06/2018 34.8* 36.0 - 46.0 % Final  . MCV 11/06/2018 105.5* 80.0 - 100.0 fL Final  . MCH 11/06/2018 34.2* 26.0 - 34.0 pg Final  . MCHC 11/06/2018 32.5  30.0 - 36.0 g/dL Final  . RDW 11/06/2018 14.1  11.5 - 15.5 % Final  . Platelets 11/06/2018 190  150 - 400 K/uL Final  . nRBC 11/06/2018 0.0  0.0 - 0.2 % Final  . Neutrophils Relative % 11/06/2018 69  % Final  . Neutro Abs 11/06/2018 2.4  1.7 - 7.7 K/uL Final  . Lymphocytes Relative 11/06/2018 19  % Final  . Lymphs Abs 11/06/2018 0.7  0.7 - 4.0 K/uL Final  . Monocytes Relative 11/06/2018 10  % Final  . Monocytes Absolute 11/06/2018 0.4  0.1 - 1.0 K/uL Final  . Eosinophils Relative 11/06/2018 1  % Final  . Eosinophils Absolute 11/06/2018 0.1  0.0 - 0.5 K/uL Final  . Basophils Relative 11/06/2018 1  % Final  . Basophils Absolute 11/06/2018 0.1  0.0 - 0.1 K/uL Final  . Immature  Granulocytes 11/06/2018 0  % Final  . Abs Immature Granulocytes 11/06/2018 0.01  0.00 - 0.07 K/uL Final   Performed at [facility], [place] [address] [place], [place] [address]    (this displays the last labs from the last 3 days)  No results found for: TOTALPROTELP, ALBUMINELP, A1GS, A2GS, BETS, BETA2SER, GAMS, MSPIKE, SPEI (this displays SPEP labs)  No results found for: KPAFRELGTCHN, LAMBDASER, KAPLAMBRATIO (kappa/lambda light chains)  No results found for: HGBA, HGBA2QUANT, HGBFQUANT, HGBSQUAN (Hemoglobinopathy evaluation)   No  results found for: LDH  No results found for: IRON, TIBC, IRONPCTSAT (Iron and TIBC)  No results found for: FERRITIN  Urinalysis No results found for: COLORURINE, APPEARANCEUR, LABSPEC, PHURINE, GLUCOSEU, HGBUR, BILIRUBINUR, KETONESUR, PROTEINUR, UROBILINOGEN, NITRITE, LEUKOCYTESUR   STUDIES: His recent scans reviewed  ELIGIBLE FOR AVAILABLE RESEARCH PROTOCOL: no   ASSESSMENT: 63 y.o. Mia [name], Mia [name] with stage IV breast cancer, as follows:  (1) status post right mastectomy in 2000, for a pT2 pN2, stage IIIA invasive ductal carcinoma, estrogen receptor positive, progesterone receptor not tested, HER-2 negative (0) by immunohistochemistry  (a) adjuvant chemotherapy with doxorubicin and cyclophosphamide in dose dense fashion x4 followed by paclitaxel in dose dense fashion x4  (b) adjuvant radiation: 30 doses  (c) antiestrogens: Tamoxifen for 5 years, completed 2005  (2) METASTASTIC DISEASE: 2008, involving bones, lungs, and lymph nodes  (a) CA 27-29 is informative  (b) CT of the chest abdomen and pelvis in [facility] finds stable sclerotic metastases (T8, T10, T11, sternum, L4, L5); 0.6 cm left upper lobe lung nodule stable  (3)  prior anti-estrogen treatments:  (a) fulvestrant--progression  (b) exemestane/everolimus--hyperlipidemia, hyperglycemia  (4) prior chemotherapy treatments:  (a) capecitabine: progression  (b) Abraxane, August 2014 through 11/18/2015: good response but stopped due to neuropathy  (c) gemcitabine 01/20/2016--with multiple interruptions secondary to infections  (5) radiation therapy:  (a) T spine and Right femur, completed 01/10/2016  (6) letrozole/ palbociclib started 07/13/2016  (7) bone treatment:  (a) denosumab/Xgeva--discontinued January 2016 due to osteonecrosis of the jaw  (8) cancer associated pain:  (a) currently on oxycodone/APAP 10/325 QID as needed  (b) PMP Aware accessed 11/06/2018: Last prescription 10/08/2018  (9)  restaging studies obtained November/December 2019 show  (a) mostly stable bone lesions with evidence of progression at L3-L5; no epidural tumor by total spinal MRI in November 2019  (b) no evidence of progressive lung, lymph node or liver lesions by CT scans of the chest abdomen and pelvis and bone scan December 2019   PLAN: Mia [name] is now 11-1/2 years out from definitive diagnosis of metastatic breast cancer.  Her disease is moderately well controlled on ribociclib and letrozole.  She tolerates these well and she will start her next palbociclib cycle on 11/08/2018  However she is having more pain and we are noting an updrift in her tumor marker numbers.  I think she will benefit from her radiation in terms of pain control.  For now I am increasing her Percocet to 140 instead of 120 per 4 weeks.  She will see me 12/04/2018 for her next lab check and at that point I will set her up for restaging scans before she returns to see me in April  She knows to call for any other issues that may develop before the next visit.   , Mia Dad, MD  11/06/18 11:37 AM Medical Oncology and Hematology [facility] [address] [place], [place] [address] Tel. [phone]    Fax.  [phone]  I, Jacqualyn Posey am acting as a Education administrator for Chauncey Cruel, MD.   I, Lurline Del MD, have reviewed the above documentation for accuracy and completeness, and I agree with the above.

## 2018-11-06 ENCOUNTER — Inpatient Hospital Stay: Payer: MEDICARE | Attending: Oncology

## 2018-11-06 ENCOUNTER — Inpatient Hospital Stay (HOSPITAL_BASED_OUTPATIENT_CLINIC_OR_DEPARTMENT_OTHER): Payer: MEDICARE | Admitting: Oncology

## 2018-11-06 VITALS — BP 115/76 | HR 80 | Temp 98.7°F | Resp 18 | Ht 64.0 in | Wt 226.6 lb

## 2018-11-06 DIAGNOSIS — Z170 Estrogen receptor positive status [ER+]: Secondary | ICD-10-CM | POA: Insufficient documentation

## 2018-11-06 DIAGNOSIS — G629 Polyneuropathy, unspecified: Secondary | ICD-10-CM

## 2018-11-06 DIAGNOSIS — C50911 Malignant neoplasm of unspecified site of right female breast: Secondary | ICD-10-CM | POA: Diagnosis not present

## 2018-11-06 DIAGNOSIS — E669 Obesity, unspecified: Secondary | ICD-10-CM | POA: Diagnosis not present

## 2018-11-06 DIAGNOSIS — G893 Neoplasm related pain (acute) (chronic): Secondary | ICD-10-CM

## 2018-11-06 DIAGNOSIS — Z79899 Other long term (current) drug therapy: Secondary | ICD-10-CM | POA: Insufficient documentation

## 2018-11-06 DIAGNOSIS — C7951 Secondary malignant neoplasm of bone: Secondary | ICD-10-CM

## 2018-11-06 DIAGNOSIS — E785 Hyperlipidemia, unspecified: Secondary | ICD-10-CM | POA: Insufficient documentation

## 2018-11-06 DIAGNOSIS — R739 Hyperglycemia, unspecified: Secondary | ICD-10-CM | POA: Insufficient documentation

## 2018-11-06 DIAGNOSIS — I700 Atherosclerosis of aorta: Secondary | ICD-10-CM

## 2018-11-06 DIAGNOSIS — Z90722 Acquired absence of ovaries, bilateral: Secondary | ICD-10-CM

## 2018-11-06 DIAGNOSIS — C50919 Malignant neoplasm of unspecified site of unspecified female breast: Secondary | ICD-10-CM

## 2018-11-06 DIAGNOSIS — Z9011 Acquired absence of right breast and nipple: Secondary | ICD-10-CM | POA: Diagnosis not present

## 2018-11-06 DIAGNOSIS — R110 Nausea: Secondary | ICD-10-CM

## 2018-11-06 DIAGNOSIS — I10 Essential (primary) hypertension: Secondary | ICD-10-CM | POA: Insufficient documentation

## 2018-11-06 DIAGNOSIS — C779 Secondary and unspecified malignant neoplasm of lymph node, unspecified: Secondary | ICD-10-CM | POA: Diagnosis not present

## 2018-11-06 DIAGNOSIS — G620 Drug-induced polyneuropathy: Secondary | ICD-10-CM

## 2018-11-06 DIAGNOSIS — R61 Generalized hyperhidrosis: Secondary | ICD-10-CM | POA: Insufficient documentation

## 2018-11-06 DIAGNOSIS — E1169 Type 2 diabetes mellitus with other specified complication: Secondary | ICD-10-CM

## 2018-11-06 DIAGNOSIS — Z7982 Long term (current) use of aspirin: Secondary | ICD-10-CM | POA: Diagnosis not present

## 2018-11-06 DIAGNOSIS — Z79811 Long term (current) use of aromatase inhibitors: Secondary | ICD-10-CM | POA: Insufficient documentation

## 2018-11-06 DIAGNOSIS — C7800 Secondary malignant neoplasm of unspecified lung: Secondary | ICD-10-CM

## 2018-11-06 DIAGNOSIS — Z90710 Acquired absence of both cervix and uterus: Secondary | ICD-10-CM | POA: Diagnosis not present

## 2018-11-06 DIAGNOSIS — T451X5A Adverse effect of antineoplastic and immunosuppressive drugs, initial encounter: Secondary | ICD-10-CM

## 2018-11-06 DIAGNOSIS — M879 Osteonecrosis, unspecified: Secondary | ICD-10-CM

## 2018-11-06 LAB — CBC WITH DIFFERENTIAL/PLATELET
Abs Immature Granulocytes: 0.01 10*3/uL (ref 0.00–0.07)
BASOS PCT: 1 %
Basophils Absolute: 0.1 10*3/uL (ref 0.0–0.1)
EOS ABS: 0.1 10*3/uL (ref 0.0–0.5)
Eosinophils Relative: 1 %
HCT: 34.8 % — ABNORMAL LOW (ref 36.0–46.0)
Hemoglobin: 11.3 g/dL — ABNORMAL LOW (ref 12.0–15.0)
IMMATURE GRANULOCYTES: 0 %
Lymphocytes Relative: 19 %
Lymphs Abs: 0.7 10*3/uL (ref 0.7–4.0)
MCH: 34.2 pg — ABNORMAL HIGH (ref 26.0–34.0)
MCHC: 32.5 g/dL (ref 30.0–36.0)
MCV: 105.5 fL — ABNORMAL HIGH (ref 80.0–100.0)
MONO ABS: 0.4 10*3/uL (ref 0.1–1.0)
MONOS PCT: 10 %
NEUTROS PCT: 69 %
Neutro Abs: 2.4 10*3/uL (ref 1.7–7.7)
PLATELETS: 190 10*3/uL (ref 150–400)
RBC: 3.3 MIL/uL — ABNORMAL LOW (ref 3.87–5.11)
RDW: 14.1 % (ref 11.5–15.5)
WBC: 3.6 10*3/uL — AB (ref 4.0–10.5)
nRBC: 0 % (ref 0.0–0.2)

## 2018-11-06 LAB — COMPREHENSIVE METABOLIC PANEL
ALT: 15 U/L (ref 0–44)
AST: 16 U/L (ref 15–41)
Albumin: 3.5 g/dL (ref 3.5–5.0)
Alkaline Phosphatase: 91 U/L (ref 38–126)
Anion gap: 9 (ref 5–15)
BUN: 18 mg/dL (ref 8–23)
CALCIUM: 9.2 mg/dL (ref 8.9–10.3)
CHLORIDE: 103 mmol/L (ref 98–111)
CO2: 27 mmol/L (ref 22–32)
Creatinine, Ser: 0.85 mg/dL (ref 0.44–1.00)
GFR calc non Af Amer: >60 mL/min (ref >60)
GLUCOSE: 149 mg/dL — AB (ref 70–99)
Potassium: 4.8 mmol/L (ref 3.5–5.1)
SODIUM: 139 mmol/L (ref 135–145)
TOTAL PROTEIN: 6.9 g/dL (ref 6.5–8.1)
Total Bilirubin: 0.5 mg/dL (ref 0.3–1.2)

## 2018-11-06 MED ORDER — OXYCODONE-ACETAMINOPHEN 10-325 MG PO TABS: 1.0000 | ORAL_TABLET | Freq: Four times a day (QID) | ORAL | 0 refills | Status: DC | PRN

## 2018-11-07 ENCOUNTER — Telehealth: Payer: Self-pay | Admitting: Oncology

## 2018-11-07 LAB — CANCER ANTIGEN 27.29: CA 27.29: 119.3 U/mL — ABNORMAL HIGH (ref 0.0–38.6)

## 2018-11-07 NOTE — Telephone Encounter (Signed)
Gave avs and calendar ° °

## 2018-11-11 ENCOUNTER — Ambulatory Visit
Admission: RE | Admit: 2018-11-11 | Discharge: 2018-11-11 | Disposition: A | Discharge status: Home / Self Care | Payer: MEDICARE | Source: Ambulatory Visit | Attending: Radiation Oncology | Admitting: Radiation Oncology

## 2018-11-11 DIAGNOSIS — C50911 Malignant neoplasm of unspecified site of right female breast: Secondary | ICD-10-CM | POA: Diagnosis not present

## 2018-11-11 DIAGNOSIS — Z510 Encounter for antineoplastic radiation therapy: Secondary | ICD-10-CM | POA: Insufficient documentation

## 2018-11-11 DIAGNOSIS — C7951 Secondary malignant neoplasm of bone: Secondary | ICD-10-CM

## 2018-11-11 DIAGNOSIS — Z170 Estrogen receptor positive status [ER+]: Secondary | ICD-10-CM | POA: Diagnosis not present

## 2018-11-13 DIAGNOSIS — Z170 Estrogen receptor positive status [ER+]: Secondary | ICD-10-CM | POA: Diagnosis not present

## 2018-11-13 DIAGNOSIS — Z510 Encounter for antineoplastic radiation therapy: Secondary | ICD-10-CM | POA: Diagnosis not present

## 2018-11-13 DIAGNOSIS — C50911 Malignant neoplasm of unspecified site of right female breast: Secondary | ICD-10-CM | POA: Diagnosis not present

## 2018-11-13 DIAGNOSIS — C7951 Secondary malignant neoplasm of bone: Secondary | ICD-10-CM | POA: Diagnosis not present

## 2018-11-18 ENCOUNTER — Ambulatory Visit
Admission: RE | Admit: 2018-11-18 | Discharge: 2018-11-18 | Disposition: A | Discharge status: Home / Self Care | Payer: MEDICARE | Source: Ambulatory Visit | Attending: Radiation Oncology | Admitting: Radiation Oncology

## 2018-11-18 DIAGNOSIS — C7951 Secondary malignant neoplasm of bone: Secondary | ICD-10-CM

## 2018-11-18 DIAGNOSIS — Z510 Encounter for antineoplastic radiation therapy: Secondary | ICD-10-CM | POA: Diagnosis not present

## 2018-11-18 DIAGNOSIS — Z170 Estrogen receptor positive status [ER+]: Secondary | ICD-10-CM | POA: Diagnosis not present

## 2018-11-18 DIAGNOSIS — C50911 Malignant neoplasm of unspecified site of right female breast: Secondary | ICD-10-CM | POA: Diagnosis not present

## 2018-11-18 NOTE — Progress Notes (Signed)
  Radiation Oncology         [phone]) [phone] ________________________________  Name: Mia [name] MRN: [id number]  Date: 11/11/2018  DOB: 11-05-1955  SIMULATION AND TREATMENT PLANNING NOTE    ICD-10-CM   1. Bone metastases (HCC) C79.51     DIAGNOSIS:  Metastatic breast cancer  NARRATIVE:  The patient was brought to the [facility].  Identity was confirmed.  All relevant records and images related to the planned course of therapy were reviewed.  The patient freely provided informed written consent to proceed with treatment after reviewing the details related to the planned course of therapy. The consent form was witnessed and verified by the simulation staff.  Then, the patient was set-up in a stable reproducible  supine position for radiation therapy.  CT images were obtained.  Surface markings were placed.  The CT images were loaded into the planning software.  Then the target and avoidance structures were contoured.  Treatment planning then occurred.  The radiation prescription was entered and confirmed.  Then, I designed and supervised the construction of a total of 6 medically necessary complex treatment devices.  I have requested : 3D Simulation  I have requested a DVH of the following structures: GTV, spinal cord, kidneys.  I have ordered:dose calc.  PLAN:  The patient will receive 35 Gy in 14 fractions directed at the lumbar spine and left lower pelvis region. The patient will be treated with 2 separate radiation fields.  -----------------------------------  Blair Promise, PhD, MD

## 2018-11-18 NOTE — Progress Notes (Signed)
  Radiation Oncology         [phone] ________________________________  Name: Mia [name] MRN: [id number]  Date: 11/18/2018  DOB: [birth year]  Simulation Verification Note    ICD-10-CM   1. Bone metastases (HCC) C79.51     Status: outpatient  NARRATIVE: The patient was brought to the treatment unit and placed in the planned treatment position. The clinical setup was verified. Then port films were obtained and uploaded to the radiation oncology medical record software.  The treatment beams were carefully compared against the planned radiation fields. The position location and shape of the radiation fields was reviewed. They targeted volume of tissue appears to be appropriately covered by the radiation beams. Organs at risk appear to be excluded as planned.  Based on my personal review, I approved the simulation verification. The patient's treatment will proceed as planned.  -----------------------------------  Blair Promise, PhD, MD

## 2018-11-19 ENCOUNTER — Ambulatory Visit
Admission: RE | Admit: 2018-11-19 | Discharge: 2018-11-19 | Disposition: A | Discharge status: Home / Self Care | Payer: MEDICARE | Source: Ambulatory Visit | Attending: Radiation Oncology | Admitting: Radiation Oncology

## 2018-11-19 DIAGNOSIS — Z510 Encounter for antineoplastic radiation therapy: Secondary | ICD-10-CM | POA: Diagnosis not present

## 2018-11-19 DIAGNOSIS — C7951 Secondary malignant neoplasm of bone: Secondary | ICD-10-CM | POA: Diagnosis not present

## 2018-11-19 DIAGNOSIS — Z170 Estrogen receptor positive status [ER+]: Secondary | ICD-10-CM | POA: Diagnosis not present

## 2018-11-19 DIAGNOSIS — C50911 Malignant neoplasm of unspecified site of right female breast: Secondary | ICD-10-CM | POA: Diagnosis not present

## 2018-11-20 ENCOUNTER — Ambulatory Visit
Admission: RE | Admit: 2018-11-20 | Discharge: 2018-11-20 | Disposition: A | Discharge status: Home / Self Care | Payer: MEDICARE | Source: Ambulatory Visit | Attending: Radiation Oncology | Admitting: Radiation Oncology

## 2018-11-20 DIAGNOSIS — C7951 Secondary malignant neoplasm of bone: Secondary | ICD-10-CM | POA: Diagnosis not present

## 2018-11-20 DIAGNOSIS — C50911 Malignant neoplasm of unspecified site of right female breast: Secondary | ICD-10-CM | POA: Diagnosis not present

## 2018-11-20 DIAGNOSIS — Z510 Encounter for antineoplastic radiation therapy: Secondary | ICD-10-CM | POA: Diagnosis not present

## 2018-11-20 DIAGNOSIS — Z170 Estrogen receptor positive status [ER+]: Secondary | ICD-10-CM | POA: Diagnosis not present

## 2018-11-21 ENCOUNTER — Ambulatory Visit
Admission: RE | Admit: 2018-11-21 | Discharge: 2018-11-21 | Disposition: A | Discharge status: Home / Self Care | Payer: MEDICARE | Source: Ambulatory Visit | Attending: Radiation Oncology | Admitting: Radiation Oncology

## 2018-11-21 DIAGNOSIS — C50911 Malignant neoplasm of unspecified site of right female breast: Secondary | ICD-10-CM | POA: Diagnosis not present

## 2018-11-21 DIAGNOSIS — Z510 Encounter for antineoplastic radiation therapy: Secondary | ICD-10-CM | POA: Diagnosis not present

## 2018-11-21 DIAGNOSIS — Z170 Estrogen receptor positive status [ER+]: Secondary | ICD-10-CM | POA: Diagnosis not present

## 2018-11-21 DIAGNOSIS — C7951 Secondary malignant neoplasm of bone: Secondary | ICD-10-CM | POA: Diagnosis not present

## 2018-11-24 ENCOUNTER — Ambulatory Visit
Admission: RE | Admit: 2018-11-24 | Discharge: 2018-11-24 | Disposition: A | Discharge status: Home / Self Care | Payer: MEDICARE | Source: Ambulatory Visit | Attending: Radiation Oncology | Admitting: Radiation Oncology

## 2018-11-24 DIAGNOSIS — C50911 Malignant neoplasm of unspecified site of right female breast: Secondary | ICD-10-CM | POA: Diagnosis not present

## 2018-11-24 DIAGNOSIS — Z170 Estrogen receptor positive status [ER+]: Secondary | ICD-10-CM | POA: Diagnosis not present

## 2018-11-24 DIAGNOSIS — Z510 Encounter for antineoplastic radiation therapy: Secondary | ICD-10-CM | POA: Diagnosis not present

## 2018-11-24 DIAGNOSIS — C7951 Secondary malignant neoplasm of bone: Secondary | ICD-10-CM | POA: Diagnosis not present

## 2018-11-25 ENCOUNTER — Ambulatory Visit
Admission: RE | Admit: 2018-11-25 | Discharge: 2018-11-25 | Disposition: A | Discharge status: Home / Self Care | Payer: MEDICARE | Source: Ambulatory Visit | Attending: Radiation Oncology | Admitting: Radiation Oncology

## 2018-11-25 DIAGNOSIS — C50911 Malignant neoplasm of unspecified site of right female breast: Secondary | ICD-10-CM | POA: Diagnosis not present

## 2018-11-25 DIAGNOSIS — Z170 Estrogen receptor positive status [ER+]: Secondary | ICD-10-CM | POA: Diagnosis not present

## 2018-11-25 DIAGNOSIS — C7951 Secondary malignant neoplasm of bone: Secondary | ICD-10-CM | POA: Diagnosis not present

## 2018-11-25 DIAGNOSIS — Z510 Encounter for antineoplastic radiation therapy: Secondary | ICD-10-CM | POA: Diagnosis not present

## 2018-11-26 ENCOUNTER — Ambulatory Visit
Admission: RE | Admit: 2018-11-26 | Discharge: 2018-11-26 | Disposition: A | Discharge status: Home / Self Care | Payer: MEDICARE | Source: Ambulatory Visit | Attending: Radiation Oncology | Admitting: Radiation Oncology

## 2018-11-26 DIAGNOSIS — Z510 Encounter for antineoplastic radiation therapy: Secondary | ICD-10-CM | POA: Diagnosis not present

## 2018-11-26 DIAGNOSIS — C7951 Secondary malignant neoplasm of bone: Secondary | ICD-10-CM | POA: Diagnosis not present

## 2018-11-26 DIAGNOSIS — Z170 Estrogen receptor positive status [ER+]: Secondary | ICD-10-CM | POA: Diagnosis not present

## 2018-11-26 DIAGNOSIS — C50911 Malignant neoplasm of unspecified site of right female breast: Secondary | ICD-10-CM | POA: Diagnosis not present

## 2018-11-27 ENCOUNTER — Ambulatory Visit
Admission: RE | Admit: 2018-11-27 | Discharge: 2018-11-27 | Disposition: A | Discharge status: Home / Self Care | Payer: MEDICARE | Source: Ambulatory Visit | Attending: Radiation Oncology | Admitting: Radiation Oncology

## 2018-11-27 DIAGNOSIS — Z510 Encounter for antineoplastic radiation therapy: Secondary | ICD-10-CM | POA: Diagnosis not present

## 2018-11-27 DIAGNOSIS — C50911 Malignant neoplasm of unspecified site of right female breast: Secondary | ICD-10-CM | POA: Diagnosis not present

## 2018-11-27 DIAGNOSIS — C7951 Secondary malignant neoplasm of bone: Secondary | ICD-10-CM | POA: Diagnosis not present

## 2018-11-27 DIAGNOSIS — Z170 Estrogen receptor positive status [ER+]: Secondary | ICD-10-CM | POA: Diagnosis not present

## 2018-11-28 ENCOUNTER — Ambulatory Visit
Admission: RE | Admit: 2018-11-28 | Discharge: 2018-11-28 | Disposition: A | Discharge status: Home / Self Care | Payer: MEDICARE | Source: Ambulatory Visit | Attending: Radiation Oncology | Admitting: Radiation Oncology

## 2018-11-28 DIAGNOSIS — Z170 Estrogen receptor positive status [ER+]: Secondary | ICD-10-CM | POA: Diagnosis not present

## 2018-11-28 DIAGNOSIS — C7951 Secondary malignant neoplasm of bone: Secondary | ICD-10-CM | POA: Diagnosis not present

## 2018-11-28 DIAGNOSIS — Z510 Encounter for antineoplastic radiation therapy: Secondary | ICD-10-CM | POA: Diagnosis not present

## 2018-11-28 DIAGNOSIS — C50911 Malignant neoplasm of unspecified site of right female breast: Secondary | ICD-10-CM | POA: Diagnosis not present

## 2018-12-01 ENCOUNTER — Ambulatory Visit
Admission: RE | Admit: 2018-12-01 | Discharge: 2018-12-01 | Disposition: A | Discharge status: Home / Self Care | Payer: MEDICARE | Source: Ambulatory Visit | Attending: Radiation Oncology | Admitting: Radiation Oncology

## 2018-12-01 DIAGNOSIS — C50911 Malignant neoplasm of unspecified site of right female breast: Secondary | ICD-10-CM | POA: Insufficient documentation

## 2018-12-01 DIAGNOSIS — Z170 Estrogen receptor positive status [ER+]: Secondary | ICD-10-CM | POA: Diagnosis not present

## 2018-12-01 DIAGNOSIS — C7951 Secondary malignant neoplasm of bone: Secondary | ICD-10-CM | POA: Diagnosis not present

## 2018-12-01 DIAGNOSIS — Z510 Encounter for antineoplastic radiation therapy: Secondary | ICD-10-CM | POA: Insufficient documentation

## 2018-12-02 ENCOUNTER — Ambulatory Visit
Admission: RE | Admit: 2018-12-02 | Discharge: 2018-12-02 | Disposition: A | Discharge status: Home / Self Care | Payer: MEDICARE | Source: Ambulatory Visit | Attending: Radiation Oncology | Admitting: Radiation Oncology

## 2018-12-02 DIAGNOSIS — Z170 Estrogen receptor positive status [ER+]: Secondary | ICD-10-CM | POA: Diagnosis not present

## 2018-12-02 DIAGNOSIS — Z510 Encounter for antineoplastic radiation therapy: Secondary | ICD-10-CM | POA: Diagnosis not present

## 2018-12-02 DIAGNOSIS — C7951 Secondary malignant neoplasm of bone: Secondary | ICD-10-CM | POA: Diagnosis not present

## 2018-12-02 DIAGNOSIS — C50911 Malignant neoplasm of unspecified site of right female breast: Secondary | ICD-10-CM | POA: Diagnosis not present

## 2018-12-03 ENCOUNTER — Ambulatory Visit: Payer: MEDICARE

## 2018-12-03 NOTE — Progress Notes (Signed)
[place]  [phone] [phone]    ID: Mia [name] DOB: 08/08/1956  MR#: [id number]  QIH#:[id number]  Patient Care Team: Lucille Passy, MD as PCP - General (Family Medicine) Latoy Labriola, Virgie Dad, MD as Consulting Physician (Oncology) [name], [name] PA   CHIEF COMPLAINT: Estrogen receptor positive stage IV breast cancer  CURRENT TREATMENT: Letrozole, palbociclib   INTERVAL HISTORY: Mia [name] returns today for follow-up and treatment of her etrogen receptor positive stage IV breast cancer.   She continues on letrozole.   She also continues on palbociclib. She is currently in her off week. She tolerates this well and without any noticeable side effects.    Since her last visit here, she has not undergone any additional studies.   She completed radiation early receiving 11 of the 14 treatments because of abdominal discomfort and fatigue.   Results for Mia, [name] (MRN [id number]) as of 12/04/2018 11:00  Ref. Range 07/17/2018 11:02 08/14/2018 10:52 09/11/2018 11:06 10/09/2018 11:07 11/06/2018 10:41  CA 27.29 Latest Ref Range: 0.0 - 38.6 U/mL 96.2 (H) 90.6 (H) 102.3 (H) 127.7 (H) 119.3 (H)     REVIEW OF SYSTEMS: Mia [name] began to have terrible abdominal pains on 11/29/2018. Today, she still has some pain, but it is improved since then. She was running a subjective fever of 100.3 on 12/03/2018; Today her temperature is objectively slightly elevated at 99.4 degrees farenheit. She states that her back feels better than before, but she still feels some pain in her hip and groin area. On 12/02/2018 she had a bout of diarrhea, which she took imodium for; she feels like she is having normal bowel movements now. She is extremely fatigued, but she makes it a point to get up at least once per hour and move around at home. The patient denies unusual headaches, visual changes, nausea, vomiting, or dizziness. There has been no  unusual cough, phlegm production, or pleurisy. This been no change in bladder habits. The patient denies unexplained weight loss, bleeding, or rash. A detailed review of systems was otherwise noncontributory.    HISTORY OF CURRENT ILLNESS: From the original intake note:  We have reviewed the medical records from [place], which is the source of the information below:  Mia [name] was initially diagnosed with Stage IIIA (T2N2M0) invasive ductal carcinoma, estrogen receptor positive and HER2 negative right breast cancer in 2000. She underwent right mastectomy, with 4 positive metastatic lymph nodes. She had chemotherapy with taxol and cytoxan and fulvestrant in the past. She had radiation and completed 5 years of tamoxifen.   She was diagnosed with Stage IV disease 04/18/2005 with metastases to the bone, lung, and additional nodes. She was on capecitabine but was discontinued after rising CA 27-29 and scans.   She started anastrozole and everolimus with Delton See in April 2014. She tolerated this well, but this was discontinued in July 2014 due to abnormal blood tests, hyperlipidemia and hyperglycemia.  She began Abraxane 171m /m2 and Xgeva in August 2014 weekly x3 with 1 week off. She tolerated this treatment well. She discontinued Xgeva January 2016 due to osteonecrosis of the jaw and mouth issues. She continued abraxane alone until her last dose on 11/18/2015 due to neuropathy and disease progression with restaging studies on 11/23/2015 with a CT chest abdomen and pelvis and one scan showing skull, sternal and femoral lesions.   She was switched to gemcitabine starting 01/20/2016 weekly x3 and 1 week off. This was discontinued on 06/15/2016 due  to a port related jugular clot on the right side. She was given Lovenox for 3 months.  She was switched to Letrozole 2.5 mg and palbociclib 125 mg starting 07/13/2016. She tolerated this well. Restaging studies with CT chest abdomen and pelvis  and bone scan on 07/01/2017 showed stable/ improving lesions. CA 27-29 was stable.  During the last few months of follow up in [place], she completed restaging studies with a CT chest abdomen and pelvis at [facility] on 12/17/2017 showing: A 6 mm pulmonary nodule in the left upper lobe laterally. Progression of metastatic disease to the bones at the L4 and L5 vertebral bodies. Sclerotic metastases at T8, T10, an T11, are similar to previous exams. Sclerotic metastases in the sternum stable. Hepatic steatosis. Aortic atherosclerosis.  Most recent CA-32-29 (January 2019) was 58.  Most recent hemoglobin A1c was 7.1 according to the patient  The patient's subsequent history is as detailed below.   PAST MEDICAL HISTORY: Past Medical History:  Diagnosis Date  . Breast cancer metastasized to bone ([place])   . Hyperlipidemia   . Hypertension   . Lymphedema of right arm   . Neuropathy associated with cancer ([place])   . Obesity (BMI 35.0-39.9 without comorbidity)      PAST SURGICAL HISTORY: Past Surgical History:  Procedure Laterality Date  . CATARACT EXTRACTION Left   . HYSTERECTOMY ABDOMINAL WITH SALPINGO-OOPHORECTOMY    . MODIFIED RADICAL MASTECTOMY Right       FAMILY HISTORY: No family history on file.  The patient reports she had negative genetic testing at [facility] 2014,report not available. --The patient's father died at age 88 due to a PE, and he also had a history of colon cancer diagnosed at age 43. The patient's mother died at age 70 due to diabetes and survived a cerebral aneurysm.The patient's mother also had a history of breast cancer diagnosed at age 59.  The patient has no brothers and 1 sister. The patient's sister was diagnosed with non invasive breast cancer at age 13. There was a paternal grandmother diagnosed with breast cancer at age 62. The patient denies a family history of ovarian cancer.    GYNECOLOGIC HISTORY:  Patient's last menstrual period was  10/01/2006. Menarche: 63 years old Age at first live birth: 63 years old She is [place] P2.  She is status post hysterectomy with bilateral salpingo- oophorectomy in 2009 at about age 30.  She never used HRT.    SOCIAL HISTORY:  Mia [name] is disabled due to her breast cancer. She used to be Surveyor, quantity for a cardiology office. Her husband, [name] is in the process of retiring. He is a Electrical engineer for a power company. The patient's daughter, Mia [name], lives in [place] and works as a Teaching laboratory technician. The patient's second daughter, Mia [name] is a stay at home mom and is a special needs teacher, who will soon move to [facility] [place] The patient plans on attending [employer].     ADVANCED DIRECTIVES:    HEALTH MAINTENANCE: Social History   Tobacco Use  . Smoking status: Never Smoker  . Smokeless tobacco: Never Used  Substance Use Topics  . Alcohol use: Not Currently  . Drug use: Never    Colonoscopy: 2017   PAP:  Bone density: never  Mammogram: 2017   Allergies  Allergen Reactions  . Morphine And Related Anaphylaxis    Current Outpatient Medications  Medication Sig Dispense Refill  . amlodipine-atorvastatin (CADUET) 10-10 MG tablet Take 1  tablet by mouth daily.    Marland Kitchen aspirin 81 MG chewable tablet Chew by mouth daily.    . fenofibrate (TRICOR) 145 MG tablet Take 145 mg by mouth daily.    . fluticasone (FLONASE) 50 MCG/ACT nasal spray Place 2 sprays into both nostrils daily. 16 g 3  . gabapentin (NEURONTIN) 300 MG capsule Take 300 mg by mouth 2 (two) times daily. Pt is only taking 2 capsules a day    . IBRANCE 125 MG capsule TAKE 1 CAPSULE DAILY WITH BREAKFAST. TAKE WHOLE WITH FOOD FOR 21 DAYS ON AND 7 DAYS OFF EVERY 28 DAYS 21 capsule 13  . Insulin Admin Supplies MISC Inject 24 Units into the skin daily.    . Insulin Pen Needle 31G X 5 MM MISC UAD for Sq inj for DM qd 100 each PRN  . letrozole (FEMARA) 2.5 MG tablet Take 2.5 mg by mouth  daily.    Marland Kitchen loratadine (CLARITIN) 10 MG tablet Take 10 mg by mouth daily.    Marland Kitchen losartan (COZAAR) 100 MG tablet Take 100 mg by mouth daily.    . metoprolol succinate (TOPROL-XL) 25 MG 24 hr tablet Take 1 tablet (25 mg total) by mouth daily. 90 tablet 1  . oxyCODONE-acetaminophen (PERCOCET) 10-325 MG tablet Take 1 tablet by mouth every 6 (six) hours as needed for pain. 140 tablet 0  . pantoprazole (PROTONIX) 40 MG tablet Take 1 tablet (40 mg total) by mouth daily. 90 tablet 1  . pioglitazone (ACTOS) 15 MG tablet Take 1 tablet (15 mg total) by mouth daily. 90 tablet 1  . pravastatin (PRAVACHOL) 40 MG tablet Take 40 mg by mouth daily.    . sitaGLIPtin-metformin (JANUMET) 50-1000 MG tablet Take 1 tablet by mouth daily.     No current facility-administered medications for this visit.     OBJECTIVE: Middle-aged white woman who appears stated age  63:   12/04/18 1100  BP: (!) 136/59  Resp: 18  Temp: 99.4 F (37.4 C)  SpO2: 98%     Body mass index is 38.11 kg/m.   Wt Readings from Last 3 Encounters:  12/04/18 222 lb (100.7 kg)  11/06/18 226 lb 9.6 oz (102.8 kg)  10/09/18 223 lb 6.4 oz (101.3 kg)      ECOG FS:2 - Symptomatic, <50% confined to bed  Sclerae unicteric, pupils round and equal No cervical or supraclavicular adenopathy Lungs no rales or rhonchi Heart regular rate and rhythm Abd soft, nontender, positive bowel sounds MSK no focal spinal tenderness, chronic right upper extremity lymphedema Neuro: nonfocal, well oriented, appropriate affect Breasts: Deferred   LAB RESULTS:  CMP     Component Value Date/Time   NA 139 11/06/2018 1041   NA 140 01/31/2018   K 4.8 11/06/2018 1041   CL 103 11/06/2018 1041   CO2 27 11/06/2018 1041   GLUCOSE 149 (H) 11/06/2018 1041   BUN 18 11/06/2018 1041   BUN 22 (A) 01/31/2018   CREATININE 0.85 11/06/2018 1041   CALCIUM 9.2 11/06/2018 1041   PROT 6.9 11/06/2018 1041   ALBUMIN 3.5 11/06/2018 1041   AST 16 11/06/2018 1041   ALT  15 11/06/2018 1041   ALKPHOS 91 11/06/2018 1041   BILITOT 0.5 11/06/2018 1041   GFRNONAA >60 11/06/2018 1041   GFRAA >60 11/06/2018 1041    No results found for: TOTALPROTELP, ALBUMINELP, A1GS, A2GS, BETS, BETA2SER, GAMS, MSPIKE, SPEI  No results found for: KPAFRELGTCHN, LAMBDASER, KAPLAMBRATIO  Lab Results  Component Value Date   WBC  1.7 (L) 12/04/2018   NEUTROABS 0.6 (L) 12/04/2018   HGB 11.9 (L) 12/04/2018   HCT 36.4 12/04/2018   MCV 103.7 (H) 12/04/2018   PLT 155 12/04/2018    '@LASTCHEMISTRY' @  No results found for: LABCA2  No components found for: [id number]  No results for input(s): INR in the last 168 hours.  No results found for: LABCA2  No results found for: KMM381  No results found for: RRN165  No results found for: BXU383  Lab Results  Component Value Date   CA2729 119.3 (H) 11/06/2018    No components found for: HGQUANT  No results found for: CEA1 / No results found for: CEA1   No results found for: AFPTUMOR  No results found for: [place]  No results found for: PSA1  Appointment on 12/04/2018  Component Date Value Ref Range Status  . WBC 12/04/2018 1.7* 4.0 - 10.5 K/uL Final  . RBC 12/04/2018 3.51* 3.87 - 5.11 MIL/uL Final  . Hemoglobin 12/04/2018 11.9* 12.0 - 15.0 g/dL Final  . HCT 12/04/2018 36.4  36.0 - 46.0 % Final  . MCV 12/04/2018 103.7* 80.0 - 100.0 fL Final  . MCH 12/04/2018 33.9  26.0 - 34.0 pg Final  . MCHC 12/04/2018 32.7  30.0 - 36.0 g/dL Final  . RDW 12/04/2018 14.6  11.5 - 15.5 % Final  . Platelets 12/04/2018 155  150 - 400 K/uL Final  . nRBC 12/04/2018 1.2* 0.0 - 0.2 % Final  . Neutrophils Relative % 12/04/2018 38  % Final  . Neutro Abs 12/04/2018 0.6* 1.7 - 7.7 K/uL Final  . Lymphocytes Relative 12/04/2018 21  % Final  . Lymphs Abs 12/04/2018 0.4* 0.7 - 4.0 K/uL Final  . Monocytes Relative 12/04/2018 25  % Final  . Monocytes Absolute 12/04/2018 0.4  0.1 - 1.0 K/uL Final  . Eosinophils Relative 12/04/2018 12  % Final   . Eosinophils Absolute 12/04/2018 0.2  0.0 - 0.5 K/uL Final  . Basophils Relative 12/04/2018 2  % Final  . Basophils Absolute 12/04/2018 0.0  0.0 - 0.1 K/uL Final  . Immature Granulocytes 12/04/2018 2  % Final  . Abs Immature Granulocytes 12/04/2018 0.04  0.00 - 0.07 K/uL Final   Performed at [facility], [place] [address] [place], [place] [address]    (this displays the last labs from the last 3 days)  No results found for: TOTALPROTELP, ALBUMINELP, A1GS, A2GS, BETS, BETA2SER, GAMS, MSPIKE, SPEI (this displays SPEP labs)  No results found for: KPAFRELGTCHN, LAMBDASER, KAPLAMBRATIO (kappa/lambda light chains)  No results found for: HGBA, HGBA2QUANT, HGBFQUANT, HGBSQUAN (Hemoglobinopathy evaluation)   No results found for: LDH  No results found for: IRON, TIBC, IRONPCTSAT (Iron and TIBC)  No results found for: FERRITIN  Urinalysis No results found for: COLORURINE, APPEARANCEUR, LABSPEC, PHURINE, GLUCOSEU, HGBUR, BILIRUBINUR, KETONESUR, PROTEINUR, UROBILINOGEN, NITRITE, LEUKOCYTESUR   STUDIES: No results found.   ELIGIBLE FOR AVAILABLE RESEARCH PROTOCOL: no   ASSESSMENT: 63 y.o. [place], [place] woman with stage IV breast cancer, as follows:  (1) status post right mastectomy in 2000, for a pT2 pN2, stage IIIA invasive ductal carcinoma, estrogen receptor positive, progesterone receptor not tested, HER-2 negative (0) by immunohistochemistry  (a) adjuvant chemotherapy with doxorubicin and cyclophosphamide in dose dense fashion x4 followed by paclitaxel in dose dense fashion x4  (b) adjuvant radiation: 30 doses  (c) antiestrogens: Tamoxifen for 5 years, completed 2005  (2) METASTASTIC DISEASE: 2008, involving bones, lungs, and lymph nodes  (a) CA 27-29 is informative  (  b) CT of the chest abdomen and pelvis in [facility] finds stable sclerotic metastases (T8, T10, T11, sternum, L4, L5); 0.6 cm left upper lobe lung nodule stable  (3)  prior  anti-estrogen treatments:  (a) fulvestrant--progression  (b) exemestane/everolimus--hyperlipidemia, hyperglycemia  (4) prior chemotherapy treatments:  (a) capecitabine: progression  (b) Abraxane, August 2014 through 11/18/2015: good response but stopped due to neuropathy  (c) gemcitabine 01/20/2016--with multiple interruptions secondary to infections  (5) radiation therapy:  (a) T spine and Right femur, completed 01/10/2016  (6) letrozole/ palbociclib started 07/13/2016  (7) bone treatment:  (a) denosumab/Xgeva--discontinued January 2016 due to osteonecrosis of the jaw  (8) cancer associated pain:  (a) currently on oxycodone/APAP 10/325 QID as needed  (b) PMP Aware reviewed 12/04/2018-- last script 11/06/2018  (9) restaging studies obtained November/December 2019 show  (a) mostly stable bone lesions with evidence of progression at L3-L5; no epidural tumor by total spinal MRI in November 2019  (b) no evidence of progressive lung, lymph node or liver lesions by CT scans of the chest abdomen and pelvis and bone scan December 2019  (10) palliative radiation 11/18/2018 through 12/03/2018   PLAN: Mia [name] is now 11-1/2 years out from definitive diagnosis of metastatic breast cancer.  She continues on letrozole and palbociclib which she has generally been tolerating well, except that she is neutropenic today.  This could be from the polypoid self of course but certainly the radiation is part of the reason, since she received radiation to marrow bearing bones.  As far as the palbociclib is concerned we are holding that.  She will return in 1 week for labs and depending on those results she may start at the same dose as now, or we will go to an every other day dose for the next month and then drop the dose 200 mg daily  She was only able to receive 11 of the 14 planned radiation treatments but she does say she has less low back pain.  There has been no significant change in her groin  discomfort.  The big problem of course is fatigue.  She is not anemic.  This is partly due to the radiation, partly to medications, partly to pain, and of course her chronic cancer.  I am hopeful over the next 2 weeks she will start picking up a little bit of steam  Otherwise she will see me again in April at the start of that cycle of palbociclib  She knows to call for any other issues that may develop before that visit.  Kenichi Cassada, Virgie Dad, MD  12/04/18 11:35 AM Medical Oncology and Hematology [facility] [address] [place], [place] [address] Tel. [phone]    Fax. [phone]  I, Jacqualyn Posey am acting as a Education administrator for Chauncey Cruel, MD.   I, Lurline Del MD, have reviewed the above documentation for accuracy and completeness, and I agree with the above.

## 2018-12-04 ENCOUNTER — Inpatient Hospital Stay (HOSPITAL_BASED_OUTPATIENT_CLINIC_OR_DEPARTMENT_OTHER): Payer: MEDICARE | Admitting: Oncology

## 2018-12-04 ENCOUNTER — Ambulatory Visit: Payer: MEDICARE

## 2018-12-04 ENCOUNTER — Inpatient Hospital Stay: Payer: MEDICARE | Attending: Oncology

## 2018-12-04 ENCOUNTER — Telehealth: Payer: Self-pay | Admitting: Oncology

## 2018-12-04 VITALS — BP 136/59 | Temp 99.4°F | Resp 18 | Ht 64.0 in | Wt 222.0 lb

## 2018-12-04 DIAGNOSIS — G629 Polyneuropathy, unspecified: Secondary | ICD-10-CM | POA: Diagnosis not present

## 2018-12-04 DIAGNOSIS — Z794 Long term (current) use of insulin: Secondary | ICD-10-CM | POA: Insufficient documentation

## 2018-12-04 DIAGNOSIS — Z923 Personal history of irradiation: Secondary | ICD-10-CM | POA: Insufficient documentation

## 2018-12-04 DIAGNOSIS — Z79899 Other long term (current) drug therapy: Secondary | ICD-10-CM | POA: Insufficient documentation

## 2018-12-04 DIAGNOSIS — C7951 Secondary malignant neoplasm of bone: Secondary | ICD-10-CM | POA: Diagnosis not present

## 2018-12-04 DIAGNOSIS — M879 Osteonecrosis, unspecified: Secondary | ICD-10-CM

## 2018-12-04 DIAGNOSIS — C779 Secondary and unspecified malignant neoplasm of lymph node, unspecified: Secondary | ICD-10-CM | POA: Insufficient documentation

## 2018-12-04 DIAGNOSIS — G893 Neoplasm related pain (acute) (chronic): Secondary | ICD-10-CM

## 2018-12-04 DIAGNOSIS — Z9011 Acquired absence of right breast and nipple: Secondary | ICD-10-CM

## 2018-12-04 DIAGNOSIS — Z9221 Personal history of antineoplastic chemotherapy: Secondary | ICD-10-CM | POA: Insufficient documentation

## 2018-12-04 DIAGNOSIS — D702 Other drug-induced agranulocytosis: Secondary | ICD-10-CM

## 2018-12-04 DIAGNOSIS — E669 Obesity, unspecified: Secondary | ICD-10-CM | POA: Diagnosis not present

## 2018-12-04 DIAGNOSIS — Z170 Estrogen receptor positive status [ER+]: Secondary | ICD-10-CM | POA: Insufficient documentation

## 2018-12-04 DIAGNOSIS — I700 Atherosclerosis of aorta: Secondary | ICD-10-CM | POA: Insufficient documentation

## 2018-12-04 DIAGNOSIS — Z79811 Long term (current) use of aromatase inhibitors: Secondary | ICD-10-CM | POA: Insufficient documentation

## 2018-12-04 DIAGNOSIS — R5383 Other fatigue: Secondary | ICD-10-CM | POA: Insufficient documentation

## 2018-12-04 DIAGNOSIS — Z803 Family history of malignant neoplasm of breast: Secondary | ICD-10-CM | POA: Diagnosis not present

## 2018-12-04 DIAGNOSIS — Z90722 Acquired absence of ovaries, bilateral: Secondary | ICD-10-CM | POA: Diagnosis not present

## 2018-12-04 DIAGNOSIS — C7800 Secondary malignant neoplasm of unspecified lung: Secondary | ICD-10-CM | POA: Diagnosis not present

## 2018-12-04 DIAGNOSIS — Z90710 Acquired absence of both cervix and uterus: Secondary | ICD-10-CM | POA: Insufficient documentation

## 2018-12-04 DIAGNOSIS — R109 Unspecified abdominal pain: Secondary | ICD-10-CM | POA: Diagnosis not present

## 2018-12-04 DIAGNOSIS — E785 Hyperlipidemia, unspecified: Secondary | ICD-10-CM | POA: Diagnosis not present

## 2018-12-04 DIAGNOSIS — C50911 Malignant neoplasm of unspecified site of right female breast: Secondary | ICD-10-CM | POA: Diagnosis not present

## 2018-12-04 DIAGNOSIS — I10 Essential (primary) hypertension: Secondary | ICD-10-CM | POA: Insufficient documentation

## 2018-12-04 DIAGNOSIS — C50919 Malignant neoplasm of unspecified site of unspecified female breast: Secondary | ICD-10-CM

## 2018-12-04 LAB — CBC WITH DIFFERENTIAL/PLATELET
ABS IMMATURE GRANULOCYTES: 0.04 10*3/uL (ref 0.00–0.07)
BASOS ABS: 0 10*3/uL (ref 0.0–0.1)
Basophils Relative: 2 %
EOS PCT: 12 %
Eosinophils Absolute: 0.2 10*3/uL (ref 0.0–0.5)
HCT: 36.4 % (ref 36.0–46.0)
HEMOGLOBIN: 11.9 g/dL — AB (ref 12.0–15.0)
IMMATURE GRANULOCYTES: 2 %
LYMPHS PCT: 21 %
Lymphs Abs: 0.4 10*3/uL — ABNORMAL LOW (ref 0.7–4.0)
MCH: 33.9 pg (ref 26.0–34.0)
MCHC: 32.7 g/dL (ref 30.0–36.0)
MCV: 103.7 fL — ABNORMAL HIGH (ref 80.0–100.0)
Monocytes Absolute: 0.4 10*3/uL (ref 0.1–1.0)
Monocytes Relative: 25 %
NEUTROS ABS: 0.6 10*3/uL — AB (ref 1.7–7.7)
NEUTROS PCT: 38 %
NRBC: 1.2 % — AB (ref 0.0–0.2)
Platelets: 155 10*3/uL (ref 150–400)
RBC: 3.51 MIL/uL — AB (ref 3.87–5.11)
RDW: 14.6 % (ref 11.5–15.5)
WBC: 1.7 10*3/uL — AB (ref 4.0–10.5)

## 2018-12-04 LAB — COMPREHENSIVE METABOLIC PANEL
ALT: 14 U/L (ref 0–44)
ANION GAP: 8 (ref 5–15)
AST: 18 U/L (ref 15–41)
Albumin: 3.4 g/dL — ABNORMAL LOW (ref 3.5–5.0)
Alkaline Phosphatase: 55 U/L (ref 38–126)
BUN: 10 mg/dL (ref 8–23)
CHLORIDE: 103 mmol/L (ref 98–111)
CO2: 27 mmol/L (ref 22–32)
Calcium: 9.2 mg/dL (ref 8.9–10.3)
Creatinine, Ser: 0.89 mg/dL (ref 0.44–1.00)
Glucose, Bld: 170 mg/dL — ABNORMAL HIGH (ref 70–99)
POTASSIUM: 4.4 mmol/L (ref 3.5–5.1)
SODIUM: 138 mmol/L (ref 135–145)
Total Bilirubin: 0.5 mg/dL (ref 0.3–1.2)
Total Protein: 6.8 g/dL (ref 6.5–8.1)

## 2018-12-04 MED ORDER — OXYCODONE-ACETAMINOPHEN 10-325 MG PO TABS: 1.0000 | ORAL_TABLET | Freq: Four times a day (QID) | ORAL | 0 refills | Status: DC | PRN

## 2018-12-04 NOTE — Telephone Encounter (Signed)
Gave avs and calendar ° °

## 2018-12-05 ENCOUNTER — Ambulatory Visit: Payer: MEDICARE

## 2018-12-05 LAB — CANCER ANTIGEN 27.29: CA 27.29: 109.1 U/mL — ABNORMAL HIGH (ref 0.0–38.6)

## 2018-12-08 ENCOUNTER — Encounter: Payer: Self-pay | Admitting: Radiation Oncology

## 2018-12-08 NOTE — Progress Notes (Signed)
  Radiation Oncology         [phone] ________________________________  Name: Mia [name] MRN: [id number]  Date: 12/08/2018  DOB: Oct 18, 1955  End of Treatment Note  Diagnosis:   Metastatic breast cancer to bone     Indication for treatment:  Palliative       Radiation treatment dates:   11/18/18-12/02/18  Site/dose:   1. Lumbar spine; 35 Gy in 14 fractions of 2.5 Gy           2. Pelvis; 35 Gy in 14 fractions of 2.5 Gy  Beams/energy:   1. Photon 3D; 15X        2. Photon Isodose; 15X, 10X  Narrative: The patient tolerated radiation treatment relatively well.     Patient reported improved back pain during her treatments. She denied dysuria, hematuria, vaginal/rectal bleeding and diarrhea/constipation initially but did developed some abdominal symptoms including diarrhea. She did not notice pain improvement to her groin/hip area.   Plan: The patient has completed radiation treatment. The patient will return to radiation oncology clinic for routine followup in one month. I advised them to call or return sooner if they have any questions or concerns related to their recovery or treatment.  -----------------------------------  Blair Promise, PhD, MD  This document serves as a record of services personally performed by [name], MD. It was created on his behalf by Mary-Margaret Loma Messing, a trained medical scribe. The creation of this record is based on the scribe's personal observations and the provider's statements to them. This document has been checked and approved by the attending provider.

## 2018-12-11 ENCOUNTER — Other Ambulatory Visit: Payer: Self-pay

## 2018-12-11 ENCOUNTER — Inpatient Hospital Stay: Payer: MEDICARE

## 2018-12-11 ENCOUNTER — Telehealth: Payer: Self-pay | Admitting: *Deleted

## 2018-12-11 DIAGNOSIS — C779 Secondary and unspecified malignant neoplasm of lymph node, unspecified: Secondary | ICD-10-CM | POA: Diagnosis not present

## 2018-12-11 DIAGNOSIS — C7800 Secondary malignant neoplasm of unspecified lung: Secondary | ICD-10-CM

## 2018-12-11 DIAGNOSIS — Z170 Estrogen receptor positive status [ER+]: Secondary | ICD-10-CM | POA: Diagnosis not present

## 2018-12-11 DIAGNOSIS — C50911 Malignant neoplasm of unspecified site of right female breast: Secondary | ICD-10-CM | POA: Diagnosis not present

## 2018-12-11 DIAGNOSIS — Z79811 Long term (current) use of aromatase inhibitors: Secondary | ICD-10-CM | POA: Diagnosis not present

## 2018-12-11 DIAGNOSIS — M879 Osteonecrosis, unspecified: Secondary | ICD-10-CM

## 2018-12-11 DIAGNOSIS — C7951 Secondary malignant neoplasm of bone: Secondary | ICD-10-CM

## 2018-12-11 DIAGNOSIS — C50919 Malignant neoplasm of unspecified site of unspecified female breast: Secondary | ICD-10-CM

## 2018-12-11 LAB — CBC WITH DIFFERENTIAL/PLATELET
ABS IMMATURE GRANULOCYTES: 0.79 10*3/uL — AB (ref 0.00–0.07)
Basophils Absolute: 0.1 10*3/uL (ref 0.0–0.1)
Basophils Relative: 2 %
EOS ABS: 0.4 10*3/uL (ref 0.0–0.5)
EOS PCT: 7 %
HEMATOCRIT: 37.2 % (ref 36.0–46.0)
Hemoglobin: 11.8 g/dL — ABNORMAL LOW (ref 12.0–15.0)
Immature Granulocytes: 14 %
LYMPHS ABS: 0.6 10*3/uL — AB (ref 0.7–4.0)
LYMPHS PCT: 11 %
MCH: 33.1 pg (ref 26.0–34.0)
MCHC: 31.7 g/dL (ref 30.0–36.0)
MCV: 104.5 fL — AB (ref 80.0–100.0)
MONOS PCT: 14 %
Monocytes Absolute: 0.8 10*3/uL (ref 0.1–1.0)
Neutro Abs: 3 10*3/uL (ref 1.7–7.7)
Neutrophils Relative %: 52 %
PLATELETS: 306 10*3/uL (ref 150–400)
RBC: 3.56 MIL/uL — ABNORMAL LOW (ref 3.87–5.11)
RDW: 14.4 % (ref 11.5–15.5)
WBC: 5.8 10*3/uL (ref 4.0–10.5)
nRBC: 0.5 % — ABNORMAL HIGH (ref 0.0–0.2)

## 2018-12-11 NOTE — Telephone Encounter (Signed)
Per lab review - noted ANC of 3.0 - this RN called and informed the patient to resume her Ibrance.

## 2019-01-05 ENCOUNTER — Ambulatory Visit: Payer: Self-pay | Admitting: Radiation Oncology

## 2019-01-06 ENCOUNTER — Other Ambulatory Visit: Payer: Self-pay | Admitting: Oncology

## 2019-01-06 ENCOUNTER — Telehealth: Payer: Self-pay | Admitting: Oncology

## 2019-01-06 ENCOUNTER — Ambulatory Visit: Payer: BLUE CROSS/BLUE SHIELD | Admitting: Family Medicine

## 2019-01-06 DIAGNOSIS — C50919 Malignant neoplasm of unspecified site of unspecified female breast: Secondary | ICD-10-CM

## 2019-01-06 NOTE — Telephone Encounter (Signed)
Cancelled 4/10 appts per sch msg. Scheduled for June. Called patient, no answer. Left msg informing of cancelled appt and new appt in June.

## 2019-01-09 ENCOUNTER — Ambulatory Visit: Payer: MEDICARE | Admitting: Oncology

## 2019-01-09 ENCOUNTER — Other Ambulatory Visit: Payer: MEDICARE

## 2019-01-09 ENCOUNTER — Other Ambulatory Visit: Payer: Self-pay | Admitting: Oncology

## 2019-01-09 DIAGNOSIS — C50919 Malignant neoplasm of unspecified site of unspecified female breast: Secondary | ICD-10-CM

## 2019-01-09 MED ORDER — OXYCODONE-ACETAMINOPHEN 10-325 MG PO TABS: 1.0000 | ORAL_TABLET | Freq: Four times a day (QID) | ORAL | 0 refills | Status: DC | PRN

## 2019-02-02 ENCOUNTER — Encounter: Payer: Self-pay | Admitting: Family Medicine

## 2019-02-03 ENCOUNTER — Ambulatory Visit (INDEPENDENT_AMBULATORY_CARE_PROVIDER_SITE_OTHER): Payer: MEDICARE | Admitting: Family Medicine

## 2019-02-03 ENCOUNTER — Encounter: Payer: Self-pay | Admitting: Family Medicine

## 2019-02-03 DIAGNOSIS — L03012 Cellulitis of left finger: Secondary | ICD-10-CM

## 2019-02-03 MED ORDER — CEPHALEXIN 500 MG PO CAPS: 500.0000 mg | ORAL_CAPSULE | Freq: Three times a day (TID) | ORAL | 0 refills | Status: AC

## 2019-02-03 MED ORDER — MUPIROCIN 2 % EX OINT: TOPICAL_OINTMENT | CUTANEOUS | 0 refills | Status: DC

## 2019-02-03 NOTE — Assessment & Plan Note (Signed)
Acute- she was able to get a signficant amount of pus out of it this morning by simply applying pressure so I would hate to bring her into the office for further drainage (she is immunocompromised).  Will send in rx for Keflex 500 mg twice daily x 7 days and also advised warm soaks and to apply topical antibiotic ointment after warm soak.  She will update me tomorrow. The patient indicates understanding of these issues and agrees with the plan.

## 2019-02-03 NOTE — Progress Notes (Signed)
Virtual Visit via Video   Due to the COVID-19 pandemic, this visit was completed with telemedicine (audio/video) technology to reduce patient and provider exposure as well as to preserve personal protective equipment.   I connected with Mia [name] by a video enabled telemedicine application and verified that I am speaking with the correct person using two identifiers. Location patient: Home Location provider: [facility], Office Persons participating in the virtual visit: Grae Leathers, Arnette Norris, MD   I discussed the limitations of evaluation and management by telemedicine and the availability of in person appointments. The patient expressed understanding and agreed to proceed.  Care Team   Patient Care Team: Lucille Passy, MD as PCP - General (Family Medicine) Magrinat, Virgie Dad, MD as Consulting Physician (Oncology)  Subjective:   HPI:   Left thumb pain- denies any injury.  Streak of green/yellow down nail.  There is some purulence out of tip just under the nail that green and foul in odor when she applied pressure this morning- "got quite a bit out and it feels less tender now."  She first noticed hhis on 01/31/19.  She is on Chemo and has DM.  She has not tried any rx for this.  Has never had anything like this in past.  No fever. Review of Systems  Constitutional: Negative for fever.  Respiratory: Negative.   Cardiovascular: Negative.   Gastrointestinal: Negative.   All other systems reviewed and are negative.    Patient Active Problem List   Diagnosis Date Noted  . Drug-induced neutropenia ([place]) 12/04/2018  . HTN (hypertension) 06/03/2018  . HLD (hyperlipidemia) 06/03/2018  . Metastatic breast cancer ([place]) 03/12/2018  . Bone metastases ([place]) 03/12/2018  . Lung metastases ([place]) 03/12/2018  . Osteonecrosis ([place]) 03/12/2018  . Peripheral neuropathy due to chemotherapy ([place]) 03/12/2018  . Diabetes mellitus type 2 in obese ([place]) 03/12/2018  . Hepatic  steatosis 03/12/2018  . Aortic atherosclerosis ([place]) 03/12/2018    Social History   Tobacco Use  . Smoking status: Never Smoker  . Smokeless tobacco: Never Used  Substance Use Topics  . Alcohol use: Not Currently    Current Outpatient Medications:  .  amlodipine-atorvastatin (CADUET) 10-10 MG tablet, Take 1 tablet by mouth daily., Disp: , Rfl:  .  aspirin 81 MG chewable tablet, Chew by mouth daily., Disp: , Rfl:  .  fenofibrate (TRICOR) 145 MG tablet, Take 145 mg by mouth daily., Disp: , Rfl:  .  fluticasone (FLONASE) 50 MCG/ACT nasal spray, Place 2 sprays into both nostrils daily., Disp: 16 g, Rfl: 3 .  gabapentin (NEURONTIN) 300 MG capsule, Take 300 mg by mouth 2 (two) times daily. Pt is only taking 2 capsules a day, Disp: , Rfl:  .  IBRANCE 125 MG capsule, TAKE 1 CAPSULE DAILY WITH BREAKFAST. TAKE WHOLE WITH FOOD FOR 21 DAYS ON AND 7 DAYS OFF EVERY 28 DAYS, Disp: 21 capsule, Rfl: 13 .  Insulin Admin Supplies MISC, Inject 24 Units into the skin daily., Disp: , Rfl:  .  Insulin Pen Needle 31G X 5 MM MISC, UAD for Sq inj for DM qd, Disp: 100 each, Rfl: PRN .  letrozole (FEMARA) 2.5 MG tablet, Take 2.5 mg by mouth daily., Disp: , Rfl:  .  loratadine (CLARITIN) 10 MG tablet, Take 10 mg by mouth daily., Disp: , Rfl:  .  losartan (COZAAR) 100 MG tablet, Take 100 mg by mouth daily., Disp: , Rfl:  .  metoprolol succinate (TOPROL-XL) 25 MG 24 hr tablet,  Take 1 tablet (25 mg total) by mouth daily., Disp: 90 tablet, Rfl: 1 .  oxyCODONE-acetaminophen (PERCOCET) 10-325 MG tablet, Take 1 tablet by mouth every 6 (six) hours as needed for pain., Disp: 140 tablet, Rfl: 0 .  pantoprazole (PROTONIX) 40 MG tablet, Take 1 tablet (40 mg total) by mouth daily., Disp: 90 tablet, Rfl: 1 .  pioglitazone (ACTOS) 15 MG tablet, Take 1 tablet (15 mg total) by mouth daily., Disp: 90 tablet, Rfl: 1 .  pravastatin (PRAVACHOL) 40 MG tablet, Take 40 mg by mouth daily., Disp: , Rfl:  .  sitaGLIPtin-metformin (JANUMET)  50-1000 MG tablet, Take 1 tablet by mouth daily., Disp: , Rfl:   Allergies  Allergen Reactions  . Morphine And Related Anaphylaxis    Objective:  Temp 98.8 F (37.1 C) (Oral)   LMP 10/01/2006 Comment: Total Hysterectomy  VITALS: Per patient if applicable, see vitals. GENERAL: Alert, appears well and in no acute distress. HEENT: Atraumatic, conjunctiva clear, no obvious abnormalities on inspection of external nose and ears. NECK: Normal movements of the head and neck. CARDIOPULMONARY: No increased WOB. Speaking in clear sentences. I:E ratio WNL.  MS: Moves all visible extremities without noticeable abnormality. PSYCH: Pleasant and cooperative, well-groomed. Speech normal rate and rhythm. Affect is appropriate. Insight and judgement are appropriate. Attention is focused, linear, and appropriate.  NEURO: CN grossly intact. Oriented as arrived to appointment on time with no prompting. Moves both UE equally.  SKIN: Left thumb with redness and swelling visible on lateral nail bed consistent with paronychia.  Depression screen PHQ 2/9 06/03/2018  Decreased Interest 0  Down, Depressed, Hopeless 0  PHQ - 2 Score 0    Assessment and Plan:   There are no diagnoses linked to this encounter.  Marland Kitchen COVID-19 Education: The signs and symptoms of COVID-19 were discussed with the patient and how to seek care for testing if needed. The importance of social distancing was discussed today. . Reviewed expectations re: course of current medical issues. . Discussed self-management of symptoms. . Outlined signs and symptoms indicating need for more acute intervention. . Patient verbalized understanding and all questions were answered. Marland Kitchen Health Maintenance issues including appropriate healthy diet, exercise, and smoking avoidance were discussed with patient. . See orders for this visit as documented in the electronic medical record.  Arnette Norris, MD  Records requested if needed. Time spent: 25 minutes, of  which >50% was spent in obtaining information about her symptoms, reviewing her previous labs, evaluations, and treatments, counseling her about her condition (please see the discussed topics above), and developing a plan to further investigate it; she had a number of questions which I addressed.

## 2019-02-05 ENCOUNTER — Encounter: Payer: Self-pay | Admitting: Family Medicine

## 2019-02-06 ENCOUNTER — Telehealth: Payer: Self-pay

## 2019-02-06 NOTE — Telephone Encounter (Signed)
Oral Oncology Patient Advocate Encounter  [employer] required a new prior authorization for Ibrance tablets.  I called [phone] to initiate this prior authorization.  Ibrance tablets have been approved 01/07/19-02/05/22 Approval number # [id number]  [employer] [facility] [place] Phone [phone] Fax [phone] 02/06/2019   2:09 PM

## 2019-02-25 ENCOUNTER — Encounter: Payer: Self-pay | Admitting: Oncology

## 2019-02-25 ENCOUNTER — Other Ambulatory Visit: Payer: Self-pay | Admitting: Oncology

## 2019-02-25 DIAGNOSIS — C50919 Malignant neoplasm of unspecified site of unspecified female breast: Secondary | ICD-10-CM

## 2019-02-25 MED ORDER — OXYCODONE-ACETAMINOPHEN 10-325 MG PO TABS: 1.0000 | ORAL_TABLET | Freq: Four times a day (QID) | ORAL | 0 refills | Status: DC | PRN

## 2019-03-17 ENCOUNTER — Telehealth (INDEPENDENT_AMBULATORY_CARE_PROVIDER_SITE_OTHER): Payer: MEDICARE | Admitting: Family Medicine

## 2019-03-17 VITALS — Wt 222.0 lb

## 2019-03-17 DIAGNOSIS — E1169 Type 2 diabetes mellitus with other specified complication: Secondary | ICD-10-CM | POA: Diagnosis not present

## 2019-03-17 DIAGNOSIS — I10 Essential (primary) hypertension: Secondary | ICD-10-CM | POA: Diagnosis not present

## 2019-03-17 DIAGNOSIS — E669 Obesity, unspecified: Secondary | ICD-10-CM

## 2019-03-17 DIAGNOSIS — C50919 Malignant neoplasm of unspecified site of unspecified female breast: Secondary | ICD-10-CM

## 2019-03-17 DIAGNOSIS — E785 Hyperlipidemia, unspecified: Secondary | ICD-10-CM | POA: Diagnosis not present

## 2019-03-17 MED ORDER — PIOGLITAZONE HCL 15 MG PO TABS: 15.0000 mg | ORAL_TABLET | Freq: Every day | ORAL | 1 refills | Status: DC

## 2019-03-17 MED ORDER — LOSARTAN POTASSIUM 100 MG PO TABS: 100.0000 mg | ORAL_TABLET | Freq: Every day | ORAL | 1 refills | Status: DC

## 2019-03-17 MED ORDER — INSULIN GLARGINE 100 UNITS/ML SOLOSTAR PEN: 24.0000 [IU] | PEN_INJECTOR | Freq: Every day | SUBCUTANEOUS | 1 refills | Status: DC

## 2019-03-17 MED ORDER — METOPROLOL SUCCINATE ER 25 MG PO TB24: 25.0000 mg | ORAL_TABLET | Freq: Every day | ORAL | 1 refills | Status: DC

## 2019-03-17 NOTE — Assessment & Plan Note (Signed)
>  25 minutes spent in face to face time with patient, >50% spent in counselling or coordination of care discussing hip pain, diabetes, HTN and radiation tx.  She is seeing Dr. [name] next week so I have requested that an a1c be added to the labs drawn at the cancer center tomorrow. No changes made to rxs.  I advised her to discuss her hip pain with dr. [name] as she likely needs an MRI- sounds more like it may be coming from her back. The patient indicates understanding of these issues and agrees with the plan.

## 2019-03-17 NOTE — Progress Notes (Signed)
Virtual Visit via Video   Due to the COVID-19 pandemic, this visit was completed with telemedicine (audio/video) technology to reduce patient and provider exposure as well as to preserve personal protective equipment.   I connected with Shann Medal by a video enabled telemedicine application and verified that I am speaking with the correct person using two identifiers. Location patient: Home Location provider: [facility], Office Persons participating in the virtual visit: Oria Klimas, Arnette Norris, MD   I discussed the limitations of evaluation and management by telemedicine and the availability of in person appointments. The patient expressed understanding and agreed to proceed.  Care Team   Patient Care Team: Lucille Passy, MD as PCP - General (Family Medicine) Magrinat, Virgie Dad, MD as Consulting Physician (Oncology)  Subjective:   HPI:   Follow up-  Diabetes- last saw pt on 09/02/18.  Note reviewed.  At that time, a1c had improved to 5.3 so we agreed to continue her current rxs of anumet 50-1000 mg daily along with Actose 15 mg daily.  Does not check FSBS. Denies any episodes of hypoglycemia.  Wt Readings from Last 3 Encounters:  03/17/19 222 lb (100.7 kg)  12/04/18 222 lb (100.7 kg)  11/06/18 226 lb 9.6 oz (102.8 kg)     Lab Results  Component Value Date   HGBA1C 5.3 09/02/2018   HLD- On Pravachol 40 mg daily.  Lab Results  Component Value Date   CHOL 168 06/03/2018   HDL 49.70 06/03/2018   LDLCALC 85 06/03/2018   TRIG 163.0 (H) 06/03/2018   CHOLHDL 3 06/03/2018   Lab Results  Component Value Date   ALT 14 12/04/2018   AST 18 12/04/2018   ALKPHOS 55 12/04/2018   BILITOT 0.5 12/04/2018   HTN- has been well controlled with Cozaar 100 mg daily, lopressor 25 mg daily, and Caduet 10-10 mg daily.  Lab Results  Component Value Date   CREATININE 0.89 12/04/2018   BP Readings from Last 3 Encounters:  12/04/18 (!) 136/59  11/06/18 115/76   10/09/18 131/87    Metastatic breast CA- followed by Dr. [name]. Last saw him on 12/04/18.  Note reviewed.  Radiation helped with her back pain.  It did not help with her hip and groin pain.  Most painful when she walks or raises her leg but hip extension and flexion does not hurt.  Has an appointment with Dr. [name] next week.  Review of Systems  Constitutional: Negative.   HENT: Negative.   Eyes: Negative.   Respiratory: Negative.   Cardiovascular: Negative.   Gastrointestinal: Negative.   Genitourinary: Negative.   Musculoskeletal: Positive for joint pain.  Skin: Negative.   Neurological: Negative.   Endo/Heme/Allergies: Negative.   Psychiatric/Behavioral: Negative.   All other systems reviewed and are negative.    Patient Active Problem List   Diagnosis Date Noted  . Paronychia of thumb, left 02/03/2019  . Drug-induced neutropenia ([place]) 12/04/2018  . HTN (hypertension) 06/03/2018  . HLD (hyperlipidemia) 06/03/2018  . Metastatic breast cancer ([place]) 03/12/2018  . Bone metastases ([place]) 03/12/2018  . Lung metastases ([place]) 03/12/2018  . Osteonecrosis ([place]) 03/12/2018  . Peripheral neuropathy due to chemotherapy ([place]) 03/12/2018  . Diabetes mellitus type 2 in obese ([place]) 03/12/2018  . Hepatic steatosis 03/12/2018  . Aortic atherosclerosis ([place]) 03/12/2018    Social History   Tobacco Use  . Smoking status: Never Smoker  . Smokeless tobacco: Never Used  Substance Use Topics  . Alcohol use: Not Currently  Current Outpatient Medications:  .  amlodipine-atorvastatin (CADUET) 10-10 MG tablet, Take 1 tablet by mouth daily., Disp: , Rfl:  .  aspirin 81 MG chewable tablet, Chew by mouth daily., Disp: , Rfl:  .  fenofibrate (TRICOR) 145 MG tablet, Take 145 mg by mouth daily., Disp: , Rfl:  .  fluticasone (FLONASE) 50 MCG/ACT nasal spray, Place 2 sprays into both nostrils daily., Disp: 16 g, Rfl: 3 .  gabapentin (NEURONTIN) 300 MG capsule, Take 300 mg by mouth 2 (two) times  daily. Pt is only taking 2 capsules a day, Disp: , Rfl:  .  IBRANCE 125 MG capsule, TAKE 1 CAPSULE DAILY WITH BREAKFAST. TAKE WHOLE WITH FOOD FOR 21 DAYS ON AND 7 DAYS OFF EVERY 28 DAYS, Disp: 21 capsule, Rfl: 13 .  Insulin Admin Supplies MISC, Inject 24 Units into the skin daily., Disp: , Rfl:  .  insulin glargine (LANTUS) 100 unit/mL SOPN, Inject 0.24 mLs (24 Units total) into the skin daily., Disp: 5 pen, Rfl: 1 .  Insulin Pen Needle 31G X 5 MM MISC, UAD for Sq inj for DM qd, Disp: 100 each, Rfl: PRN .  letrozole (FEMARA) 2.5 MG tablet, Take 2.5 mg by mouth daily., Disp: , Rfl:  .  loratadine (CLARITIN) 10 MG tablet, Take 10 mg by mouth daily., Disp: , Rfl:  .  losartan (COZAAR) 100 MG tablet, Take 1 tablet (100 mg total) by mouth daily., Disp: 90 tablet, Rfl: 1 .  metoprolol succinate (TOPROL-XL) 25 MG 24 hr tablet, Take 1 tablet (25 mg total) by mouth daily., Disp: 90 tablet, Rfl: 1 .  oxyCODONE-acetaminophen (PERCOCET) 10-325 MG tablet, Take 1 tablet by mouth every 6 (six) hours as needed for pain., Disp: 140 tablet, Rfl: 0 .  pantoprazole (PROTONIX) 40 MG tablet, Take 1 tablet (40 mg total) by mouth daily., Disp: 90 tablet, Rfl: 1 .  pioglitazone (ACTOS) 15 MG tablet, Take 1 tablet (15 mg total) by mouth daily., Disp: 90 tablet, Rfl: 1 .  pravastatin (PRAVACHOL) 40 MG tablet, Take 40 mg by mouth daily., Disp: , Rfl:  .  sitaGLIPtin-metformin (JANUMET) 50-1000 MG tablet, Take 1 tablet by mouth daily., Disp: , Rfl:   Allergies  Allergen Reactions  . Morphine And Related Anaphylaxis    Objective:  LMP 10/01/2006 Comment: Total Hysterectomy  BP Readings from Last 3 Encounters:  12/04/18 (!) 136/59  11/06/18 115/76  10/09/18 131/87    VITALS: Per patient if applicable, see vitals. GENERAL: Alert, appears well and in no acute distress. HEENT: Atraumatic, conjunctiva clear, no obvious abnormalities on inspection of external nose and ears. NECK: Normal movements of the head and neck.  CARDIOPULMONARY: No increased WOB. Speaking in clear sentences. I:E ratio WNL.  MS: Moves all visible extremities without noticeable abnormality. PSYCH: Pleasant and cooperative, well-groomed. Speech normal rate and rhythm. Affect is appropriate. Insight and judgement are appropriate. Attention is focused, linear, and appropriate.  NEURO: CN grossly intact. Oriented as arrived to appointment on time with no prompting. Moves both UE equally.  SKIN: No obvious lesions, wounds, erythema, or cyanosis noted on face or hands.  Depression screen PHQ 2/9 06/03/2018  Decreased Interest 0  Down, Depressed, Hopeless 0  PHQ - 2 Score 0    Assessment and Plan:   Iretta was seen today for follow-up.  Diagnoses and all orders for this visit:  Diabetes mellitus type 2 in obese [facility])  Metastatic breast cancer ([place])  Hyperlipidemia, unspecified hyperlipidemia type  Essential hypertension  Other orders -  Discontinue: insulin glargine (LANTUS) 100 unit/mL SOPN; Inject 0.24 mLs (24 Units total) into the skin daily. -     pioglitazone (ACTOS) 15 MG tablet; Take 1 tablet (15 mg total) by mouth daily. -     insulin glargine (LANTUS) 100 unit/mL SOPN; Inject 0.24 mLs (24 Units total) into the skin daily. -     metoprolol succinate (TOPROL-XL) 25 MG 24 hr tablet; Take 1 tablet (25 mg total) by mouth daily. -     losartan (COZAAR) 100 MG tablet; Take 1 tablet (100 mg total) by mouth daily.    Marland Kitchen COVID-19 Education: The signs and symptoms of COVID-19 were discussed with the patient and how to seek care for testing if needed. The importance of social distancing was discussed today. . Reviewed expectations re: course of current medical issues. . Discussed self-management of symptoms. . Outlined signs and symptoms indicating need for more acute intervention. . Patient verbalized understanding and all questions were answered. Marland Kitchen Health Maintenance issues including appropriate healthy diet, exercise, and  smoking avoidance were discussed with patient. . See orders for this visit as documented in the electronic medical record.  Arnette Norris, MD  Records requested if needed. Time spent: 25 minutes, of which >50% was spent in obtaining information about her symptoms, reviewing her previous labs, evaluations, and treatments, counseling her about her condition (please see the discussed topics above), and developing a plan to further investigate it; she had a number of questions which I addressed.

## 2019-03-18 ENCOUNTER — Other Ambulatory Visit: Payer: Self-pay | Admitting: Family Medicine

## 2019-03-20 NOTE — Telephone Encounter (Signed)
Alternative sent in on 6.16.20

## 2019-03-23 NOTE — Progress Notes (Signed)
[facility]  [phone] [phone]    ID: Mia [name] DOB: 1956-08-05  MR#: [id number]  EPP#:[id number]  Patient Care Team: Mia Passy, MD as PCP - General (Family Medicine) Magrinat, Mia Dad, MD as Consulting Physician (Oncology) OTHER MD: Mia Austria MD, Mia Sabal PA   CHIEF COMPLAINT: Estrogen receptor positive stage IV breast cancer  CURRENT TREATMENT: Letrozole, palbociclib   INTERVAL HISTORY: Mia [name] returns today for follow-up and treatment of her etrogen receptor positive stage IV breast cancer.   She continues on letrozole. She has some hot flashes, but she tolerates these well.   She also continues on palbociclib. She is in the last week of the current cycle.    There is no bone density screening on file.      We are managing her cancer associated pain.  She receives 840 oxycodone/Tylenol 10/325 on a monthly basis.  Review of PMP aware shows no other physicians prescribing and no other pharmacies being used.  Since her last visit here, she has not undergone any additional studies.    We are following her CA 27-29 Results for Mia [name], Mia [name] (MRN [id number]) as of 03/24/2019 08:58  Ref. Range 08/14/2018 10:52 09/11/2018 11:06 10/09/2018 11:07 11/06/2018 10:41 12/04/2018 10:50  CA 27.29 Latest Ref Range: 0.0 - 38.6 U/mL 90.6 (H) 102.3 (H) 127.7 (H) 119.3 (H) 109.1 (H)    REVIEW OF SYSTEMS: Mia [name] notes that the radiation has helped her back, but she does some pain in her groin area when she does straight leg raises. For pain, she takes her prescribed medication three times per day, and one extra if she will be extra active for the day. She does not have a dentist in [place], but she saw her dentist back in [place] in September, who is keeping an eye on her gums. She is taking appropriate precaution against the spread of COVID-19. Her husband does the grocery shopping and makes sure to wash down when he gets home. The  patient denies unusual headaches, visual changes, nausea, vomiting, or dizziness. There has been no unusual cough, phlegm production, or pleurisy. This been no change in bowel or bladder habits. The patient denies unexplained fatigue or unexplained weight loss, bleeding, rash, or fever. A detailed review of systems was otherwise noncontributory.    HISTORY OF CURRENT ILLNESS: From the original intake note:  We have reviewed the medical records from [place], which is the source of the information below:  Mia [name] was initially diagnosed with Stage IIIA (T2N2M0) invasive ductal carcinoma, estrogen receptor positive and HER2 negative right breast cancer in 2000. She underwent right mastectomy, with 4 positive metastatic lymph nodes. She had chemotherapy with taxol and cytoxan and fulvestrant in the past. She had radiation and completed 5 years of tamoxifen.   She was diagnosed with Stage IV disease 04/18/2005 with metastases to the bone, lung, and additional nodes. She was on capecitabine but was discontinued after rising CA 27-29 and scans.   She started anastrozole and everolimus with Delton See in April 2014. She tolerated this well, but this was discontinued in July 2014 due to abnormal blood tests, hyperlipidemia and hyperglycemia.  She began Abraxane 16m /m2 and Xgeva in August 2014 weekly x3 with 1 week off. She tolerated this treatment well. She discontinued Xgeva January 2016 due to osteonecrosis of the jaw and mouth issues. She continued abraxane alone until her last dose on 11/18/2015 due to neuropathy and disease progression with restaging studies on 11/23/2015 with a  CT chest abdomen and pelvis and one scan showing skull, sternal and femoral lesions.   She was switched to gemcitabine starting 01/20/2016 weekly x3 and 1 week off. This was discontinued on 06/15/2016 due to a port related jugular clot on the right side. She was given Lovenox for 3 months.  She was switched  to Letrozole 2.5 mg and palbociclib 125 mg starting 07/13/2016. She tolerated this well. Restaging studies with CT chest abdomen and pelvis and bone scan on 07/01/2017 showed stable/ improving lesions. CA 27-29 was stable.  During the last few months of follow up in [place], she completed restaging studies with a CT chest abdomen [facility] pelvis at [facility] on 12/17/2017 showing: A 6 mm pulmonary nodule in the left upper lobe laterally. Progression of metastatic disease to the bones at the L4 and L5 vertebral bodies. Sclerotic metastases at T8, T10, an T11, are similar to previous exams. Sclerotic metastases in the sternum stable. Hepatic steatosis. Aortic atherosclerosis.  Most recent CA-37-29 (January 2019) was 58.  Most recent hemoglobin A1c was 7.1 according to the patient  The patient's subsequent history is as detailed below.   PAST MEDICAL HISTORY: Past Medical History:  Diagnosis Date  . Breast cancer metastasized to bone ([place])   . Hyperlipidemia   . Hypertension   . Lymphedema of right arm   . Neuropathy associated with cancer ([place])   . Obesity (BMI 35.0-39.9 without comorbidity)      PAST SURGICAL HISTORY: Past Surgical History:  Procedure Laterality Date  . CATARACT EXTRACTION Left   . HYSTERECTOMY ABDOMINAL WITH SALPINGO-OOPHORECTOMY    . MODIFIED RADICAL MASTECTOMY Right       FAMILY HISTORY: No family history on file.  The patient reports she had negative genetic testing at [facility] 2014,report not available. --The patient's father died at age 25 due to a PE, and he also had a history of colon cancer diagnosed at age 2. The patient's mother died at age 54 due to diabetes and survived a cerebral aneurysm.The patient's mother also had a history of breast cancer diagnosed at age 45.  The patient has no brothers and 1 sister. The patient's sister was diagnosed with non invasive breast cancer at age 13. There was a paternal grandmother diagnosed with breast cancer at  age 78. The patient denies a family history of ovarian cancer.    GYNECOLOGIC HISTORY:  Patient's last menstrual period was 10/01/2006. Menarche: 63 years old Age at first live birth: 63 years old She is [place] P2.  She is status post hysterectomy with bilateral salpingo- oophorectomy in 2009 at about age 26.  She never used HRT.    SOCIAL HISTORY:  [place] is disabled due to her breast cancer. She used to be Surveyor, quantity for a cardiology office. Her husband, [name] is in the process of retiring. He is a Electrical engineer for a power company. The patient's daughter, [name], lives in [place] and works as a Teaching laboratory technician. The patient's second daughter, [name] is a stay at home mom and is a special needs teacher, who will soon move to [facility] [place]). The patient plans on attending [employer].     ADVANCED DIRECTIVES:    HEALTH MAINTENANCE: Social History   Tobacco Use  . Smoking status: Never Smoker  . Smokeless tobacco: Never Used  Substance Use Topics  . Alcohol use: Not Currently  . Drug use: Never    Colonoscopy: 2017   PAP:  Bone density: never  Mammogram: 2017   Allergies  Allergen Reactions  . Morphine And Related Anaphylaxis    Current Outpatient Medications  Medication Sig Dispense Refill  . amlodipine-atorvastatin (CADUET) 10-10 MG tablet Take 1 tablet by mouth daily.    Marland Kitchen aspirin 81 MG chewable tablet Chew by mouth daily.    . fenofibrate (TRICOR) 145 MG tablet Take 145 mg by mouth daily.    . fluticasone (FLONASE) 50 MCG/ACT nasal spray Place 2 sprays into both nostrils daily. 16 g 3  . gabapentin (NEURONTIN) 300 MG capsule Take 300 mg by mouth 2 (two) times daily. Pt is only taking 2 capsules a day    . IBRANCE 125 MG capsule TAKE 1 CAPSULE DAILY WITH BREAKFAST. TAKE WHOLE WITH FOOD FOR 21 DAYS ON AND 7 DAYS OFF EVERY 28 DAYS 21 capsule 13  . Insulin Admin Supplies MISC Inject 24 Units into the skin daily.    .  insulin glargine (LANTUS) 100 unit/mL SOPN Inject 0.24 mLs (24 Units total) into the skin daily. 5 pen 1  . Insulin Pen Needle 31G X 5 MM MISC UAD for Sq inj for DM qd 100 each PRN  . letrozole (FEMARA) 2.5 MG tablet Take 2.5 mg by mouth daily.    Marland Kitchen loratadine (CLARITIN) 10 MG tablet Take 10 mg by mouth daily.    Marland Kitchen losartan (COZAAR) 100 MG tablet Take 1 tablet (100 mg total) by mouth daily. 90 tablet 1  . metoprolol succinate (TOPROL-XL) 25 MG 24 hr tablet Take 1 tablet (25 mg total) by mouth daily. 90 tablet 1  . oxyCODONE-acetaminophen (PERCOCET) 10-325 MG tablet Take 1 tablet by mouth every 6 (six) hours as needed for pain. 140 tablet 0  . pantoprazole (PROTONIX) 40 MG tablet Take 1 tablet (40 mg total) by mouth daily. 90 tablet 1  . pioglitazone (ACTOS) 15 MG tablet Take 1 tablet (15 mg total) by mouth daily. 90 tablet 1  . pravastatin (PRAVACHOL) 40 MG tablet Take 40 mg by mouth daily.    . sitaGLIPtin-metformin (JANUMET) 50-1000 MG tablet Take 1 tablet by mouth daily.     No current facility-administered medications for this visit.     OBJECTIVE: Middle-aged white woman who walks with a significant limp  Vitals:   03/24/19 0855  BP: 136/71  Pulse: 88  Resp: 18  Temp: 98.5 F (36.9 C)  SpO2: 94%     Body mass index is 38.35 kg/m.   Wt Readings from Last 3 Encounters:  03/24/19 223 lb 6.4 oz (101.3 kg)  03/17/19 222 lb (100.7 kg)  12/04/18 222 lb (100.7 kg)      ECOG FS:2 - Symptomatic, <50% confined to bed  Sclerae unicteric, EOMs intact Wearing a mask No cervical or supraclavicular adenopathy Lungs no rales or rhonchi Heart regular rate and rhythm Abd soft, nontender, positive bowel sounds MSK no focal spinal tenderness, grade 2 right upper extremity lymphedema, no erythema Neuro: nonfocal, well oriented, appropriate affect Breasts: Status post right mastectomy   LAB RESULTS:  CMP     Component Value Date/Time   NA 139 03/24/2019 0833   NA 140 01/31/2018   K  4.3 03/24/2019 0833   CL 106 03/24/2019 0833   CO2 24 03/24/2019 0833   GLUCOSE 125 (H) 03/24/2019 0833   BUN 15 03/24/2019 0833   BUN 22 (A) 01/31/2018   CREATININE 1.05 (H) 03/24/2019 0833   CALCIUM 9.3 03/24/2019 0833   PROT 7.0 03/24/2019 0833   ALBUMIN 3.6 03/24/2019 3220  AST 15 03/24/2019 0833   ALT 16 03/24/2019 0833   ALKPHOS 56 03/24/2019 0833   BILITOT 0.3 03/24/2019 0833   GFRNONAA 57 (L) 03/24/2019 0833   GFRAA >60 03/24/2019 0833    No results found for: TOTALPROTELP, ALBUMINELP, A1GS, A2GS, BETS, BETA2SER, GAMS, MSPIKE, SPEI  No results found for: KPAFRELGTCHN, LAMBDASER, [facility]  Lab Results  Component Value Date   WBC 2.1 (L) 03/24/2019   NEUTROABS PENDING 03/24/2019   HGB 10.8 (L) 03/24/2019   HCT 32.5 (L) 03/24/2019   MCV 103.2 (H) 03/24/2019   PLT 258 03/24/2019    _0 @  No results found for: LABCA2  No components found for: AYTKZS010  No results for input(s): INR in the last 168 hours.  No results found for: LABCA2  No results found for: XNA355  No results found for: DDU202  No results found for: RKY706  Lab Results  Component Value Date   CA2729 109.1 (H) 12/04/2018    No components found for: HGQUANT  No results found for: CEA1 / No results found for: CEA1   No results found for: AFPTUMOR  No results found for: [place]  No results found for: PSA1  Appointment on 03/24/2019  Component Date Value Ref Range Status  . WBC 03/24/2019 2.1* 4.0 - 10.5 K/uL Final  . RBC 03/24/2019 3.15* 3.87 - 5.11 MIL/uL Final  . Hemoglobin 03/24/2019 10.8* 12.0 - 15.0 g/dL Final  . HCT 03/24/2019 32.5* 36.0 - 46.0 % Final  . MCV 03/24/2019 103.2* 80.0 - 100.0 fL Final  . MCH 03/24/2019 34.3* 26.0 - 34.0 pg Final  . MCHC 03/24/2019 33.2  30.0 - 36.0 g/dL Final  . RDW 03/24/2019 14.8  11.5 - 15.5 % Final  . Platelets 03/24/2019 258  150 - 400 K/uL Final  . nRBC 03/24/2019 0.0  0.0 - 0.2 % Final   Performed at [facility], [place] [address], [place], [place] [address]  . Neutrophils Relative % 03/24/2019 PENDING  % Incomplete  . Neutro Abs 03/24/2019 PENDING  1.7 - 7.7 K/uL Incomplete  . Band Neutrophils 03/24/2019 PENDING  % Incomplete  . Lymphocytes Relative 03/24/2019 PENDING  % Incomplete  . Lymphs Abs 03/24/2019 PENDING  0.7 - 4.0 K/uL Incomplete  . Monocytes Relative 03/24/2019 PENDING  % Incomplete  . Monocytes Absolute 03/24/2019 PENDING  0.1 - 1.0 K/uL Incomplete  . Eosinophils Relative 03/24/2019 PENDING  % Incomplete  . Eosinophils Absolute 03/24/2019 PENDING  0.0 - 0.5 K/uL Incomplete  . Basophils Relative 03/24/2019 PENDING  % Incomplete  . Basophils Absolute 03/24/2019 PENDING  0.0 - 0.1 K/uL Incomplete  . WBC Morphology 03/24/2019 PENDING   Incomplete  . RBC Morphology 03/24/2019 PENDING   Incomplete  . Smear Review 03/24/2019 PENDING   Incomplete  . Other 03/24/2019 PENDING  % Incomplete  . nRBC 03/24/2019 PENDING  0 /100 WBC Incomplete  . Metamyelocytes Relative 03/24/2019 PENDING  % Incomplete  . Myelocytes 03/24/2019 PENDING  % Incomplete  . Promyelocytes Relative 03/24/2019 PENDING  % Incomplete  . Blasts 03/24/2019 PENDING  % Incomplete  . Sodium 03/24/2019 139  135 - 145 mmol/L Final  . Potassium 03/24/2019 4.3  3.5 - 5.1 mmol/L Final  . Chloride 03/24/2019 106  98 - 111 mmol/L Final  . CO2 03/24/2019 24  22 - 32 mmol/L Final  . Glucose, Bld 03/24/2019 125* 70 - 99 mg/dL Final  . BUN 03/24/2019 15  8 - 23 mg/dL Final  . Creatinine 03/24/2019 1.05*  0.44 - 1.00 mg/dL Final  . Calcium 03/24/2019 9.3  8.9 - 10.3 mg/dL Final  . Total Protein 03/24/2019 7.0  6.5 - 8.1 g/dL Final  . Albumin 03/24/2019 3.6  3.5 - 5.0 g/dL Final  . AST 03/24/2019 15  15 - 41 U/L Final  . ALT 03/24/2019 16  0 - 44 U/L Final  . Alkaline Phosphatase 03/24/2019 56  38 - 126 U/L Final  . Total Bilirubin 03/24/2019 0.3  0.3 - 1.2 mg/dL Final  . GFR, Est Non Af Am 03/24/2019 57* >60  mL/min Final  . GFR, Est AFR Am 03/24/2019 >60  >60 mL/min Final  . Anion gap 03/24/2019 9  5 - 15 Final   Performed at [facility], [place] [address] [place], [place] [address]    (this displays the last labs from the last 3 days)  No results found for: TOTALPROTELP, ALBUMINELP, A1GS, A2GS, BETS, BETA2SER, GAMS, MSPIKE, SPEI (this displays SPEP labs)  No results found for: KPAFRELGTCHN, LAMBDASER, KAPLAMBRATIO (kappa/lambda light chains)  No results found for: HGBA, HGBA2QUANT, HGBFQUANT, HGBSQUAN (Hemoglobinopathy evaluation)   No results found for: LDH  No results found for: IRON, TIBC, IRONPCTSAT (Iron and TIBC)  No results found for: FERRITIN  Urinalysis No results found for: COLORURINE, APPEARANCEUR, LABSPEC, PHURINE, GLUCOSEU, HGBUR, BILIRUBINUR, KETONESUR, PROTEINUR, UROBILINOGEN, NITRITE, LEUKOCYTESUR   STUDIES: No results found.   ELIGIBLE FOR AVAILABLE RESEARCH PROTOCOL: no   ASSESSMENT: 63 y.o. [place], [place] woman with stage IV breast cancer, as follows:  (1) status post right mastectomy in 2000, for a pT2 pN2, stage IIIA invasive ductal carcinoma, estrogen receptor positive, progesterone receptor not tested, HER-2 negative (0) by immunohistochemistry  (a) adjuvant chemotherapy with doxorubicin and cyclophosphamide in dose dense fashion x4 followed by paclitaxel in dose dense fashion x4  (b) adjuvant radiation: 30 doses  (c) antiestrogens: Tamoxifen for 5 years, completed 2005  (2) METASTASTIC DISEASE: 2008, involving bones, lungs, and lymph nodes  (a) CA 27-29 is informative  (b) CT of the chest abdomen and pelvis in [facility] finds stable sclerotic metastases (T8, T10, T11, sternum, L4, L5); 0.6 cm left upper lobe lung nodule stable  (3)  prior anti-estrogen treatments:  (a) fulvestrant--progression  (b) exemestane/everolimus--hyperlipidemia, hyperglycemia  (4) prior chemotherapy treatments:  (a) capecitabine:  progression  (b) Abraxane, August 2014 through 11/18/2015: good response but stopped due to neuropathy  (c) gemcitabine 01/20/2016--with multiple interruptions secondary to infections  (5) radiation therapy:  (a) T spine and Right femur, completed 01/10/2016  (6) letrozole/ palbociclib started 07/13/2016  (7) bone treatment:  (a) denosumab/Xgeva--discontinued January 2016 due to osteonecrosis of the jaw  (8) cancer associated pain:  (a) currently on oxycodone/APAP 10/325 QID as needed  (b) PMP Aware reviewed 12/04/2018-- last script 11/06/2018  (9) restaging studies obtained November/December 2019 show  (a) mostly stable bone lesions with evidence of progression at L3-L5; no epidural tumor by total spinal MRI in November 2019  (b) no evidence of progressive lung, lymph node or liver lesions by CT scans of the chest abdomen and pelvis and bone scan December 2019  (10) palliative radiation 11/18/2018 through 12/02/2018 Site/dose:   1. Lumbar spine; 35 Gy in 14 fractions of 2.5 Gy                      2. Pelvis; 35 Gy in 14 fractions of 2.5 Gy   PLAN: Indy is now just about 12 years out from definitive diagnosis of  metastatic breast cancer.  She is benefiting from the letrozole/palbociclib and tolerates these well.  She also benefited from her recent palliative radiation.  The biggest problem right now is the left hip pain.  This is causing her significant ambulation problems and significant discomfort.  We are going to obtain a plain film of the left hip today.  I am also setting her up for complete restaging studies including chest CT and CT of the abdomen and pelvis hopefully within a week.  If that does not give [place] the answer on the left hip area we will obtain an MRI of that area.  I am refilling her pain medication.  She has been very stable on that  We have not been able to give her any further direct bone treatment because of the osteonecrosis problem but this can be  reconsidered depending on the restaging results  Today I wrote her a prescription for a cane that hopefully will take a little bit of the weight off the left leg.  She brought [place] a disability form which we will complete for her.  She will see [place] again in 1 month.  She knows to call for any other issue that may develop before the next visit.  Magrinat, Mia Dad, MD  03/24/19 9:18 AM Medical Oncology and Hematology [facility] [address] [place], [place] [address] Tel. [phone]    Fax. [phone]  I, Jacqualyn Posey am acting as a Education administrator for Chauncey Cruel, MD.   I, Lurline Del MD, have reviewed the above documentation for accuracy and completeness, and I agree with the above.

## 2019-03-24 ENCOUNTER — Encounter: Payer: Self-pay | Admitting: Oncology

## 2019-03-24 ENCOUNTER — Inpatient Hospital Stay: Payer: MEDICARE

## 2019-03-24 ENCOUNTER — Other Ambulatory Visit: Payer: Self-pay

## 2019-03-24 ENCOUNTER — Other Ambulatory Visit: Payer: Self-pay | Admitting: Oncology

## 2019-03-24 ENCOUNTER — Inpatient Hospital Stay: Payer: MEDICARE | Attending: Oncology | Admitting: Oncology

## 2019-03-24 ENCOUNTER — Ambulatory Visit (HOSPITAL_COMMUNITY)
Admission: RE | Admit: 2019-03-24 | Discharge: 2019-03-24 | Disposition: A | Discharge status: Home / Self Care | Payer: MEDICARE | Source: Ambulatory Visit | Attending: Oncology | Admitting: Oncology

## 2019-03-24 VITALS — BP 136/71 | HR 88 | Temp 98.5°F | Resp 18 | Ht 64.0 in | Wt 223.4 lb

## 2019-03-24 DIAGNOSIS — E785 Hyperlipidemia, unspecified: Secondary | ICD-10-CM | POA: Diagnosis not present

## 2019-03-24 DIAGNOSIS — C7800 Secondary malignant neoplasm of unspecified lung: Secondary | ICD-10-CM | POA: Diagnosis not present

## 2019-03-24 DIAGNOSIS — M879 Osteonecrosis, unspecified: Secondary | ICD-10-CM

## 2019-03-24 DIAGNOSIS — Z79811 Long term (current) use of aromatase inhibitors: Secondary | ICD-10-CM | POA: Diagnosis not present

## 2019-03-24 DIAGNOSIS — I700 Atherosclerosis of aorta: Secondary | ICD-10-CM | POA: Insufficient documentation

## 2019-03-24 DIAGNOSIS — Z803 Family history of malignant neoplasm of breast: Secondary | ICD-10-CM | POA: Diagnosis not present

## 2019-03-24 DIAGNOSIS — C50919 Malignant neoplasm of unspecified site of unspecified female breast: Secondary | ICD-10-CM

## 2019-03-24 DIAGNOSIS — G629 Polyneuropathy, unspecified: Secondary | ICD-10-CM | POA: Insufficient documentation

## 2019-03-24 DIAGNOSIS — C778 Secondary and unspecified malignant neoplasm of lymph nodes of multiple regions: Secondary | ICD-10-CM | POA: Diagnosis not present

## 2019-03-24 DIAGNOSIS — Z79899 Other long term (current) drug therapy: Secondary | ICD-10-CM | POA: Diagnosis not present

## 2019-03-24 DIAGNOSIS — C50911 Malignant neoplasm of unspecified site of right female breast: Secondary | ICD-10-CM | POA: Diagnosis not present

## 2019-03-24 DIAGNOSIS — R232 Flushing: Secondary | ICD-10-CM | POA: Diagnosis not present

## 2019-03-24 DIAGNOSIS — Z90722 Acquired absence of ovaries, bilateral: Secondary | ICD-10-CM | POA: Diagnosis not present

## 2019-03-24 DIAGNOSIS — I10 Essential (primary) hypertension: Secondary | ICD-10-CM | POA: Diagnosis not present

## 2019-03-24 DIAGNOSIS — Z170 Estrogen receptor positive status [ER+]: Secondary | ICD-10-CM | POA: Diagnosis not present

## 2019-03-24 DIAGNOSIS — C7951 Secondary malignant neoplasm of bone: Secondary | ICD-10-CM

## 2019-03-24 DIAGNOSIS — Z90710 Acquired absence of both cervix and uterus: Secondary | ICD-10-CM

## 2019-03-24 DIAGNOSIS — Z794 Long term (current) use of insulin: Secondary | ICD-10-CM | POA: Diagnosis not present

## 2019-03-24 DIAGNOSIS — E669 Obesity, unspecified: Secondary | ICD-10-CM

## 2019-03-24 DIAGNOSIS — M25552 Pain in left hip: Secondary | ICD-10-CM | POA: Diagnosis not present

## 2019-03-24 DIAGNOSIS — G893 Neoplasm related pain (acute) (chronic): Secondary | ICD-10-CM | POA: Diagnosis not present

## 2019-03-24 LAB — CMP (CANCER CENTER ONLY)
ALT: 16 U/L (ref 0–44)
AST: 15 U/L (ref 15–41)
Albumin: 3.6 g/dL (ref 3.5–5.0)
Alkaline Phosphatase: 56 U/L (ref 38–126)
Anion gap: 9 (ref 5–15)
BUN: 15 mg/dL (ref 8–23)
CO2: 24 mmol/L (ref 22–32)
Calcium: 9.3 mg/dL (ref 8.9–10.3)
Chloride: 106 mmol/L (ref 98–111)
Creatinine: 1.05 mg/dL — ABNORMAL HIGH (ref 0.44–1.00)
GFR, Est AFR Am: >60 mL/min (ref >60)
GFR, Estimated: 57 mL/min — ABNORMAL LOW (ref >60)
Glucose, Bld: 125 mg/dL — ABNORMAL HIGH (ref 70–99)
Potassium: 4.3 mmol/L (ref 3.5–5.1)
Sodium: 139 mmol/L (ref 135–145)
Total Bilirubin: 0.3 mg/dL (ref 0.3–1.2)
Total Protein: 7 g/dL (ref 6.5–8.1)

## 2019-03-24 LAB — CBC WITH DIFFERENTIAL/PLATELET
Abs Immature Granulocytes: 0.01 10*3/uL (ref 0.00–0.07)
Basophils Absolute: 0 10*3/uL (ref 0.0–0.1)
Basophils Relative: 2 %
Eosinophils Absolute: 0 10*3/uL (ref 0.0–0.5)
Eosinophils Relative: 2 %
HCT: 32.5 % — ABNORMAL LOW (ref 36.0–46.0)
Hemoglobin: 10.8 g/dL — ABNORMAL LOW (ref 12.0–15.0)
Immature Granulocytes: 1 %
Lymphocytes Relative: 17 %
Lymphs Abs: 0.4 10*3/uL — ABNORMAL LOW (ref 0.7–4.0)
MCH: 34.3 pg — ABNORMAL HIGH (ref 26.0–34.0)
MCHC: 33.2 g/dL (ref 30.0–36.0)
MCV: 103.2 fL — ABNORMAL HIGH (ref 80.0–100.0)
Monocytes Absolute: 0.2 10*3/uL (ref 0.1–1.0)
Monocytes Relative: 8 %
Neutro Abs: 1.5 10*3/uL — ABNORMAL LOW (ref 1.7–7.7)
Neutrophils Relative %: 70 %
Platelets: 258 10*3/uL (ref 150–400)
RBC: 3.15 MIL/uL — ABNORMAL LOW (ref 3.87–5.11)
RDW: 14.8 % (ref 11.5–15.5)
WBC: 2.1 10*3/uL — ABNORMAL LOW (ref 4.0–10.5)
nRBC: 0 % (ref 0.0–0.2)

## 2019-03-24 MED ORDER — LETROZOLE 2.5 MG PO TABS: 2.5000 mg | ORAL_TABLET | Freq: Every day | ORAL | 4 refills | Status: DC

## 2019-03-24 MED ORDER — PALBOCICLIB 125 MG PO CAPS: 125.0000 mg | ORAL_CAPSULE | Freq: Every day | ORAL | 13 refills | Status: DC

## 2019-03-24 MED ORDER — OXYCODONE-ACETAMINOPHEN 10-325 MG PO TABS: 1.0000 | ORAL_TABLET | Freq: Four times a day (QID) | ORAL | 0 refills | Status: DC | PRN

## 2019-03-25 ENCOUNTER — Telehealth: Payer: Self-pay | Admitting: Oncology

## 2019-03-25 LAB — CANCER ANTIGEN 27.29: CA 27.29: 105.7 U/mL — ABNORMAL HIGH (ref 0.0–38.6)

## 2019-03-25 NOTE — Telephone Encounter (Signed)
I talk with patient regarding schedule  

## 2019-03-31 ENCOUNTER — Other Ambulatory Visit: Payer: Self-pay

## 2019-03-31 ENCOUNTER — Ambulatory Visit (HOSPITAL_COMMUNITY)
Admission: RE | Admit: 2019-03-31 | Discharge: 2019-03-31 | Disposition: A | Discharge status: Home / Self Care | Payer: MEDICARE | Source: Ambulatory Visit | Attending: Oncology | Admitting: Oncology

## 2019-03-31 DIAGNOSIS — Z853 Personal history of malignant neoplasm of breast: Secondary | ICD-10-CM | POA: Diagnosis not present

## 2019-03-31 DIAGNOSIS — C7951 Secondary malignant neoplasm of bone: Secondary | ICD-10-CM

## 2019-03-31 DIAGNOSIS — C7800 Secondary malignant neoplasm of unspecified lung: Secondary | ICD-10-CM

## 2019-03-31 DIAGNOSIS — C419 Malignant neoplasm of bone and articular cartilage, unspecified: Secondary | ICD-10-CM | POA: Diagnosis not present

## 2019-03-31 DIAGNOSIS — C3490 Malignant neoplasm of unspecified part of unspecified bronchus or lung: Secondary | ICD-10-CM | POA: Diagnosis not present

## 2019-03-31 DIAGNOSIS — C50919 Malignant neoplasm of unspecified site of unspecified female breast: Secondary | ICD-10-CM | POA: Insufficient documentation

## 2019-03-31 MED ORDER — SODIUM CHLORIDE (PF) 0.9 % IJ SOLN
INTRAMUSCULAR | Status: AC
  Filled 2019-03-31: qty 50

## 2019-03-31 MED ORDER — IOHEXOL 300 MG/ML  SOLN
30.0000 mL | Freq: Once | INTRAMUSCULAR | Status: AC | PRN
  Administered 2019-03-31: 30 mL via ORAL

## 2019-03-31 MED ORDER — IOHEXOL 300 MG/ML  SOLN
100.0000 mL | Freq: Once | INTRAMUSCULAR | Status: AC | PRN
  Administered 2019-03-31: 100 mL via INTRAVENOUS

## 2019-04-02 ENCOUNTER — Other Ambulatory Visit: Payer: Self-pay | Admitting: Oncology

## 2019-04-02 DIAGNOSIS — C50919 Malignant neoplasm of unspecified site of unspecified female breast: Secondary | ICD-10-CM

## 2019-04-02 DIAGNOSIS — C7951 Secondary malignant neoplasm of bone: Secondary | ICD-10-CM

## 2019-04-02 NOTE — Progress Notes (Signed)
I called Mia [name] to give her the results of her CT scans which again showed no measurable visceral disease but significant bony disease.  The bone disease however is stable.  This does not explain why she is having more pain on the left hip area and left groin area.  She had radiation to this area recently with no relief.  We are going to obtain an MRI to look at this further and will consider referral to Dr. Maryjean Ka for pain management

## 2019-04-05 ENCOUNTER — Other Ambulatory Visit: Payer: Self-pay | Admitting: Family Medicine

## 2019-04-05 DIAGNOSIS — Z760 Encounter for issue of repeat prescription: Secondary | ICD-10-CM

## 2019-04-07 ENCOUNTER — Encounter: Payer: Self-pay | Admitting: Oncology

## 2019-04-09 ENCOUNTER — Ambulatory Visit (HOSPITAL_COMMUNITY)
Admission: RE | Admit: 2019-04-09 | Discharge: 2019-04-09 | Disposition: A | Discharge status: Home / Self Care | Payer: MEDICARE | Source: Ambulatory Visit | Attending: Oncology | Admitting: Oncology

## 2019-04-09 ENCOUNTER — Other Ambulatory Visit: Payer: Self-pay

## 2019-04-09 DIAGNOSIS — C50919 Malignant neoplasm of unspecified site of unspecified female breast: Secondary | ICD-10-CM | POA: Diagnosis not present

## 2019-04-09 DIAGNOSIS — C7951 Secondary malignant neoplasm of bone: Secondary | ICD-10-CM | POA: Diagnosis not present

## 2019-04-09 DIAGNOSIS — C801 Malignant (primary) neoplasm, unspecified: Secondary | ICD-10-CM | POA: Diagnosis not present

## 2019-04-09 MED ORDER — GADOBUTROL 1 MMOL/ML IV SOLN
10.0000 mL | Freq: Once | INTRAVENOUS | Status: AC | PRN
  Administered 2019-04-09: 10 mL via INTRAVENOUS

## 2019-04-21 ENCOUNTER — Other Ambulatory Visit: Payer: Self-pay

## 2019-04-21 ENCOUNTER — Inpatient Hospital Stay: Payer: MEDICARE | Attending: Oncology

## 2019-04-21 ENCOUNTER — Other Ambulatory Visit: Payer: Self-pay | Admitting: Oncology

## 2019-04-21 DIAGNOSIS — C7951 Secondary malignant neoplasm of bone: Secondary | ICD-10-CM

## 2019-04-21 DIAGNOSIS — C50919 Malignant neoplasm of unspecified site of unspecified female breast: Secondary | ICD-10-CM

## 2019-04-21 DIAGNOSIS — M879 Osteonecrosis, unspecified: Secondary | ICD-10-CM

## 2019-04-21 DIAGNOSIS — Z79811 Long term (current) use of aromatase inhibitors: Secondary | ICD-10-CM | POA: Diagnosis not present

## 2019-04-21 DIAGNOSIS — C50911 Malignant neoplasm of unspecified site of right female breast: Secondary | ICD-10-CM | POA: Diagnosis not present

## 2019-04-21 DIAGNOSIS — C778 Secondary and unspecified malignant neoplasm of lymph nodes of multiple regions: Secondary | ICD-10-CM | POA: Insufficient documentation

## 2019-04-21 DIAGNOSIS — Z170 Estrogen receptor positive status [ER+]: Secondary | ICD-10-CM | POA: Diagnosis not present

## 2019-04-21 DIAGNOSIS — C7800 Secondary malignant neoplasm of unspecified lung: Secondary | ICD-10-CM

## 2019-04-21 LAB — COMPREHENSIVE METABOLIC PANEL
ALT: 14 U/L (ref 0–44)
AST: 15 U/L (ref 15–41)
Albumin: 3.7 g/dL (ref 3.5–5.0)
Alkaline Phosphatase: 60 U/L (ref 38–126)
Anion gap: 10 (ref 5–15)
BUN: 20 mg/dL (ref 8–23)
CO2: 23 mmol/L (ref 22–32)
Calcium: 9.6 mg/dL (ref 8.9–10.3)
Chloride: 107 mmol/L (ref 98–111)
Creatinine, Ser: 0.97 mg/dL (ref 0.44–1.00)
GFR calc Af Amer: >60 mL/min (ref >60)
GFR calc non Af Amer: >60 mL/min (ref >60)
Glucose, Bld: 104 mg/dL — ABNORMAL HIGH (ref 70–99)
Potassium: 4.4 mmol/L (ref 3.5–5.1)
Sodium: 140 mmol/L (ref 135–145)
Total Bilirubin: 0.3 mg/dL (ref 0.3–1.2)
Total Protein: 7 g/dL (ref 6.5–8.1)

## 2019-04-21 LAB — CBC WITH DIFFERENTIAL/PLATELET
Abs Immature Granulocytes: 0.01 10*3/uL (ref 0.00–0.07)
Basophils Absolute: 0 10*3/uL (ref 0.0–0.1)
Basophils Relative: 2 %
Eosinophils Absolute: 0 10*3/uL (ref 0.0–0.5)
Eosinophils Relative: 2 %
HCT: 32.8 % — ABNORMAL LOW (ref 36.0–46.0)
Hemoglobin: 10.8 g/dL — ABNORMAL LOW (ref 12.0–15.0)
Immature Granulocytes: 1 %
Lymphocytes Relative: 17 %
Lymphs Abs: 0.3 10*3/uL — ABNORMAL LOW (ref 0.7–4.0)
MCH: 34.4 pg — ABNORMAL HIGH (ref 26.0–34.0)
MCHC: 32.9 g/dL (ref 30.0–36.0)
MCV: 104.5 fL — ABNORMAL HIGH (ref 80.0–100.0)
Monocytes Absolute: 0.2 10*3/uL (ref 0.1–1.0)
Monocytes Relative: 13 %
Neutro Abs: 1.3 10*3/uL — ABNORMAL LOW (ref 1.7–7.7)
Neutrophils Relative %: 65 %
Platelets: 221 10*3/uL (ref 150–400)
RBC: 3.14 MIL/uL — ABNORMAL LOW (ref 3.87–5.11)
RDW: 14.9 % (ref 11.5–15.5)
WBC: 1.9 10*3/uL — ABNORMAL LOW (ref 4.0–10.5)
nRBC: 0 % (ref 0.0–0.2)

## 2019-04-22 LAB — CANCER ANTIGEN 27.29: CA 27.29: 84.2 U/mL — ABNORMAL HIGH (ref 0.0–38.6)

## 2019-04-28 ENCOUNTER — Telehealth: Payer: Self-pay | Admitting: Oncology

## 2019-04-28 NOTE — Telephone Encounter (Signed)
Faxed medical records from period 10/01/18-present to [facility] @ [phone] Attn: Campbell Stall

## 2019-05-19 ENCOUNTER — Encounter: Payer: Self-pay | Admitting: Adult Health

## 2019-05-19 ENCOUNTER — Inpatient Hospital Stay: Payer: MEDICARE | Attending: Oncology

## 2019-05-19 ENCOUNTER — Other Ambulatory Visit: Payer: Self-pay

## 2019-05-19 ENCOUNTER — Inpatient Hospital Stay (HOSPITAL_BASED_OUTPATIENT_CLINIC_OR_DEPARTMENT_OTHER): Payer: MEDICARE | Admitting: Adult Health

## 2019-05-19 VITALS — BP 132/81 | HR 93 | Temp 98.5°F | Resp 18 | Ht 64.0 in | Wt 222.4 lb

## 2019-05-19 DIAGNOSIS — G893 Neoplasm related pain (acute) (chronic): Secondary | ICD-10-CM | POA: Diagnosis not present

## 2019-05-19 DIAGNOSIS — E785 Hyperlipidemia, unspecified: Secondary | ICD-10-CM | POA: Diagnosis not present

## 2019-05-19 DIAGNOSIS — Z794 Long term (current) use of insulin: Secondary | ICD-10-CM | POA: Diagnosis not present

## 2019-05-19 DIAGNOSIS — I10 Essential (primary) hypertension: Secondary | ICD-10-CM | POA: Diagnosis not present

## 2019-05-19 DIAGNOSIS — R911 Solitary pulmonary nodule: Secondary | ICD-10-CM | POA: Diagnosis not present

## 2019-05-19 DIAGNOSIS — K760 Fatty (change of) liver, not elsewhere classified: Secondary | ICD-10-CM | POA: Insufficient documentation

## 2019-05-19 DIAGNOSIS — I700 Atherosclerosis of aorta: Secondary | ICD-10-CM | POA: Insufficient documentation

## 2019-05-19 DIAGNOSIS — Z79899 Other long term (current) drug therapy: Secondary | ICD-10-CM | POA: Diagnosis not present

## 2019-05-19 DIAGNOSIS — C7951 Secondary malignant neoplasm of bone: Secondary | ICD-10-CM

## 2019-05-19 DIAGNOSIS — Z90710 Acquired absence of both cervix and uterus: Secondary | ICD-10-CM | POA: Diagnosis not present

## 2019-05-19 DIAGNOSIS — I890 Lymphedema, not elsewhere classified: Secondary | ICD-10-CM | POA: Insufficient documentation

## 2019-05-19 DIAGNOSIS — C50919 Malignant neoplasm of unspecified site of unspecified female breast: Secondary | ICD-10-CM | POA: Diagnosis not present

## 2019-05-19 DIAGNOSIS — Z79811 Long term (current) use of aromatase inhibitors: Secondary | ICD-10-CM | POA: Diagnosis not present

## 2019-05-19 DIAGNOSIS — Z90722 Acquired absence of ovaries, bilateral: Secondary | ICD-10-CM | POA: Diagnosis not present

## 2019-05-19 DIAGNOSIS — Z9011 Acquired absence of right breast and nipple: Secondary | ICD-10-CM | POA: Diagnosis not present

## 2019-05-19 DIAGNOSIS — Z803 Family history of malignant neoplasm of breast: Secondary | ICD-10-CM | POA: Diagnosis not present

## 2019-05-19 DIAGNOSIS — C7800 Secondary malignant neoplasm of unspecified lung: Secondary | ICD-10-CM

## 2019-05-19 DIAGNOSIS — Z170 Estrogen receptor positive status [ER+]: Secondary | ICD-10-CM | POA: Insufficient documentation

## 2019-05-19 DIAGNOSIS — C787 Secondary malignant neoplasm of liver and intrahepatic bile duct: Secondary | ICD-10-CM | POA: Insufficient documentation

## 2019-05-19 DIAGNOSIS — G629 Polyneuropathy, unspecified: Secondary | ICD-10-CM | POA: Diagnosis not present

## 2019-05-19 DIAGNOSIS — C50911 Malignant neoplasm of unspecified site of right female breast: Secondary | ICD-10-CM | POA: Diagnosis not present

## 2019-05-19 DIAGNOSIS — M879 Osteonecrosis, unspecified: Secondary | ICD-10-CM

## 2019-05-19 LAB — COMPREHENSIVE METABOLIC PANEL
ALT: 17 U/L (ref 0–44)
AST: 19 U/L (ref 15–41)
Albumin: 3.6 g/dL (ref 3.5–5.0)
Alkaline Phosphatase: 60 U/L (ref 38–126)
Anion gap: 11 (ref 5–15)
BUN: 15 mg/dL (ref 8–23)
CO2: 24 mmol/L (ref 22–32)
Calcium: 9.5 mg/dL (ref 8.9–10.3)
Chloride: 105 mmol/L (ref 98–111)
Creatinine, Ser: 1.01 mg/dL — ABNORMAL HIGH (ref 0.44–1.00)
GFR calc Af Amer: >60 mL/min (ref >60)
GFR calc non Af Amer: 60 mL/min — ABNORMAL LOW (ref >60)
Glucose, Bld: 107 mg/dL — ABNORMAL HIGH (ref 70–99)
Potassium: 4.8 mmol/L (ref 3.5–5.1)
Sodium: 140 mmol/L (ref 135–145)
Total Bilirubin: 0.4 mg/dL (ref 0.3–1.2)
Total Protein: 7.1 g/dL (ref 6.5–8.1)

## 2019-05-19 LAB — CBC WITH DIFFERENTIAL/PLATELET
Abs Immature Granulocytes: 0.01 10*3/uL (ref 0.00–0.07)
Basophils Absolute: 0 10*3/uL (ref 0.0–0.1)
Basophils Relative: 1 %
Eosinophils Absolute: 0 10*3/uL (ref 0.0–0.5)
Eosinophils Relative: 1 %
HCT: 33.2 % — ABNORMAL LOW (ref 36.0–46.0)
Hemoglobin: 10.8 g/dL — ABNORMAL LOW (ref 12.0–15.0)
Immature Granulocytes: 1 %
Lymphocytes Relative: 20 %
Lymphs Abs: 0.4 10*3/uL — ABNORMAL LOW (ref 0.7–4.0)
MCH: 34.6 pg — ABNORMAL HIGH (ref 26.0–34.0)
MCHC: 32.5 g/dL (ref 30.0–36.0)
MCV: 106.4 fL — ABNORMAL HIGH (ref 80.0–100.0)
Monocytes Absolute: 0.2 10*3/uL (ref 0.1–1.0)
Monocytes Relative: 9 %
Neutro Abs: 1.3 10*3/uL — ABNORMAL LOW (ref 1.7–7.7)
Neutrophils Relative %: 68 %
Platelets: 246 10*3/uL (ref 150–400)
RBC: 3.12 MIL/uL — ABNORMAL LOW (ref 3.87–5.11)
RDW: 15 % (ref 11.5–15.5)
WBC: 1.9 10*3/uL — ABNORMAL LOW (ref 4.0–10.5)
nRBC: 0 % (ref 0.0–0.2)

## 2019-05-19 MED ORDER — GABAPENTIN 300 MG PO CAPS: 300.0000 mg | ORAL_CAPSULE | Freq: Two times a day (BID) | ORAL | 3 refills | Status: DC

## 2019-05-19 MED ORDER — OXYCODONE-ACETAMINOPHEN 10-325 MG PO TABS: 1.0000 | ORAL_TABLET | Freq: Four times a day (QID) | ORAL | 0 refills | Status: DC | PRN

## 2019-05-19 NOTE — Patient Instructions (Signed)
Hip Exercises Ask your health care provider which exercises are safe for you. Do exercises exactly as told by your health care provider and adjust them as directed. It is normal to feel mild stretching, pulling, tightness, or discomfort as you do these exercises. Stop right away if you feel sudden pain or your pain gets worse. Do not begin these exercises until told by your health care provider. Stretching and range-of-motion exercises These exercises warm up your muscles and joints and improve the movement and flexibility of your hip. These exercises also help to relieve pain, numbness, and tingling. You may be asked to limit your range of motion if you had a hip replacement. Talk to your health care provider about these restrictions. Hamstrings, supine  1. Lie on your back (supine position). 2. Loop a belt or towel over the ball of your left / right foot. The ball of your foot is on the walking surface, right under your toes. 3. Straighten your left / right knee and slowly pull on the belt or towel to raise your leg until you feel a gentle stretch behind your knee (hamstring). ? Do not let your knee bend while you do this. ? Keep your other leg flat on the floor. 4. Hold this position for __________ seconds. 5. Slowly return your leg to the starting position. Repeat __________ times. Complete this exercise __________ times a day. Hip rotation  1. Lie on your back on a firm surface. 2. With your left / right hand, gently pull your left / right knee toward the shoulder that is on the same side of the body. Stop when your knee is pointing toward the ceiling. 3. Hold your left / right ankle with your other hand. 4. Keeping your knee steady, gently pull your left / right ankle toward your other shoulder until you feel a stretch in your buttocks. ? Keep your hips and shoulders firmly planted while you do this stretch. 5. Hold this position for __________ seconds. Repeat __________ times. Complete  this exercise __________ times a day. Seated stretch This exercise is sometimes called hamstrings and adductors stretch. 1. Sit on the floor with your legs stretched wide. Keep your knees straight during this exercise. 2. Keeping your head and back in a straight line, bend at your waist to reach for your left foot (position A). You should feel a stretch in your right inner thigh (adductors). 3. Hold this position for __________ seconds. Then slowly return to the upright position. 4. Keeping your head and back in a straight line, bend at your waist to reach forward (position B). You should feel a stretch behind both of your thighs and knees (hamstrings). 5. Hold this position for __________ seconds. Then slowly return to the upright position. 6. Keeping your head and back in a straight line, bend at your waist to reach for your right foot (position C). You should feel a stretch in your left inner thigh (adductors). 7. Hold this position for __________ seconds. Then slowly return to the upright position. Repeat __________ times. Complete this exercise __________ times a day. Lunge This exercise stretches the muscles of the hip (hip flexors). 1. Place your left / right knee on the floor and bend your other knee so that is directly over your ankle. You should be half-kneeling. 2. Keep good posture with your head over your shoulders. 3. Tighten your buttocks to point your tailbone downward. This will prevent your back from arching too much. 4. You should feel a  gentle stretch in the front of your left / right thigh and hip. If you do not feel a stretch, slide your other foot forward slightly and then slowly lunge forward with your chest up until your knee once again lines up over your ankle. ? Make sure your tailbone continues to point downward. 5. Hold this position for __________ seconds. 6. Slowly return to the starting position. Repeat __________ times. Complete this exercise __________ times a  day. Strengthening exercises These exercises build strength and endurance in your hip. Endurance is the ability to use your muscles for a long time, even after they get tired. Bridge This exercise strengthens the muscles of your hip (hip extensors). 1. Lie on your back on a firm surface with your knees bent and your feet flat on the floor. 2. Tighten your buttocks muscles and lift your bottom off the floor until the trunk of your body and your hips are level with your thighs. ? Do not arch your back. ? You should feel the muscles working in your buttocks and the back of your thighs. If you do not feel these muscles, slide your feet 1-2 inches (2.5-5 cm) farther away from your buttocks. 3. Hold this position for __________ seconds. 4. Slowly lower your hips to the starting position. 5. Let your muscles relax completely between repetitions. Repeat __________ times. Complete this exercise __________ times a day. Straight leg raises, side-lying This exercise strengthens the muscles that move the hip joint away from the center of the body (hip abductors). 1. Lie on your side with your left / right leg in the top position. Lie so your head, shoulder, hip, and knee line up. You may bend your bottom knee slightly to help you balance. 2. Roll your hips slightly forward, so your hips are stacked directly over each other and your left / right knee is facing forward. 3. Leading with your heel, lift your top leg 4-6 inches (10-15 cm). You should feel the muscles in your top hip lifting. ? Do not let your foot drift forward. ? Do not let your knee roll toward the ceiling. 4. Hold this position for __________ seconds. 5. Slowly return to the starting position. 6. Let your muscles relax completely between repetitions. Repeat __________ times. Complete this exercise __________ times a day. Straight leg raises, side-lying This exercise strengthen the muscles that move the hip joint toward the center of the  body (hip adductors). 1. Lie on your side with your left / right leg in the bottom position. Lie so your head, shoulder, hip, and knee line up. You may place your upper foot in front to help you balance. 2. Roll your hips slightly forward, so your hips are stacked directly over each other and your left / right knee is facing forward. 3. Tense the muscles in your inner thigh and lift your bottom leg 4-6 inches (10-15 cm). 4. Hold this position for __________ seconds. 5. Slowly return to the starting position. 6. Let your muscles relax completely between repetitions. Repeat __________ times. Complete this exercise __________ times a day. Straight leg raises, supine This exercise strengthens the muscles in the front of your thigh (quadriceps). 1. Lie on your back (supine position) with your left / right leg extended and your other knee bent. 2. Tense the muscles in the front of your left / right thigh. You should see your kneecap slide up or see increased dimpling just above your knee. 3. Keep these muscles tight as you raise your  leg 4-6 inches (10-15 cm) off the floor. Do not let your knee bend. 4. Hold this position for __________ seconds. 5. Keep these muscles tense as you lower your leg. 6. Relax the muscles slowly and completely between repetitions. Repeat __________ times. Complete this exercise __________ times a day. Hip abductors, standing This exercise strengthens the muscles that move the leg and hip joint away from the center of the body (hip abductors). 1. Tie one end of a rubber exercise band or tubing to a secure surface, such as a chair, table, or pole. 2. Loop the other end of the band or tubing around your left / right ankle. 3. Keeping your ankle with the band or tubing directly opposite the secured end, step away until there is tension in the tubing or band. Hold on to a chair, table, or pole as needed for balance. 4. Lift your left / right leg out to your side. While you do  this: ? Keep your back upright. ? Keep your shoulders over your hips. ? Keep your toes pointing forward. ? Make sure to use your hip muscles to slowly lift your leg. Do not tip your body or forcefully lift your leg. 5. Hold this position for __________ seconds. 6. Slowly return to the starting position. Repeat __________ times. Complete this exercise __________ times a day. Squats This exercise strengthens the muscles in the front of your thigh (quadriceps). 1. Stand in a door frame so your feet and knees are in line with the frame. You may place your hands on the frame for balance. 2. Slowly bend your knees and lower your hips like you are going to sit in a chair. ? Keep your lower legs in a straight-up-and-down position. ? Do not let your hips go lower than your knees. ? Do not bend your knees lower than told by your health care provider. ? If your hip pain increases, do not bend as low. 3. Hold this position for ___________ seconds. 4. Slowly push with your legs to return to standing. Do not use your hands to pull yourself to standing. Repeat __________ times. Complete this exercise __________ times a day. This information is not intended to replace advice given to you by your health care provider. Make sure you discuss any questions you have with your health care provider. Document Released: 10/05/2005 Document Revised: 07/29/2018 Document Reviewed: 07/29/2018 [employer] Patient Education  2020 [employer].

## 2019-05-19 NOTE — Progress Notes (Signed)
[place]  [phone] [phone]    ID: Mia [name] DOB: 05/04/1956  MR#: [id number]  EKC#:[id number]  Patient Care Team: Mia Passy, MD as PCP - General (Family Medicine) Magrinat, Mia Dad, MD as Consulting Physician (Oncology) OTHER MD: Mia Austria MD, Mia Sabal PA   CHIEF COMPLAINT: Estrogen receptor positive stage IV breast cancer  CURRENT TREATMENT: Letrozole, palbociclib   INTERVAL HISTORY: Mia [name] returns today for follow-up and treatment of her etrogen receptor positive stage IV breast cancer.   She continues on letrozole. She has some hot flashes, but she tolerates these well.   She also continues on palbociclib. She is currently taking her last week of her current cycle.    There is no bone density screening on file.      We are managing her cancer associated pain.  She receives #140 oxycodone/Tylenol 10/325 on a monthly basis.  Review of PMP aware shows no other physicians prescribing and no other pharmacies being used.   REVIEW OF SYSTEMS: At [facility] last appointment, she was experiencing increasing hip pain.  Imaging noted a strain, but no worsening metastatic disease.  CT scans noted continued stable bony disease.  She did not go to pain management, however the cane Dr. Jana [name] recommended has helped.  Mia [name] continues to take Percocet as prescribed.  She denies any excessive sleepiness or constipation with this.  She is also taking Gabapentin.  She is requesting these both be refilled today.  Mia [name] is otherwise doing well.  She has no fever or chills.  She is following appropriate pandemic precautions.  She is without nausea, vomiting, bowel/bladder changes.  She has no cough, shortness of breath, chest pain, or palpitations.  A detailed ROS was otherwise non contributory.    HISTORY OF CURRENT ILLNESS: From the original intake note:  We have reviewed the medical records from [place], which is  the source of the information below:  Mia [name] was initially diagnosed with Stage IIIA (T2N2M0) invasive ductal carcinoma, estrogen receptor positive and HER2 negative right breast cancer in 2000. She underwent right mastectomy, with 4 positive metastatic lymph nodes. She had chemotherapy with taxol and cytoxan and fulvestrant in the past. She had radiation and completed 5 years of tamoxifen.   She was diagnosed with Stage IV disease 04/18/2005 with metastases to the bone, lung, and additional nodes. She was on capecitabine but was discontinued after rising CA 27-29 and scans.   She started anastrozole and everolimus with Delton See in April 2014. She tolerated this well, but this was discontinued in July 2014 due to abnormal blood tests, hyperlipidemia and hyperglycemia.  She began Abraxane 134m /m2 and Xgeva in August 2014 weekly x3 with 1 week off. She tolerated this treatment well. She discontinued Xgeva January 2016 due to osteonecrosis of the jaw and mouth issues. She continued abraxane alone until her last dose on 11/18/2015 due to neuropathy and disease progression with restaging studies on 11/23/2015 with a CT chest abdomen and pelvis and one scan showing skull, sternal and femoral lesions.   She was switched to gemcitabine starting 01/20/2016 weekly x3 and 1 week off. This was discontinued on 06/15/2016 due to a port related jugular clot on the right side. She was given Lovenox for 3 months.  She was switched to Letrozole 2.5 mg and palbociclib 125 mg starting 07/13/2016. She tolerated this well. Restaging studies with CT chest abdomen and pelvis and bone scan on 07/01/2017 showed stable/ improving lesions. CA 27-29 was stable.  During the last few months of follow up in [place], she completed restaging studies with a CT chest abdomen and pelvis at [facility] on 12/17/2017 showing: A 6 mm pulmonary nodule in the left upper lobe laterally. Progression of metastatic disease to the bones at  the L4 and L5 vertebral bodies. Sclerotic metastases at T8, T10, an T11, are similar to previous exams. Sclerotic metastases in the sternum stable. Hepatic steatosis. Aortic atherosclerosis.  Most recent CA-84-29 (January 2019) was 58.  Most recent hemoglobin A1c was 7.1 according to the patient  The patient's subsequent history is as detailed below.   PAST MEDICAL HISTORY: Past Medical History:  Diagnosis Date  . Breast cancer metastasized to bone ([place])   . Hyperlipidemia   . Hypertension   . Lymphedema of right arm   . Neuropathy associated with cancer ([place])   . Obesity (BMI 35.0-39.9 without comorbidity)      PAST SURGICAL HISTORY: Past Surgical History:  Procedure Laterality Date  . CATARACT EXTRACTION Left   . HYSTERECTOMY ABDOMINAL WITH SALPINGO-OOPHORECTOMY    . MODIFIED RADICAL MASTECTOMY Right       FAMILY HISTORY: No family history on file.  The patient reports she had negative genetic testing at [facility] 2014,report not available. --The patient's father died at age 18 due to a PE, and he also had a history of colon cancer diagnosed at age 54. The patient's mother died at age 63 due to diabetes and survived a cerebral aneurysm.The patient's mother also had a history of breast cancer diagnosed at age 39.  The patient has no brothers and 1 sister. The patient's sister was diagnosed with non invasive breast cancer at age 7. There was a paternal grandmother diagnosed with breast cancer at age 41. The patient denies a family history of ovarian cancer.    GYNECOLOGIC HISTORY:  Patient's last menstrual period was 10/01/2006. Menarche: 63 years old Age at first live birth: 63 years old She is [place] P2.  She is status post hysterectomy with bilateral salpingo- oophorectomy in 2009 at about age 79.  She never used HRT.    SOCIAL HISTORY:  Mia [name] is disabled due to her breast cancer. She used to be Surveyor, quantity for a cardiology office. Her husband, Mia [name] is  in the process of retiring. He is a Electrical engineer for a power company. The patient's daughter, Mia [name], lives in [place] and works as a Teaching laboratory technician. The patient's second daughter, [name] is a stay at home mom and is a special needs teacher, who will soon move to [facility] [place]). The patient plans on attending [employer].     ADVANCED DIRECTIVES:    HEALTH MAINTENANCE: Social History   Tobacco Use  . Smoking status: Never Smoker  . Smokeless tobacco: Never Used  Substance Use Topics  . Alcohol use: Not Currently  . Drug use: Never    Colonoscopy: 2017   PAP:  Bone density: never  Mammogram: 2017   Allergies  Allergen Reactions  . Morphine And Related Anaphylaxis    Current Outpatient Medications  Medication Sig Dispense Refill  . amlodipine-atorvastatin (CADUET) 10-10 MG tablet Take 1 tablet by mouth daily.    Marland Kitchen aspirin 81 MG chewable tablet Chew by mouth daily.    . fenofibrate (TRICOR) 145 MG tablet Take 145 mg by mouth daily.    . fluticasone (FLONASE) 50 MCG/ACT nasal spray SPRAY 2 SPRAYS INTO EACH NOSTRIL EVERY DAY 48 mL 1  . gabapentin (NEURONTIN)  300 MG capsule Take 1 capsule (300 mg total) by mouth 2 (two) times daily. Pt is only taking 2 capsules a day 180 capsule 3  . IBRANCE 125 MG tablet TAKE 1 TABLET DAILY WITH BREAKFAST. TAKE WHOLE WITH FOOD FOR 21 DAYS ON AND 7 DAYS OFF EVERY 28 DAYS 63 tablet 3  . Insulin Admin Supplies MISC Inject 24 Units into the skin daily.    . insulin glargine (LANTUS) 100 unit/mL SOPN Inject 0.24 mLs (24 Units total) into the skin daily. 5 pen 1  . Insulin Pen Needle 31G X 5 MM MISC UAD for Sq inj for DM qd 100 each PRN  . letrozole (FEMARA) 2.5 MG tablet Take 1 tablet (2.5 mg total) by mouth daily. 90 tablet 4  . loratadine (CLARITIN) 10 MG tablet Take 10 mg by mouth daily.    Marland Kitchen losartan (COZAAR) 100 MG tablet Take 1 tablet (100 mg total) by mouth daily. 90 tablet 1  . metoprolol succinate  (TOPROL-XL) 25 MG 24 hr tablet Take 1 tablet (25 mg total) by mouth daily. 90 tablet 1  . oxyCODONE-acetaminophen (PERCOCET) 10-325 MG tablet Take 1 tablet by mouth every 6 (six) hours as needed for pain. 140 tablet 0  . pantoprazole (PROTONIX) 40 MG tablet Take 1 tablet (40 mg total) by mouth daily. 90 tablet 1  . pioglitazone (ACTOS) 15 MG tablet Take 1 tablet (15 mg total) by mouth daily. 90 tablet 1  . pravastatin (PRAVACHOL) 40 MG tablet Take 40 mg by mouth daily.    . sitaGLIPtin-metformin (JANUMET) 50-1000 MG tablet Take 1 tablet by mouth daily.     No current facility-administered medications for this visit.     OBJECTIVE:  Vitals:   05/19/19 1112  BP: 132/81  Pulse: 93  Resp: 18  Temp: 98.5 F (36.9 C)  SpO2: 96%     Body mass index is 38.17 kg/m.   Wt Readings from Last 3 Encounters:  05/19/19 222 lb 6.4 oz (100.9 kg)  03/24/19 223 lb 6.4 oz (101.3 kg)  03/17/19 222 lb (100.7 kg)  ECOG FS:2 - Symptomatic, <50% confined to bed GENERAL: Patient is a older, chronically ill appearing female in no acute distress, examined in chair HEENT:  Sclerae anicteric.  Oropharynx clear and moist. No ulcerations or evidence of oropharyngeal candidiasis. Neck is supple.  NODES:  No cervical, supraclavicular, or axillary lymphadenopathy palpated.  BREAST EXAM:  Deferred. LUNGS:  Clear to auscultation bilaterally.  No wheezes or rhonchi. HEART:  Regular rate and rhythm. No murmur appreciated. ABDOMEN:  Soft, nontender.  Positive, normoactive bowel sounds. No organomegaly palpated. MSK:  No focal spinal tenderness to palpation.  EXTREMITIES:  No peripheral edema.   SKIN:  Clear with no obvious rashes or skin changes. No nail dyscrasia. NEURO:  Nonfocal. Well oriented.  Appropriate affect.     LAB RESULTS:  CMP     Component Value Date/Time   NA 140 05/19/2019 1039   NA 140 01/31/2018   K 4.8 05/19/2019 1039   CL 105 05/19/2019 1039   CO2 24 05/19/2019 1039   GLUCOSE 107 (H)  05/19/2019 1039   BUN 15 05/19/2019 1039   BUN 22 (A) 01/31/2018   CREATININE 1.01 (H) 05/19/2019 1039   CREATININE 1.05 (H) 03/24/2019 0833   CALCIUM 9.5 05/19/2019 1039   PROT 7.1 05/19/2019 1039   ALBUMIN 3.6 05/19/2019 1039   AST 19 05/19/2019 1039   AST 15 03/24/2019 0833   ALT 17 05/19/2019 1039  ALT 16 03/24/2019 0833   ALKPHOS 60 05/19/2019 1039   BILITOT 0.4 05/19/2019 1039   BILITOT 0.3 03/24/2019 0833   GFRNONAA 60 (L) 05/19/2019 1039   GFRNONAA 57 (L) 03/24/2019 0833   GFRAA >60 05/19/2019 1039   GFRAA >60 03/24/2019 0833    No results found for: TOTALPROTELP, ALBUMINELP, A1GS, A2GS, BETS, BETA2SER, GAMS, MSPIKE, SPEI  No results found for: KPAFRELGTCHN, LAMBDASER, KAPLAMBRATIO  Lab Results  Component Value Date   WBC 1.9 (L) 05/19/2019   NEUTROABS 1.3 (L) 05/19/2019   HGB 10.8 (L) 05/19/2019   HCT 33.2 (L) 05/19/2019   MCV 106.4 (H) 05/19/2019   PLT 246 05/19/2019    '@LASTCHEMISTRY' @  No results found for: LABCA2  No components found for: DEYCXK481  No results for input(s): INR in the last 168 hours.  No results found for: LABCA2  No results found for: CAN199  No results found for: EHU314  No results found for: HFW263  Lab Results  Component Value Date   CA2729 84.2 (H) 04/21/2019    No components found for: HGQUANT  No results found for: CEA1 / No results found for: CEA1   No results found for: AFPTUMOR  No results found for: [place]  No results found for: PSA1  Appointment on 05/19/2019  Component Date Value Ref Range Status  . Sodium 05/19/2019 140  135 - 145 mmol/L Final  . Potassium 05/19/2019 4.8  3.5 - 5.1 mmol/L Final  . Chloride 05/19/2019 105  98 - 111 mmol/L Final  . CO2 05/19/2019 24  22 - 32 mmol/L Final  . Glucose, Bld 05/19/2019 107* 70 - 99 mg/dL Final  . BUN 05/19/2019 15  8 - 23 mg/dL Final  . Creatinine, Ser 05/19/2019 1.01* 0.44 - 1.00 mg/dL Final  . Calcium 05/19/2019 9.5  8.9 - 10.3 mg/dL Final  . Total  Protein 05/19/2019 7.1  6.5 - 8.1 g/dL Final  . Albumin 05/19/2019 3.6  3.5 - 5.0 g/dL Final  . AST 05/19/2019 19  15 - 41 U/L Final  . ALT 05/19/2019 17  0 - 44 U/L Final  . Alkaline Phosphatase 05/19/2019 60  38 - 126 U/L Final  . Total Bilirubin 05/19/2019 0.4  0.3 - 1.2 mg/dL Final  . GFR calc non Af Amer 05/19/2019 60* >60 mL/min Final  . GFR calc Af Amer 05/19/2019 >60  >60 mL/min Final  . Anion gap 05/19/2019 11  5 - 15 Final   Performed at [facility], [place] [address] [place], [place] [address]  . WBC 05/19/2019 1.9* 4.0 - 10.5 K/uL Final  . RBC 05/19/2019 3.12* 3.87 - 5.11 MIL/uL Final  . Hemoglobin 05/19/2019 10.8* 12.0 - 15.0 g/dL Final  . HCT 05/19/2019 33.2* 36.0 - 46.0 % Final  . MCV 05/19/2019 106.4* 80.0 - 100.0 fL Final  . MCH 05/19/2019 34.6* 26.0 - 34.0 pg Final  . MCHC 05/19/2019 32.5  30.0 - 36.0 g/dL Final  . RDW 05/19/2019 15.0  11.5 - 15.5 % Final  . Platelets 05/19/2019 246  150 - 400 K/uL Final  . nRBC 05/19/2019 0.0  0.0 - 0.2 % Final  . Neutrophils Relative % 05/19/2019 68  % Final  . Neutro Abs 05/19/2019 1.3* 1.7 - 7.7 K/uL Final  . Lymphocytes Relative 05/19/2019 20  % Final  . Lymphs Abs 05/19/2019 0.4* 0.7 - 4.0 K/uL Final  . Monocytes Relative 05/19/2019 9  % Final  . Monocytes Absolute 05/19/2019 0.2  0.1 -  1.0 K/uL Final  . Eosinophils Relative 05/19/2019 1  % Final  . Eosinophils Absolute 05/19/2019 0.0  0.0 - 0.5 K/uL Final  . Basophils Relative 05/19/2019 1  % Final  . Basophils Absolute 05/19/2019 0.0  0.0 - 0.1 K/uL Final  . Immature Granulocytes 05/19/2019 1  % Final  . Abs Immature Granulocytes 05/19/2019 0.01  0.00 - 0.07 K/uL Final   Performed at [facility], [place] [address], [place], [place] [address]    (this displays the last labs from the last 3 days)  No results found for: TOTALPROTELP, ALBUMINELP, A1GS, A2GS, BETS, BETA2SER, GAMS, MSPIKE, SPEI (this displays SPEP labs)  No  results found for: KPAFRELGTCHN, LAMBDASER, KAPLAMBRATIO (kappa/lambda light chains)  No results found for: HGBA, HGBA2QUANT, HGBFQUANT, HGBSQUAN (Hemoglobinopathy evaluation)   No results found for: LDH  No results found for: IRON, TIBC, IRONPCTSAT (Iron and TIBC)  No results found for: FERRITIN  Urinalysis No results found for: COLORURINE, APPEARANCEUR, LABSPEC, PHURINE, GLUCOSEU, HGBUR, BILIRUBINUR, KETONESUR, PROTEINUR, UROBILINOGEN, NITRITE, LEUKOCYTESUR   STUDIES: No results found.   ELIGIBLE FOR AVAILABLE RESEARCH PROTOCOL: no   ASSESSMENT: 63 y.o. [place], [place] woman with stage IV breast cancer, as follows:  (1) status post right mastectomy in 2000, for a pT2 pN2, stage IIIA invasive ductal carcinoma, estrogen receptor positive, progesterone receptor not tested, HER-2 negative (0) by immunohistochemistry  (a) adjuvant chemotherapy with doxorubicin and cyclophosphamide in dose dense fashion x4 followed by paclitaxel in dose dense fashion x4  (b) adjuvant radiation: 30 doses  (c) antiestrogens: Tamoxifen for 5 years, completed 2005  (2) METASTASTIC DISEASE: 2008, involving bones, lungs, and lymph nodes  (a) CA 27-29 is informative  (b) CT of the chest abdomen and pelvis in [facility] finds stable sclerotic metastases (T8, T10, T11, sternum, L4, L5); 0.6 cm left upper lobe lung nodule stable  (3)  prior anti-estrogen treatments:  (a) fulvestrant--progression  (b) exemestane/everolimus--hyperlipidemia, hyperglycemia  (4) prior chemotherapy treatments:  (a) capecitabine: progression  (b) Abraxane, August 2014 through 11/18/2015: good response but stopped due to neuropathy  (c) gemcitabine 01/20/2016--with multiple interruptions secondary to infections  (5) radiation therapy:  (a) T spine and Right femur, completed 01/10/2016  (6) letrozole/ palbociclib started 07/13/2016  (7) bone treatment:  (a) denosumab/Xgeva--discontinued January 2016 due to  osteonecrosis of the jaw  (8) cancer associated pain:  (a) currently on oxycodone/APAP 10/325 QID as needed  (b) PMP Aware reviewed 12/04/2018-- last script 11/06/2018  (9) restaging studies obtained November/December 2019 show  (a) mostly stable bone lesions with evidence of progression at L3-L5; no epidural tumor by total spinal MRI in November 2019  (b) no evidence of progressive lung, lymph node or liver lesions by CT scans of the chest abdomen and pelvis and bone scan December 2019  (10) palliative radiation 11/18/2018 through 12/02/2018 Site/dose:   1. Lumbar spine; 35 Gy in 14 fractions of 2.5 Gy                      2. Pelvis; 35 Gy in 14 fractions of 2.5 Gy   PLAN: Mia [name] is doing well today.  She continues on Letrozole and Palbociclib with good tolerance.  She has no signs of progression today.    Mia [name] and I discussed her pain management.  Her pain is well controlled with percocet and gabapentin.  PMP aware was reviewed and script requests are appropriate.  She is tolerating the medications with control of pain, but no  constipation, or increased drowsiness.  She will continue this, and I sent in refills of both today.    Mia [name] and I reviewed her hip pain.  I gave her some exercises to try and see if they help.  I also recommended she take walks every hour or so around her house briefly, to keep her muscles working and moving despite her not being able to go out due to COVID 19 and the heat.    Mia [name] will return in 1 month for labs and in 2 months for f/u with Dr. Jana [name].  She was recommended to continue with the appropriate pandemic precautions. She knows to call for any questions that may arise between now and her next appointment.  We are happy to see her sooner if needed.  A total of (30) minutes of face-to-face time was spent with this patient with greater than 50% of that time in counseling and care-coordination.   Wilber Bihari, NP  05/19/19 11:52 AM  Medical Oncology and Hematology [facility] [place], [place] [address] Tel. [phone]    Fax. [phone]

## 2019-05-20 LAB — CANCER ANTIGEN 27.29: CA 27.29: 92.2 U/mL — ABNORMAL HIGH (ref 0.0–38.6)

## 2019-06-12 ENCOUNTER — Encounter: Payer: Self-pay | Admitting: Family Medicine

## 2019-06-16 ENCOUNTER — Other Ambulatory Visit: Payer: Self-pay

## 2019-06-16 ENCOUNTER — Inpatient Hospital Stay: Payer: MEDICARE | Attending: Oncology

## 2019-06-16 DIAGNOSIS — Z170 Estrogen receptor positive status [ER+]: Secondary | ICD-10-CM | POA: Insufficient documentation

## 2019-06-16 DIAGNOSIS — E1169 Type 2 diabetes mellitus with other specified complication: Secondary | ICD-10-CM

## 2019-06-16 DIAGNOSIS — M879 Osteonecrosis, unspecified: Secondary | ICD-10-CM

## 2019-06-16 DIAGNOSIS — C50919 Malignant neoplasm of unspecified site of unspecified female breast: Secondary | ICD-10-CM

## 2019-06-16 DIAGNOSIS — C7800 Secondary malignant neoplasm of unspecified lung: Secondary | ICD-10-CM

## 2019-06-16 DIAGNOSIS — C50911 Malignant neoplasm of unspecified site of right female breast: Secondary | ICD-10-CM | POA: Diagnosis not present

## 2019-06-16 DIAGNOSIS — C7951 Secondary malignant neoplasm of bone: Secondary | ICD-10-CM

## 2019-06-16 LAB — CBC WITH DIFFERENTIAL/PLATELET
Abs Immature Granulocytes: 0.02 10*3/uL (ref 0.00–0.07)
Basophils Absolute: 0 10*3/uL (ref 0.0–0.1)
Basophils Relative: 2 %
Eosinophils Absolute: 0 10*3/uL (ref 0.0–0.5)
Eosinophils Relative: 2 %
HCT: 32.2 % — ABNORMAL LOW (ref 36.0–46.0)
Hemoglobin: 10.3 g/dL — ABNORMAL LOW (ref 12.0–15.0)
Immature Granulocytes: 1 %
Lymphocytes Relative: 18 %
Lymphs Abs: 0.4 10*3/uL — ABNORMAL LOW (ref 0.7–4.0)
MCH: 34.2 pg — ABNORMAL HIGH (ref 26.0–34.0)
MCHC: 32 g/dL (ref 30.0–36.0)
MCV: 107 fL — ABNORMAL HIGH (ref 80.0–100.0)
Monocytes Absolute: 0.2 10*3/uL (ref 0.1–1.0)
Monocytes Relative: 9 %
Neutro Abs: 1.4 10*3/uL — ABNORMAL LOW (ref 1.7–7.7)
Neutrophils Relative %: 68 %
Platelets: 253 10*3/uL (ref 150–400)
RBC: 3.01 MIL/uL — ABNORMAL LOW (ref 3.87–5.11)
RDW: 15.3 % (ref 11.5–15.5)
WBC: 2.1 10*3/uL — ABNORMAL LOW (ref 4.0–10.5)
nRBC: 0 % (ref 0.0–0.2)

## 2019-06-16 LAB — COMPREHENSIVE METABOLIC PANEL
ALT: 14 U/L (ref 0–44)
AST: 17 U/L (ref 15–41)
Albumin: 3.7 g/dL (ref 3.5–5.0)
Alkaline Phosphatase: 62 U/L (ref 38–126)
Anion gap: 8 (ref 5–15)
BUN: 18 mg/dL (ref 8–23)
CO2: 26 mmol/L (ref 22–32)
Calcium: 9.4 mg/dL (ref 8.9–10.3)
Chloride: 104 mmol/L (ref 98–111)
Creatinine, Ser: 1.04 mg/dL — ABNORMAL HIGH (ref 0.44–1.00)
GFR calc Af Amer: >60 mL/min (ref >60)
GFR calc non Af Amer: 58 mL/min — ABNORMAL LOW (ref >60)
Glucose, Bld: 96 mg/dL (ref 70–99)
Potassium: 4.6 mmol/L (ref 3.5–5.1)
Sodium: 138 mmol/L (ref 135–145)
Total Bilirubin: 0.4 mg/dL (ref 0.3–1.2)
Total Protein: 6.8 g/dL (ref 6.5–8.1)

## 2019-06-17 ENCOUNTER — Telehealth: Payer: Self-pay

## 2019-06-17 LAB — CANCER ANTIGEN 27.29: CA 27.29: 84.9 U/mL — ABNORMAL HIGH (ref 0.0–38.6)

## 2019-06-17 NOTE — Telephone Encounter (Signed)

## 2019-06-18 ENCOUNTER — Other Ambulatory Visit: Payer: Self-pay

## 2019-06-18 ENCOUNTER — Encounter: Payer: Self-pay | Admitting: Family Medicine

## 2019-06-18 ENCOUNTER — Other Ambulatory Visit (INDEPENDENT_AMBULATORY_CARE_PROVIDER_SITE_OTHER): Payer: MEDICARE

## 2019-06-18 DIAGNOSIS — E1169 Type 2 diabetes mellitus with other specified complication: Secondary | ICD-10-CM | POA: Diagnosis not present

## 2019-06-18 DIAGNOSIS — E669 Obesity, unspecified: Secondary | ICD-10-CM | POA: Diagnosis not present

## 2019-06-18 LAB — HEMOGLOBIN A1C: Hgb A1c MFr Bld: 6.1 % (ref 4.6–6.5)

## 2019-06-18 MED ORDER — SITAGLIPTIN PHOS-METFORMIN HCL 50-1000 MG PO TABS: 1.0000 | ORAL_TABLET | Freq: Every day | ORAL | 1 refills | Status: DC

## 2019-06-30 ENCOUNTER — Encounter: Payer: Self-pay | Admitting: Oncology

## 2019-07-02 ENCOUNTER — Other Ambulatory Visit: Payer: Self-pay | Admitting: Oncology

## 2019-07-02 DIAGNOSIS — C50919 Malignant neoplasm of unspecified site of unspecified female breast: Secondary | ICD-10-CM

## 2019-07-02 MED ORDER — OXYCODONE-ACETAMINOPHEN 10-325 MG PO TABS: 1.0000 | ORAL_TABLET | Freq: Four times a day (QID) | ORAL | 0 refills | Status: DC | PRN

## 2019-07-07 ENCOUNTER — Encounter: Payer: Self-pay | Admitting: Family Medicine

## 2019-07-07 ENCOUNTER — Other Ambulatory Visit: Payer: Self-pay

## 2019-07-07 MED ORDER — PANTOPRAZOLE SODIUM 40 MG PO TBEC: 40.0000 mg | DELAYED_RELEASE_TABLET | Freq: Every day | ORAL | 3 refills | Status: DC

## 2019-07-14 ENCOUNTER — Other Ambulatory Visit: Payer: Self-pay

## 2019-07-14 ENCOUNTER — Inpatient Hospital Stay: Payer: MEDICARE | Attending: Oncology

## 2019-07-14 ENCOUNTER — Inpatient Hospital Stay (HOSPITAL_BASED_OUTPATIENT_CLINIC_OR_DEPARTMENT_OTHER): Payer: MEDICARE | Admitting: Oncology

## 2019-07-14 VITALS — BP 121/82 | HR 78 | Temp 98.9°F | Resp 18 | Ht 64.0 in | Wt 224.9 lb

## 2019-07-14 DIAGNOSIS — Z23 Encounter for immunization: Secondary | ICD-10-CM

## 2019-07-14 DIAGNOSIS — Z90722 Acquired absence of ovaries, bilateral: Secondary | ICD-10-CM | POA: Insufficient documentation

## 2019-07-14 DIAGNOSIS — C50919 Malignant neoplasm of unspecified site of unspecified female breast: Secondary | ICD-10-CM

## 2019-07-14 DIAGNOSIS — G620 Drug-induced polyneuropathy: Secondary | ICD-10-CM

## 2019-07-14 DIAGNOSIS — Z79899 Other long term (current) drug therapy: Secondary | ICD-10-CM | POA: Insufficient documentation

## 2019-07-14 DIAGNOSIS — C7951 Secondary malignant neoplasm of bone: Secondary | ICD-10-CM

## 2019-07-14 DIAGNOSIS — Z170 Estrogen receptor positive status [ER+]: Secondary | ICD-10-CM | POA: Diagnosis not present

## 2019-07-14 DIAGNOSIS — C50911 Malignant neoplasm of unspecified site of right female breast: Secondary | ICD-10-CM | POA: Insufficient documentation

## 2019-07-14 DIAGNOSIS — Z9079 Acquired absence of other genital organ(s): Secondary | ICD-10-CM | POA: Insufficient documentation

## 2019-07-14 DIAGNOSIS — Z803 Family history of malignant neoplasm of breast: Secondary | ICD-10-CM | POA: Insufficient documentation

## 2019-07-14 DIAGNOSIS — Z7982 Long term (current) use of aspirin: Secondary | ICD-10-CM | POA: Insufficient documentation

## 2019-07-14 DIAGNOSIS — Z794 Long term (current) use of insulin: Secondary | ICD-10-CM | POA: Diagnosis not present

## 2019-07-14 DIAGNOSIS — Z923 Personal history of irradiation: Secondary | ICD-10-CM | POA: Insufficient documentation

## 2019-07-14 DIAGNOSIS — I10 Essential (primary) hypertension: Secondary | ICD-10-CM | POA: Insufficient documentation

## 2019-07-14 DIAGNOSIS — Z833 Family history of diabetes mellitus: Secondary | ICD-10-CM | POA: Insufficient documentation

## 2019-07-14 DIAGNOSIS — Z79811 Long term (current) use of aromatase inhibitors: Secondary | ICD-10-CM | POA: Insufficient documentation

## 2019-07-14 DIAGNOSIS — K760 Fatty (change of) liver, not elsewhere classified: Secondary | ICD-10-CM

## 2019-07-14 DIAGNOSIS — M879 Osteonecrosis, unspecified: Secondary | ICD-10-CM

## 2019-07-14 DIAGNOSIS — C7800 Secondary malignant neoplasm of unspecified lung: Secondary | ICD-10-CM | POA: Diagnosis not present

## 2019-07-14 DIAGNOSIS — G893 Neoplasm related pain (acute) (chronic): Secondary | ICD-10-CM | POA: Diagnosis not present

## 2019-07-14 DIAGNOSIS — Z9221 Personal history of antineoplastic chemotherapy: Secondary | ICD-10-CM | POA: Diagnosis not present

## 2019-07-14 DIAGNOSIS — Z90710 Acquired absence of both cervix and uterus: Secondary | ICD-10-CM | POA: Insufficient documentation

## 2019-07-14 DIAGNOSIS — Z9011 Acquired absence of right breast and nipple: Secondary | ICD-10-CM | POA: Diagnosis not present

## 2019-07-14 DIAGNOSIS — T451X5A Adverse effect of antineoplastic and immunosuppressive drugs, initial encounter: Secondary | ICD-10-CM

## 2019-07-14 LAB — COMPREHENSIVE METABOLIC PANEL
ALT: 15 U/L (ref 0–44)
AST: 16 U/L (ref 15–41)
Albumin: 3.7 g/dL (ref 3.5–5.0)
Alkaline Phosphatase: 60 U/L (ref 38–126)
Anion gap: 11 (ref 5–15)
BUN: 12 mg/dL (ref 8–23)
CO2: 24 mmol/L (ref 22–32)
Calcium: 9.5 mg/dL (ref 8.9–10.3)
Chloride: 107 mmol/L (ref 98–111)
Creatinine, Ser: 1.03 mg/dL — ABNORMAL HIGH (ref 0.44–1.00)
GFR calc Af Amer: >60 mL/min (ref >60)
GFR calc non Af Amer: 58 mL/min — ABNORMAL LOW (ref >60)
Glucose, Bld: 119 mg/dL — ABNORMAL HIGH (ref 70–99)
Potassium: 4.7 mmol/L (ref 3.5–5.1)
Sodium: 142 mmol/L (ref 135–145)
Total Bilirubin: 0.3 mg/dL (ref 0.3–1.2)
Total Protein: 7.1 g/dL (ref 6.5–8.1)

## 2019-07-14 LAB — CBC WITH DIFFERENTIAL/PLATELET
Abs Immature Granulocytes: 0.02 10*3/uL (ref 0.00–0.07)
Basophils Absolute: 0 10*3/uL (ref 0.0–0.1)
Basophils Relative: 1 %
Eosinophils Absolute: 0 10*3/uL (ref 0.0–0.5)
Eosinophils Relative: 1 %
HCT: 34.4 % — ABNORMAL LOW (ref 36.0–46.0)
Hemoglobin: 11.3 g/dL — ABNORMAL LOW (ref 12.0–15.0)
Immature Granulocytes: 1 %
Lymphocytes Relative: 17 %
Lymphs Abs: 0.4 10*3/uL — ABNORMAL LOW (ref 0.7–4.0)
MCH: 35.1 pg — ABNORMAL HIGH (ref 26.0–34.0)
MCHC: 32.8 g/dL (ref 30.0–36.0)
MCV: 106.8 fL — ABNORMAL HIGH (ref 80.0–100.0)
Monocytes Absolute: 0.2 10*3/uL (ref 0.1–1.0)
Monocytes Relative: 9 %
Neutro Abs: 1.7 10*3/uL (ref 1.7–7.7)
Neutrophils Relative %: 71 %
Platelets: 251 10*3/uL (ref 150–400)
RBC: 3.22 MIL/uL — ABNORMAL LOW (ref 3.87–5.11)
RDW: 14.6 % (ref 11.5–15.5)
WBC: 2.3 10*3/uL — ABNORMAL LOW (ref 4.0–10.5)
nRBC: 0 % (ref 0.0–0.2)

## 2019-07-14 MED ORDER — INFLUENZA VAC SPLIT QUAD 0.5 ML IM SUSY
PREFILLED_SYRINGE | INTRAMUSCULAR | Status: AC
  Filled 2019-07-14: qty 0.5

## 2019-07-14 MED ORDER — INFLUENZA VAC SPLIT QUAD 0.5 ML IM SUSY
0.5000 mL | PREFILLED_SYRINGE | Freq: Once | INTRAMUSCULAR | Status: AC
  Administered 2019-07-14: 12:00:00 0.5 mL via INTRAMUSCULAR

## 2019-07-14 NOTE — Progress Notes (Signed)
[facility]  [phone] [phone]    ID: Mia [name] DOB: 1955-12-21  MR#: [id number]  DJT#:[id number]  Patient Care Team: Mia Passy, MD as PCP - General (Family Medicine) Mia [name], Mia Dad, MD as Consulting Physician (Oncology) OTHER MD: Mia Austria MD, Mia Sabal PA   CHIEF COMPLAINT: Estrogen receptor positive stage IV breast cancer  CURRENT TREATMENT: Letrozole, palbociclib   INTERVAL HISTORY: Mia [name] returns today for follow-up and treatment of her etrogen receptor positive stage IV breast cancer. She was last seen here on 05/19/2019.   She continues on letrozole.  She has some hot flashes particularly at night, but this does not bother her sufficiently for her to want to take additional medications for it.  She is already on gabapentin for her pain.  She also continues on palbociclib.  She has some fatigue but of course this could be from the cancer and not the medication.  She does not have significant nausea issues.  There is no bone density screening on file  We are following her CA 27-29. Results for Mia [name], Mia [name] (MRN [id number]) as of 07/14/2019 11:12  Ref. Range 12/04/2018 10:50 03/24/2019 08:33 04/21/2019 11:24 05/19/2019 10:39 06/16/2019 11:03  CA 27.29 Latest Ref Range: 0.0 - 38.6 U/mL 109.1 (H) 105.7 (H) 84.2 (H) 92.2 (H) 84.9 (H)    Lab Results  Component Value Date   CA2729 84.9 (H) 06/16/2019   CA2729 92.2 (H) 05/19/2019   CA2729 84.2 (H) 04/21/2019   CA2729 105.7 (H) 03/24/2019   CA2729 109.1 (H) 12/04/2018     REVIEW OF SYSTEMS: Mia [name] is doing better since she got a cane.  It takes some of the weight off her hips when she walks.  When she sits she has no pain.  She would like to [place] to the mountains this weekend but is afraid I think appropriately so to stay out of bed on breakfast or air B&B given the current pandemic problem.  She tells me her diabetes is well controlled and her most recent A1c  was 6.1.  She takes Percocet 4 times a day.  She is not constipated from this.  A detailed review of systems today was otherwise stable.   HISTORY OF CURRENT ILLNESS: From the original intake note:  We have reviewed the medical records from [place], which is the source of the information below:  Mia [name] was initially diagnosed with Stage IIIA (T2N2M0) invasive ductal carcinoma, estrogen receptor positive and HER2 negative right breast cancer in 2000. She underwent right mastectomy, with 4 positive metastatic lymph nodes. She had chemotherapy with taxol and cytoxan and fulvestrant in the past. She had radiation and completed 5 years of tamoxifen.   She was diagnosed with Stage IV disease 04/18/2005 with metastases to the bone, lung, and additional nodes. She was on capecitabine but was discontinued after rising CA 27-29 and scans.   She started anastrozole and everolimus with Delton See in April 2014. She tolerated this well, but this was discontinued in July 2014 due to abnormal blood tests, hyperlipidemia and hyperglycemia.  She began Abraxane 124m /m2 and Xgeva in August 2014 weekly x3 with 1 week off. She tolerated this treatment well. She discontinued Xgeva January 2016 due to osteonecrosis of the jaw and mouth issues. She continued abraxane alone until her last dose on 11/18/2015 due to neuropathy and disease progression with restaging studies on 11/23/2015 with a CT chest abdomen and pelvis and one scan showing skull, sternal and femoral lesions.  She was switched to gemcitabine starting 01/20/2016 weekly x3 and 1 week off. This was discontinued on 06/15/2016 due to a port related jugular clot on the right side. She was given Lovenox for 3 months.  She was switched to Letrozole 2.5 mg and palbociclib 125 mg starting 07/13/2016. She tolerated this well. Restaging studies with CT chest abdomen and pelvis and bone scan on 07/01/2017 showed stable/ improving lesions. CA 27-29  was stable.  During the last few months of follow up in [place], she completed restaging studies with a CT chest abdomen and pelvis at [facility] on 12/17/2017 showing: A 6 mm pulmonary nodule in the left upper lobe laterally. Progression of metastatic disease to the bones at the L4 and L5 vertebral bodies. Sclerotic metastases at T8, T10, an T11, are similar to previous exams. Sclerotic metastases in the sternum stable. Hepatic steatosis. Aortic atherosclerosis.  Most recent CA-73-29 (January 2019) was 58.  Most recent hemoglobin A1c was 7.1 according to the patient  The patient's subsequent history is as detailed below.   PAST MEDICAL HISTORY: Past Medical History:  Diagnosis Date   Breast cancer metastasized to bone ([place])    Hyperlipidemia    Hypertension    Lymphedema of right arm    Neuropathy associated with cancer ([place])    Obesity (BMI 35.0-39.9 without comorbidity)      PAST SURGICAL HISTORY: Past Surgical History:  Procedure Laterality Date   CATARACT EXTRACTION Left    HYSTERECTOMY ABDOMINAL WITH SALPINGO-OOPHORECTOMY     MODIFIED RADICAL MASTECTOMY Right       FAMILY HISTORY: No family history on file.  The patient reports she had negative genetic testing at [facility] 2014,report not available. --The patient's father died at age 71 due to a PE, and he also had a history of colon cancer diagnosed at age 30. The patient's mother died at age 45 due to diabetes and survived a cerebral aneurysm.The patient's mother also had a history of breast cancer diagnosed at age 52.  The patient has no brothers and 1 sister. The patient's sister was diagnosed with non invasive breast cancer at age 23. There was a paternal grandmother diagnosed with breast cancer at age 60. The patient denies a family history of ovarian cancer.    GYNECOLOGIC HISTORY:  Patient's last menstrual period was 10/01/2006. Menarche: 63 years old Age at first live birth: 63 years old She is  [place] P2.  She is status post hysterectomy with bilateral salpingo- oophorectomy in 2009 at about age 50.  She never used HRT.    SOCIAL HISTORY:  Mia [name] is disabled due to her breast cancer. She used to be Surveyor, quantity for a cardiology office. Her husband, Mia [name] is in the process of retiring. He is a Electrical engineer for a power company. The patient's daughter, Mia [name], lives in [place] and works as a Teaching laboratory technician. The patient's second daughter, Mia [name] is a stay at home mom and is a special needs teacher, who will soon move to [facility]). The patient plans on attending [employer].     ADVANCED DIRECTIVES:    HEALTH MAINTENANCE: Social History   Tobacco Use   Smoking status: Never Smoker   Smokeless tobacco: Never Used  Substance Use Topics   Alcohol use: Not Currently   Drug use: Never    Colonoscopy: 2017   PAP:  Bone density: never  Mammogram: 2017   Allergies  Allergen Reactions   Morphine And Related Anaphylaxis  Current Outpatient Medications  Medication Sig Dispense Refill   amlodipine-atorvastatin (CADUET) 10-10 MG tablet Take 1 tablet by mouth daily.     aspirin 81 MG chewable tablet Chew by mouth daily.     fenofibrate (TRICOR) 145 MG tablet Take 145 mg by mouth daily.     fluticasone (FLONASE) 50 MCG/ACT nasal spray SPRAY 2 SPRAYS INTO EACH NOSTRIL EVERY DAY 48 mL 1   gabapentin (NEURONTIN) 300 MG capsule Take 1 capsule (300 mg total) by mouth 2 (two) times daily. Pt is only taking 2 capsules a day 180 capsule 3   IBRANCE 125 MG tablet TAKE 1 TABLET DAILY WITH BREAKFAST. TAKE WHOLE WITH FOOD FOR 21 DAYS ON AND 7 DAYS OFF EVERY 28 DAYS 63 tablet 3   Insulin Admin Supplies MISC Inject 24 Units into the skin daily.     insulin glargine (LANTUS) 100 unit/mL SOPN Inject 0.24 mLs (24 Units total) into the skin daily. 5 pen 1   Insulin Pen Needle 31G X 5 MM MISC UAD for Sq inj for DM qd 100 each PRN    letrozole (FEMARA) 2.5 MG tablet Take 1 tablet (2.5 mg total) by mouth daily. 90 tablet 4   loratadine (CLARITIN) 10 MG tablet Take 10 mg by mouth daily.     losartan (COZAAR) 100 MG tablet Take 1 tablet (100 mg total) by mouth daily. 90 tablet 1   metoprolol succinate (TOPROL-XL) 25 MG 24 hr tablet Take 1 tablet (25 mg total) by mouth daily. 90 tablet 1   oxyCODONE-acetaminophen (PERCOCET) 10-325 MG tablet Take 1 tablet by mouth every 6 (six) hours as needed for pain. 140 tablet 0   pantoprazole (PROTONIX) 40 MG tablet Take 1 tablet (40 mg total) by mouth daily. 90 tablet 3   pioglitazone (ACTOS) 15 MG tablet Take 1 tablet (15 mg total) by mouth daily. 90 tablet 1   pravastatin (PRAVACHOL) 40 MG tablet Take 40 mg by mouth daily.     sitaGLIPtin-metformin (JANUMET) 50-1000 MG tablet Take 1 tablet by mouth daily. 90 tablet 1   No current facility-administered medications for this visit.     OBJECTIVE: Middle-aged white woman who appears stated age  6:   07/14/19 1103  BP: 121/82  Pulse: 78  Resp: 18  Temp: 98.9 F (37.2 C)  SpO2: 96%   Wt Readings from Last 3 Encounters:  07/14/19 224 lb 14.4 oz (102 kg)  05/19/19 222 lb 6.4 oz (100.9 kg)  03/24/19 223 lb 6.4 oz (101.3 kg)   Body mass index is 38.6 kg/m.    ECOG FS:2 - Symptomatic, <50% confined to bed  Ocular: Sclerae unicteric, pupils round and equal Ear-nose-throat: Wearing a mask Lymphatic: No cervical or supraclavicular adenopathy Lungs no rales or rhonchi Heart regular rate and rhythm Abd soft, nontender, positive bowel sounds MSK mild kyphosis but no focal spinal tenderness, chronic right upper extremity lymphedema, no erythema Neuro: non-focal, well-oriented, appropriate affect Breasts: Deferred   LAB RESULTS:  CMP     Component Value Date/Time   NA 142 07/14/2019 1046   NA 140 01/31/2018   K 4.7 07/14/2019 1046   CL 107 07/14/2019 1046   CO2 24 07/14/2019 1046   GLUCOSE 119 (H) 07/14/2019  1046   BUN 12 07/14/2019 1046   BUN 22 (A) 01/31/2018   CREATININE 1.03 (H) 07/14/2019 1046   CREATININE 1.05 (H) 03/24/2019 0833   CALCIUM 9.5 07/14/2019 1046   PROT 7.1 07/14/2019 1046   ALBUMIN 3.7 07/14/2019 1046  AST 16 07/14/2019 1046   AST 15 03/24/2019 0833   ALT 15 07/14/2019 1046   ALT 16 03/24/2019 0833   ALKPHOS 60 07/14/2019 1046   BILITOT 0.3 07/14/2019 1046   BILITOT 0.3 03/24/2019 0833   GFRNONAA 58 (L) 07/14/2019 1046   GFRNONAA 57 (L) 03/24/2019 0833   GFRAA >60 07/14/2019 1046   GFRAA >60 03/24/2019 0833    No results found for: TOTALPROTELP, ALBUMINELP, A1GS, A2GS, BETS, BETA2SER, GAMS, MSPIKE, SPEI  No results found for: KPAFRELGTCHN, LAMBDASER, KAPLAMBRATIO  Lab Results  Component Value Date   WBC 2.3 (L) 07/14/2019   NEUTROABS 1.7 07/14/2019   HGB 11.3 (L) 07/14/2019   HCT 34.4 (L) 07/14/2019   MCV 106.8 (H) 07/14/2019   PLT 251 07/14/2019    '@LASTCHEMISTRY' @  No results found for: LABCA2  No components found for: YCXKGY185  No results for input(s): INR in the last 168 hours.  No results found for: LABCA2  No results found for: UDJ497  No results found for: WYO378  No results found for: HYI502  Lab Results  Component Value Date   CA2729 84.9 (H) 06/16/2019    No components found for: HGQUANT  No results found for: CEA1 / No results found for: CEA1   No results found for: AFPTUMOR  No results found for: [place]  No results found for: PSA1  Appointment on 07/14/2019  Component Date Value Ref Range Status   Sodium 07/14/2019 142  135 - 145 mmol/L Final   Potassium 07/14/2019 4.7  3.5 - 5.1 mmol/L Final   Chloride 07/14/2019 107  98 - 111 mmol/L Final   CO2 07/14/2019 24  22 - 32 mmol/L Final   Glucose, Bld 07/14/2019 119* 70 - 99 mg/dL Final   BUN 07/14/2019 12  8 - 23 mg/dL Final   Creatinine, Ser 07/14/2019 1.03* 0.44 - 1.00 mg/dL Final   Calcium 07/14/2019 9.5  8.9 - 10.3 mg/dL Final   Total Protein  07/14/2019 7.1  6.5 - 8.1 g/dL Final   Albumin 07/14/2019 3.7  3.5 - 5.0 g/dL Final   AST 07/14/2019 16  15 - 41 U/L Final   ALT 07/14/2019 15  0 - 44 U/L Final   Alkaline Phosphatase 07/14/2019 60  38 - 126 U/L Final   Total Bilirubin 07/14/2019 0.3  0.3 - 1.2 mg/dL Final   GFR calc non Af Amer 07/14/2019 58* >60 mL/min Final   GFR calc Af Amer 07/14/2019 >60  >60 mL/min Final   Anion gap 07/14/2019 11  5 - 15 Final   Performed at [facility], [place] [address] [place], [place] [address]   WBC 07/14/2019 2.3* 4.0 - 10.5 K/uL Final   RBC 07/14/2019 3.22* 3.87 - 5.11 MIL/uL Final   Hemoglobin 07/14/2019 11.3* 12.0 - 15.0 g/dL Final   HCT 07/14/2019 34.4* 36.0 - 46.0 % Final   MCV 07/14/2019 106.8* 80.0 - 100.0 fL Final   MCH 07/14/2019 35.1* 26.0 - 34.0 pg Final   MCHC 07/14/2019 32.8  30.0 - 36.0 g/dL Final   RDW 07/14/2019 14.6  11.5 - 15.5 % Final   Platelets 07/14/2019 251  150 - 400 K/uL Final   nRBC 07/14/2019 0.0  0.0 - 0.2 % Final   Neutrophils Relative % 07/14/2019 71  % Final   Neutro Abs 07/14/2019 1.7  1.7 - 7.7 K/uL Final   Lymphocytes Relative 07/14/2019 17  % Final   Lymphs Abs 07/14/2019 0.4* 0.7 - 4.0 K/uL Final  Monocytes Relative 07/14/2019 9  % Final   Monocytes Absolute 07/14/2019 0.2  0.1 - 1.0 K/uL Final   Eosinophils Relative 07/14/2019 1  % Final   Eosinophils Absolute 07/14/2019 0.0  0.0 - 0.5 K/uL Final   Basophils Relative 07/14/2019 1  % Final   Basophils Absolute 07/14/2019 0.0  0.0 - 0.1 K/uL Final   Immature Granulocytes 07/14/2019 1  % Final   Abs Immature Granulocytes 07/14/2019 0.02  0.00 - 0.07 K/uL Final   Polychromasia 07/14/2019 PRESENT   Final   Performed at [facility], [place] [address], [place], [place] [address]    (this displays the last labs from the last 3 days)  No results found for: TOTALPROTELP, ALBUMINELP, A1GS, A2GS, BETS, BETA2SER, GAMS, MSPIKE,  SPEI (this displays SPEP labs)  No results found for: KPAFRELGTCHN, LAMBDASER, KAPLAMBRATIO (kappa/lambda light chains)  No results found for: HGBA, HGBA2QUANT, HGBFQUANT, HGBSQUAN (Hemoglobinopathy evaluation)   No results found for: LDH  No results found for: IRON, TIBC, IRONPCTSAT (Iron and TIBC)  No results found for: FERRITIN  Urinalysis No results found for: COLORURINE, APPEARANCEUR, LABSPEC, PHURINE, GLUCOSEU, HGBUR, BILIRUBINUR, KETONESUR, PROTEINUR, UROBILINOGEN, NITRITE, LEUKOCYTESUR   STUDIES: No results found.   ELIGIBLE FOR AVAILABLE RESEARCH PROTOCOL: no   ASSESSMENT: 63 y.o. [place], [place] woman with stage IV breast cancer, as follows:  (1) status post right mastectomy in 2000, for a pT2 pN2, stage IIIA invasive ductal carcinoma, estrogen receptor positive, progesterone receptor not tested, HER-2 negative (0) by immunohistochemistry  (a) adjuvant chemotherapy with doxorubicin and cyclophosphamide in dose dense fashion x4 followed by paclitaxel in dose dense fashion x4  (b) adjuvant radiation: 30 doses  (c) antiestrogens: Tamoxifen for 5 years, completed 2005  (2) METASTASTIC DISEASE: 2008, involving bones, lungs, and lymph nodes  (a) CA 27-29 is informative  (b) CT of the chest abdomen and pelvis in [facility] finds stable sclerotic metastases (T8, T10, T11, sternum, L4, L5); 0.6 cm left upper lobe lung nodule stable  (3)  prior anti-estrogen treatments:  (a) fulvestrant--progression  (b) exemestane/everolimus--hyperlipidemia, hyperglycemia  (4) prior chemotherapy treatments:  (a) capecitabine: progression  (b) Abraxane, August 2014 through 11/18/2015: good response but stopped due to neuropathy  (c) gemcitabine 01/20/2016--with multiple interruptions secondary to infections  (5) radiation therapy:  (a) T spine and Right femur, completed 01/10/2016  (6) letrozole/ palbociclib started 07/13/2016  (7) bone treatment:  (a)  denosumab/Xgeva--discontinued January 2016 due to osteonecrosis of the jaw  (8) cancer associated pain:  (a) currently on oxycodone/APAP 10/325 QID as needed  (b) PMP Aware reviewed 12/04/2018-- last script 11/06/2018  (9) restaging studies obtained November/December 2019 show  (a) mostly stable bone lesions with evidence of progression at L3-L5; no epidural tumor by total spinal MRI in November 2019  (b) no evidence of progressive lung, lymph node or liver lesions by CT scans of the chest abdomen and pelvis and bone scan December 2019  (10) palliative radiation 11/18/2018 through 12/02/2018 Site/dose:   1. Lumbar spine; 35 Gy in 14 fractions of 2.5 Gy                      2. Pelvis; 35 Gy in 14 fractions of 2.5 Gy   PLAN: Mia [name] is now 12 years out from definitive diagnosis of metastatic breast cancer.  Her disease is well controlled on her current medications and she is tolerating letrozole and palbociclib well.  Accordingly we are continuing these.  She will return  next month for labs only, and in 2 months for labs and a visit.  Before the December visit she will be restaged with CT scans of the chest abdomen and pelvis.  I have encouraged her to use her compression sleeve at all times to prevent worsening of the chronic issue.  I am delighted that she is using a cane and that it is helping.  We again discussed pandemic precautions  She knows to call for any other issue that may develop before the next visit.   Mia [name], Mia Dad, MD  07/14/19 12:32 PM Medical Oncology and Hematology [facility] [place], [place] [address] Tel. [phone]    Fax. [phone]  I, Jacqualyn Posey am acting as a Education administrator for Chauncey Cruel, MD.   I, Lurline Del MD, have reviewed the above documentation for accuracy and completeness, and I agree with the above.

## 2019-07-15 LAB — CANCER ANTIGEN 27.29: CA 27.29: 108.3 U/mL — ABNORMAL HIGH (ref 0.0–38.6)

## 2019-07-16 ENCOUNTER — Telehealth: Payer: Self-pay | Admitting: Oncology

## 2019-07-16 NOTE — Telephone Encounter (Signed)
I talk with patient regarding schedule  

## 2019-07-18 ENCOUNTER — Other Ambulatory Visit: Payer: Self-pay | Admitting: Family Medicine

## 2019-08-11 ENCOUNTER — Inpatient Hospital Stay: Payer: MEDICARE | Attending: Oncology

## 2019-08-11 ENCOUNTER — Other Ambulatory Visit: Payer: Self-pay

## 2019-08-11 DIAGNOSIS — C7951 Secondary malignant neoplasm of bone: Secondary | ICD-10-CM

## 2019-08-11 DIAGNOSIS — C50911 Malignant neoplasm of unspecified site of right female breast: Secondary | ICD-10-CM | POA: Insufficient documentation

## 2019-08-11 DIAGNOSIS — M879 Osteonecrosis, unspecified: Secondary | ICD-10-CM

## 2019-08-11 DIAGNOSIS — C7800 Secondary malignant neoplasm of unspecified lung: Secondary | ICD-10-CM

## 2019-08-11 DIAGNOSIS — C50919 Malignant neoplasm of unspecified site of unspecified female breast: Secondary | ICD-10-CM

## 2019-08-11 LAB — CBC WITH DIFFERENTIAL/PLATELET
Abs Immature Granulocytes: 0.01 10*3/uL (ref 0.00–0.07)
Basophils Absolute: 0 10*3/uL (ref 0.0–0.1)
Basophils Relative: 2 %
Eosinophils Absolute: 0 10*3/uL (ref 0.0–0.5)
Eosinophils Relative: 2 %
HCT: 31.7 % — ABNORMAL LOW (ref 36.0–46.0)
Hemoglobin: 10.6 g/dL — ABNORMAL LOW (ref 12.0–15.0)
Immature Granulocytes: 1 %
Lymphocytes Relative: 15 %
Lymphs Abs: 0.3 10*3/uL — ABNORMAL LOW (ref 0.7–4.0)
MCH: 35.1 pg — ABNORMAL HIGH (ref 26.0–34.0)
MCHC: 33.4 g/dL (ref 30.0–36.0)
MCV: 105 fL — ABNORMAL HIGH (ref 80.0–100.0)
Monocytes Absolute: 0.2 10*3/uL (ref 0.1–1.0)
Monocytes Relative: 10 %
Neutro Abs: 1.4 10*3/uL — ABNORMAL LOW (ref 1.7–7.7)
Neutrophils Relative %: 70 %
Platelets: 250 10*3/uL (ref 150–400)
RBC: 3.02 MIL/uL — ABNORMAL LOW (ref 3.87–5.11)
RDW: 14.6 % (ref 11.5–15.5)
WBC: 2 10*3/uL — ABNORMAL LOW (ref 4.0–10.5)
nRBC: 0 % (ref 0.0–0.2)

## 2019-08-11 LAB — COMPREHENSIVE METABOLIC PANEL
ALT: 16 U/L (ref 0–44)
AST: 16 U/L (ref 15–41)
Albumin: 3.5 g/dL (ref 3.5–5.0)
Alkaline Phosphatase: 57 U/L (ref 38–126)
Anion gap: 10 (ref 5–15)
BUN: 18 mg/dL (ref 8–23)
CO2: 24 mmol/L (ref 22–32)
Calcium: 9.3 mg/dL (ref 8.9–10.3)
Chloride: 105 mmol/L (ref 98–111)
Creatinine, Ser: 1.07 mg/dL — ABNORMAL HIGH (ref 0.44–1.00)
GFR calc Af Amer: >60 mL/min (ref >60)
GFR calc non Af Amer: 55 mL/min — ABNORMAL LOW (ref >60)
Glucose, Bld: 158 mg/dL — ABNORMAL HIGH (ref 70–99)
Potassium: 4.4 mmol/L (ref 3.5–5.1)
Sodium: 139 mmol/L (ref 135–145)
Total Bilirubin: 0.3 mg/dL (ref 0.3–1.2)
Total Protein: 6.8 g/dL (ref 6.5–8.1)

## 2019-08-13 LAB — CANCER ANTIGEN 27.29: CA 27.29: 108.8 U/mL — ABNORMAL HIGH (ref 0.0–38.6)

## 2019-08-17 ENCOUNTER — Encounter: Payer: Self-pay | Admitting: Oncology

## 2019-08-19 ENCOUNTER — Other Ambulatory Visit: Payer: Self-pay | Admitting: Oncology

## 2019-08-19 DIAGNOSIS — C50919 Malignant neoplasm of unspecified site of unspecified female breast: Secondary | ICD-10-CM

## 2019-08-19 MED ORDER — OXYCODONE-ACETAMINOPHEN 10-325 MG PO TABS: 1.0000 | ORAL_TABLET | Freq: Four times a day (QID) | ORAL | 0 refills | Status: DC | PRN

## 2019-09-01 ENCOUNTER — Ambulatory Visit (HOSPITAL_COMMUNITY)
Admission: RE | Admit: 2019-09-01 | Discharge: 2019-09-01 | Disposition: A | Discharge status: Home / Self Care | Payer: MEDICARE | Source: Ambulatory Visit | Attending: Oncology | Admitting: Oncology

## 2019-09-01 ENCOUNTER — Other Ambulatory Visit: Payer: Self-pay

## 2019-09-01 DIAGNOSIS — C50919 Malignant neoplasm of unspecified site of unspecified female breast: Secondary | ICD-10-CM | POA: Diagnosis not present

## 2019-09-01 DIAGNOSIS — G620 Drug-induced polyneuropathy: Secondary | ICD-10-CM | POA: Insufficient documentation

## 2019-09-01 DIAGNOSIS — K760 Fatty (change of) liver, not elsewhere classified: Secondary | ICD-10-CM | POA: Diagnosis not present

## 2019-09-01 DIAGNOSIS — C7951 Secondary malignant neoplasm of bone: Secondary | ICD-10-CM | POA: Insufficient documentation

## 2019-09-01 DIAGNOSIS — Z23 Encounter for immunization: Secondary | ICD-10-CM

## 2019-09-01 DIAGNOSIS — M879 Osteonecrosis, unspecified: Secondary | ICD-10-CM | POA: Diagnosis not present

## 2019-09-01 DIAGNOSIS — C7800 Secondary malignant neoplasm of unspecified lung: Secondary | ICD-10-CM

## 2019-09-01 DIAGNOSIS — I10 Essential (primary) hypertension: Secondary | ICD-10-CM | POA: Diagnosis not present

## 2019-09-01 DIAGNOSIS — T451X5A Adverse effect of antineoplastic and immunosuppressive drugs, initial encounter: Secondary | ICD-10-CM | POA: Insufficient documentation

## 2019-09-01 MED ORDER — SODIUM CHLORIDE (PF) 0.9 % IJ SOLN
INTRAMUSCULAR | Status: AC
  Filled 2019-09-01: qty 50

## 2019-09-01 MED ORDER — IOHEXOL 300 MG/ML  SOLN
100.0000 mL | Freq: Once | INTRAMUSCULAR | Status: AC | PRN
  Administered 2019-09-01: 100 mL via INTRAVENOUS

## 2019-09-01 MED ORDER — IOHEXOL 9 MG/ML PO SOLN
ORAL | Status: AC
  Filled 2019-09-01: qty 1000

## 2019-09-01 MED ORDER — IOHEXOL 9 MG/ML PO SOLN
1000.0000 mL | ORAL | Status: AC
  Administered 2019-09-01: 09:00:00 1000 mL via ORAL

## 2019-09-02 ENCOUNTER — Other Ambulatory Visit: Payer: Self-pay | Admitting: Oncology

## 2019-09-02 DIAGNOSIS — C50919 Malignant neoplasm of unspecified site of unspecified female breast: Secondary | ICD-10-CM

## 2019-09-02 DIAGNOSIS — K760 Fatty (change of) liver, not elsewhere classified: Secondary | ICD-10-CM

## 2019-09-02 NOTE — Progress Notes (Signed)
I called [name] and let her know the results of her CT scan which are essentially stable.  We are continuing treatment as before.  She is already scheduled to follow-up with me at the end of this month.

## 2019-09-15 NOTE — Progress Notes (Signed)
Virtual Visit via Video Note  I connected with patient on 09/16/19 at  2:00 PM EST by audio enabled telemedicine application and verified that I am speaking with the correct person using two identifiers.   THIS ENCOUNTER IS A VIRTUAL VISIT DUE TO COVID-19 - PATIENT WAS NOT SEEN IN THE OFFICE. PATIENT HAS CONSENTED TO VIRTUAL VISIT / TELEMEDICINE VISIT   Location of patient: home  Location of provider: office  I discussed the limitations of evaluation and management by telemedicine and the availability of in person appointments. The patient expressed understanding and agreed to proceed.   Subjective:   Mia [name] is a 63 y.o. female who presents for an Initial Medicare Annual Wellness Visit.  Review of Systems    Home Safety/Smoke Alarms: Feels safe in home. Smoke alarms in place.  Lives w/ husband in 1 story home. Uses cane. States breast cancer has metastasized to her bones.    Female:     Mammo- declines       Dexa scan-  09/08/18      CCS- due 06/03/25    Objective:     Advanced Directives 09/16/2019 10/02/2018 03/17/2018  Does Patient Have a Medical Advance Directive? Yes Yes Yes  Type of Paramedic of [place];Living will Healthcare Power of [place];Living will  Does patient want to make changes to medical advance directive? No - Patient declined - -  Copy of [place] in Chart? No - copy requested No - copy requested No - copy requested    Current Medications (verified) Outpatient Encounter Medications as of 09/16/2019  Medication Sig  . amlodipine-atorvastatin (CADUET) 10-10 MG tablet Take 1 tablet by mouth daily.  Marland Kitchen aspirin 81 MG chewable tablet Chew by mouth daily.  . fenofibrate (TRICOR) 145 MG tablet Take 145 mg by mouth daily.  . fluticasone (FLONASE) 50 MCG/ACT nasal spray SPRAY 2 SPRAYS INTO EACH NOSTRIL EVERY DAY  . gabapentin (NEURONTIN) 300 MG capsule Take 1 capsule (300 mg total)  by mouth 2 (two) times daily. Pt is only taking 2 capsules a day  . IBRANCE 125 MG tablet TAKE 1 TABLET DAILY WITH BREAKFAST. TAKE WHOLE WITH FOOD FOR 21 DAYS ON AND 7 DAYS OFF EVERY 28 DAYS  . Insulin Admin Supplies MISC Inject 24 Units into the skin daily.  . Insulin Pen Needle 31G X 5 MM MISC UAD for Sq inj for DM qd  . LANTUS SOLOSTAR 100 UNIT/ML Solostar Pen NJECT 24 UNITS INTO THE SKIN DAILY  . letrozole (FEMARA) 2.5 MG tablet Take 1 tablet (2.5 mg total) by mouth daily.  Marland Kitchen loratadine (CLARITIN) 10 MG tablet Take 10 mg by mouth daily.  Marland Kitchen losartan (COZAAR) 100 MG tablet Take 1 tablet (100 mg total) by mouth daily.  . metoprolol succinate (TOPROL-XL) 25 MG 24 hr tablet Take 1 tablet (25 mg total) by mouth daily.  Marland Kitchen oxyCODONE-acetaminophen (PERCOCET) 10-325 MG tablet Take 1 tablet by mouth every 6 (six) hours as needed for pain.  . pantoprazole (PROTONIX) 40 MG tablet Take 1 tablet (40 mg total) by mouth daily.  . pioglitazone (ACTOS) 15 MG tablet Take 1 tablet (15 mg total) by mouth daily.  . pravastatin (PRAVACHOL) 40 MG tablet Take 40 mg by mouth daily.  . sitaGLIPtin-metformin (JANUMET) 50-1000 MG tablet Take 1 tablet by mouth daily.   No facility-administered encounter medications on file as of 09/16/2019.    Allergies (verified) Morphine and related   History: Past Medical  History:  Diagnosis Date  . Breast cancer metastasized to bone ([place])   . Hyperlipidemia   . Hypertension   . Lymphedema of right arm   . Neuropathy associated with cancer ([place])   . Obesity (BMI 35.0-39.9 without comorbidity)    Past Surgical History:  Procedure Laterality Date  . CATARACT EXTRACTION Left   . HYSTERECTOMY ABDOMINAL WITH SALPINGO-OOPHORECTOMY    . MODIFIED RADICAL MASTECTOMY Right    History reviewed. No pertinent family history. Social History   Socioeconomic History  . Marital status: Married    Spouse name: Not on file  . Number of children: Not on file  . Years of education:  Not on file  . Highest education level: Not on file  Occupational History  . Not on file  Tobacco Use  . Smoking status: Never Smoker  . Smokeless tobacco: Never Used  Substance and Sexual Activity  . Alcohol use: Not Currently  . Drug use: Never  . Sexual activity: Not Currently  Other Topics Concern  . Not on file  Social History Narrative  . Not on file   Social Determinants of Health   Financial Resource Strain:   . Difficulty of Paying Living Expenses: Not on file  Food Insecurity:   . Worried About Charity fundraiser in the Last Year: Not on file  . Ran Out of Food in the Last Year: Not on file  Transportation Needs:   . Lack of Transportation (Medical): Not on file  . Lack of Transportation (Non-Medical): Not on file  Physical Activity:   . Days of Exercise per Week: Not on file  . Minutes of Exercise per Session: Not on file  Stress:   . Feeling of Stress : Not on file  Social Connections:   . Frequency of Communication with Friends and Family: Not on file  . Frequency of Social Gatherings with Friends and Family: Not on file  . Attends Religious Services: Not on file  . Active Member of Clubs or Organizations: Not on file  . Attends Archivist Meetings: Not on file  . Marital Status: Not on file    Tobacco Counseling Counseling given: Not Answered   Clinical Intake: Pain : No/denies pain    Activities of Daily Living In your present state of health, do you have any difficulty performing the following activities: 09/16/2019  Hearing? N  Vision? N  Difficulty concentrating or making decisions? N  Walking or climbing stairs? N  Dressing or bathing? N  Doing errands, shopping? N  Preparing Food and eating ? N  Using the Toilet? N  In the past six months, have you accidently leaked urine? N  Do you have problems with loss of bowel control? N  Managing your Medications? N  Managing your Finances? N  Housekeeping or managing your  Housekeeping? N  Some recent data might be hidden     Immunizations and Health Maintenance Immunization History  Administered Date(s) Administered  . Influenza,inj,Quad PF,6+ Mos 09/02/2018, 07/14/2019  . Influenza-Unspecified 06/30/2010, 08/12/2013, 07/02/2014, 07/13/2015, 07/18/2016  . Pneumococcal Polysaccharide-23 08/11/2008  . Td 10/20/2002  . Tdap 06/24/2012   Health Maintenance Due  Topic Date Due  . MAMMOGRAM  06/03/2018  . OPHTHALMOLOGY EXAM  02/01/2019  . FOOT EXAM  06/04/2019    Patient Care Team: Lucille Passy, MD as PCP - General (Family Medicine) Magrinat, Virgie Dad, MD as Consulting Physician (Oncology)  Indicate any recent Medical Services you may have received from other than  [facility] providers in the past year (date may be approximate).     Assessment:   This is a routine wellness examination for Opha. Physical assessment deferred to PCP.  Hearing/Vision screen Unable to assess. This visit is enabled though telemedicine due to Covid 19.   Dietary issues and exercise activities discussed: Current Exercise Habits: The patient does not participate in regular exercise at present, Exercise limited by: None identified Diet (meal preparation, eat out, water intake, caffeinated beverages, dairy products, fruits and vegetables): in general, a "healthy" diet  , well balanced       Goals    . Patient Stated     Just to maintain health and stay safe during pandemic.      Depression Screen PHQ 2/9 Scores 09/16/2019 06/03/2018  PHQ - 2 Score 0 0    Fall Risk Fall Risk  09/16/2019 06/03/2018  Falls in the past year? 0 No  Follow up Education provided;Falls prevention discussed -    Cognitive Function: Ad8 score reviewed for issues:  Issues making decisions:no  Less interest in hobbies / activities:no  Repeats questions, stories (family complaining):no  Trouble using ordinary gadgets (microwave, computer, phone):no  Forgets the month or year:  no  Mismanaging finances: no  Remembering appts:no  Daily problems with thinking and/or memory:no Ad8 score is=0          Screening Tests Health Maintenance  Topic Date Due  . MAMMOGRAM  06/03/2018  . OPHTHALMOLOGY EXAM  02/01/2019  . FOOT EXAM  06/04/2019  . HEMOGLOBIN A1C  12/16/2019  . TETANUS/TDAP  06/24/2022  . COLONOSCOPY  06/03/2025  . INFLUENZA VACCINE  Completed  . PNEUMOCOCCAL POLYSACCHARIDE VACCINE AGE 52-64 HIGH RISK  Completed  . PAP SMEAR-Modifier  Discontinued  . Hepatitis C Screening  Discontinued  . HIV Screening  Discontinued      Plan:   See you next year!  Continue to eat heart healthy diet (full of fruits, vegetables, whole grains, lean protein, water--limit salt, fat, and sugar intake) and increase physical activity as tolerated.  Continue doing brain stimulating activities (puzzles, reading, adult coloring books, staying active) to keep memory sharp.   Bring a copy of your living will and/or healthcare power of attorney to your next office visit.   I have personally reviewed and noted the following in the patient's chart:   . Medical and social history . Use of alcohol, tobacco or illicit drugs  . Current medications and supplements . Functional ability and status . Nutritional status . Physical activity . Advanced directives . List of other physicians . Hospitalizations, surgeries, and ER visits in previous 12 months . Vitals . Screenings to include cognitive, depression, and falls . Referrals and appointments  In addition, I have reviewed and discussed with patient certain preventive protocols, quality metrics, and best practice recommendations. A written personalized care plan for preventive services as well as general preventive health recommendations were provided to patient.     Shela Nevin, [place]   09/16/2019

## 2019-09-16 ENCOUNTER — Ambulatory Visit (INDEPENDENT_AMBULATORY_CARE_PROVIDER_SITE_OTHER): Payer: MEDICARE | Admitting: *Deleted

## 2019-09-16 ENCOUNTER — Encounter: Payer: Self-pay | Admitting: *Deleted

## 2019-09-16 DIAGNOSIS — Z0000 Encounter for general adult medical examination without abnormal findings: Secondary | ICD-10-CM | POA: Diagnosis not present

## 2019-09-16 NOTE — Patient Instructions (Signed)
See you next year!  Continue to eat heart healthy diet (full of fruits, vegetables, whole grains, lean protein, water--limit salt, fat, and sugar intake) and increase physical activity as tolerated.  Continue doing brain stimulating activities (puzzles, reading, adult coloring books, staying active) to keep memory sharp.   Bring a copy of your living will and/or healthcare power of attorney to your next office visit.   Mia [name] , Thank you for taking time to come for your Medicare Wellness Visit. I appreciate your ongoing commitment to your health goals. Please review the following plan we discussed and let me know if I can assist you in the future.   These are the goals we discussed: Goals    . Patient Stated     Just to maintain health and stay safe during pandemic.       This is a list of the screening recommended for you and due dates:  Health Maintenance  Topic Date Due  . Mammogram  06/03/2018  . Eye exam for diabetics  02/01/2019  . Complete foot exam   06/04/2019  . Hemoglobin A1C  12/16/2019  . Tetanus Vaccine  06/24/2022  . Colon Cancer Screening  06/03/2025  . Flu Shot  Completed  . Pneumococcal vaccine  Completed  . Pap Smear  Discontinued  .  Hepatitis C: One time screening is recommended by Center for Disease Control  (CDC) for  adults born from [birth year] through 1965.   Discontinued  . HIV Screening  Discontinued    Preventive Care 39-60 Years Old, Female Preventive care refers to visits with your health care provider and lifestyle choices that can promote health and wellness. This includes:  A yearly physical exam. This may also be called an annual well check.  Regular dental visits and eye exams.  Immunizations.  Screening for certain conditions.  Healthy lifestyle choices, such as eating a healthy diet, getting regular exercise, not using drugs or products that contain nicotine and tobacco, and limiting alcohol use. What can I expect for my preventive  care visit? Physical exam Your health care provider will check your:  Height and weight. This may be used to calculate body mass index (BMI), which tells if you are at a healthy weight.  Heart rate and blood pressure.  Skin for abnormal spots. Counseling Your health care provider may ask you questions about your:  Alcohol, tobacco, and drug use.  Emotional well-being.  Home and relationship well-being.  Sexual activity.  Eating habits.  Work and work Statistician.  Method of birth control.  Menstrual cycle.  Pregnancy history. What immunizations do I need?  Influenza (flu) vaccine  This is recommended every year. Tetanus, diphtheria, and pertussis (Tdap) vaccine  You may need a Td booster every 10 years. Varicella (chickenpox) vaccine  You may need this if you have not been vaccinated. Zoster (shingles) vaccine  You may need this after age 83. Measles, mumps, and rubella (MMR) vaccine  You may need at least one dose of MMR if you were born in 1957 or later. You may also need a second dose. Pneumococcal conjugate (PCV13) vaccine  You may need this if you have certain conditions and were not previously vaccinated. Pneumococcal polysaccharide (PPSV23) vaccine  You may need one or two doses if you smoke cigarettes or if you have certain conditions. Meningococcal conjugate (MenACWY) vaccine  You may need this if you have certain conditions. Hepatitis A vaccine  You may need this if you have certain conditions or if  you travel or work in places where you may be exposed to hepatitis A. Hepatitis B vaccine  You may need this if you have certain conditions or if you travel or work in places where you may be exposed to hepatitis B. Haemophilus influenzae type b (Hib) vaccine  You may need this if you have certain conditions. Human papillomavirus (HPV) vaccine  If recommended by your health care provider, you may need three doses over 6 months. You may receive  vaccines as individual doses or as more than one vaccine together in one shot (combination vaccines). Talk with your health care provider about the risks and benefits of combination vaccines. What tests do I need? Blood tests  Lipid and cholesterol levels. These may be checked every 5 years, or more frequently if you are over 15 years old.  Hepatitis C test.  Hepatitis B test. Screening  Lung cancer screening. You may have this screening every year starting at age 76 if you have a 30-pack-year history of smoking and currently smoke or have quit within the past 15 years.  Colorectal cancer screening. All adults should have this screening starting at age 68 and continuing until age 62. Your health care provider may recommend screening at age 72 if you are at increased risk. You will have tests every 1-10 years, depending on your results and the type of screening test.  Diabetes screening. This is done by checking your blood sugar (glucose) after you have not eaten for a while (fasting). You may have this done every 1-3 years.  Mammogram. This may be done every 1-2 years. Talk with your health care provider about when you should start having regular mammograms. This may depend on whether you have a family history of breast cancer.  BRCA-related cancer screening. This may be done if you have a family history of breast, ovarian, tubal, or peritoneal cancers.  Pelvic exam and Pap test. This may be done every 3 years starting at age 50. Starting at age 69, this may be done every 5 years if you have a Pap test in combination with an HPV test. Other tests  Sexually transmitted disease (STD) testing.  Bone density scan. This is done to screen for osteoporosis. You may have this scan if you are at high risk for osteoporosis. Follow these instructions at home: Eating and drinking  Eat a diet that includes fresh fruits and vegetables, whole grains, lean protein, and low-fat dairy.  Take vitamin  and mineral supplements as recommended by your health care provider.  Do not drink alcohol if: ? Your health care provider tells you not to drink. ? You are pregnant, may be pregnant, or are planning to become pregnant.  If you drink alcohol: ? Limit how much you have to 0-1 drink a day. ? Be aware of how much alcohol is in your drink. In the [place] one drink equals one 12 oz bottle of beer (355 mL), one 5 oz glass of wine (148 mL), or one 1 oz glass of hard liquor (44 mL). Lifestyle  Take daily care of your teeth and gums.  Stay active. Exercise for at least 30 minutes on 5 or more days each week.  Do not use any products that contain nicotine or tobacco, such as cigarettes, e-cigarettes, and chewing tobacco. If you need help quitting, ask your health care provider.  If you are sexually active, practice safe sex. Use a condom or other form of birth control (contraception) in order to prevent pregnancy  and STIs (sexually transmitted infections).  If told by your health care provider, take low-dose aspirin daily starting at age 53. What's next?  Visit your health care provider once a year for a well check visit.  Ask your health care provider how often you should have your eyes and teeth checked.  Stay up to date on all vaccines. This information is not intended to replace advice given to you by your health care provider. Make sure you discuss any questions you have with your health care provider. Document Released: 10/14/2015 Document Revised: 05/29/2018 Document Reviewed: 05/29/2018 Elsevier Patient Education  2020 [employer].

## 2019-09-22 NOTE — Progress Notes (Signed)
Medical screening examination/treatment/procedure(s) were performed by the Wellness Coach, RN. As primary care provider I was immediately available for consulation/collaboration. I agree with above documentation. Claudius Mich, AGNP-C 

## 2019-09-23 ENCOUNTER — Other Ambulatory Visit: Payer: Self-pay

## 2019-09-23 DIAGNOSIS — Z760 Encounter for issue of repeat prescription: Secondary | ICD-10-CM

## 2019-09-23 MED ORDER — FLUTICASONE PROPIONATE 50 MCG/ACT NA SUSP: NASAL | 1 refills | Status: DC

## 2019-09-23 NOTE — Telephone Encounter (Signed)
Last VV 03/17/19 Last fill 04/06/19  #58ml/1

## 2019-09-28 NOTE — Progress Notes (Signed)
[place]  [phone] [phone]) [phone]    ID: Mia [name] DOB: September 28, 1956  MR#: [id number]  OVZ#:[id number]  Patient Care Team: Mia Passy, MD as PCP - General (Family Medicine) Mia [name], Mia Dad, MD as Consulting Physician (Oncology) OTHER MD: Mia Austria MD, Mia Sabal PA   CHIEF COMPLAINT: Estrogen receptor positive stage IV breast cancer  CURRENT TREATMENT: Letrozole, palbociclib   INTERVAL HISTORY: Mia [name] returns today for follow-up and treatment of her etrogen receptor positive stage IV breast cancer.    She continues on letrozole.  Hot flashes are decreased and vaginal dryness is not a problem.  She is on gabapentin for her pain and this also helps with the hot flashes.  She also continues on palbociclib.  She is always tired and cannot tell if the palbociclib makes it is any worse.  She does not feel better on the "off week".  There is no bone density screening on file.  She is not receiving denosumab/Xgeva or zoledronate because of her history of osteonecrosis of the jaw.    She has significant bony pain and she receives oxycodone/acetaminophen.  Have checked PMP aware today and we are her only prescribers and she only uses 1 pharmacy.  Most days she takes pain pill 4 times a day but sometimes she has to take extra.  She does not get constipated from this.  Since her last visit, she underwent restaging chest, abdomen, pelvis CT on 09/01/2019. This showed: stable scattered osseous metastatic disease with no new or enlarging bony lesions; no findings of active soft tissue malignancy; small, increased left pleural effusion; mostly stable fain ground-glass opacities, likely chronic treatment effects.  We are following her CA 27-29. Lab Results  Component Value Date   CA2729 108.8 (H) 08/11/2019   CA2729 108.3 (H) 07/14/2019   CA2729 84.9 (H) 06/16/2019   CA2729 92.2 (H) 05/19/2019   CA2729 84.2 (H) 04/21/2019     REVIEW OF  SYSTEMS: Mia [name] is essentially stable.   HISTORY OF CURRENT ILLNESS: From the original intake note:  We have reviewed the medical records from [place], which is the source of the information below:  Mia [name] was initially diagnosed with Stage IIIA (T2N2M0) invasive ductal carcinoma, estrogen receptor positive and HER2 negative right breast cancer in 2000. She underwent right mastectomy, with 4 positive metastatic lymph nodes. She had chemotherapy with taxol and cytoxan and fulvestrant in the past. She had radiation and completed 5 years of tamoxifen.   She was diagnosed with Stage IV disease 04/18/2005 with metastases to the bone, lung, and additional nodes. She was on capecitabine but was discontinued after rising CA 27-29 and scans.   She started anastrozole and everolimus with Delton See in April 2014. She tolerated this well, but this was discontinued in July 2014 due to abnormal blood tests, hyperlipidemia and hyperglycemia.  She began Abraxane 1102m /m2 and Xgeva in August 2014 weekly x3 with 1 week off. She tolerated this treatment well. She discontinued Xgeva January 2016 due to osteonecrosis of the jaw and mouth issues. She continued abraxane alone until her last dose on 11/18/2015 due to neuropathy and disease progression with restaging studies on 11/23/2015 with a CT chest abdomen and pelvis and one scan showing skull, sternal and femoral lesions.   She was switched to gemcitabine starting 01/20/2016 weekly x3 and 1 week off. This was discontinued on 06/15/2016 due to a port related jugular clot on the right side. She was given Lovenox for 3 months.  She was switched to Letrozole 2.5 mg and palbociclib 125 mg starting 07/13/2016. She tolerated this well. Restaging studies with CT chest abdomen and pelvis and bone scan on 07/01/2017 showed stable/ improving lesions. CA 27-29 was stable.  During the last few months of follow up in [place], she completed restaging studies  with a CT chest abdomen and pelvis at [facility] on 12/17/2017 showing: A 6 mm pulmonary nodule in the left upper lobe laterally. Progression of metastatic disease to the bones at the L4 and L5 vertebral bodies. Sclerotic metastases at T8, T10, an T11, are similar to previous exams. Sclerotic metastases in the sternum stable. Hepatic steatosis. Aortic atherosclerosis.  Most recent CA-73-29 (January 2019) was 58.  Most recent hemoglobin A1c was 7.1 according to the patient  The patient's subsequent history is as detailed below.   PAST MEDICAL HISTORY: Past Medical History:  Diagnosis Date  . Breast cancer metastasized to bone ([place])   . Hyperlipidemia   . Hypertension   . Lymphedema of right arm   . Neuropathy associated with cancer ([place])   . Obesity (BMI 35.0-39.9 without comorbidity)      PAST SURGICAL HISTORY: Past Surgical History:  Procedure Laterality Date  . CATARACT EXTRACTION Left   . HYSTERECTOMY ABDOMINAL WITH SALPINGO-OOPHORECTOMY    . MODIFIED RADICAL MASTECTOMY Right       FAMILY HISTORY: No family history on file.  The patient reports she had negative genetic testing at [facility] 2014,report not available. --The patient's father died at age 4 due to a PE, and he also had a history of colon cancer diagnosed at age 48. The patient's mother died at age 61 due to diabetes and survived a cerebral aneurysm.The patient's mother also had a history of breast cancer diagnosed at age 50.  The patient has no brothers and 1 sister. The patient's sister was diagnosed with non invasive breast cancer at age 29. There was a paternal grandmother diagnosed with breast cancer at age 62. The patient denies a family history of ovarian cancer.    GYNECOLOGIC HISTORY:  Patient's last menstrual period was 10/01/2006. Menarche: 63 years old Age at first live birth: 63 years old She is [place] P2.  She is status post hysterectomy with bilateral salpingo- oophorectomy in 2009 at  about age 64.  She never used HRT.    SOCIAL HISTORY:  Mia [name] is disabled due to her breast cancer. She used to be Surveyor, quantity for a cardiology office. Her husband, [name] is in the process of retiring. He is a Electrical engineer for a power company. The patient's daughter, [name], lives in [place] and works as a Teaching laboratory technician. The patient's second daughter, [name] is a stay at home mom and is a special needs teacher, who will soon move to [facility] [place] The patient plans on attending [employer].     ADVANCED DIRECTIVES:    HEALTH MAINTENANCE: Social History   Tobacco Use  . Smoking status: Never Smoker  . Smokeless tobacco: Never Used  Substance Use Topics  . Alcohol use: Not Currently  . Drug use: Never    Colonoscopy: 2017   PAP:  Bone density: never  Mammogram: 2017   Allergies  Allergen Reactions  . Morphine And Related Anaphylaxis    Current Outpatient Medications  Medication Sig Dispense Refill  . amlodipine-atorvastatin (CADUET) 10-10 MG tablet Take 1 tablet by mouth daily.    Marland Kitchen aspirin 81 MG chewable tablet Chew by mouth daily.    Marland Kitchen  fenofibrate (TRICOR) 145 MG tablet Take 145 mg by mouth daily.    . fluticasone (FLONASE) 50 MCG/ACT nasal spray SPRAY 2 SPRAYS INTO EACH NOSTRIL EVERY DAY 48 mL 1  . gabapentin (NEURONTIN) 300 MG capsule Take 1 capsule (300 mg total) by mouth 2 (two) times daily. Pt is only taking 2 capsules a day 180 capsule 3  . IBRANCE 125 MG tablet TAKE 1 TABLET DAILY WITH BREAKFAST. TAKE WHOLE WITH FOOD FOR 21 DAYS ON AND 7 DAYS OFF EVERY 28 DAYS 63 tablet 3  . Insulin Admin Supplies MISC Inject 24 Units into the skin daily.    . Insulin Pen Needle 31G X 5 MM MISC UAD for Sq inj for DM qd 100 each PRN  . LANTUS SOLOSTAR 100 UNIT/ML Solostar Pen NJECT 24 UNITS INTO THE SKIN DAILY 15 mL 1  . letrozole (FEMARA) 2.5 MG tablet Take 1 tablet (2.5 mg total) by mouth daily. 90 tablet 4  . loratadine  (CLARITIN) 10 MG tablet Take 10 mg by mouth daily.    Marland Kitchen losartan (COZAAR) 100 MG tablet Take 1 tablet (100 mg total) by mouth daily. 90 tablet 1  . metoprolol succinate (TOPROL-XL) 25 MG 24 hr tablet Take 1 tablet (25 mg total) by mouth daily. 90 tablet 1  . oxyCODONE (OXYCONTIN) 20 mg 12 hr tablet Take 1 tablet (20 mg total) by mouth every 12 (twelve) hours. 60 tablet 0  . oxyCODONE-acetaminophen (PERCOCET) 10-325 MG tablet Take 1 tablet by mouth every 6 (six) hours as needed for pain. 140 tablet 0  . pantoprazole (PROTONIX) 40 MG tablet Take 1 tablet (40 mg total) by mouth daily. 90 tablet 3  . pioglitazone (ACTOS) 15 MG tablet Take 1 tablet (15 mg total) by mouth daily. 90 tablet 1  . pravastatin (PRAVACHOL) 40 MG tablet Take 40 mg by mouth daily.    . sitaGLIPtin-metformin (JANUMET) 50-1000 MG tablet Take 1 tablet by mouth daily. 90 tablet 1   No current facility-administered medications for this visit.    OBJECTIVE: Middle-aged white woman who appears stated age  64:   09/29/19 1056  BP: (!) 144/88  Pulse: 84  Resp: 18  Temp: 98.5 F (36.9 C)  SpO2: 97%   Wt Readings from Last 3 Encounters:  09/29/19 220 lb 12.8 oz (100.2 kg)  07/14/19 224 lb 14.4 oz (102 kg)  05/19/19 222 lb 6.4 oz (100.9 kg)   Body mass index is 37.9 kg/m.    ECOG FS:2 - Symptomatic, <50% confined to bed  Sclerae unicteric, EOMs intact Wearing a mask No cervical or supraclavicular adenopathy Lungs no rales or rhonchi Heart regular rate and rhythm Abd soft, nontender, positive bowel sounds MSK no focal spinal tenderness, no upper extremity lymphedema Neuro: nonfocal, well oriented, appropriate affect Breasts: The right breast is status post mastectomy.  There is no evidence of chest wall recurrence.  The left breast is benign.  Both axillae are benign.   LAB RESULTS:  CMP     Component Value Date/Time   NA 138 09/29/2019 1002   NA 140 01/31/2018 0000   K 4.2 09/29/2019 1002   CL 102  09/29/2019 1002   CO2 24 09/29/2019 1002   GLUCOSE 151 (H) 09/29/2019 1002   BUN 18 09/29/2019 1002   BUN 22 (A) 01/31/2018 0000   CREATININE 1.02 (H) 09/29/2019 1002   CREATININE 1.05 (H) 03/24/2019 0833   CALCIUM 9.4 09/29/2019 1002   PROT 7.3 09/29/2019 1002   ALBUMIN 3.7 09/29/2019  1002   AST 14 (L) 09/29/2019 1002   AST 15 03/24/2019 0833   ALT 13 09/29/2019 1002   ALT 16 03/24/2019 0833   ALKPHOS 65 09/29/2019 1002   BILITOT 0.4 09/29/2019 1002   BILITOT 0.3 03/24/2019 0833   GFRNONAA 58 (L) 09/29/2019 1002   GFRNONAA 57 (L) 03/24/2019 0833   GFRAA >60 09/29/2019 1002   GFRAA >60 03/24/2019 0833    No results found for: TOTALPROTELP, ALBUMINELP, A1GS, A2GS, BETS, BETA2SER, GAMS, MSPIKE, SPEI  No results found for: KPAFRELGTCHN, LAMBDASER, KAPLAMBRATIO  Lab Results  Component Value Date   WBC 3.2 (L) 09/29/2019   NEUTROABS 2.3 09/29/2019   HGB 11.1 (L) 09/29/2019   HCT 33.9 (L) 09/29/2019   MCV 105.9 (H) 09/29/2019   PLT 417 (H) 09/29/2019    No results found for: LABCA2  No components found for: [id number]  No results for input(s): INR in the last 168 hours.  No results found for: LABCA2  No results found for: CAN199  No results found for: YTK160  No results found for: FUX323  Lab Results  Component Value Date   CA2729 108.8 (H) 08/11/2019    No components found for: HGQUANT  No results found for: CEA1 / No results found for: CEA1   No results found for: AFPTUMOR  No results found for: CHROMOGRNA  No results found for: HGBA, HGBA2QUANT, HGBFQUANT, HGBSQUAN (Hemoglobinopathy evaluation)   No results found for: LDH  No results found for: IRON, TIBC, IRONPCTSAT (Iron and TIBC)  No results found for: FERRITIN  Urinalysis No results found for: COLORURINE, APPEARANCEUR, LABSPEC, PHURINE, GLUCOSEU, HGBUR, BILIRUBINUR, KETONESUR, PROTEINUR, UROBILINOGEN, NITRITE, LEUKOCYTESUR   STUDIES: CT Chest W Contrast  Result Date: 09/01/2019 CLINICAL  DATA:  Metastatic breast cancer restaging EXAM: CT CHEST, ABDOMEN, AND PELVIS WITH CONTRAST TECHNIQUE: Multidetector CT imaging of the chest, abdomen and pelvis was performed following the standard protocol during bolus administration of intravenous contrast. CONTRAST:  152m OMNIPAQUE IOHEXOL 300 MG/ML  SOLN COMPARISON:  Multiple exams, including 03/31/2019 FINDINGS: CT CHEST FINDINGS Cardiovascular: Atherosclerosis of the aortic arch and left anterior descending coronary artery. Mediastinum/Nodes: Left paratracheal lymph node 0.8 cm in short axis on image 24/2, previously the same. No pathologic adenopathy identified. Right mastectomy. Right axillary dissection. Lungs/Pleura: Small left pleural effusion, but mildly increased in size from prior. This is nonspecific for exudative or transudative etiology. Peripheral interstitial accentuation anteriorly in the right chest likely from prior radiation therapy, not appreciably changed. Stable 3 mm left upper lobe nodule on image 60/6. Mild dependent subsegmental atelectasis in the right lower lobe. Mildly elevated left hemidiaphragm with left basilar atelectasis as well as airway plugging in the left lower lobe, similar to the previous examination. Indistinct patchy ground-glass opacities in both upper lobes are generally similar to prior, although slightly increased medially in the right upper lobe on image 68/6. Musculoskeletal: Stable appearance of scattered sclerotic osseous metastatic disease involving the sternum, thoracic spine, left scapula, and ribs. CT ABDOMEN PELVIS FINDINGS Hepatobiliary: Suspected diffuse hepatic steatosis. Gallbladder unremarkable. Pancreas: Unremarkable Spleen: Unremarkable Adrenals/Urinary Tract: Unremarkable Stomach/Bowel: Unremarkable Vascular/Lymphatic: Aortoiliac atherosclerotic vascular disease. Reproductive: Uterus absent.  Ovaries absent. Other: No supplemental non-categorized findings. Musculoskeletal: Stable osseous metastatic  disease involving the lumbar spine and pelvis, with suspected pathologic fracture through the right iliac crest unchanged, and suspected pathologic fracture through the upper margin of the left acetabulum unchanged. Dominant regions of pelvic involvement including the right iliac bone and left ischium contain both lytic and sclerotic elements.  No new or enlarging regions of bony involvement. Pathologic compression fractures at L4 and L5 appear unchanged. IMPRESSION: 1. Stable scattered osseous metastatic disease. Similar appearance of suspected pathologic fractures of the right lateral iliac crest and left upper acetabular margin. Stable pathologic fractures at L4 and L5. No new or enlarging bony lesions identified. 2. No findings of active soft tissue malignancy. 3. Small left pleural effusion, but increased in size compared to the prior exam. 4. Stable atelectasis in the left lower lobe with mild elevation the left hemidiaphragm. Post therapy related findings anteriorly in the right lung. 5. There is some faint ground-glass opacities which are mostly stable from the prior exam and likely chronic treatment effects. Subtle increased medial right upper lobe ground-glass density could reflect low-grade atypical infectious process. 6. Other imaging findings of potential clinical significance: Aortic Atherosclerosis (ICD10-I70.0). Coronary atherosclerosis. Suspected diffuse hepatic steatosis. Electronically Signed   By: Van Clines M.D.   On: 09/01/2019 11:51   CT Abdomen Pelvis W Contrast  Result Date: 09/01/2019 CLINICAL DATA:  Metastatic breast cancer restaging EXAM: CT CHEST, ABDOMEN, AND PELVIS WITH CONTRAST TECHNIQUE: Multidetector CT imaging of the chest, abdomen and pelvis was performed following the standard protocol during bolus administration of intravenous contrast. CONTRAST:  131m OMNIPAQUE IOHEXOL 300 MG/ML  SOLN COMPARISON:  Multiple exams, including 03/31/2019 FINDINGS: CT CHEST FINDINGS  Cardiovascular: Atherosclerosis of the aortic arch and left anterior descending coronary artery. Mediastinum/Nodes: Left paratracheal lymph node 0.8 cm in short axis on image 24/2, previously the same. No pathologic adenopathy identified. Right mastectomy. Right axillary dissection. Lungs/Pleura: Small left pleural effusion, but mildly increased in size from prior. This is nonspecific for exudative or transudative etiology. Peripheral interstitial accentuation anteriorly in the right chest likely from prior radiation therapy, not appreciably changed. Stable 3 mm left upper lobe nodule on image 60/6. Mild dependent subsegmental atelectasis in the right lower lobe. Mildly elevated left hemidiaphragm with left basilar atelectasis as well as airway plugging in the left lower lobe, similar to the previous examination. Indistinct patchy ground-glass opacities in both upper lobes are generally similar to prior, although slightly increased medially in the right upper lobe on image 68/6. Musculoskeletal: Stable appearance of scattered sclerotic osseous metastatic disease involving the sternum, thoracic spine, left scapula, and ribs. CT ABDOMEN PELVIS FINDINGS Hepatobiliary: Suspected diffuse hepatic steatosis. Gallbladder unremarkable. Pancreas: Unremarkable Spleen: Unremarkable Adrenals/Urinary Tract: Unremarkable Stomach/Bowel: Unremarkable Vascular/Lymphatic: Aortoiliac atherosclerotic vascular disease. Reproductive: Uterus absent.  Ovaries absent. Other: No supplemental non-categorized findings. Musculoskeletal: Stable osseous metastatic disease involving the lumbar spine and pelvis, with suspected pathologic fracture through the right iliac crest unchanged, and suspected pathologic fracture through the upper margin of the left acetabulum unchanged. Dominant regions of pelvic involvement including the right iliac bone and left ischium contain both lytic and sclerotic elements. No new or enlarging regions of bony  involvement. Pathologic compression fractures at L4 and L5 appear unchanged. IMPRESSION: 1. Stable scattered osseous metastatic disease. Similar appearance of suspected pathologic fractures of the right lateral iliac crest and left upper acetabular margin. Stable pathologic fractures at L4 and L5. No new or enlarging bony lesions identified. 2. No findings of active soft tissue malignancy. 3. Small left pleural effusion, but increased in size compared to the prior exam. 4. Stable atelectasis in the left lower lobe with mild elevation the left hemidiaphragm. Post therapy related findings anteriorly in the right lung. 5. There is some faint ground-glass opacities which are mostly stable from the prior exam and likely chronic treatment  effects. Subtle increased medial right upper lobe ground-glass density could reflect low-grade atypical infectious process. 6. Other imaging findings of potential clinical significance: Aortic Atherosclerosis (ICD10-I70.0). Coronary atherosclerosis. Suspected diffuse hepatic steatosis. Electronically Signed   By: Van Clines M.D.   On: 09/01/2019 11:51     ELIGIBLE FOR AVAILABLE RESEARCH PROTOCOL: no   ASSESSMENT: 63 y.o. [place], [place] woman with stage IV breast cancer, as follows:  (1) status post right mastectomy in 2000, for a pT2 pN2, stage IIIA invasive ductal carcinoma, estrogen receptor positive, progesterone receptor not tested, HER-2 negative (0) by immunohistochemistry  (a) adjuvant chemotherapy with doxorubicin and cyclophosphamide in dose dense fashion x4 followed by paclitaxel in dose dense fashion x4  (b) adjuvant radiation: 30 doses  (c) antiestrogens: Tamoxifen for 5 years, completed 2005  (2) METASTASTIC DISEASE: 2008, involving bones, lungs, and lymph nodes  (a) CA 27-29 is informative  (b) CT of the chest abdomen and pelvis in [facility] finds stable sclerotic metastases (T8, T10, T11, sternum, L4, L5); 0.6 cm left upper lobe lung  nodule stable  (3)  prior anti-estrogen treatments:  (a) fulvestrant--progression  (b) exemestane/everolimus--hyperlipidemia, hyperglycemia  (4) prior chemotherapy treatments:  (a) capecitabine: progression  (b) Abraxane, August 2014 through 11/18/2015: good response but stopped due to neuropathy  (c) gemcitabine 01/20/2016--with multiple interruptions secondary to infections  (5) radiation therapy:  (a) T spine and Right femur, completed 01/10/2016  (6) letrozole/ palbociclib started 07/13/2016  (7) bone treatment:  (a) denosumab/Xgeva--discontinued January 2016 due to osteonecrosis of the jaw  (8) cancer associated pain:  (a) oxycodone/APAP 10/325 QID as needed  (b) PMP Aware reviewed 09/28/2019  (c) OxyContin 20 mg p.o. twice daily added 09/29/2019  (9) restaging studies:  (a) mostly stable bone lesions with evidence of progression at L3-L5; no epidural tumor by total spinal MRI in November 2019  (b) no evidence of progressive lung, lymph node or liver lesions by CT scans of the chest abdomen and pelvis and bone scan December 2019  (c) CT scans abd/pelvis 03/31/2019 shows stable bone metastases, no extraosseous disease  (d) MRI pelvis 04/09/2019 shows bilateral ilium, left acetabulum and lumbar mets, stable  (e) ct abd/pelvis 09/01/2019 shows stable bone mets  (10) palliative radiation 11/18/2018 through 12/02/2018 Site/dose:   1. Lumbar spine; 35 Gy in 14 fractions of 2.5 Gy                      2. Pelvis; 35 Gy in 14 fractions of 2.5 Gy   PLAN: I reviewed the results of the CT scan with Mia [name].  We essentially have stable disease.  At some point we may want to resume denosumab/Xgeva but right now we are going to continue what we are doing.  She is tolerating the letrozole/palbociclib quite well and I am making no changes in those doses.  I think she may do better if we go with long-acting pain medicine.  We are starting OxyContin 20 mg twice daily.  She may use the  oxycodone at 10 mg as needed for breakthrough pain.  We discussed this in great detail and she understands at the goal here is to make her more functional and active, not to have no pain.  If she starts getting more sleepy then that is too much pain medication  Because of this change I am going to see her again in 1 month.  We will also consider adding an antidepressant at that point.  Otherwise we will resume  every 9-monthvisits as before  She knows to call for any other problem that may develop before the next visit.  Jurrell Royster, GVirgie Dad MD  09/29/19 5:57 PM Medical Oncology and H[facility]ology [facility] [place] [id number]. [phone]   Fax. [phone]  I, KWilburn Mylar am acting as scribe for Dr. GVirgie [name] Tomi Grandpre.  I, GLurline DelMD, have reviewed the above documentation for accuracy and completeness, and I agree with the above.

## 2019-09-29 ENCOUNTER — Inpatient Hospital Stay: Payer: MEDICARE | Attending: Oncology | Admitting: Oncology

## 2019-09-29 ENCOUNTER — Other Ambulatory Visit: Payer: Self-pay

## 2019-09-29 ENCOUNTER — Inpatient Hospital Stay: Payer: MEDICARE

## 2019-09-29 VITALS — BP 144/88 | HR 84 | Temp 98.5°F | Resp 18 | Ht 64.0 in | Wt 220.8 lb

## 2019-09-29 DIAGNOSIS — Z923 Personal history of irradiation: Secondary | ICD-10-CM | POA: Diagnosis not present

## 2019-09-29 DIAGNOSIS — G893 Neoplasm related pain (acute) (chronic): Secondary | ICD-10-CM | POA: Insufficient documentation

## 2019-09-29 DIAGNOSIS — T451X5A Adverse effect of antineoplastic and immunosuppressive drugs, initial encounter: Secondary | ICD-10-CM

## 2019-09-29 DIAGNOSIS — C7951 Secondary malignant neoplasm of bone: Secondary | ICD-10-CM

## 2019-09-29 DIAGNOSIS — Z9011 Acquired absence of right breast and nipple: Secondary | ICD-10-CM | POA: Diagnosis not present

## 2019-09-29 DIAGNOSIS — M879 Osteonecrosis, unspecified: Secondary | ICD-10-CM

## 2019-09-29 DIAGNOSIS — Z9221 Personal history of antineoplastic chemotherapy: Secondary | ICD-10-CM | POA: Insufficient documentation

## 2019-09-29 DIAGNOSIS — C50911 Malignant neoplasm of unspecified site of right female breast: Secondary | ICD-10-CM | POA: Insufficient documentation

## 2019-09-29 DIAGNOSIS — Z79811 Long term (current) use of aromatase inhibitors: Secondary | ICD-10-CM | POA: Diagnosis not present

## 2019-09-29 DIAGNOSIS — G620 Drug-induced polyneuropathy: Secondary | ICD-10-CM | POA: Diagnosis not present

## 2019-09-29 DIAGNOSIS — C7800 Secondary malignant neoplasm of unspecified lung: Secondary | ICD-10-CM

## 2019-09-29 DIAGNOSIS — Z170 Estrogen receptor positive status [ER+]: Secondary | ICD-10-CM | POA: Diagnosis not present

## 2019-09-29 DIAGNOSIS — C50919 Malignant neoplasm of unspecified site of unspecified female breast: Secondary | ICD-10-CM | POA: Diagnosis not present

## 2019-09-29 DIAGNOSIS — Z7189 Other specified counseling: Secondary | ICD-10-CM

## 2019-09-29 LAB — COMPREHENSIVE METABOLIC PANEL
ALT: 13 U/L (ref 0–44)
AST: 14 U/L — ABNORMAL LOW (ref 15–41)
Albumin: 3.7 g/dL (ref 3.5–5.0)
Alkaline Phosphatase: 65 U/L (ref 38–126)
Anion gap: 12 (ref 5–15)
BUN: 18 mg/dL (ref 8–23)
CO2: 24 mmol/L (ref 22–32)
Calcium: 9.4 mg/dL (ref 8.9–10.3)
Chloride: 102 mmol/L (ref 98–111)
Creatinine, Ser: 1.02 mg/dL — ABNORMAL HIGH (ref 0.44–1.00)
GFR calc Af Amer: >60 mL/min (ref >60)
GFR calc non Af Amer: 58 mL/min — ABNORMAL LOW (ref >60)
Glucose, Bld: 151 mg/dL — ABNORMAL HIGH (ref 70–99)
Potassium: 4.2 mmol/L (ref 3.5–5.1)
Sodium: 138 mmol/L (ref 135–145)
Total Bilirubin: 0.4 mg/dL (ref 0.3–1.2)
Total Protein: 7.3 g/dL (ref 6.5–8.1)

## 2019-09-29 LAB — CBC WITH DIFFERENTIAL/PLATELET
Abs Immature Granulocytes: 0.02 10*3/uL (ref 0.00–0.07)
Basophils Absolute: 0.1 10*3/uL (ref 0.0–0.1)
Basophils Relative: 3 %
Eosinophils Absolute: 0 10*3/uL (ref 0.0–0.5)
Eosinophils Relative: 1 %
HCT: 33.9 % — ABNORMAL LOW (ref 36.0–46.0)
Hemoglobin: 11.1 g/dL — ABNORMAL LOW (ref 12.0–15.0)
Immature Granulocytes: 1 %
Lymphocytes Relative: 15 %
Lymphs Abs: 0.5 10*3/uL — ABNORMAL LOW (ref 0.7–4.0)
MCH: 34.7 pg — ABNORMAL HIGH (ref 26.0–34.0)
MCHC: 32.7 g/dL (ref 30.0–36.0)
MCV: 105.9 fL — ABNORMAL HIGH (ref 80.0–100.0)
Monocytes Absolute: 0.2 10*3/uL (ref 0.1–1.0)
Monocytes Relative: 7 %
Neutro Abs: 2.3 10*3/uL (ref 1.7–7.7)
Neutrophils Relative %: 73 %
Platelets: 417 10*3/uL — ABNORMAL HIGH (ref 150–400)
RBC: 3.2 MIL/uL — ABNORMAL LOW (ref 3.87–5.11)
RDW: 14.4 % (ref 11.5–15.5)
WBC: 3.2 10*3/uL — ABNORMAL LOW (ref 4.0–10.5)
nRBC: 0 % (ref 0.0–0.2)

## 2019-09-29 MED ORDER — OXYCODONE-ACETAMINOPHEN 10-325 MG PO TABS: 1.0000 | ORAL_TABLET | Freq: Four times a day (QID) | ORAL | 0 refills | Status: DC | PRN

## 2019-09-29 MED ORDER — OXYCODONE HCL ER 20 MG PO T12A: 20.0000 mg | EXTENDED_RELEASE_TABLET | Freq: Two times a day (BID) | ORAL | 0 refills | Status: DC

## 2019-09-29 MED ORDER — LETROZOLE 2.5 MG PO TABS: 2.5000 mg | ORAL_TABLET | Freq: Every day | ORAL | 4 refills | Status: DC

## 2019-09-29 MED ORDER — IBRANCE 125 MG PO TABS: ORAL_TABLET | ORAL | 3 refills | Status: DC

## 2019-09-30 LAB — CANCER ANTIGEN 27.29: CA 27.29: 124.4 U/mL — ABNORMAL HIGH (ref 0.0–38.6)

## 2019-10-05 ENCOUNTER — Other Ambulatory Visit: Payer: Self-pay

## 2019-10-05 NOTE — Telephone Encounter (Signed)
Last OV 03/17/19   Last fill 03/17/19  #90/1

## 2019-10-06 MED ORDER — PIOGLITAZONE HCL 15 MG PO TABS: 15.0000 mg | ORAL_TABLET | Freq: Every day | ORAL | 1 refills | Status: DC

## 2019-10-07 ENCOUNTER — Encounter: Payer: Self-pay | Admitting: Family Medicine

## 2019-10-07 MED ORDER — SITAGLIPTIN PHOS-METFORMIN HCL 50-1000 MG PO TABS: 1.0000 | ORAL_TABLET | Freq: Every day | ORAL | 1 refills | Status: DC

## 2019-10-07 NOTE — Telephone Encounter (Signed)
Yes I agree let's do a virtual visit and I will send in this refill.  Great job, [employer]!

## 2019-10-07 NOTE — Telephone Encounter (Signed)
Please see message and advise.  Thank you. ° °

## 2019-10-08 NOTE — Telephone Encounter (Signed)
Pt is scheduled for Monday

## 2019-10-11 NOTE — Progress Notes (Signed)
Virtual Visit via Video   Due to the COVID-19 pandemic, this visit was completed with telemedicine (audio/video) technology to reduce patient and provider exposure as well as to preserve personal protective equipment.   I connected with Mia [name] by a video enabled telemedicine application and verified that I am speaking with the correct person using two identifiers. Location patient: Home Location provider:  [facility], Office Persons participating in the virtual visit: Mia [name], Arnette Norris, MD   I discussed the limitations of evaluation and management by telemedicine and the availability of in person appointments. The patient expressed understanding and agreed to proceed.  Care Team   Patient Care Team: Lucille Passy, MD as PCP - General (Family Medicine) Magrinat, Virgie Dad, MD as Consulting Physician (Oncology)  Subjective:   HPI:   BP issues- has been fluctuated.  Pt sent the following message:  "Good morning Dr Deborra Medina and staff. I have 2 situations i would like to call attention.  First, I noticed my Janumet wasn't lasting thru the month and I checked the bottle. It says to take 1 tablet each day.  I have always taken 2 tablets daily.  Has this changed or is there a mixup with [facility]? Also,  my blood pressure has been fluctuating.  It is high 150's/ 90's to 100's in the morning and again around suppertime and all evening and in the afternoon is normal range of 120'/70's to 80's.  I usually have a headache when I wake up and it goes away by afternoon.  This has been going on for quite awhile now.  Also want to report that last week Dr [name] changed my pain meds to Oxycontin 20mg  twice a day with oxycodone as needed.  Thanks so much for helping me with this.  Praying for you all to stay safe.  Mia [name]"  Currently taking Toprol XL 25 mg daily, Cozaar 100 mg daily and amlodipine 10 mg daily.   Review of Systems  Constitutional: Negative.   HENT:  Negative.   Eyes: Negative.   Respiratory: Negative.   Cardiovascular: Negative.   Gastrointestinal: Negative.   Genitourinary: Negative.   Musculoskeletal: Negative.   Skin: Negative.   Neurological: Positive for headaches. Negative for dizziness, tingling, tremors, sensory change, speech change, focal weakness, seizures, loss of consciousness and weakness.  Endo/Heme/Allergies: Negative.   Psychiatric/Behavioral: Negative.   All other systems reviewed and are negative.    Patient Active Problem List   Diagnosis Date Noted  . Goals of care, counseling/discussion 09/29/2019  . Paronychia of thumb, left 02/03/2019  . Drug-induced neutropenia ([place]) 12/04/2018  . HTN (hypertension) 06/03/2018  . HLD (hyperlipidemia) 06/03/2018  . Metastatic breast cancer ([place]) 03/12/2018  . Bone metastases ([place]) 03/12/2018  . Lung metastases ([place]) 03/12/2018  . Osteonecrosis ([place]) 03/12/2018  . Peripheral neuropathy due to chemotherapy ([place]) 03/12/2018  . Diabetes mellitus type 2 in obese ([place]) 03/12/2018  . Hepatic steatosis 03/12/2018  . Aortic atherosclerosis ([place]) 03/12/2018    Social History   Tobacco Use  . Smoking status: Never Smoker  . Smokeless tobacco: Never Used  Substance Use Topics  . Alcohol use: Not Currently    Current Outpatient Medications:  .  amlodipine-atorvastatin (CADUET) 10-10 MG tablet, Take 1 tablet by mouth daily., Disp: , Rfl:  .  aspirin 81 MG chewable tablet, Chew by mouth daily., Disp: , Rfl:  .  fenofibrate (TRICOR) 145 MG tablet, Take 145 mg by mouth daily., Disp: , Rfl:  .  fluticasone (  FLONASE) 50 MCG/ACT nasal spray, SPRAY 2 SPRAYS INTO EACH NOSTRIL EVERY DAY, Disp: 48 mL, Rfl: 1 .  gabapentin (NEURONTIN) 300 MG capsule, Take 1 capsule (300 mg total) by mouth 2 (two) times daily. Pt is only taking 2 capsules a day, Disp: 180 capsule, Rfl: 3 .  IBRANCE 125 MG tablet, TAKE 1 TABLET DAILY WITH BREAKFAST. TAKE WHOLE WITH FOOD FOR 21 DAYS ON AND 7 DAYS OFF  EVERY 28 DAYS, Disp: 63 tablet, Rfl: 3 .  Insulin Admin Supplies MISC, Inject 24 Units into the skin daily., Disp: , Rfl:  .  Insulin Pen Needle 31G X 5 MM MISC, UAD for Sq inj for DM qd, Disp: 100 each, Rfl: PRN .  LANTUS SOLOSTAR 100 UNIT/ML Solostar Pen, NJECT 24 UNITS INTO THE SKIN DAILY, Disp: 15 mL, Rfl: 1 .  letrozole (FEMARA) 2.5 MG tablet, Take 1 tablet (2.5 mg total) by mouth daily., Disp: 90 tablet, Rfl: 4 .  loratadine (CLARITIN) 10 MG tablet, Take 10 mg by mouth daily., Disp: , Rfl:  .  losartan (COZAAR) 50 MG tablet, Take 1 tablet (50 mg total) by mouth 2 (two) times daily., Disp: 60 tablet, Rfl: 2 .  metoprolol succinate (TOPROL-XL) 25 MG 24 hr tablet, Take 1 tablet (25 mg total) by mouth daily., Disp: 90 tablet, Rfl: 1 .  oxyCODONE (OXYCONTIN) 20 mg 12 hr tablet, Take 1 tablet (20 mg total) by mouth every 12 (twelve) hours., Disp: 60 tablet, Rfl: 0 .  oxyCODONE-acetaminophen (PERCOCET) 10-325 MG tablet, Take 1 tablet by mouth every 6 (six) hours as needed for pain., Disp: 140 tablet, Rfl: 0 .  pantoprazole (PROTONIX) 40 MG tablet, Take 1 tablet (40 mg total) by mouth daily., Disp: 90 tablet, Rfl: 3 .  pioglitazone (ACTOS) 15 MG tablet, Take 1 tablet (15 mg total) by mouth daily., Disp: 90 tablet, Rfl: 1 .  pravastatin (PRAVACHOL) 40 MG tablet, Take 40 mg by mouth daily., Disp: , Rfl:  .  sitaGLIPtin-metformin (JANUMET) 50-1000 MG tablet, Take 1 tablet by mouth daily., Disp: 90 tablet, Rfl: 1  Allergies  Allergen Reactions  . Morphine And Related Anaphylaxis    Objective:  BP (!) 132/120   Pulse (!) 101   LMP 10/01/2006 Comment: Total Hysterectomy  VITALS: Per patient if applicable, see vitals. GENERAL: Alert, appears well and in no acute distress. HEENT: Atraumatic, conjunctiva clear, no obvious abnormalities on inspection of external nose and ears. NECK: Normal movements of the head and neck. CARDIOPULMONARY: No increased WOB. Speaking in clear sentences. I:E ratio WNL.    MS: Moves all visible extremities without noticeable abnormality. PSYCH: Pleasant and cooperative, well-groomed. Speech normal rate and rhythm. Affect is appropriate. Insight and judgement are appropriate. Attention is focused, linear, and appropriate.  NEURO: CN grossly intact. Oriented as arrived to appointment on time with no prompting. Moves both UE equally.  SKIN: No obvious lesions, wounds, erythema, or cyanosis noted on face or hands.  Depression screen [facility] 2/9 09/16/2019 06/03/2018  Decreased Interest 0 0  Down, Depressed, Hopeless 0 0  PHQ - 2 Score 0 0     . COVID-19 Education: The signs and symptoms of COVID-19 were discussed with the patient and how to seek care for testing if needed. The importance of social distancing was discussed today. . Reviewed expectations re: course of current medical issues. . Discussed self-management of symptoms. . Outlined signs and symptoms indicating need for more acute intervention. . Patient verbalized understanding and all questions were answered. Marland Kitchen  Health Maintenance issues including appropriate healthy diet, exercise, and smoking avoidance were discussed with patient. . See orders for this visit as documented in the electronic medical record.  Arnette Norris, MD  Records requested if needed. Time spent: 25 minutes, of which >50% was spent in obtaining information about her symptoms, reviewing her previous labs, evaluations, and treatments, counseling her about her condition (please see the discussed topics above), and developing a plan to further investigate it; she had a number of questions which I addressed.   Lab Results  Component Value Date   WBC 3.2 (L) 09/29/2019   HGB 11.1 (L) 09/29/2019   HCT 33.9 (L) 09/29/2019   PLT 417 (H) 09/29/2019   GLUCOSE 151 (H) 09/29/2019   CHOL 168 06/03/2018   TRIG 163.0 (H) 06/03/2018   HDL 49.70 06/03/2018   LDLCALC 85 06/03/2018   ALT 13 09/29/2019   AST 14 (L) 09/29/2019   NA 138 09/29/2019    K 4.2 09/29/2019   CL 102 09/29/2019   CREATININE 1.02 (H) 09/29/2019   BUN 18 09/29/2019   CO2 24 09/29/2019   TSH 2.02 06/03/2018   HGBA1C 6.1 06/18/2019    Lab Results  Component Value Date   TSH 2.02 06/03/2018   Lab Results  Component Value Date   WBC 3.2 (L) 09/29/2019   HGB 11.1 (L) 09/29/2019   HCT 33.9 (L) 09/29/2019   MCV 105.9 (H) 09/29/2019   PLT 417 (H) 09/29/2019   Lab Results  Component Value Date   NA 138 09/29/2019   K 4.2 09/29/2019   CO2 24 09/29/2019   GLUCOSE 151 (H) 09/29/2019   BUN 18 09/29/2019   CREATININE 1.02 (H) 09/29/2019   BILITOT 0.4 09/29/2019   ALKPHOS 65 09/29/2019   AST 14 (L) 09/29/2019   ALT 13 09/29/2019   PROT 7.3 09/29/2019   ALBUMIN 3.7 09/29/2019   CALCIUM 9.4 09/29/2019   ANIONGAP 12 09/29/2019   Lab Results  Component Value Date   CHOL 168 06/03/2018   Lab Results  Component Value Date   HDL 49.70 06/03/2018   Lab Results  Component Value Date   LDLCALC 85 06/03/2018   Lab Results  Component Value Date   TRIG 163.0 (H) 06/03/2018   Lab Results  Component Value Date   CHOLHDL 3 06/03/2018   Lab Results  Component Value Date   HGBA1C 6.1 06/18/2019       Assessment & Plan:   Problem List Items Addressed This Visit      Active Problems   HTN (hypertension) - Primary    Pt sent the following message:   "Good morning Dr Deborra Medina and staff. I have 2 situations i would like to call attention.  First, I noticed my Janumet wasn't lasting thru the month and I checked the bottle. It says to take 1 tablet each day.  I have always taken 2 tablets daily.  Has this changed or is there a mixup with [facility]? Also,  my blood pressure has been fluctuating.  It is high 150's/ 90's to 100's in the morning and again around suppertime and all evening and in the afternoon is normal range of 120'/70's to 80's.  I usually have a headache when I wake up and it goes away by afternoon.  This has been going on for quite awhile now.  Also  want to report that last week Dr [name] changed my pain meds to Oxycontin 20mg  twice a day with oxycodone as needed.  Thanks  so much for helping me with this.  Praying for you all to stay safe.  Natasha Mead"  History: Review: taking medications as instructed, no medication side effects noted, no TIAs, no chest pain on exertion, no dyspnea on exertion, no swelling of ankles. Smoker: No. BP elevated today but has not had medications yet this morning.  BP Readings from Last 3 Encounters:  10/12/19 (!) 132/120  09/29/19 (!) 144/88  07/14/19 121/82   Lab Results  Component Value Date   CREATININE 1.02 (H) 09/29/2019     Assessment/Plan: 1. Medication: continue Toprol XL every morning, amlodipine 10 mg daily every morning, change cozaar 100 mg daily to cozaar 50 mg twice daily (second dose around lunch).. 2. Dietary sodium restriction. 3. Regular aerobic exercise. 4. Check blood pressures 3 times daily and record.        Relevant Medications   losartan (COZAAR) 50 MG tablet      I have changed Nunzio Cory Alas's losartan. I am also having her maintain her fenofibrate, pravastatin, aspirin, Insulin Admin Supplies, amlodipine-atorvastatin, loratadine, Insulin Pen Needle, metoprolol succinate, gabapentin, pantoprazole, Lantus SoloStar, fluticasone, oxyCODONE, letrozole, oxyCODONE-acetaminophen, Ibrance, pioglitazone, and sitaGLIPtin-metformin.  Meds ordered this encounter  Medications  . losartan (COZAAR) 50 MG tablet    Sig: Take 1 tablet (50 mg total) by mouth 2 (two) times daily.    Dispense:  60 tablet    Refill:  2     Arnette Norris, MD

## 2019-10-12 ENCOUNTER — Encounter: Payer: Self-pay | Admitting: Family Medicine

## 2019-10-12 ENCOUNTER — Telehealth (INDEPENDENT_AMBULATORY_CARE_PROVIDER_SITE_OTHER): Payer: MEDICARE | Admitting: Family Medicine

## 2019-10-12 ENCOUNTER — Other Ambulatory Visit: Payer: Self-pay

## 2019-10-12 VITALS — BP 132/120 | HR 101

## 2019-10-12 DIAGNOSIS — I10 Essential (primary) hypertension: Secondary | ICD-10-CM | POA: Diagnosis not present

## 2019-10-12 DIAGNOSIS — E1169 Type 2 diabetes mellitus with other specified complication: Secondary | ICD-10-CM | POA: Diagnosis not present

## 2019-10-12 DIAGNOSIS — E669 Obesity, unspecified: Secondary | ICD-10-CM | POA: Diagnosis not present

## 2019-10-12 MED ORDER — SITAGLIPTIN PHOS-METFORMIN HCL 50-1000 MG PO TABS: 1.0000 | ORAL_TABLET | Freq: Every day | ORAL | 3 refills | Status: DC

## 2019-10-12 MED ORDER — LOSARTAN POTASSIUM 50 MG PO TABS: 50.0000 mg | ORAL_TABLET | Freq: Two times a day (BID) | ORAL | 2 refills | Status: DC

## 2019-10-12 NOTE — Assessment & Plan Note (Addendum)
Pt sent the following message:   "Good morning Dr Deborra Medina and staff. I have 2 situations i would like to call attention.  First, I noticed my Janumet wasn't lasting thru the month and I checked the bottle. It says to take 1 tablet each day.  I have always taken 2 tablets daily.  Has this changed or is there a mixup with [facility]? Also,  my blood pressure has been fluctuating.  It is high 150's/ 90's to 100's in the morning and again around suppertime and all evening and in the afternoon is normal range of 120'/70's to 80's.  I usually have a headache when I wake up and it goes away by afternoon.  This has been going on for quite awhile now.  Also want to report that last week Dr [name] changed my pain meds to Oxycontin 20mg  twice a day with oxycodone as needed.  Thanks so much for helping me with this.  Praying for you all to stay safe.  Mia [name]"  History: Review: taking medications as instructed, no medication side effects noted, no TIAs, no chest pain on exertion, no dyspnea on exertion, no swelling of ankles. Smoker: No. BP elevated today but has not had medications yet this morning.  BP Readings from Last 3 Encounters:  10/12/19 (!) 132/120  09/29/19 (!) 144/88  07/14/19 121/82   Lab Results  Component Value Date   CREATININE 1.02 (H) 09/29/2019     Assessment/Plan: 1. Medication: continue Toprol XL every morning, amlodipine 10 mg daily every morning, change cozaar 100 mg daily to cozaar 50 mg twice daily (second dose around lunch).. 2. Dietary sodium restriction. 3. Regular aerobic exercise. 4. Check blood pressures 3 times daily and record.

## 2019-10-12 NOTE — Addendum Note (Signed)
Addended by: Lucille Passy on: 10/12/2019 09:56 AM   Modules accepted: Orders

## 2019-10-13 ENCOUNTER — Other Ambulatory Visit: Payer: MEDICARE

## 2019-10-13 NOTE — Addendum Note (Signed)
Addended by: Lynnea Ferrier on: 10/13/2019 12:09 PM   Modules accepted: Orders

## 2019-10-15 ENCOUNTER — Other Ambulatory Visit: Payer: Self-pay

## 2019-10-16 ENCOUNTER — Other Ambulatory Visit: Payer: MEDICARE

## 2019-10-16 ENCOUNTER — Other Ambulatory Visit: Payer: Self-pay

## 2019-10-16 DIAGNOSIS — E1169 Type 2 diabetes mellitus with other specified complication: Secondary | ICD-10-CM

## 2019-10-16 DIAGNOSIS — E669 Obesity, unspecified: Secondary | ICD-10-CM

## 2019-10-22 ENCOUNTER — Other Ambulatory Visit: Payer: Self-pay

## 2019-10-22 MED ORDER — LOSARTAN POTASSIUM 50 MG PO TABS: 50.0000 mg | ORAL_TABLET | Freq: Two times a day (BID) | ORAL | 1 refills | Status: DC

## 2019-10-27 ENCOUNTER — Other Ambulatory Visit: Payer: Self-pay

## 2019-10-27 MED ORDER — LOSARTAN POTASSIUM 50 MG PO TABS: 50.0000 mg | ORAL_TABLET | Freq: Two times a day (BID) | ORAL | 1 refills | Status: DC

## 2019-10-27 NOTE — Progress Notes (Signed)
[place]  [phone] Fax:([phone]) [phone]    ID: Mia [name] DOB: [birth year]  MR#: [id number]  BSW#:[id number]  Patient Care Team: Lucille Passy, MD as PCP - General (Family Medicine) Vermon Grays, Virgie Dad, MD as Consulting Physician (Oncology) [name], [name] PA   CHIEF COMPLAINT: Estrogen receptor positive stage IV breast cancer (s/p right mastectomy)  CURRENT TREATMENT: Letrozole, palbociclib   INTERVAL HISTORY: Mia [name] returns today for follow-up and treatment of her etrogen receptor positive stage IV breast cancer.    At the last visit we started her on OxyContin 20 mg twice daily with Percocet as needed.  She is doing much better on this regimen than the prior.  She is walking a lot more, goes outside to walk, has a lot less pain than before in general.  She only takes Percocet once a day most days.  She takes the OxyContin usually around 9:30 in the morning and 9:30 in the evening.  She continues on letrozole.  She has occasional mild hot flashes at night.  She also continues on palbociclib.  She is tolerating this with no unusual side effects that she is aware of.  There is no bone density screening on file.  She is not receiving denosumab/Xgeva or zoledronate because of her history of osteonecrosis of the jaw.    We are following her CA 27-29. Lab Results  Component Value Date   CA2729 124.4 (H) 09/29/2019   CA2729 108.8 (H) 08/11/2019   CA2729 108.3 (H) 07/14/2019   CA2729 84.9 (H) 06/16/2019   CA2729 92.2 (H) 05/19/2019     REVIEW OF SYSTEMS: Mia [name] went to [facility] area to visit her daughter and then had the full holidays here with her other daughter.  She tells me the daughter at [employer] is going to be out of the country for 3years working as a guard in various [employer]'s.  Aside from these issues Mia [name] has mild constipation from the narcotics but she takes MiraLAX most days.  She is developing  a sinus infection.  She does not have a fever yet but it is getting there.  She is taking Allegra daily and Flonase daily.  She denies cough, phlegm production, or pleurisy.  It has not helped very much.  A detailed review of systems was otherwise stable   HISTORY OF CURRENT ILLNESS: From the original intake note:  We have reviewed the medical records from [place], which is the source of the information below:  Mia [name] was initially diagnosed with Stage IIIA (T2N2M0) invasive ductal carcinoma, estrogen receptor positive and HER2 negative right breast cancer in 2000. She underwent right mastectomy, with 4 positive metastatic lymph nodes. She had chemotherapy with taxol and cytoxan and fulvestrant in the past. She had radiation and completed 5 years of tamoxifen.   She was diagnosed with Stage IV disease 04/18/2005 with metastases to the bone, lung, and additional nodes. She was on capecitabine but was discontinued after rising CA 27-29 and scans.   She started anastrozole and everolimus with Delton See in April 2014. She tolerated this well, but this was discontinued in July 2014 due to abnormal blood tests, hyperlipidemia and hyperglycemia.  She began Abraxane 172m /m2 and Xgeva in August 2014 weekly x3 with 1 week off. She tolerated this treatment well. She discontinued Xgeva January 2016 due to osteonecrosis of the jaw and mouth issues. She continued abraxane alone until her last dose on 11/18/2015 due to neuropathy and disease progression  with restaging studies on 11/23/2015 with a CT chest abdomen and pelvis and one scan showing skull, sternal and femoral lesions.   She was switched to gemcitabine starting 01/20/2016 weekly x3 and 1 week off. This was discontinued on 06/15/2016 due to a port related jugular clot on the right side. She was given Lovenox for 3 months.  She was switched to Letrozole 2.5 mg and palbociclib 125 mg starting 07/13/2016. She tolerated this well.  Restaging studies with CT chest abdomen and pelvis and bone scan on 07/01/2017 showed stable/ improving lesions. CA 27-29 was stable.  During the last few months of follow up in [place], she completed restaging studies with a CT chest abdomen and pelvis at [facility] on 12/17/2017 showing: A 6 mm pulmonary nodule in the left upper lobe laterally. Progression of metastatic disease to the bones at the L4 and L5 vertebral bodies. Sclerotic metastases at T8, T10, an T11, are similar to previous exams. Sclerotic metastases in the sternum stable. Hepatic steatosis. Aortic atherosclerosis.  Most recent CA-43-29 (January 2019) was 58.  Most recent hemoglobin A1c was 7.1 according to the patient  The patient's subsequent history is as detailed below.   PAST MEDICAL HISTORY: Past Medical History:  Diagnosis Date  . Breast cancer metastasized to bone ([place])   . Hyperlipidemia   . Hypertension   . Lymphedema of right arm   . Neuropathy associated with cancer ([place])   . Obesity (BMI 35.0-39.9 without comorbidity)      PAST SURGICAL HISTORY: Past Surgical History:  Procedure Laterality Date  . CATARACT EXTRACTION Left   . HYSTERECTOMY ABDOMINAL WITH SALPINGO-OOPHORECTOMY    . MODIFIED RADICAL MASTECTOMY Right       FAMILY HISTORY: No family history on file.  The patient reports she had negative genetic testing at [facility] 2014,report not available. --The patient's father died at age 47 due to a PE, and he also had a history of colon cancer diagnosed at age 90. The patient's mother died at age 68 due to diabetes and survived a cerebral aneurysm.The patient's mother also had a history of breast cancer diagnosed at age 61.  The patient has no brothers and 1 sister. The patient's sister was diagnosed with non invasive breast cancer at age 64. There was a paternal grandmother diagnosed with breast cancer at age 54. The patient denies a family history of ovarian cancer.    GYNECOLOGIC  HISTORY:  Patient's last menstrual period was 10/01/2006. Menarche: 64 years old Age at first live birth: 64 years old She is Mia [name].  She is status post hysterectomy with bilateral salpingo- oophorectomy in 2009 at about age 59.  She never used HRT.    SOCIAL HISTORY:  Mia [name] is disabled due to her breast cancer. She used to be Surveyor, quantity for a cardiology office. Her husband, [name] is in the process of retiring. He is a Electrical engineer for a power company. The patient's daughter, [name], lives in [place] and works as a Teaching laboratory technician. The patient's second daughter, [name] is a stay at home mom and is a special needs teacher, who will soon move to [facility] The patient plans on attending [employer].     ADVANCED DIRECTIVES:    HEALTH MAINTENANCE: Social History   Tobacco Use  . Smoking status: Never Smoker  . Smokeless tobacco: Never Used  Substance Use Topics  . Alcohol use: Not Currently  . Drug use: Never    Colonoscopy: 2017  PAP:  Bone density: never  Mammogram: 2017   Allergies  Allergen Reactions  . Morphine And Related Anaphylaxis    Current Outpatient Medications  Medication Sig Dispense Refill  . amlodipine-atorvastatin (CADUET) 10-10 MG tablet Take 1 tablet by mouth daily.    Marland Kitchen aspirin 81 MG chewable tablet Chew by mouth daily.    . fenofibrate (TRICOR) 145 MG tablet Take 145 mg by mouth daily.    . fluticasone (FLONASE) 50 MCG/ACT nasal spray SPRAY 2 SPRAYS INTO EACH NOSTRIL EVERY DAY 48 mL 1  . gabapentin (NEURONTIN) 300 MG capsule Take 1 capsule (300 mg total) by mouth 2 (two) times daily. Pt is only taking 2 capsules a day 180 capsule 3  . IBRANCE 125 MG tablet TAKE 1 TABLET DAILY WITH BREAKFAST. TAKE WHOLE WITH FOOD FOR 21 DAYS ON AND 7 DAYS OFF EVERY 28 DAYS 63 tablet 3  . Insulin Admin Supplies MISC Inject 24 Units into the skin daily.    . Insulin Pen Needle 31G X 5 MM MISC UAD for Sq inj for DM  qd 100 each PRN  . LANTUS SOLOSTAR 100 UNIT/ML Solostar Pen NJECT 24 UNITS INTO THE SKIN DAILY 15 mL 1  . letrozole (FEMARA) 2.5 MG tablet Take 1 tablet (2.5 mg total) by mouth daily. 90 tablet 4  . loratadine (CLARITIN) 10 MG tablet Take 10 mg by mouth daily.    Marland Kitchen losartan (COZAAR) 50 MG tablet Take 1 tablet (50 mg total) by mouth 2 (two) times daily. 180 tablet 1  . metoprolol succinate (TOPROL-XL) 25 MG 24 hr tablet Take 1 tablet (25 mg total) by mouth daily. 90 tablet 1  . oxyCODONE (OXYCONTIN) 20 mg 12 hr tablet Take 1 tablet (20 mg total) by mouth every 12 (twelve) hours. 60 tablet 0  . oxyCODONE-acetaminophen (PERCOCET) 10-325 MG tablet Take 1 tablet by mouth every 6 (six) hours as needed for pain. 140 tablet 0  . pantoprazole (PROTONIX) 40 MG tablet Take 1 tablet (40 mg total) by mouth daily. 90 tablet 3  . pioglitazone (ACTOS) 15 MG tablet Take 1 tablet (15 mg total) by mouth daily. 90 tablet 1  . pravastatin (PRAVACHOL) 40 MG tablet Take 40 mg by mouth daily.    . sitaGLIPtin-metformin (JANUMET) 50-1000 MG tablet Take 1 tablet by mouth daily with breakfast. 90 tablet 3   No current facility-administered medications for this visit.    OBJECTIVE: Middle-aged white woman who appears stated age 64:   10/28/19 1428  BP: 109/70  Pulse: 84  Resp: 16  Temp: (!) 97.4 F (36.3 C)  SpO2: 96%   Wt Readings from Last 3 Encounters:  10/28/19 220 lb 11.2 oz (100.1 kg)  09/29/19 220 lb 12.8 oz (100.2 kg)  07/14/19 224 lb 14.4 oz (102 kg)   Body mass index is 37.88 kg/m.    ECOG FS:1 - Symptomatic but completely ambulatory   Sclerae unicteric, EOMs intact Wearing a mask No cervical or supraclavicular adenopathy Lungs no rales or rhonchi Heart regular rate and rhythm Abd soft, nontender, positive bowel sounds MSK no focal spinal tenderness Neuro: nonfocal, well oriented, appropriate affect Breasts: Status post right mastectomy.  LAB RESULTS:  CMP     Component Value  Date/Time   NA 138 09/29/2019 1002   NA 140 01/31/2018 0000   K 4.2 09/29/2019 1002   CL 102 09/29/2019 1002   CO2 24 09/29/2019 1002   GLUCOSE 151 (H) 09/29/2019 1002   BUN 18 09/29/2019 1002  BUN 22 (A) 01/31/2018 0000   CREATININE 1.02 (H) 09/29/2019 1002   CREATININE 1.05 (H) 03/24/2019 0833   CALCIUM 9.4 09/29/2019 1002   PROT 7.3 09/29/2019 1002   ALBUMIN 3.7 09/29/2019 1002   AST 14 (L) 09/29/2019 1002   AST 15 03/24/2019 0833   ALT 13 09/29/2019 1002   ALT 16 03/24/2019 0833   ALKPHOS 65 09/29/2019 1002   BILITOT 0.4 09/29/2019 1002   BILITOT 0.3 03/24/2019 0833   GFRNONAA 58 (L) 09/29/2019 1002   GFRNONAA 57 (L) 03/24/2019 0833   GFRAA >60 09/29/2019 1002   GFRAA >60 03/24/2019 0833    No results found for: TOTALPROTELP, ALBUMINELP, A1GS, A2GS, BETS, BETA2SER, GAMS, MSPIKE, SPEI  No results found for: KPAFRELGTCHN, LAMBDASER, KAPLAMBRATIO  Lab Results  Component Value Date   WBC 2.7 (L) 10/28/2019   NEUTROABS PENDING 10/28/2019   HGB 10.4 (L) 10/28/2019   HCT 32.6 (L) 10/28/2019   MCV 106.2 (H) 10/28/2019   PLT 401 (H) 10/28/2019    No results found for: LABCA2  No components found for: [id number]  No results for input(s): INR in the last 168 hours.  No results found for: LABCA2  No results found for: ZRA076  No results found for: AUQ333  No results found for: LKT625  Lab Results  Component Value Date   CA2729 124.4 (H) 09/29/2019    No components found for: HGQUANT  No results found for: CEA1 / No results found for: CEA1   No results found for: AFPTUMOR  No results found for: CHROMOGRNA  No results found for: HGBA, HGBA2QUANT, HGBFQUANT, HGBSQUAN (Hemoglobinopathy evaluation)   No results found for: LDH  No results found for: IRON, TIBC, IRONPCTSAT (Iron and TIBC)  No results found for: FERRITIN  Urinalysis No results found for: COLORURINE, APPEARANCEUR, LABSPEC, PHURINE, GLUCOSEU, HGBUR, BILIRUBINUR, KETONESUR, PROTEINUR,  UROBILINOGEN, NITRITE, LEUKOCYTESUR   STUDIES: No results found.   ELIGIBLE FOR AVAILABLE RESEARCH PROTOCOL: no   ASSESSMENT: 64 y.o. [place], [place] woman with stage IV breast cancer, as follows:  (1) status post right mastectomy in 2000, for a pT2 pN2, stage IIIA invasive ductal carcinoma, estrogen receptor positive, progesterone receptor not tested, HER-2 negative (0) by immunohistochemistry  (a) adjuvant chemotherapy with doxorubicin and cyclophosphamide in dose dense fashion x4 followed by paclitaxel in dose dense fashion x4  (b) adjuvant radiation: 30 doses  (c) antiestrogens: Tamoxifen for 5 years, completed 2005  (2) METASTASTIC DISEASE: 2008, involving bones, lungs, and lymph nodes  (a) CA 27-29 is informative  (b) CT of the chest abdomen and pelvis in [facility] finds stable sclerotic metastases (T8, T10, T11, sternum, L4, L5); 0.6 cm left upper lobe lung nodule stable  (3)  prior anti-estrogen treatments:  (a) fulvestrant--progression  (b) exemestane/everolimus--hyperlipidemia, hyperglycemia  (4) prior chemotherapy treatments:  (a) capecitabine: progression  (b) Abraxane, August 2014 through 11/18/2015: good response but stopped due to neuropathy  (c) gemcitabine 01/20/2016--?; multiple interruptions secondary to infections  (5) radiation therapy:  (a) T spine and Right femur, completed 01/10/2016  (6) letrozole/ palbociclib started 07/13/2016  (7) bone treatment:  (a) denosumab/Xgeva--discontinued January 2016 due to osteonecrosis of the jaw  (8) cancer associated pain:  (a) oxycodone/APAP 10/325 QID as needed  (b) PMP Aware reviewed 09/28/2019  (c) OxyContin 20 mg p.o. twice daily added 09/29/2019  (9) restaging studies:  (a) mostly stable bone lesions with evidence of progression at L3-L5; no epidural tumor by total spinal MRI in November 2019  (b) no evidence  of progressive lung, lymph node or liver lesions by CT scans of the chest abdomen and  pelvis and bone scan December 2019  (c) CT scans abd/pelvis 03/31/2019 shows stable bone metastases, no extraosseous disease  (d) MRI pelvis 04/09/2019 shows bilateral ilium, left acetabulum and lumbar mets, stable  (e) ct abd/pelvis 09/01/2019 shows stable bone mets, slight increase left effusion  (10) palliative radiation 11/18/2018 through 12/02/2018 Site/dose:   1. Lumbar spine; 35 Gy in 14 fractions of 2.5 Gy                      2. Pelvis; 35 Gy in 14 fractions of 2.5 Gy   PLAN: Mia [name] is now a little more than 12 years out from definitive diagnosis of metastatic breast cancer.  Her disease is generally well controlled.  She is tolerating the letrozole and palbociclib well and we are not having to make any changes in her treatment, now a little over 3 years into this treatment modality.  Her pain is better controlled on the OxyContin.  I have refilled that for her.  She is developing some sinus issues which are not improved with her current treatment.  I suggest that she take the Allegra twice a day and I wrote for a Z-Pak for her.  I suggested after a week she continue the Allegra once daily until Easter.  I commended her a walking program.  Her tumor marker has been slowly and very erratically drifting up.  We discussed obtaining a CT of the chest before her March visit but both she and I think it is a little too soon.  Unless there has been a marked increase we will probably wait until 6 months.  She will see me in 2 months.  She knows to call for any other issue that may develop before then.  Total encounter time 35 minutes.*  Shyheem Whitham, Virgie Dad, MD  10/28/19 2:37 PM Medical Oncology and Hematology [facility] [place], [place] [address] Tel. [phone]    Fax. [phone]   I, Wilburn Mylar, am acting as scribe for Dr. Virgie Dad. Mable Dara.  I, Lurline Del MD, have reviewed the above documentation for accuracy and completeness, and  I agree with the above.  *Total Encounter Time as defined by the Centers for Medicare and Medicaid Services includes, in addition to the face-to-face time of a patient visit (documented in the note above) non-face-to-face time: obtaining and reviewing outside history, ordering and reviewing medications, tests or procedures, care coordination (communications with other health care professionals or caregivers) and documentation in the medical record.

## 2019-10-28 ENCOUNTER — Inpatient Hospital Stay: Payer: MEDICARE | Attending: Oncology

## 2019-10-28 ENCOUNTER — Other Ambulatory Visit: Payer: Self-pay

## 2019-10-28 ENCOUNTER — Inpatient Hospital Stay (HOSPITAL_BASED_OUTPATIENT_CLINIC_OR_DEPARTMENT_OTHER): Payer: MEDICARE | Admitting: Oncology

## 2019-10-28 VITALS — BP 109/70 | HR 84 | Temp 97.4°F | Resp 16 | Ht 64.0 in | Wt 220.7 lb

## 2019-10-28 DIAGNOSIS — M879 Osteonecrosis, unspecified: Secondary | ICD-10-CM

## 2019-10-28 DIAGNOSIS — K5900 Constipation, unspecified: Secondary | ICD-10-CM | POA: Diagnosis not present

## 2019-10-28 DIAGNOSIS — G620 Drug-induced polyneuropathy: Secondary | ICD-10-CM

## 2019-10-28 DIAGNOSIS — K760 Fatty (change of) liver, not elsewhere classified: Secondary | ICD-10-CM

## 2019-10-28 DIAGNOSIS — C7800 Secondary malignant neoplasm of unspecified lung: Secondary | ICD-10-CM

## 2019-10-28 DIAGNOSIS — E669 Obesity, unspecified: Secondary | ICD-10-CM

## 2019-10-28 DIAGNOSIS — I10 Essential (primary) hypertension: Secondary | ICD-10-CM | POA: Diagnosis not present

## 2019-10-28 DIAGNOSIS — Z90722 Acquired absence of ovaries, bilateral: Secondary | ICD-10-CM | POA: Insufficient documentation

## 2019-10-28 DIAGNOSIS — C50919 Malignant neoplasm of unspecified site of unspecified female breast: Secondary | ICD-10-CM | POA: Diagnosis not present

## 2019-10-28 DIAGNOSIS — Z170 Estrogen receptor positive status [ER+]: Secondary | ICD-10-CM | POA: Insufficient documentation

## 2019-10-28 DIAGNOSIS — C50911 Malignant neoplasm of unspecified site of right female breast: Secondary | ICD-10-CM | POA: Diagnosis not present

## 2019-10-28 DIAGNOSIS — Z79811 Long term (current) use of aromatase inhibitors: Secondary | ICD-10-CM | POA: Insufficient documentation

## 2019-10-28 DIAGNOSIS — Z9011 Acquired absence of right breast and nipple: Secondary | ICD-10-CM | POA: Insufficient documentation

## 2019-10-28 DIAGNOSIS — G893 Neoplasm related pain (acute) (chronic): Secondary | ICD-10-CM | POA: Insufficient documentation

## 2019-10-28 DIAGNOSIS — Z79899 Other long term (current) drug therapy: Secondary | ICD-10-CM | POA: Insufficient documentation

## 2019-10-28 DIAGNOSIS — Z7982 Long term (current) use of aspirin: Secondary | ICD-10-CM | POA: Insufficient documentation

## 2019-10-28 DIAGNOSIS — R232 Flushing: Secondary | ICD-10-CM | POA: Diagnosis not present

## 2019-10-28 DIAGNOSIS — R911 Solitary pulmonary nodule: Secondary | ICD-10-CM | POA: Insufficient documentation

## 2019-10-28 DIAGNOSIS — Z803 Family history of malignant neoplasm of breast: Secondary | ICD-10-CM | POA: Diagnosis not present

## 2019-10-28 DIAGNOSIS — C7951 Secondary malignant neoplasm of bone: Secondary | ICD-10-CM | POA: Diagnosis not present

## 2019-10-28 DIAGNOSIS — I700 Atherosclerosis of aorta: Secondary | ICD-10-CM | POA: Diagnosis not present

## 2019-10-28 DIAGNOSIS — Z794 Long term (current) use of insulin: Secondary | ICD-10-CM | POA: Diagnosis not present

## 2019-10-28 DIAGNOSIS — T451X5A Adverse effect of antineoplastic and immunosuppressive drugs, initial encounter: Secondary | ICD-10-CM

## 2019-10-28 DIAGNOSIS — E785 Hyperlipidemia, unspecified: Secondary | ICD-10-CM | POA: Insufficient documentation

## 2019-10-28 DIAGNOSIS — Z90710 Acquired absence of both cervix and uterus: Secondary | ICD-10-CM | POA: Diagnosis not present

## 2019-10-28 DIAGNOSIS — I158 Other secondary hypertension: Secondary | ICD-10-CM

## 2019-10-28 DIAGNOSIS — Z7189 Other specified counseling: Secondary | ICD-10-CM

## 2019-10-28 DIAGNOSIS — E1169 Type 2 diabetes mellitus with other specified complication: Secondary | ICD-10-CM

## 2019-10-28 LAB — CBC WITH DIFFERENTIAL/PLATELET
Abs Immature Granulocytes: 0.01 10*3/uL (ref 0.00–0.07)
Basophils Absolute: 0.1 10*3/uL (ref 0.0–0.1)
Basophils Relative: 3 %
Eosinophils Absolute: 0 10*3/uL (ref 0.0–0.5)
Eosinophils Relative: 2 %
HCT: 32.6 % — ABNORMAL LOW (ref 36.0–46.0)
Hemoglobin: 10.4 g/dL — ABNORMAL LOW (ref 12.0–15.0)
Immature Granulocytes: 0 %
Lymphocytes Relative: 19 %
Lymphs Abs: 0.5 10*3/uL — ABNORMAL LOW (ref 0.7–4.0)
MCH: 33.9 pg (ref 26.0–34.0)
MCHC: 31.9 g/dL (ref 30.0–36.0)
MCV: 106.2 fL — ABNORMAL HIGH (ref 80.0–100.0)
Monocytes Absolute: 0.2 10*3/uL (ref 0.1–1.0)
Monocytes Relative: 7 %
Neutro Abs: 1.9 10*3/uL (ref 1.7–7.7)
Neutrophils Relative %: 69 %
Platelets: 401 10*3/uL — ABNORMAL HIGH (ref 150–400)
RBC: 3.07 MIL/uL — ABNORMAL LOW (ref 3.87–5.11)
RDW: 14.6 % (ref 11.5–15.5)
WBC: 2.7 10*3/uL — ABNORMAL LOW (ref 4.0–10.5)
nRBC: 0 % (ref 0.0–0.2)

## 2019-10-28 LAB — COMPREHENSIVE METABOLIC PANEL
ALT: 8 U/L (ref 0–44)
AST: 14 U/L — ABNORMAL LOW (ref 15–41)
Albumin: 3.5 g/dL (ref 3.5–5.0)
Alkaline Phosphatase: 64 U/L (ref 38–126)
Anion gap: 10 (ref 5–15)
BUN: 17 mg/dL (ref 8–23)
CO2: 24 mmol/L (ref 22–32)
Calcium: 9.1 mg/dL (ref 8.9–10.3)
Chloride: 106 mmol/L (ref 98–111)
Creatinine, Ser: 1.21 mg/dL — ABNORMAL HIGH (ref 0.44–1.00)
GFR calc Af Amer: 55 mL/min — ABNORMAL LOW (ref >60)
GFR calc non Af Amer: 48 mL/min — ABNORMAL LOW (ref >60)
Glucose, Bld: 131 mg/dL — ABNORMAL HIGH (ref 70–99)
Potassium: 4.7 mmol/L (ref 3.5–5.1)
Sodium: 140 mmol/L (ref 135–145)
Total Bilirubin: 0.4 mg/dL (ref 0.3–1.2)
Total Protein: 6.8 g/dL (ref 6.5–8.1)

## 2019-10-28 MED ORDER — AZITHROMYCIN 250 MG PO TABS: ORAL_TABLET | ORAL | 0 refills | Status: DC

## 2019-10-28 MED ORDER — IBRANCE 125 MG PO TABS: ORAL_TABLET | ORAL | 3 refills | Status: DC

## 2019-10-28 MED ORDER — OXYCODONE HCL ER 20 MG PO T12A: 20.0000 mg | EXTENDED_RELEASE_TABLET | Freq: Two times a day (BID) | ORAL | 0 refills | Status: DC

## 2019-10-29 ENCOUNTER — Other Ambulatory Visit: Payer: Self-pay | Admitting: Oncology

## 2019-10-29 ENCOUNTER — Encounter: Payer: Self-pay | Admitting: *Deleted

## 2019-10-29 ENCOUNTER — Telehealth: Payer: Self-pay | Admitting: Oncology

## 2019-10-29 LAB — CANCER ANTIGEN 27.29: CA 27.29: 141.9 U/mL — ABNORMAL HIGH (ref 0.0–38.6)

## 2019-10-29 NOTE — Telephone Encounter (Signed)
I talk with patient regarding schedule  

## 2019-10-30 ENCOUNTER — Other Ambulatory Visit: Payer: Self-pay

## 2019-10-30 MED ORDER — METOPROLOL SUCCINATE ER 25 MG PO TB24: 25.0000 mg | ORAL_TABLET | Freq: Every day | ORAL | 1 refills | Status: DC

## 2019-11-20 ENCOUNTER — Telehealth: Payer: Self-pay | Admitting: General Practice

## 2019-11-20 NOTE — Telephone Encounter (Signed)
Patient is calling requesting a TOC to Dr. Bryan Lemma. Pt was a former patient of Dr. Deborra Medina. Pls advise. CB is [phone]

## 2019-11-24 NOTE — Telephone Encounter (Signed)
MC-Pt is requesting to schedule a TOC visit with you/plz advise/thx dmf

## 2019-11-25 ENCOUNTER — Encounter: Payer: Self-pay | Admitting: Oncology

## 2019-11-25 NOTE — Telephone Encounter (Signed)
Ok with me 

## 2019-11-25 NOTE — Telephone Encounter (Signed)
Plz call pt to sched TOC visit with Dr. C/thx dmf 

## 2019-11-26 ENCOUNTER — Other Ambulatory Visit: Payer: Self-pay | Admitting: Oncology

## 2019-11-26 MED ORDER — OXYCODONE HCL ER 20 MG PO T12A: 20.0000 mg | EXTENDED_RELEASE_TABLET | Freq: Two times a day (BID) | ORAL | 0 refills | Status: DC

## 2019-11-27 NOTE — Telephone Encounter (Signed)
Scheduled

## 2019-12-01 DIAGNOSIS — H5211 Myopia, right eye: Secondary | ICD-10-CM | POA: Diagnosis not present

## 2019-12-01 DIAGNOSIS — E119 Type 2 diabetes mellitus without complications: Secondary | ICD-10-CM | POA: Diagnosis not present

## 2019-12-03 LAB — HM DIABETES EYE EXAM

## 2019-12-22 ENCOUNTER — Encounter: Payer: Self-pay | Admitting: Oncology

## 2019-12-22 NOTE — Progress Notes (Signed)
[place]  [phone] [phone]    ID: Mia [name] DOB: 1956-09-02  MR#: [id number]  FSF#:[id number]  Patient Care Team: Mia Passy, MD as PCP - General (Family Medicine) Mia [name], Mia Dad, MD as Consulting Physician (Oncology) OTHER MD: Mia Austria MD, Mia Sabal PA   CHIEF COMPLAINT: Estrogen receptor positive stage IV breast cancer (s/p right mastectomy)  CURRENT TREATMENT: Letrozole, palbociclib   INTERVAL HISTORY: Mia [name] returns today for follow-up and treatment of her etrogen receptor positive stage IV breast cancer.    At the last visit we started her on OxyContin 20 mg twice daily with Percocet as needed.  She is doing much better on this regimen than the prior.  She is walking more, goes outside to walk, has a lot less pain than before and in general is slowly improving as far as her exercise capacity is concerned.  She only takes Percocet once a day and not every day.  She takes the OxyContin usually around 9:30 in the morning and 9:30 in the evening.  She continues on letrozole.  She has occasional mild hot flashes at night.    She also continues on palbociclib.  She is tolerating this with no unusual side effects that she is aware of. Nausea is not an issue.  It probably does contribute some to her fatigue.  There is no bone density screening on file.  She is not receiving denosumab/Xgeva or zoledronate because of her history of osteonecrosis of the jaw.    We are following her CA 27-29. Lab Results  Component Value Date   CA2729 141.9 (H) 10/28/2019   CA2729 124.4 (H) 09/29/2019   CA2729 108.8 (H) 08/11/2019   CA2729 108.3 (H) 07/14/2019   CA2729 84.9 (H) 06/16/2019     REVIEW OF SYSTEMS: Mia [name] tells me she walks outside the house a few houses up and up and back this is much better than before.  She understands that she can take more Percocet if needed but she just has not been needing it.  She usually waits  until she is slightly behind in bowel movements to use MiraLAX.  She has not had problems with that and does not need routine bowel softeners she tells me.  She does have some insomnia problem.  She is using an air purifier and that is helping her breathe better at night.  She had her first [place] vaccine without complications and she is scheduled for the next 1 on 01/08/2020.  A detailed review of systems today was otherwise stable.   HISTORY OF CURRENT ILLNESS: From the original intake note:  We have reviewed the medical records from [place], which is the source of the information below:  Mia [name] was initially diagnosed with Stage IIIA (T2N2M0) invasive ductal carcinoma, estrogen receptor positive and HER2 negative right breast cancer in 2000. She underwent right mastectomy, with 4 positive metastatic lymph nodes. She had chemotherapy with taxol and cytoxan and fulvestrant in the past. She had radiation and completed 5 years of tamoxifen.   She was diagnosed with Stage IV disease 04/18/2005 with metastases to the bone, lung, and additional nodes. She was on capecitabine but was discontinued after rising CA 27-29 and scans.   She started anastrozole and everolimus with Delton See in April 2014. She tolerated this well, but this was discontinued in July 2014 due to abnormal blood tests, hyperlipidemia and hyperglycemia.  She began Abraxane 159m /m2 and Xgeva in August 2014 weekly x3 with 1 week  off. She tolerated this treatment well. She discontinued Xgeva January 2016 due to osteonecrosis of the jaw and mouth issues. She continued abraxane alone until her last dose on 11/18/2015 due to neuropathy and disease progression with restaging studies on 11/23/2015 with a CT chest abdomen and pelvis and one scan showing skull, sternal and femoral lesions.   She was switched to gemcitabine starting 01/20/2016 weekly x3 and 1 week off. This was discontinued on 06/15/2016 due to a port related  jugular clot on the right side. She was given Lovenox for 3 months.  She was switched to Letrozole 2.5 mg and palbociclib 125 mg starting 07/13/2016. She tolerated this well. Restaging studies with CT chest abdomen and pelvis and bone scan on 07/01/2017 showed stable/ improving lesions. CA 27-29 was stable.  During the last few months of follow up in [place], she completed restaging studies with a CT chest abdomen and pelvis at [facility] on 12/17/2017 showing: A 6 mm pulmonary nodule in the left upper lobe laterally. Progression of metastatic disease to the bones at the L4 and L5 vertebral bodies. Sclerotic metastases at T8, T10, an T11, are similar to previous exams. Sclerotic metastases in the sternum stable. Hepatic steatosis. Aortic atherosclerosis.  Most recent CA-44-29 (January 2019) was 58.  Most recent hemoglobin A1c was 7.1 according to the patient  The patient's subsequent history is as detailed below.   PAST MEDICAL HISTORY: Past Medical History:  Diagnosis Date  . Breast cancer metastasized to bone ([place])   . Hyperlipidemia   . Hypertension   . Lymphedema of right arm   . Neuropathy associated with cancer ([place])   . Obesity (BMI 35.0-39.9 without comorbidity)      PAST SURGICAL HISTORY: Past Surgical History:  Procedure Laterality Date  . CATARACT EXTRACTION Left   . HYSTERECTOMY ABDOMINAL WITH SALPINGO-OOPHORECTOMY    . MODIFIED RADICAL MASTECTOMY Right      FAMILY HISTORY: Family History  Problem Relation Age of Onset  . Diabetes Mother   . Breast cancer Mother 36  . Cerebral aneurysm Mother   . Pulmonary embolism Father   . Colon cancer Father 29  . Breast cancer Sister 8       noninvasive  . Breast cancer Paternal Grandmother 78    The patient reports she had negative genetic testing at [facility] 2014, report not available. --The patient's father died at age 3 due to a PE, and he also had a history of colon cancer diagnosed at age 84. The  patient's mother died at age 51 due to diabetes and survived a cerebral aneurysm. The patient's mother also had a history of breast cancer diagnosed at age 85.  The patient has no brothers and 1 sister. The patient's sister was diagnosed with non invasive breast cancer at age 70. There was a paternal grandmother diagnosed with breast cancer at age 4. The patient denies a family history of ovarian cancer.    GYNECOLOGIC HISTORY:  Patient's last menstrual period was 10/01/2006. Menarche: 64 years old Age at first live birth: 64 years old She is [place] P2.  She is status post hysterectomy with bilateral salpingo- oophorectomy 12/23/2006 with benign pathology ([id number]) She never used HRT.    SOCIAL HISTORY:  Mia [name] is disabled due to her breast cancer. She used to be Surveyor, quantity for a cardiology office. Her husband, [name] is in the process of retiring. He is a Electrical engineer for a power company. The patient's daughter, Mia [name], lives in [place]  and works as a Teaching laboratory technician. The patient's second daughter, Mia [name] is a stay at home mom and is a special needs teacher, who will soon move to [place] [place] The patient plans on attending [employer].     ADVANCED DIRECTIVES: In the absence of any documentation to the contrary, the patient's spouse is their HCPOA.    HEALTH MAINTENANCE: Social History   Tobacco Use  . Smoking status: Never Smoker  . Smokeless tobacco: Never Used  Substance Use Topics  . Alcohol use: Not Currently  . Drug use: Never    Colonoscopy: 2017   PAP:  Bone density: never  Mammogram: 2017   Allergies  Allergen Reactions  . Morphine And Related Anaphylaxis    Current Outpatient Medications  Medication Sig Dispense Refill  . amlodipine-atorvastatin (CADUET) 10-10 MG tablet Take 1 tablet by mouth daily.    Marland Kitchen aspirin 81 MG chewable tablet Chew by mouth daily.    . fenofibrate (TRICOR) 145 MG tablet Take 145 mg by  mouth daily.    . fexofenadine (ALLEGRA ALLERGY) 60 MG tablet Take 1 tablet (60 mg total) by mouth 2 (two) times daily.    . fluticasone (FLONASE) 50 MCG/ACT nasal spray SPRAY 2 SPRAYS INTO EACH NOSTRIL EVERY DAY 48 mL 1  . gabapentin (NEURONTIN) 300 MG capsule Take 1 capsule (300 mg total) by mouth 2 (two) times daily. Pt is only taking 2 capsules a day 180 capsule 3  . IBRANCE 125 MG tablet TAKE 1 TABLET DAILY WITH BREAKFAST. TAKE WHOLE WITH FOOD FOR 21 DAYS ON AND 7 DAYS OFF EVERY 28 DAYS 63 tablet 3  . Insulin Admin Supplies MISC Inject 24 Units into the skin daily.    . Insulin Pen Needle 31G X 5 MM MISC UAD for Sq inj for DM qd 100 each PRN  . LANTUS SOLOSTAR 100 UNIT/ML Solostar Pen NJECT 24 UNITS INTO THE SKIN DAILY 15 mL 1  . letrozole (FEMARA) 2.5 MG tablet Take 1 tablet (2.5 mg total) by mouth daily. 90 tablet 4  . loratadine (CLARITIN) 10 MG tablet Take 10 mg by mouth daily.    Marland Kitchen losartan (COZAAR) 50 MG tablet Take 1 tablet (50 mg total) by mouth 2 (two) times daily. 180 tablet 1  . metoprolol succinate (TOPROL-XL) 25 MG 24 hr tablet Take 1 tablet (25 mg total) by mouth daily. 90 tablet 1  . oxyCODONE (OXYCONTIN) 20 mg 12 hr tablet Take 1 tablet (20 mg total) by mouth every 12 (twelve) hours. 60 tablet 0  . oxyCODONE-acetaminophen (PERCOCET) 10-325 MG tablet Take 1 tablet by mouth every 6 (six) hours as needed for pain. 140 tablet 0  . pantoprazole (PROTONIX) 40 MG tablet Take 1 tablet (40 mg total) by mouth daily. 90 tablet 3  . pioglitazone (ACTOS) 15 MG tablet Take 1 tablet (15 mg total) by mouth daily. 90 tablet 1  . pravastatin (PRAVACHOL) 40 MG tablet Take 40 mg by mouth daily.    . sitaGLIPtin-metformin (JANUMET) 50-1000 MG tablet Take 1 tablet by mouth daily with breakfast. 90 tablet 3   No current facility-administered medications for this visit.    OBJECTIVE: Middle-aged white woman who appears stated age 38:   12/23/19 1425  BP: 126/64  Pulse: 76  Resp: 18    Temp: 98.2 F (36.8 C)  SpO2: 100%   Wt Readings from Last 3 Encounters:  12/23/19 224 lb 11.2 oz (101.9 kg)  10/28/19 220 lb 11.2 oz (100.1 kg)  09/29/19 220 lb 12.8 oz (100.2 kg)   Body mass index is 38.57 kg/m.    ECOG FS:2 - Symptomatic, <50% confined to bed  Sclerae unicteric, EOMs intact Wearing a mask No cervical or supraclavicular adenopathy Lungs no rales or rhonchi Heart regular rate and rhythm Abd soft, nontender, positive bowel sounds MSK no focal spinal tenderness, no upper extremity lymphedema Neuro: nonfocal, well oriented, appropriate affect Breasts: Status post right mastectomy, with no evidence of chest wall recurrence.  Left breast is benign.  Both axillae are benign.   LAB RESULTS:  CMP     Component Value Date/Time   NA 140 12/23/2019 1350   NA 140 01/31/2018 0000   K 4.5 12/23/2019 1350   CL 106 12/23/2019 1350   CO2 24 12/23/2019 1350   GLUCOSE 126 (H) 12/23/2019 1350   BUN 18 12/23/2019 1350   BUN 22 (A) 01/31/2018 0000   CREATININE 1.12 (H) 12/23/2019 1350   CREATININE 1.05 (H) 03/24/2019 0833   CALCIUM 9.3 12/23/2019 1350   PROT 6.7 12/23/2019 1350   ALBUMIN 3.4 (L) 12/23/2019 1350   AST 18 12/23/2019 1350   AST 15 03/24/2019 0833   ALT 15 12/23/2019 1350   ALT 16 03/24/2019 0833   ALKPHOS 65 12/23/2019 1350   BILITOT 0.4 12/23/2019 1350   BILITOT 0.3 03/24/2019 0833   GFRNONAA 52 (L) 12/23/2019 1350   GFRNONAA 57 (L) 03/24/2019 0833   GFRAA >60 12/23/2019 1350   GFRAA >60 03/24/2019 0833    No results found for: TOTALPROTELP, ALBUMINELP, A1GS, A2GS, BETS, BETA2SER, GAMS, MSPIKE, SPEI  No results found for: KPAFRELGTCHN, LAMBDASER, KAPLAMBRATIO  Lab Results  Component Value Date   WBC 3.1 (L) 12/23/2019   NEUTROABS 2.4 12/23/2019   HGB 10.6 (L) 12/23/2019   HCT 32.8 (L) 12/23/2019   MCV 107.2 (H) 12/23/2019   PLT 443 (H) 12/23/2019    No results found for: LABCA2  No components found for: OIBBCW888  No results for  input(s): INR in the last 168 hours.  No results found for: LABCA2  No results found for: BVQ945  No results found for: WTU882  No results found for: CMK349  Lab Results  Component Value Date   CA2729 141.9 (H) 10/28/2019    No components found for: HGQUANT  No results found for: CEA1 / No results found for: CEA1   No results found for: AFPTUMOR  No results found for: CHROMOGRNA  No results found for: HGBA, HGBA2QUANT, HGBFQUANT, HGBSQUAN (Hemoglobinopathy evaluation)   No results found for: LDH  No results found for: IRON, TIBC, IRONPCTSAT (Iron and TIBC)  No results found for: FERRITIN  Urinalysis No results found for: COLORURINE, APPEARANCEUR, LABSPEC, PHURINE, GLUCOSEU, HGBUR, BILIRUBINUR, KETONESUR, PROTEINUR, UROBILINOGEN, NITRITE, LEUKOCYTESUR   STUDIES: Refuses mammography at this point (12/23/2019)  ELIGIBLE FOR AVAILABLE RESEARCH PROTOCOL: no   ASSESSMENT: 64 y.o. [place], [place] woman with stage IV breast cancer, as follows:  (1) status post right mastectomy in 2000, for a pT2 pN2, stage IIIA invasive ductal carcinoma, estrogen receptor positive, progesterone receptor not tested, HER-2 negative (0) by immunohistochemistry  (a) adjuvant chemotherapy with doxorubicin and cyclophosphamide in dose dense fashion x4 followed by paclitaxel in dose dense fashion x4  (b) adjuvant radiation: 30 doses  (c) antiestrogens: Tamoxifen for 5 years, completed 2005  (2) METASTASTIC DISEASE: 2008, involving bones, lungs, and lymph nodes  (a) CA 27-29 is informative  (b) CT of the chest abdomen and pelvis in [facility] finds stable sclerotic  metastases (T8, T10, T11, sternum, L4, L5); 0.6 cm left upper lobe lung nodule stable  (3)  prior anti-estrogen treatments:  (a) fulvestrant--progression  (b) exemestane/everolimus--hyperlipidemia, hyperglycemia  (4) prior chemotherapy treatments:  (a) capecitabine: progression  (b) Abraxane, August 2014 through  11/18/2015: good response but stopped due to neuropathy  (c) gemcitabine 01/20/2016--?; multiple interruptions secondary to infections  (5) radiation therapy:  (a) T spine and Right femur, completed 01/10/2016  (6) letrozole/ palbociclib started 07/13/2016  (7) bone treatment:  (a) denosumab/Xgeva--discontinued January 2016 due to osteonecrosis of the jaw  (8) cancer associated pain:  (a) oxycodone/APAP 10/325 QID as needed  (b) PMP Aware reviewed 12/23/2019  (c) OxyContin 20 mg p.o. twice daily added 09/29/2019  (d) bowel prophylaxis: MiraLAX as needed  (9) restaging studies:  (a) mostly stable bone lesions with evidence of progression at L3-L5; no epidural tumor by total spinal MRI in November 2019  (b) no evidence of progressive lung, lymph node or liver lesions by CT scans of the chest abdomen and pelvis and bone scan December 2019  (c) CT scans abd/pelvis 03/31/2019 shows stable bone metastases, no extraosseous disease  (d) MRI pelvis 04/09/2019 shows bilateral ilium, left acetabulum and lumbar mets, stable  (e) ct abd/pelvis 09/01/2019 shows stable bone mets, slight increase left effusion  (10) palliative radiation 11/18/2018 through 12/02/2018 Site/dose:   1. Lumbar spine; 35 Gy in 14 fractions of 2.5 Gy                      2. Pelvis; 35 Gy in 14 fractions of 2.5 Gy   PLAN: Joanette is now in her 13th year from initial diagnosis of metastatic breast cancer.  Her disease is moderately well controlled although she does have chronic pain secondary to it.  The pain is much better since she started the OxyContin.  She is using it and the occasional Percocet very appropriately to increase her functional status.  She is not having side effects other than mild to moderate constipation from this.  Her CA 27-29 tumor marker has been creeping up.  If it is up again with 2days report I will set her up for CT scans before her next lab visit here.  Otherwise it will be 2 months from  now.  We discussed mammography and right now she just wants to continue to follow scans.  Am delighted that she is doing so well with her vaccine and hopefully that will help normalize things even more.  The improved weather also should make a difference to her exercise plans  She knows to call for any other issue that may develop before the next visit  Total encounter time 30 minutes.*  Era Parr, Mia Dad, MD  12/23/19 3:23 PM Medical Oncology and Hematology [facility] [place], [place] [address] Tel. [phone]    Fax. [phone]   I, Wilburn Mylar, am acting as scribe for Dr. Virgie [name]. Tobe Kervin.  I, Lurline Del MD, have reviewed the above documentation for accuracy and completeness, and I agree with the above.   *Total Encounter Time as defined by the Centers for Medicare and Medicaid Services includes, in addition to the face-to-face time of a patient visit (documented in the note above) non-face-to-face time: obtaining and reviewing outside history, ordering and reviewing medications, tests or procedures, care coordination (communications with other health care professionals or caregivers) and documentation in the medical record.

## 2019-12-23 ENCOUNTER — Other Ambulatory Visit: Payer: Self-pay

## 2019-12-23 ENCOUNTER — Inpatient Hospital Stay: Payer: MEDICARE | Attending: Oncology | Admitting: Oncology

## 2019-12-23 ENCOUNTER — Inpatient Hospital Stay: Payer: MEDICARE

## 2019-12-23 VITALS — BP 126/64 | HR 76 | Temp 98.2°F | Resp 18 | Ht 64.0 in | Wt 224.7 lb

## 2019-12-23 DIAGNOSIS — G893 Neoplasm related pain (acute) (chronic): Secondary | ICD-10-CM | POA: Insufficient documentation

## 2019-12-23 DIAGNOSIS — K5900 Constipation, unspecified: Secondary | ICD-10-CM | POA: Insufficient documentation

## 2019-12-23 DIAGNOSIS — E785 Hyperlipidemia, unspecified: Secondary | ICD-10-CM | POA: Insufficient documentation

## 2019-12-23 DIAGNOSIS — C7800 Secondary malignant neoplasm of unspecified lung: Secondary | ICD-10-CM

## 2019-12-23 DIAGNOSIS — J984 Other disorders of lung: Secondary | ICD-10-CM | POA: Insufficient documentation

## 2019-12-23 DIAGNOSIS — Z800 Family history of malignant neoplasm of digestive organs: Secondary | ICD-10-CM | POA: Diagnosis not present

## 2019-12-23 DIAGNOSIS — Z170 Estrogen receptor positive status [ER+]: Secondary | ICD-10-CM | POA: Insufficient documentation

## 2019-12-23 DIAGNOSIS — E669 Obesity, unspecified: Secondary | ICD-10-CM | POA: Insufficient documentation

## 2019-12-23 DIAGNOSIS — Z803 Family history of malignant neoplasm of breast: Secondary | ICD-10-CM | POA: Diagnosis not present

## 2019-12-23 DIAGNOSIS — C50919 Malignant neoplasm of unspecified site of unspecified female breast: Secondary | ICD-10-CM | POA: Diagnosis not present

## 2019-12-23 DIAGNOSIS — K760 Fatty (change of) liver, not elsewhere classified: Secondary | ICD-10-CM | POA: Insufficient documentation

## 2019-12-23 DIAGNOSIS — I10 Essential (primary) hypertension: Secondary | ICD-10-CM | POA: Diagnosis not present

## 2019-12-23 DIAGNOSIS — Z7189 Other specified counseling: Secondary | ICD-10-CM

## 2019-12-23 DIAGNOSIS — G620 Drug-induced polyneuropathy: Secondary | ICD-10-CM | POA: Diagnosis not present

## 2019-12-23 DIAGNOSIS — Z79811 Long term (current) use of aromatase inhibitors: Secondary | ICD-10-CM | POA: Insufficient documentation

## 2019-12-23 DIAGNOSIS — Z9011 Acquired absence of right breast and nipple: Secondary | ICD-10-CM | POA: Insufficient documentation

## 2019-12-23 DIAGNOSIS — Z7982 Long term (current) use of aspirin: Secondary | ICD-10-CM | POA: Diagnosis not present

## 2019-12-23 DIAGNOSIS — Z90710 Acquired absence of both cervix and uterus: Secondary | ICD-10-CM | POA: Diagnosis not present

## 2019-12-23 DIAGNOSIS — E1169 Type 2 diabetes mellitus with other specified complication: Secondary | ICD-10-CM

## 2019-12-23 DIAGNOSIS — T451X5A Adverse effect of antineoplastic and immunosuppressive drugs, initial encounter: Secondary | ICD-10-CM | POA: Diagnosis not present

## 2019-12-23 DIAGNOSIS — R232 Flushing: Secondary | ICD-10-CM | POA: Diagnosis not present

## 2019-12-23 DIAGNOSIS — C7951 Secondary malignant neoplasm of bone: Secondary | ICD-10-CM

## 2019-12-23 DIAGNOSIS — R739 Hyperglycemia, unspecified: Secondary | ICD-10-CM | POA: Diagnosis not present

## 2019-12-23 DIAGNOSIS — M879 Osteonecrosis, unspecified: Secondary | ICD-10-CM | POA: Diagnosis not present

## 2019-12-23 DIAGNOSIS — C50911 Malignant neoplasm of unspecified site of right female breast: Secondary | ICD-10-CM | POA: Insufficient documentation

## 2019-12-23 DIAGNOSIS — Z90722 Acquired absence of ovaries, bilateral: Secondary | ICD-10-CM | POA: Diagnosis not present

## 2019-12-23 DIAGNOSIS — I700 Atherosclerosis of aorta: Secondary | ICD-10-CM | POA: Insufficient documentation

## 2019-12-23 DIAGNOSIS — Z794 Long term (current) use of insulin: Secondary | ICD-10-CM | POA: Insufficient documentation

## 2019-12-23 DIAGNOSIS — Z79899 Other long term (current) drug therapy: Secondary | ICD-10-CM | POA: Diagnosis not present

## 2019-12-23 LAB — CBC WITH DIFFERENTIAL/PLATELET
Abs Immature Granulocytes: 0.04 10*3/uL (ref 0.00–0.07)
Basophils Absolute: 0.1 10*3/uL (ref 0.0–0.1)
Basophils Relative: 3 %
Eosinophils Absolute: 0 10*3/uL (ref 0.0–0.5)
Eosinophils Relative: 1 %
HCT: 32.8 % — ABNORMAL LOW (ref 36.0–46.0)
Hemoglobin: 10.6 g/dL — ABNORMAL LOW (ref 12.0–15.0)
Immature Granulocytes: 1 %
Lymphocytes Relative: 11 %
Lymphs Abs: 0.3 10*3/uL — ABNORMAL LOW (ref 0.7–4.0)
MCH: 34.6 pg — ABNORMAL HIGH (ref 26.0–34.0)
MCHC: 32.3 g/dL (ref 30.0–36.0)
MCV: 107.2 fL — ABNORMAL HIGH (ref 80.0–100.0)
Monocytes Absolute: 0.2 10*3/uL (ref 0.1–1.0)
Monocytes Relative: 8 %
Neutro Abs: 2.4 10*3/uL (ref 1.7–7.7)
Neutrophils Relative %: 76 %
Platelets: 443 10*3/uL — ABNORMAL HIGH (ref 150–400)
RBC: 3.06 MIL/uL — ABNORMAL LOW (ref 3.87–5.11)
RDW: 15.8 % — ABNORMAL HIGH (ref 11.5–15.5)
WBC: 3.1 10*3/uL — ABNORMAL LOW (ref 4.0–10.5)
nRBC: 0 % (ref 0.0–0.2)

## 2019-12-23 LAB — COMPREHENSIVE METABOLIC PANEL
ALT: 15 U/L (ref 0–44)
AST: 18 U/L (ref 15–41)
Albumin: 3.4 g/dL — ABNORMAL LOW (ref 3.5–5.0)
Alkaline Phosphatase: 65 U/L (ref 38–126)
Anion gap: 10 (ref 5–15)
BUN: 18 mg/dL (ref 8–23)
CO2: 24 mmol/L (ref 22–32)
Calcium: 9.3 mg/dL (ref 8.9–10.3)
Chloride: 106 mmol/L (ref 98–111)
Creatinine, Ser: 1.12 mg/dL — ABNORMAL HIGH (ref 0.44–1.00)
GFR calc Af Amer: >60 mL/min (ref >60)
GFR calc non Af Amer: 52 mL/min — ABNORMAL LOW (ref >60)
Glucose, Bld: 126 mg/dL — ABNORMAL HIGH (ref 70–99)
Potassium: 4.5 mmol/L (ref 3.5–5.1)
Sodium: 140 mmol/L (ref 135–145)
Total Bilirubin: 0.4 mg/dL (ref 0.3–1.2)
Total Protein: 6.7 g/dL (ref 6.5–8.1)

## 2019-12-23 MED ORDER — OXYCODONE HCL ER 20 MG PO T12A: 20.0000 mg | EXTENDED_RELEASE_TABLET | Freq: Two times a day (BID) | ORAL | 0 refills | Status: DC

## 2019-12-24 ENCOUNTER — Telehealth: Payer: Self-pay | Admitting: Oncology

## 2019-12-24 LAB — CANCER ANTIGEN 27.29: CA 27.29: 129.2 U/mL — ABNORMAL HIGH (ref 0.0–38.6)

## 2019-12-24 NOTE — Telephone Encounter (Signed)
Scheduled appts per 3/24 los. Pt confirmed new appt dates and times.

## 2019-12-25 ENCOUNTER — Other Ambulatory Visit: Payer: Self-pay

## 2019-12-25 MED ORDER — LANTUS SOLOSTAR 100 UNIT/ML ~~LOC~~ SOPN: PEN_INJECTOR | SUBCUTANEOUS | 1 refills | Status: DC

## 2019-12-25 NOTE — Telephone Encounter (Signed)
Last VV 10/12/2019 Last fill 07/20/19  #55ml/1

## 2020-01-04 ENCOUNTER — Other Ambulatory Visit: Payer: Self-pay

## 2020-01-04 MED ORDER — LOSARTAN POTASSIUM 50 MG PO TABS: 50.0000 mg | ORAL_TABLET | Freq: Two times a day (BID) | ORAL | 1 refills | Status: DC

## 2020-01-04 NOTE — Telephone Encounter (Signed)
Last vv 10/12/19 Last fill 10/27/19  #180/1

## 2020-01-06 ENCOUNTER — Other Ambulatory Visit: Payer: Self-pay

## 2020-01-06 MED ORDER — LOSARTAN POTASSIUM 50 MG PO TABS: 50.0000 mg | ORAL_TABLET | Freq: Two times a day (BID) | ORAL | 0 refills | Status: DC

## 2020-01-06 NOTE — Telephone Encounter (Signed)
Last VV 10/12/19 Last fill 01/04/20 #180/1 Next OV 02/10/20

## 2020-01-08 ENCOUNTER — Telehealth: Payer: Self-pay | Admitting: *Deleted

## 2020-01-08 ENCOUNTER — Encounter: Payer: Self-pay | Admitting: Oncology

## 2020-01-08 MED ORDER — CIPROFLOXACIN HCL 500 MG PO TABS: 500.0000 mg | ORAL_TABLET | Freq: Two times a day (BID) | ORAL | 0 refills | Status: DC

## 2020-01-08 NOTE — Telephone Encounter (Signed)
This RN per MD review informed pt that a prescription for an antibiotic will be sent to her pharmacy.  She verified pharmacy on file as correct as well as no known allergy to Cipro.  She will stop her Leslee [facility] ( she is in her 2 week of 3 week cycle ) and call on Monday 4/12 per MD request with update and further advice regarding resuming the Ibrance.  No further needs at this time.  Prescription sent per MD instructions.

## 2020-01-11 ENCOUNTER — Telehealth: Payer: Self-pay | Admitting: *Deleted

## 2020-01-11 NOTE — Progress Notes (Signed)
[facility] Progress Note   Pegeen Stiger [id number] [birth year] 64 y.o.  Jayona Mccaig is managed by Dr. [name]  Actively treated with chemotherapy/immunotherapy/hormonal therapy: yes  Current therapy: Letrozole, palbociclib  Next scheduled appointment with provider: 02/17/2020  Assessment: Plan:    Metastatic breast cancer ([place])  Bone metastases ([place])  Malignant neoplasm metastatic to lung, unspecified laterality ([place])   Metastatic breast cancer with bone and lung mets: The patient continues to be followed by Dr. [name] and is treated with Letrozole and palbociclib.  She is scheduled to be seen in follow-up on 02/17/2019.  The patient was instructed that she can restart her [place] tomorrow.  Cellulitis: The patient was told to stop Cipro and was begun on Bactrim DS p.o. twice daily x7 days.  Please see After Visit Summary for patient specific instructions.  Future Appointments  Date Time Provider [place]  01/12/2020  1:00 PM Harle Stanford., PA-C [facility] None  01/20/2020  1:15 PM [facility] LAB 1 [facility] None  02/10/2020  1:00 PM Ronnald Nian, DO [facility]  02/17/2020 11:30 AM [facility] LAB 1 [facility] None  02/17/2020 12:00 PM Magrinat, Virgie Dad, MD [facility] None  03/16/2020  1:15 PM [facility] LAB 4 [facility] None  04/13/2020  1:15 PM [facility] LAB 4 [facility] None    No orders of the defined types were placed in this encounter.      Subjective:   Patient ID:  Samyiah Halvorsen is a 64 y.o. (DOB 1956-04-12) female.  Chief Complaint: No chief complaint on file.   HPI Jenney Brester is a 64 y.o. female with a diagnosis of a metastatic breast cancer with bone and lung mets.  She is followed by Dr. [name] and is treated with Letrozole and palbociclib.  She contacted her office yesterday stating that she continued to have a rash on her right arm.  She has recently been treated with Cipro for a  cellulitis.  She reports that the rash on her left arm is slightly pruritic and is less prevalent now than it was before starting Cipro.  She has been holding Ibrance since she has been treated with Cipro.  Medications: I have reviewed the patient's current medications.  Allergies:  Allergies  Allergen Reactions  . Morphine And Related Anaphylaxis    Past Medical History:  Diagnosis Date  . Breast cancer metastasized to bone ([place])   . Hyperlipidemia   . Hypertension   . Lymphedema of right arm   . Neuropathy associated with cancer ([place])   . Obesity (BMI 35.0-39.9 without comorbidity)     Past Surgical History:  Procedure Laterality Date  . CATARACT EXTRACTION Left   . HYSTERECTOMY ABDOMINAL WITH SALPINGO-OOPHORECTOMY    . MODIFIED RADICAL MASTECTOMY Right     Family History  Problem Relation Age of Onset  . Diabetes Mother   . Breast cancer Mother 62  . Cerebral aneurysm Mother   . Pulmonary embolism Father   . Colon cancer Father 70  . Breast cancer Sister 18       noninvasive  . Breast cancer Paternal Grandmother 56    Social History   Socioeconomic History  . Marital status: Married    Spouse name: Not on file  . Number of children: Not on file  . Years of education: Not on file  . Highest education level: Not on file  Occupational History  . Not on file  Tobacco Use  . Smoking status: Never Smoker  . Smokeless tobacco: Never Used  Substance and Sexual Activity  . Alcohol use: Not Currently  . Drug use: Never  . Sexual activity: Not Currently  Other Topics Concern  . Not on file  Social History Narrative  . Not on file   Social Determinants of Health   Financial Resource Strain:   . Difficulty of Paying Living Expenses:   Food Insecurity:   . Worried About Charity fundraiser in the Last Year:   . Arboriculturist in the Last Year:   Transportation Needs:   . Film/video editor (Medical):   Marland Kitchen Lack of Transportation (Non-Medical):   Physical  Activity:   . Days of Exercise per Week:   . Minutes of Exercise per Session:   Stress:   . Feeling of Stress :   Social Connections:   . Frequency of Communication with Friends and Family:   . Frequency of Social Gatherings with Friends and Family:   . Attends Religious Services:   . Active Member of Clubs or Organizations:   . Attends Archivist Meetings:   Marland Kitchen Marital Status:   Intimate Partner Violence:   . Fear of Current or Ex-Partner:   . Emotionally Abused:   Marland Kitchen Physically Abused:   . Sexually Abused:     Past Medical History, Surgical history, Social history, and Family history were reviewed and updated as appropriate.   Please see review of systems for further details on the patient's review from today.   Review of Systems:  Review of Systems  Constitutional: Negative for chills, diaphoresis and fever.  HENT: Negative for facial swelling and trouble swallowing.   Respiratory: Negative for cough, chest tightness and shortness of breath.   Cardiovascular: Negative for chest pain.  Skin: Positive for rash.    Objective:   Physical Exam:  LMP 10/01/2006 Comment: Total Hysterectomy ECOG: 0  Physical Exam Constitutional:      General: She is not in acute distress.    Appearance: Normal appearance. She is not ill-appearing.  HENT:     Head: Normocephalic and atraumatic.  Eyes:     General: No scleral icterus.       Right eye: No discharge.        Left eye: No discharge.     Conjunctiva/sclera: Conjunctivae normal.  Skin:    Findings: Erythema present.     Comments: Diffuse erythema with increased warmth is noted over the patient's left upper extremity.  Neurological:     Mental Status: She is alert.     Coordination: Coordination normal.     Gait: Gait normal.  Psychiatric:        Mood and Affect: Mood normal.        Behavior: Behavior normal.        Thought Content: Thought content normal.        Judgment: Judgment normal.     Lab Review:      Component Value Date/Time   NA 140 12/23/2019 1350   NA 140 01/31/2018 0000   K 4.5 12/23/2019 1350   CL 106 12/23/2019 1350   CO2 24 12/23/2019 1350   GLUCOSE 126 (H) 12/23/2019 1350   BUN 18 12/23/2019 1350   BUN 22 (A) 01/31/2018 0000   CREATININE 1.12 (H) 12/23/2019 1350   CREATININE 1.05 (H) 03/24/2019 0833   CALCIUM 9.3 12/23/2019 1350   PROT 6.7 12/23/2019 1350   ALBUMIN 3.4 (L) 12/23/2019 1350   AST 18 12/23/2019 1350   AST 15 03/24/2019 0833  ALT 15 12/23/2019 1350   ALT 16 03/24/2019 0833   ALKPHOS 65 12/23/2019 1350   BILITOT 0.4 12/23/2019 1350   BILITOT 0.3 03/24/2019 0833   GFRNONAA 52 (L) 12/23/2019 1350   GFRNONAA 57 (L) 03/24/2019 0833   GFRAA >60 12/23/2019 1350   GFRAA >60 03/24/2019 0833       Component Value Date/Time   WBC 3.1 (L) 12/23/2019 1350   RBC 3.06 (L) 12/23/2019 1350   HGB 10.6 (L) 12/23/2019 1350   HCT 32.8 (L) 12/23/2019 1350   PLT 443 (H) 12/23/2019 1350   MCV 107.2 (H) 12/23/2019 1350   MCH 34.6 (H) 12/23/2019 1350   MCHC 32.3 12/23/2019 1350   RDW 15.8 (H) 12/23/2019 1350   LYMPHSABS 0.3 (L) 12/23/2019 1350   MONOABS 0.2 12/23/2019 1350   EOSABS 0.0 12/23/2019 1350   BASOSABS 0.1 12/23/2019 1350   -------------------------------  Imaging from last 24 hours (if applicable):  Radiology interpretation: No results found.

## 2020-01-11 NOTE — Telephone Encounter (Addendum)
Received vm earlier from pt stating that was reporting back to let us know about her rash on her R arm.  She is on Cipro & has enough throw tomorrow.  She states that rash is still there but not as prevalent & now is on her L arm.  It is slightly itchy.  Her [facility] is on hold.  Message to Dr [name] for recommendations.  Per Dr [name], Pt needs to be seen in symptom management tomorrow.  Message sent to schedulers & Symptom Management informed.

## 2020-01-12 ENCOUNTER — Other Ambulatory Visit: Payer: Self-pay

## 2020-01-12 ENCOUNTER — Inpatient Hospital Stay: Payer: MEDICARE | Attending: Oncology | Admitting: Medical

## 2020-01-12 VITALS — BP 138/85 | HR 88 | Temp 98.0°F | Resp 18 | Wt 220.5 lb

## 2020-01-12 DIAGNOSIS — E669 Obesity, unspecified: Secondary | ICD-10-CM | POA: Diagnosis not present

## 2020-01-12 DIAGNOSIS — C50911 Malignant neoplasm of unspecified site of right female breast: Secondary | ICD-10-CM | POA: Diagnosis not present

## 2020-01-12 DIAGNOSIS — C7800 Secondary malignant neoplasm of unspecified lung: Secondary | ICD-10-CM | POA: Insufficient documentation

## 2020-01-12 DIAGNOSIS — I890 Lymphedema, not elsewhere classified: Secondary | ICD-10-CM | POA: Diagnosis not present

## 2020-01-12 DIAGNOSIS — I10 Essential (primary) hypertension: Secondary | ICD-10-CM | POA: Diagnosis not present

## 2020-01-12 DIAGNOSIS — Z170 Estrogen receptor positive status [ER+]: Secondary | ICD-10-CM | POA: Diagnosis not present

## 2020-01-12 DIAGNOSIS — E785 Hyperlipidemia, unspecified: Secondary | ICD-10-CM | POA: Insufficient documentation

## 2020-01-12 DIAGNOSIS — R21 Rash and other nonspecific skin eruption: Secondary | ICD-10-CM | POA: Insufficient documentation

## 2020-01-12 DIAGNOSIS — C50919 Malignant neoplasm of unspecified site of unspecified female breast: Secondary | ICD-10-CM | POA: Diagnosis not present

## 2020-01-12 DIAGNOSIS — C7951 Secondary malignant neoplasm of bone: Secondary | ICD-10-CM | POA: Diagnosis not present

## 2020-01-12 DIAGNOSIS — Z79811 Long term (current) use of aromatase inhibitors: Secondary | ICD-10-CM | POA: Diagnosis not present

## 2020-01-12 MED ORDER — SULFAMETHOXAZOLE-TRIMETHOPRIM 800-160 MG PO TABS: 1.0000 | ORAL_TABLET | Freq: Two times a day (BID) | ORAL | 0 refills | Status: DC

## 2020-01-13 ENCOUNTER — Encounter: Payer: Self-pay | Admitting: Family Medicine

## 2020-01-13 MED ORDER — INSULIN PEN NEEDLE 31G X 5 MM MISC: 99 refills | Status: DC

## 2020-01-15 ENCOUNTER — Telehealth: Payer: Self-pay | Admitting: Emergency Medicine

## 2020-01-15 NOTE — Telephone Encounter (Signed)
Returning pt's call reporting missing necklace from visits to [facility] on 01/12/20, pt informed that we have not found a necklace in the [facility]/infusion area but that we would keep an eye out for it.

## 2020-01-20 ENCOUNTER — Other Ambulatory Visit: Payer: Self-pay

## 2020-01-20 ENCOUNTER — Inpatient Hospital Stay: Payer: MEDICARE

## 2020-01-20 DIAGNOSIS — C7951 Secondary malignant neoplasm of bone: Secondary | ICD-10-CM

## 2020-01-20 DIAGNOSIS — C7800 Secondary malignant neoplasm of unspecified lung: Secondary | ICD-10-CM | POA: Diagnosis not present

## 2020-01-20 DIAGNOSIS — Z170 Estrogen receptor positive status [ER+]: Secondary | ICD-10-CM | POA: Diagnosis not present

## 2020-01-20 DIAGNOSIS — M879 Osteonecrosis, unspecified: Secondary | ICD-10-CM

## 2020-01-20 DIAGNOSIS — C50919 Malignant neoplasm of unspecified site of unspecified female breast: Secondary | ICD-10-CM

## 2020-01-20 DIAGNOSIS — R21 Rash and other nonspecific skin eruption: Secondary | ICD-10-CM | POA: Diagnosis not present

## 2020-01-20 DIAGNOSIS — Z79811 Long term (current) use of aromatase inhibitors: Secondary | ICD-10-CM | POA: Diagnosis not present

## 2020-01-20 DIAGNOSIS — C50911 Malignant neoplasm of unspecified site of right female breast: Secondary | ICD-10-CM | POA: Diagnosis not present

## 2020-01-20 LAB — CBC WITH DIFFERENTIAL/PLATELET
Abs Immature Granulocytes: 0.03 10*3/uL (ref 0.00–0.07)
Basophils Absolute: 0.1 10*3/uL (ref 0.0–0.1)
Basophils Relative: 2 %
Eosinophils Absolute: 0 10*3/uL (ref 0.0–0.5)
Eosinophils Relative: 1 %
HCT: 32.4 % — ABNORMAL LOW (ref 36.0–46.0)
Hemoglobin: 10.2 g/dL — ABNORMAL LOW (ref 12.0–15.0)
Immature Granulocytes: 1 %
Lymphocytes Relative: 14 %
Lymphs Abs: 0.6 10*3/uL — ABNORMAL LOW (ref 0.7–4.0)
MCH: 34 pg (ref 26.0–34.0)
MCHC: 31.5 g/dL (ref 30.0–36.0)
MCV: 108 fL — ABNORMAL HIGH (ref 80.0–100.0)
Monocytes Absolute: 0.4 10*3/uL (ref 0.1–1.0)
Monocytes Relative: 9 %
Neutro Abs: 3.3 10*3/uL (ref 1.7–7.7)
Neutrophils Relative %: 73 %
Platelets: 371 10*3/uL (ref 150–400)
RBC: 3 MIL/uL — ABNORMAL LOW (ref 3.87–5.11)
RDW: 16.2 % — ABNORMAL HIGH (ref 11.5–15.5)
WBC: 4.4 10*3/uL (ref 4.0–10.5)
nRBC: 0 % (ref 0.0–0.2)

## 2020-01-20 LAB — COMPREHENSIVE METABOLIC PANEL
ALT: 33 U/L (ref 0–44)
AST: 31 U/L (ref 15–41)
Albumin: 3.3 g/dL — ABNORMAL LOW (ref 3.5–5.0)
Alkaline Phosphatase: 88 U/L (ref 38–126)
Anion gap: 9 (ref 5–15)
BUN: 15 mg/dL (ref 8–23)
CO2: 23 mmol/L (ref 22–32)
Calcium: 9.6 mg/dL (ref 8.9–10.3)
Chloride: 105 mmol/L (ref 98–111)
Creatinine, Ser: 1.16 mg/dL — ABNORMAL HIGH (ref 0.44–1.00)
GFR calc Af Amer: 58 mL/min — ABNORMAL LOW (ref >60)
GFR calc non Af Amer: 50 mL/min — ABNORMAL LOW (ref >60)
Glucose, Bld: 114 mg/dL — ABNORMAL HIGH (ref 70–99)
Potassium: 6 mmol/L — ABNORMAL HIGH (ref 3.5–5.1)
Sodium: 137 mmol/L (ref 135–145)
Total Bilirubin: 0.3 mg/dL (ref 0.3–1.2)
Total Protein: 6.9 g/dL (ref 6.5–8.1)

## 2020-01-21 LAB — CANCER ANTIGEN 27.29: CA 27.29: 129.4 U/mL — ABNORMAL HIGH (ref 0.0–38.6)

## 2020-01-27 ENCOUNTER — Other Ambulatory Visit: Payer: Self-pay | Admitting: *Deleted

## 2020-01-27 ENCOUNTER — Other Ambulatory Visit: Payer: Self-pay | Admitting: Oncology

## 2020-01-27 ENCOUNTER — Encounter: Payer: Self-pay | Admitting: Oncology

## 2020-01-27 DIAGNOSIS — C50919 Malignant neoplasm of unspecified site of unspecified female breast: Secondary | ICD-10-CM

## 2020-01-27 MED ORDER — OXYCODONE-ACETAMINOPHEN 10-325 MG PO TABS: 1.0000 | ORAL_TABLET | Freq: Four times a day (QID) | ORAL | 0 refills | Status: DC | PRN

## 2020-01-28 ENCOUNTER — Encounter: Payer: Self-pay | Admitting: Oncology

## 2020-01-28 ENCOUNTER — Other Ambulatory Visit: Payer: Self-pay | Admitting: Oncology

## 2020-01-28 MED ORDER — OXYCODONE HCL ER 20 MG PO T12A: 20.0000 mg | EXTENDED_RELEASE_TABLET | Freq: Two times a day (BID) | ORAL | 0 refills | Status: DC

## 2020-02-09 ENCOUNTER — Other Ambulatory Visit: Payer: Self-pay

## 2020-02-10 ENCOUNTER — Ambulatory Visit (INDEPENDENT_AMBULATORY_CARE_PROVIDER_SITE_OTHER): Payer: MEDICARE | Admitting: Family Medicine

## 2020-02-10 ENCOUNTER — Encounter: Payer: Self-pay | Admitting: Family Medicine

## 2020-02-10 VITALS — BP 110/80 | HR 79 | Temp 96.9°F | Ht 64.0 in | Wt 220.6 lb

## 2020-02-10 DIAGNOSIS — C50919 Malignant neoplasm of unspecified site of unspecified female breast: Secondary | ICD-10-CM | POA: Diagnosis not present

## 2020-02-10 DIAGNOSIS — E669 Obesity, unspecified: Secondary | ICD-10-CM

## 2020-02-10 DIAGNOSIS — I10 Essential (primary) hypertension: Secondary | ICD-10-CM | POA: Diagnosis not present

## 2020-02-10 DIAGNOSIS — E1169 Type 2 diabetes mellitus with other specified complication: Secondary | ICD-10-CM | POA: Diagnosis not present

## 2020-02-10 DIAGNOSIS — E785 Hyperlipidemia, unspecified: Secondary | ICD-10-CM | POA: Diagnosis not present

## 2020-02-10 NOTE — Progress Notes (Signed)
Mia [name] is a 64 y.o. female  Chief Complaint  Patient presents with  . Establish Care    Pt here for TOC/follow up.  Pt would like to discuss medications.    HPI: Mia [name] is a 64 y.o. female who is seen today for [facility] appt, previous PCP Dr. Deborra Medina. Pt has a h/o DM, HTN, hyperlipidemia, metastatic breast cancer. She follows with oncology Dr. [name]. Pt has been off tricor x 2 mo (pt was taking for the past 7 years). Pt has also not been on pravastatin x 1 year.    Lab Results  Component Value Date   HGBA1C 6.1 06/18/2019   Lab Results  Component Value Date   CHOL 168 06/03/2018   HDL 49.70 06/03/2018   LDLCALC 85 06/03/2018   TRIG 163.0 (H) 06/03/2018   CHOLHDL 3 06/03/2018   No results found for: Wendall Papa  Lab Results  Component Value Date   BUN 15 01/20/2020   Lab Results  Component Value Date   CREATININE 1.16 (H) 01/20/2020   Lab Results  Component Value Date   NA 137 01/20/2020   K 6.0 (H) 01/20/2020   CL 105 01/20/2020   CO2 23 01/20/2020     Past Medical History:  Diagnosis Date  . Breast cancer metastasized to bone ([place])   . Hyperlipidemia   . Hypertension   . Lymphedema of right arm   . Neuropathy associated with cancer ([place])   . Obesity (BMI 35.0-39.9 without comorbidity)     Past Surgical History:  Procedure Laterality Date  . CATARACT EXTRACTION Left   . HYSTERECTOMY ABDOMINAL WITH SALPINGO-OOPHORECTOMY    . MODIFIED RADICAL MASTECTOMY Right     Social History   Socioeconomic History  . Marital status: Married    Spouse name: Not on file  . Number of children: Not on file  . Years of education: Not on file  . Highest education level: Not on file  Occupational History  . Not on file  Tobacco Use  . Smoking status: Never Smoker  . Smokeless tobacco: Never Used  Substance and Sexual Activity  . Alcohol use: Not Currently  . Drug use: Never  . Sexual activity: Not Currently  Other Topics Concern    . Not on file  Social History Narrative  . Not on file   Social Determinants of Health   Financial Resource Strain:   . Difficulty of Paying Living Expenses:   Food Insecurity:   . Worried About Charity fundraiser in the Last Year:   . Arboriculturist in the Last Year:   Transportation Needs:   . Film/video editor (Medical):   Marland Kitchen Lack of Transportation (Non-Medical):   Physical Activity:   . Days of Exercise per Week:   . Minutes of Exercise per Session:   Stress:   . Feeling of Stress :   Social Connections:   . Frequency of Communication with Friends and Family:   . Frequency of Social Gatherings with Friends and Family:   . Attends Religious Services:   . Active Member of Clubs or Organizations:   . Attends Archivist Meetings:   Marland Kitchen Marital Status:   Intimate Partner Violence:   . Fear of Current or Ex-Partner:   . Emotionally Abused:   Marland Kitchen Physically Abused:   . Sexually Abused:     Family History  Problem Relation Age of Onset  . Diabetes Mother   . Breast cancer Mother 76  .  Cerebral aneurysm Mother   . Pulmonary embolism Father   . Colon cancer Father 57  . Breast cancer Sister 66       noninvasive  . Breast cancer Paternal Grandmother 83     Immunization History  Administered Date(s) Administered  . Influenza,inj,Quad PF,6+ Mos 09/02/2018, 07/14/2019  . Influenza-Unspecified 06/30/2010, 08/12/2013, 07/02/2014, 07/13/2015, 07/18/2016  . Pneumococcal Polysaccharide-23 08/11/2008  . Td 10/20/2002  . Tdap 06/24/2012    Outpatient Encounter Medications as of 02/10/2020  Medication Sig  . amlodipine-atorvastatin (CADUET) 10-10 MG tablet Take 1 tablet by mouth daily.  Marland Kitchen aspirin 81 MG chewable tablet Chew by mouth daily.  . fenofibrate (TRICOR) 145 MG tablet Take 145 mg by mouth daily.  . fexofenadine (ALLEGRA ALLERGY) 60 MG tablet Take 1 tablet (60 mg total) by mouth 2 (two) times daily.  . fluticasone (FLONASE) 50 MCG/ACT nasal spray SPRAY 2  SPRAYS INTO EACH NOSTRIL EVERY DAY  . gabapentin (NEURONTIN) 300 MG capsule Take 1 capsule (300 mg total) by mouth 2 (two) times daily. Pt is only taking 2 capsules a day  . IBRANCE 125 MG tablet TAKE 1 TABLET DAILY WITH BREAKFAST. TAKE WHOLE WITH FOOD FOR 21 DAYS ON AND 7 DAYS OFF EVERY 28 DAYS  . Insulin Admin Supplies MISC Inject 24 Units into the skin daily.  . insulin glargine (LANTUS SOLOSTAR) 100 UNIT/ML Solostar Pen NJECT 24 UNITS INTO THE SKIN DAILY  . Insulin Pen Needle 31G X 5 MM MISC UAD for Sq inj for DM qd  . letrozole (FEMARA) 2.5 MG tablet Take 1 tablet (2.5 mg total) by mouth daily.  Marland Kitchen losartan (COZAAR) 50 MG tablet Take 1 tablet (50 mg total) by mouth 2 (two) times daily.  . metoprolol succinate (TOPROL-XL) 25 MG 24 hr tablet Take 1 tablet (25 mg total) by mouth daily.  Marland Kitchen oxyCODONE (OXYCONTIN) 20 mg 12 hr tablet Take 1 tablet (20 mg total) by mouth every 12 (twelve) hours.  Marland Kitchen oxyCODONE-acetaminophen (PERCOCET) 10-325 MG tablet Take 1 tablet by mouth every 6 (six) hours as needed for pain.  . pantoprazole (PROTONIX) 40 MG tablet Take 1 tablet (40 mg total) by mouth daily.  . pioglitazone (ACTOS) 15 MG tablet Take 1 tablet (15 mg total) by mouth daily.  . pravastatin (PRAVACHOL) 40 MG tablet Take 40 mg by mouth daily.  . sitaGLIPtin-metformin (JANUMET) 50-1000 MG tablet Take 1 tablet by mouth daily with breakfast.  . [DISCONTINUED] ciprofloxacin (CIPRO) 500 MG tablet Take 1 tablet (500 mg total) by mouth 2 (two) times daily.  . [DISCONTINUED] loratadine (CLARITIN) 10 MG tablet Take 10 mg by mouth daily.  . [DISCONTINUED] sulfamethoxazole-trimethoprim (BACTRIM DS) 800-160 MG tablet Take 1 tablet by mouth 2 (two) times daily.   No facility-administered encounter medications on file as of 02/10/2020.     ROS: Pertinent positives and negatives noted in HPI. Remainder of ROS non-contributory    Allergies  Allergen Reactions  . Morphine And Related Anaphylaxis    BP 110/80  (BP Location: Left Arm, Patient Position: Sitting, Cuff Size: Normal)   Pulse 79   Temp (!) 96.9 F (36.1 C) (Temporal)   Ht 5\' 4"  (1.626 m)   Wt 220 lb 9.6 oz (100.1 kg)   LMP 10/01/2006 Comment: Total Hysterectomy  SpO2 96%   BMI 37.87 kg/m   BP Readings from Last 3 Encounters:  02/10/20 110/80  01/12/20 138/85  12/23/19 126/64   Pulse Readings from Last 3 Encounters:  02/10/20 79  01/12/20 88  12/23/19 76   Wt Readings from Last 3 Encounters:  02/10/20 220 lb 9.6 oz (100.1 kg)  01/12/20 220 lb 8 oz (100 kg)  12/23/19 224 lb 11.2 oz (101.9 kg)     Physical Exam  Constitutional: She is oriented to person, place, and time. She appears well-developed and well-nourished. No distress.  Cardiovascular: Normal rate, regular rhythm, normal heart sounds and intact distal pulses.  Pulmonary/Chest: Effort normal and breath sounds normal. No respiratory distress.  Musculoskeletal:        General: No edema.     Cervical back: Neck supple.  Neurological: She is alert and oriented to person, place, and time.  Psychiatric: She has a normal mood and affect. Her behavior is normal.     A/P:  1. Hypertension, unspecified type - controlled, at goal - cont current med  2. Hyperlipidemia, unspecified hyperlipidemia type - has been off statin x 1 year, tricor x 2 mo - Lipid panel; Future  3. Metastatic breast cancer ([place]) - follows with oncology Dr. [name]  4. Diabetes mellitus type 2 in obese ([facility]) - Hemoglobin A1c; Future - Microalbumin / creatinine urine ratio; Future - cont current med    This visit occurred during the SARS-CoV-2 public health emergency.  Safety protocols were in place, including screening questions prior to the visit, additional usage of staff PPE, and extensive cleaning of exam room while observing appropriate contact time as indicated for disinfecting solutions.

## 2020-02-17 ENCOUNTER — Inpatient Hospital Stay (HOSPITAL_BASED_OUTPATIENT_CLINIC_OR_DEPARTMENT_OTHER): Payer: Medicare Other | Admitting: Oncology

## 2020-02-17 ENCOUNTER — Other Ambulatory Visit: Payer: Self-pay

## 2020-02-17 ENCOUNTER — Inpatient Hospital Stay: Payer: Medicare Other | Attending: Oncology

## 2020-02-17 VITALS — BP 117/99 | HR 73 | Temp 98.5°F | Resp 18 | Ht 64.0 in | Wt 221.3 lb

## 2020-02-17 DIAGNOSIS — C50911 Malignant neoplasm of unspecified site of right female breast: Secondary | ICD-10-CM | POA: Insufficient documentation

## 2020-02-17 DIAGNOSIS — M879 Osteonecrosis, unspecified: Secondary | ICD-10-CM

## 2020-02-17 DIAGNOSIS — C7800 Secondary malignant neoplasm of unspecified lung: Secondary | ICD-10-CM

## 2020-02-17 DIAGNOSIS — Z90722 Acquired absence of ovaries, bilateral: Secondary | ICD-10-CM | POA: Diagnosis not present

## 2020-02-17 DIAGNOSIS — I10 Essential (primary) hypertension: Secondary | ICD-10-CM | POA: Insufficient documentation

## 2020-02-17 DIAGNOSIS — Z90710 Acquired absence of both cervix and uterus: Secondary | ICD-10-CM | POA: Diagnosis not present

## 2020-02-17 DIAGNOSIS — Z79811 Long term (current) use of aromatase inhibitors: Secondary | ICD-10-CM | POA: Diagnosis not present

## 2020-02-17 DIAGNOSIS — G893 Neoplasm related pain (acute) (chronic): Secondary | ICD-10-CM | POA: Diagnosis not present

## 2020-02-17 DIAGNOSIS — E1169 Type 2 diabetes mellitus with other specified complication: Secondary | ICD-10-CM | POA: Diagnosis not present

## 2020-02-17 DIAGNOSIS — Z170 Estrogen receptor positive status [ER+]: Secondary | ICD-10-CM | POA: Diagnosis not present

## 2020-02-17 DIAGNOSIS — C50919 Malignant neoplasm of unspecified site of unspecified female breast: Secondary | ICD-10-CM

## 2020-02-17 DIAGNOSIS — Z803 Family history of malignant neoplasm of breast: Secondary | ICD-10-CM | POA: Insufficient documentation

## 2020-02-17 DIAGNOSIS — E785 Hyperlipidemia, unspecified: Secondary | ICD-10-CM | POA: Diagnosis not present

## 2020-02-17 DIAGNOSIS — K760 Fatty (change of) liver, not elsewhere classified: Secondary | ICD-10-CM | POA: Insufficient documentation

## 2020-02-17 DIAGNOSIS — Z9011 Acquired absence of right breast and nipple: Secondary | ICD-10-CM | POA: Diagnosis not present

## 2020-02-17 DIAGNOSIS — Z7982 Long term (current) use of aspirin: Secondary | ICD-10-CM | POA: Insufficient documentation

## 2020-02-17 DIAGNOSIS — E669 Obesity, unspecified: Secondary | ICD-10-CM | POA: Diagnosis not present

## 2020-02-17 DIAGNOSIS — I700 Atherosclerosis of aorta: Secondary | ICD-10-CM | POA: Diagnosis not present

## 2020-02-17 DIAGNOSIS — C7951 Secondary malignant neoplasm of bone: Secondary | ICD-10-CM | POA: Diagnosis not present

## 2020-02-17 DIAGNOSIS — K5900 Constipation, unspecified: Secondary | ICD-10-CM | POA: Insufficient documentation

## 2020-02-17 DIAGNOSIS — C778 Secondary and unspecified malignant neoplasm of lymph nodes of multiple regions: Secondary | ICD-10-CM | POA: Diagnosis not present

## 2020-02-17 DIAGNOSIS — Z79899 Other long term (current) drug therapy: Secondary | ICD-10-CM | POA: Diagnosis not present

## 2020-02-17 LAB — CBC WITH DIFFERENTIAL/PLATELET
Abs Immature Granulocytes: 0.02 10*3/uL (ref 0.00–0.07)
Basophils Absolute: 0.1 10*3/uL (ref 0.0–0.1)
Basophils Relative: 2 %
Eosinophils Absolute: 0 10*3/uL (ref 0.0–0.5)
Eosinophils Relative: 1 %
HCT: 31.9 % — ABNORMAL LOW (ref 36.0–46.0)
Hemoglobin: 10.5 g/dL — ABNORMAL LOW (ref 12.0–15.0)
Immature Granulocytes: 1 %
Lymphocytes Relative: 12 %
Lymphs Abs: 0.4 10*3/uL — ABNORMAL LOW (ref 0.7–4.0)
MCH: 34.7 pg — ABNORMAL HIGH (ref 26.0–34.0)
MCHC: 32.9 g/dL (ref 30.0–36.0)
MCV: 105.3 fL — ABNORMAL HIGH (ref 80.0–100.0)
Monocytes Absolute: 0.2 10*3/uL (ref 0.1–1.0)
Monocytes Relative: 7 %
Neutro Abs: 2.4 10*3/uL (ref 1.7–7.7)
Neutrophils Relative %: 77 %
Platelets: 319 10*3/uL (ref 150–400)
RBC: 3.03 MIL/uL — ABNORMAL LOW (ref 3.87–5.11)
RDW: 15.2 % (ref 11.5–15.5)
WBC: 3.1 10*3/uL — ABNORMAL LOW (ref 4.0–10.5)
nRBC: 0 % (ref 0.0–0.2)

## 2020-02-17 LAB — COMPREHENSIVE METABOLIC PANEL
ALT: 11 U/L (ref 0–44)
AST: 15 U/L (ref 15–41)
Albumin: 3.2 — AB (ref 3.5–5.0)
Albumin: 3.3 g/dL — ABNORMAL LOW (ref 3.5–5.0)
Alkaline Phosphatase: 86 U/L (ref 38–126)
Anion gap: 10 (ref 5–15)
BUN: 16 mg/dL (ref 8–23)
CO2: 24 mmol/L (ref 22–32)
Calcium: 9.2 mg/dL (ref 8.9–10.3)
Chloride: 104 mmol/L (ref 98–111)
Creatinine, Ser: 0.89 mg/dL (ref 0.44–1.00)
GFR calc Af Amer: >60 mL/min (ref >60)
GFR calc non Af Amer: >60 mL/min (ref >60)
Glucose, Bld: 89 mg/dL (ref 70–99)
Potassium: 4.5 mmol/L (ref 3.5–5.1)
Sodium: 138 mmol/L (ref 135–145)
Total Bilirubin: 0.4 mg/dL (ref 0.3–1.2)
Total Protein: 6.8 g/dL (ref 6.5–8.1)

## 2020-02-17 LAB — LIPID PANEL
Cholesterol: 199 (ref 0–200)
HDL: 41 (ref 35–70)
LDL Cholesterol: 121
Triglycerides: 210 — AB (ref 40–160)

## 2020-02-17 NOTE — Progress Notes (Signed)
[name]  [phone] [phone]    ID: Mia [name] DOB: [birth year]  MR#: [id number]  FUX#:[id number]  Patient Care Team: Ronnald Nian, DO as PCP - General (Family Medicine) Ramone Gander, Virgie Dad, MD as Consulting Physician (Oncology) [name], [name] PA   CHIEF COMPLAINT: Estrogen receptor positive stage IV breast cancer (s/p right mastectomy)  CURRENT TREATMENT: Letrozole, palbociclib   INTERVAL HISTORY: [name] returns today for follow-up of her etrogen receptor positive stage IV breast cancer.   She continues on letrozole.  Hot flashes are not a major issue for her and she has no vaginal dryness problems.  She also continues on palbociclib.  She is tolerating this with no unusual side effects that she is aware of.  She thinks she may have a little bit of nausea at times but that is about it as far as the palbociclib is concerned.  She does have baseline fatigue and it is not clear whether this makes it worse or not.  There is no bone density screening on file.  She is not receiving denosumab/Xgeva or zoledronate because of her history of osteonecrosis of the jaw.    We are following her CA 27-29. Lab Results  Component Value Date   CA2729 129.4 (H) 01/20/2020   CA2729 129.2 (H) 12/23/2019   CA2729 141.9 (H) 10/28/2019   CA2729 124.4 (H) 09/29/2019   CA2729 108.8 (H) 08/11/2019    REVIEW OF SYSTEMS: Mia [name] looks considerably more relaxed than before.  Since we started the OxyContin her pain has been much better controlled.  She gets occasionally constipated but she knows how to manage that.  She walks around the house at least hourly and she is going outside and going down 2 or 3 houses at a time and then coming back.  She has had both Pfizer vaccine doses and did well with those   HISTORY OF CURRENT ILLNESS: From the original intake note:  We have reviewed the medical records from [place], which is the source of the information below:  Mia [name] was initially diagnosed with Stage IIIA (T2N2M0) invasive ductal carcinoma, estrogen receptor positive and HER2 negative right breast cancer in 2000. She underwent right mastectomy, with 4 positive metastatic lymph nodes. She had chemotherapy with taxol and cytoxan and fulvestrant in the past. She had radiation and completed 5 years of tamoxifen.   She was diagnosed with Stage IV disease 04/18/2005 with metastases to the bone, lung, and additional nodes. She was on capecitabine but was discontinued after rising CA 27-29 and scans.   She started anastrozole and everolimus with Delton See in April 2014. She tolerated this well, but this was discontinued in July 2014 due to abnormal blood tests, hyperlipidemia and hyperglycemia.  She began Abraxane 178m /m2 and Xgeva in August 2014 weekly x3 with 1 week off. She tolerated this treatment well. She discontinued Xgeva January 2016 due to osteonecrosis of the jaw and mouth issues. She continued abraxane alone until her last dose on 11/18/2015 due to neuropathy and disease progression with restaging studies on 11/23/2015 with a CT chest abdomen and pelvis and one scan showing skull, sternal and femoral lesions.   She was switched to gemcitabine starting 01/20/2016 weekly x3 and 1 week off. This was discontinued on 06/15/2016 due to a port related jugular clot on the right side. She was given Lovenox for 3 months.  She was switched to Letrozole 2.5 mg and palbociclib 125 mg starting 07/13/2016. She  tolerated this well. Restaging studies with CT chest abdomen and pelvis and bone scan on 07/01/2017 showed stable/ improving lesions. CA 27-29 was stable.  During the last few months of follow up in [place], she completed restaging studies with a CT chest abdomen and pelvis at [facility] on 12/17/2017 showing: A 6 mm pulmonary nodule in the left upper lobe laterally. Progression of metastatic disease  to the bones at the L4 and L5 vertebral bodies. Sclerotic metastases at T8, T10, an T11, are similar to previous exams. Sclerotic metastases in the sternum stable. Hepatic steatosis. Aortic atherosclerosis.  Most recent CA-23-29 (January 2019) was 58.  Most recent hemoglobin A1c was 7.1 according to the patient  The patient's subsequent history is as detailed below.   PAST MEDICAL HISTORY: Past Medical History:  Diagnosis Date  . Breast cancer metastasized to bone ([place])   . Hyperlipidemia   . Hypertension   . Lymphedema of right arm   . Neuropathy associated with cancer ([place])   . Obesity (BMI 35.0-39.9 without comorbidity)      PAST SURGICAL HISTORY: Past Surgical History:  Procedure Laterality Date  . CATARACT EXTRACTION Left   . HYSTERECTOMY ABDOMINAL WITH SALPINGO-OOPHORECTOMY    . MODIFIED RADICAL MASTECTOMY Right      FAMILY HISTORY: Family History  Problem Relation Age of Onset  . Diabetes Mother   . Breast cancer Mother 34  . Cerebral aneurysm Mother   . Pulmonary embolism Father   . Colon cancer Father 92  . Breast cancer Sister 82       noninvasive  . Breast cancer Paternal Grandmother 78    The patient reports she had negative genetic testing at [facility] 2014, report not available. --The patient's father died at age 63 due to a PE, and he also had a history of colon cancer diagnosed at age 40. The patient's mother died at age 78 due to diabetes and survived a cerebral aneurysm. The patient's mother also had a history of breast cancer diagnosed at age 77.  The patient has no brothers and 1 sister. The patient's sister was diagnosed with non invasive breast cancer at age 52. There was a paternal grandmother diagnosed with breast cancer at age 29. The patient denies a family history of ovarian cancer.    GYNECOLOGIC HISTORY:  Patient's last menstrual period was 10/01/2006. Menarche: 64 years old Age at first live birth: 64 years old She is [place] P2.   She is status post hysterectomy with bilateral salpingo- oophorectomy 12/23/2006 with benign pathology ([id number]) She never used HRT.    SOCIAL HISTORY:  Margarine is disabled due to her breast cancer. She used to be Surveyor, quantity for a cardiology office. Her husband, [name] is in the process of retiring. He is a Electrical engineer for a power company. The patient's daughter, [name], lives in [place] and works as a Teaching laboratory technician. The patient's second daughter, [name] is a stay at home mom and is a special needs teacher, who will soon move to [facility] [place]). The patient plans on attending [employer].     ADVANCED DIRECTIVES: In the absence of any documentation to the contrary, the patient's spouse is their HCPOA.    HEALTH MAINTENANCE: Social History   Tobacco Use  . Smoking status: Never Smoker  . Smokeless tobacco: Never Used  Substance Use Topics  . Alcohol use: Not Currently  . Drug use: Never    Colonoscopy: 2017   PAP:  Bone density:  never  Mammogram: 2017   Allergies  Allergen Reactions  . Morphine And Related Anaphylaxis    Current Outpatient Medications  Medication Sig Dispense Refill  . amlodipine-atorvastatin (CADUET) 10-10 MG tablet Take 1 tablet by mouth daily.    Marland Kitchen aspirin 81 MG chewable tablet Chew by mouth daily.    . fenofibrate (TRICOR) 145 MG tablet Take 145 mg by mouth daily.    . fexofenadine (ALLEGRA ALLERGY) 60 MG tablet Take 1 tablet (60 mg total) by mouth 2 (two) times daily.    . fluticasone (FLONASE) 50 MCG/ACT nasal spray SPRAY 2 SPRAYS INTO EACH NOSTRIL EVERY DAY 48 mL 1  . gabapentin (NEURONTIN) 300 MG capsule Take 1 capsule (300 mg total) by mouth 2 (two) times daily. Pt is only taking 2 capsules a day 180 capsule 3  . IBRANCE 125 MG tablet TAKE 1 TABLET DAILY WITH BREAKFAST. TAKE WHOLE WITH FOOD FOR 21 DAYS ON AND 7 DAYS OFF EVERY 28 DAYS 63 tablet 3  . Insulin Admin Supplies MISC Inject 24 Units  into the skin daily.    . insulin glargine (LANTUS SOLOSTAR) 100 UNIT/ML Solostar Pen NJECT 24 UNITS INTO THE SKIN DAILY 15 mL 1  . Insulin Pen Needle 31G X 5 MM MISC UAD for Sq inj for DM qd 100 each PRN  . letrozole (FEMARA) 2.5 MG tablet Take 1 tablet (2.5 mg total) by mouth daily. 90 tablet 4  . losartan (COZAAR) 50 MG tablet Take 1 tablet (50 mg total) by mouth 2 (two) times daily. 180 tablet 0  . metoprolol succinate (TOPROL-XL) 25 MG 24 hr tablet Take 1 tablet (25 mg total) by mouth daily. 90 tablet 1  . oxyCODONE (OXYCONTIN) 20 mg 12 hr tablet Take 1 tablet (20 mg total) by mouth every 12 (twelve) hours. 60 tablet 0  . oxyCODONE-acetaminophen (PERCOCET) 10-325 MG tablet Take 1 tablet by mouth every 6 (six) hours as needed for pain. 140 tablet 0  . pantoprazole (PROTONIX) 40 MG tablet Take 1 tablet (40 mg total) by mouth daily. 90 tablet 3  . pioglitazone (ACTOS) 15 MG tablet Take 1 tablet (15 mg total) by mouth daily. 90 tablet 1  . pravastatin (PRAVACHOL) 40 MG tablet Take 40 mg by mouth daily.    . sitaGLIPtin-metformin (JANUMET) 50-1000 MG tablet Take 1 tablet by mouth daily with breakfast. 90 tablet 3   No current facility-administered medications for this visit.    OBJECTIVE:  white woman who appears stated age 55:   02/17/20 1208  BP: (!) 117/99  Pulse: 73  Resp: 18  Temp: 98.5 F (36.9 C)  SpO2: 95%   Wt Readings from Last 3 Encounters:  02/17/20 221 lb 4.8 oz (100.4 kg)  02/10/20 220 lb 9.6 oz (100.1 kg)  01/12/20 220 lb 8 oz (100 kg)   Body mass index is 37.99 kg/m.    ECOG FS:2 - Symptomatic, <50% confined to bed  Sclerae unicteric, EOMs intact Wearing a mask No cervical or supraclavicular adenopathy Lungs no rales or rhonchi Heart regular rate and rhythm Abd soft, nontender, positive bowel sounds MSK no focal spinal tenderness, no upper extremity lymphedema Neuro: nonfocal, well oriented, appropriate affect Breasts: Deferred   LAB RESULTS:  CMP      Component Value Date/Time   NA 138 02/17/2020 1136   NA 140 01/31/2018 0000   K 4.5 02/17/2020 1136   CL 104 02/17/2020 1136   CO2 24 02/17/2020 1136   GLUCOSE 89 02/17/2020 1136  BUN 16 02/17/2020 1136   BUN 22 (A) 01/31/2018 0000   CREATININE 0.89 02/17/2020 1136   CREATININE 1.05 (H) 03/24/2019 0833   CALCIUM 9.2 02/17/2020 1136   PROT 6.8 02/17/2020 1136   ALBUMIN 3.3 (L) 02/17/2020 1136   AST 15 02/17/2020 1136   AST 15 03/24/2019 0833   ALT 11 02/17/2020 1136   ALT 16 03/24/2019 0833   ALKPHOS 86 02/17/2020 1136   BILITOT 0.4 02/17/2020 1136   BILITOT 0.3 03/24/2019 0833   GFRNONAA >60 02/17/2020 1136   GFRNONAA 57 (L) 03/24/2019 0833   GFRAA >60 02/17/2020 1136   GFRAA >60 03/24/2019 0833    No results found for: TOTALPROTELP, ALBUMINELP, A1GS, A2GS, BETS, BETA2SER, GAMS, MSPIKE, SPEI  No results found for: KPAFRELGTCHN, LAMBDASER, KAPLAMBRATIO  Lab Results  Component Value Date   WBC 3.1 (L) 02/17/2020   NEUTROABS 2.4 02/17/2020   HGB 10.5 (L) 02/17/2020   HCT 31.9 (L) 02/17/2020   MCV 105.3 (H) 02/17/2020   PLT 319 02/17/2020    No results found for: LABCA2  No components found for: GHWEXH371  No results for input(s): INR in the last 168 hours.  No results found for: LABCA2  No results found for: IRC789  No results found for: FYB017  No results found for: PZW258  Lab Results  Component Value Date   CA2729 129.4 (H) 01/20/2020    No components found for: HGQUANT  No results found for: CEA1 / No results found for: CEA1   No results found for: AFPTUMOR  No results found for: CHROMOGRNA  No results found for: HGBA, HGBA2QUANT, HGBFQUANT, HGBSQUAN (Hemoglobinopathy evaluation)   No results found for: LDH  No results found for: IRON, TIBC, IRONPCTSAT (Iron and TIBC)  No results found for: FERRITIN  Urinalysis No results found for: COLORURINE, APPEARANCEUR, LABSPEC, PHURINE, GLUCOSEU, HGBUR, BILIRUBINUR, KETONESUR, PROTEINUR,  UROBILINOGEN, NITRITE, LEUKOCYTESUR   STUDIES: Refuses mammography at this point (12/23/2019)   ELIGIBLE FOR AVAILABLE RESEARCH PROTOCOL: no   ASSESSMENT: 64 y.o. [place], [place] woman with stage IV breast cancer, as follows:  (1) status post right mastectomy in 2000, for a pT2 pN2, stage IIIA invasive ductal carcinoma, estrogen receptor positive, progesterone receptor not tested, HER-2 negative (0) by immunohistochemistry  (a) adjuvant chemotherapy with doxorubicin and cyclophosphamide in dose dense fashion x4 followed by paclitaxel in dose dense fashion x4  (b) adjuvant radiation: 30 doses  (c) antiestrogens: Tamoxifen for 5 years, completed 2005  (2) METASTASTIC DISEASE: 2008, involving bones, lungs, and lymph nodes  (a) CA 27-29 is informative  (b) CT of the chest abdomen and pelvis in [facility] finds stable sclerotic metastases (T8, T10, T11, sternum, L4, L5); 0.6 cm left upper lobe lung nodule stable  (3)  prior anti-estrogen treatments:  (a) fulvestrant--progression  (b) exemestane/everolimus--hyperlipidemia, hyperglycemia  (4) prior chemotherapy treatments:  (a) capecitabine: progression  (b) Abraxane, August 2014 through 11/18/2015: good response but stopped due to neuropathy  (c) gemcitabine 01/20/2016--?; multiple interruptions secondary to infections  (5) radiation therapy:  (a) T spine and Right femur, completed 01/10/2016  (6) letrozole/ palbociclib started 07/13/2016  (7) bone treatment:  (a) denosumab/Xgeva--discontinued January 2016 due to osteonecrosis of the jaw  (8) cancer associated pain:  (a) oxycodone/APAP 10/325 QID as needed  (b) PMP Aware reviewed 12/23/2019  (c) OxyContin 20 mg p.o. twice daily added 09/29/2019  (d) bowel prophylaxis: MiraLAX as needed  (9) restaging studies:  (a) mostly stable bone lesions with evidence of progression at L3-L5; no  epidural tumor by total spinal MRI in November 2019  (b) no evidence of progressive  lung, lymph node or liver lesions by CT scans of the chest abdomen and pelvis and bone scan December 2019  (c) CT scans abd/pelvis 03/31/2019 shows stable bone metastases, no extraosseous disease  (d) MRI pelvis 04/09/2019 shows bilateral ilium, left acetabulum and lumbar mets, stable  (e) ct abd/pelvis 09/01/2019 shows stable bone mets, slight increase left effusion  (10) palliative radiation 11/18/2018 through 12/02/2018 Site/dose:   1. Lumbar spine; 35 Gy in 14 fractions of 2.5 Gy                      2. Pelvis; 35 Gy in 14 fractions of 2.5 Gy   PLAN: Moria is now in her 13th year from initial diagnosis of metastatic breast cancer.  Clinically she is very stable and since we started the long-acting pain medication her functional status is clearly improved.  Her tumor marker has been stable.  We have 1 pending today.  Assuming no surprises the plan is for her to return in 3 months for a visit and before that visit she will have a CT scan of the chest.  We will continue to obtain monthly labs to make sure she does not become significantly neutropenic from her palbociclib  I encouraged her to remain as active as possible.  She knows to call for any other issue that may develop before the next visit.  Total encounter time 30 minutes.*   Bruce Mayers, Virgie Dad, MD  02/17/20 12:49 PM Medical Oncology and Hematology [facility] [place], [place] [address] Tel. [phone]    Fax. [phone]   I, Wilburn Mylar, am acting as scribe for Dr. Virgie Dad. Jeanclaude Wentworth.  I, Lurline Del MD, have reviewed the above documentation for accuracy and completeness, and I agree with the above.   *Total Encounter Time as defined by the Centers for Medicare and Medicaid Services includes, in addition to the face-to-face time of a patient visit (documented in the note above) non-face-to-face time: obtaining and reviewing outside history, ordering and reviewing medications,  tests or procedures, care coordination (communications with other health care professionals or caregivers) and documentation in the medical record.

## 2020-02-18 LAB — CANCER ANTIGEN 27.29: CA 27.29: 154.8 U/mL — ABNORMAL HIGH (ref 0.0–38.6)

## 2020-02-19 ENCOUNTER — Telehealth: Payer: Self-pay | Admitting: Oncology

## 2020-02-19 NOTE — Telephone Encounter (Signed)
Scheduled appts per 5/19 los. Pt confirmed appt dates and times.

## 2020-02-27 ENCOUNTER — Encounter: Payer: Self-pay | Admitting: Oncology

## 2020-03-01 ENCOUNTER — Other Ambulatory Visit: Payer: Self-pay | Admitting: *Deleted

## 2020-03-02 ENCOUNTER — Other Ambulatory Visit: Payer: Self-pay | Admitting: Oncology

## 2020-03-02 ENCOUNTER — Encounter: Payer: Self-pay | Admitting: Family Medicine

## 2020-03-02 DIAGNOSIS — E785 Hyperlipidemia, unspecified: Secondary | ICD-10-CM

## 2020-03-02 MED ORDER — OXYCODONE HCL ER 20 MG PO T12A: 20.0000 mg | EXTENDED_RELEASE_TABLET | Freq: Two times a day (BID) | ORAL | 0 refills | Status: DC

## 2020-03-03 MED ORDER — FENOFIBRATE 145 MG PO TABS: 145.0000 mg | ORAL_TABLET | Freq: Every day | ORAL | 3 refills | Status: DC

## 2020-03-03 MED ORDER — PRAVASTATIN SODIUM 20 MG PO TABS: 20.0000 mg | ORAL_TABLET | Freq: Every day | ORAL | 3 refills | Status: DC

## 2020-03-16 ENCOUNTER — Inpatient Hospital Stay: Payer: MEDICARE | Attending: Oncology

## 2020-03-16 ENCOUNTER — Other Ambulatory Visit: Payer: Self-pay

## 2020-03-16 DIAGNOSIS — C50919 Malignant neoplasm of unspecified site of unspecified female breast: Secondary | ICD-10-CM

## 2020-03-16 DIAGNOSIS — C50911 Malignant neoplasm of unspecified site of right female breast: Secondary | ICD-10-CM | POA: Insufficient documentation

## 2020-03-16 DIAGNOSIS — C7800 Secondary malignant neoplasm of unspecified lung: Secondary | ICD-10-CM

## 2020-03-16 DIAGNOSIS — C7951 Secondary malignant neoplasm of bone: Secondary | ICD-10-CM

## 2020-03-16 DIAGNOSIS — Z170 Estrogen receptor positive status [ER+]: Secondary | ICD-10-CM | POA: Diagnosis not present

## 2020-03-16 DIAGNOSIS — M879 Osteonecrosis, unspecified: Secondary | ICD-10-CM

## 2020-03-16 LAB — COMPREHENSIVE METABOLIC PANEL
ALT: 11 U/L (ref 0–44)
AST: 16 U/L (ref 15–41)
Albumin: 3.3 g/dL — ABNORMAL LOW (ref 3.5–5.0)
Alkaline Phosphatase: 78 U/L (ref 38–126)
Anion gap: 9 (ref 5–15)
BUN: 16 mg/dL (ref 8–23)
CO2: 26 mmol/L (ref 22–32)
Calcium: 9.8 mg/dL (ref 8.9–10.3)
Chloride: 104 mmol/L (ref 98–111)
Creatinine, Ser: 0.96 mg/dL (ref 0.44–1.00)
GFR calc Af Amer: >60 mL/min (ref >60)
GFR calc non Af Amer: >60 mL/min (ref >60)
Glucose, Bld: 100 mg/dL — ABNORMAL HIGH (ref 70–99)
Potassium: 5.3 mmol/L — ABNORMAL HIGH (ref 3.5–5.1)
Sodium: 139 mmol/L (ref 135–145)
Total Bilirubin: 0.5 mg/dL (ref 0.3–1.2)
Total Protein: 6.9 g/dL (ref 6.5–8.1)

## 2020-03-16 LAB — CBC WITH DIFFERENTIAL/PLATELET
Abs Immature Granulocytes: 0.02 10*3/uL (ref 0.00–0.07)
Basophils Absolute: 0.1 10*3/uL (ref 0.0–0.1)
Basophils Relative: 2 %
Eosinophils Absolute: 0 10*3/uL (ref 0.0–0.5)
Eosinophils Relative: 1 %
HCT: 32.1 % — ABNORMAL LOW (ref 36.0–46.0)
Hemoglobin: 10.4 g/dL — ABNORMAL LOW (ref 12.0–15.0)
Immature Granulocytes: 1 %
Lymphocytes Relative: 16 %
Lymphs Abs: 0.5 10*3/uL — ABNORMAL LOW (ref 0.7–4.0)
MCH: 35.6 pg — ABNORMAL HIGH (ref 26.0–34.0)
MCHC: 32.4 g/dL (ref 30.0–36.0)
MCV: 109.9 fL — ABNORMAL HIGH (ref 80.0–100.0)
Monocytes Absolute: 0.2 10*3/uL (ref 0.1–1.0)
Monocytes Relative: 7 %
Neutro Abs: 2.1 10*3/uL (ref 1.7–7.7)
Neutrophils Relative %: 73 %
Platelets: 415 10*3/uL — ABNORMAL HIGH (ref 150–400)
RBC: 2.92 MIL/uL — ABNORMAL LOW (ref 3.87–5.11)
RDW: 15 % (ref 11.5–15.5)
WBC: 2.9 10*3/uL — ABNORMAL LOW (ref 4.0–10.5)
nRBC: 0 % (ref 0.0–0.2)

## 2020-03-17 LAB — CANCER ANTIGEN 27.29: CA 27.29: 185.3 U/mL — ABNORMAL HIGH (ref 0.0–38.6)

## 2020-03-28 ENCOUNTER — Other Ambulatory Visit: Payer: Self-pay

## 2020-03-28 ENCOUNTER — Other Ambulatory Visit: Payer: Self-pay | Admitting: Hematology and Oncology

## 2020-03-28 ENCOUNTER — Encounter: Payer: Self-pay | Admitting: Oncology

## 2020-03-28 DIAGNOSIS — Z760 Encounter for issue of repeat prescription: Secondary | ICD-10-CM

## 2020-03-28 MED ORDER — OXYCODONE HCL ER 20 MG PO T12A: 20.0000 mg | EXTENDED_RELEASE_TABLET | Freq: Two times a day (BID) | ORAL | 0 refills | Status: DC

## 2020-03-28 MED ORDER — FLUTICASONE PROPIONATE 50 MCG/ACT NA SUSP: NASAL | 1 refills | Status: DC

## 2020-03-28 NOTE — Telephone Encounter (Signed)
Last OV 02/10/20 Last fill 09/23/19  69ml/1

## 2020-04-10 ENCOUNTER — Other Ambulatory Visit: Payer: Self-pay | Admitting: Family Medicine

## 2020-04-13 ENCOUNTER — Other Ambulatory Visit: Payer: Self-pay

## 2020-04-13 ENCOUNTER — Inpatient Hospital Stay: Payer: MEDICARE | Attending: Oncology

## 2020-04-13 DIAGNOSIS — C7951 Secondary malignant neoplasm of bone: Secondary | ICD-10-CM

## 2020-04-13 DIAGNOSIS — C50919 Malignant neoplasm of unspecified site of unspecified female breast: Secondary | ICD-10-CM

## 2020-04-13 DIAGNOSIS — C50911 Malignant neoplasm of unspecified site of right female breast: Secondary | ICD-10-CM | POA: Diagnosis not present

## 2020-04-13 DIAGNOSIS — M879 Osteonecrosis, unspecified: Secondary | ICD-10-CM

## 2020-04-13 DIAGNOSIS — Z170 Estrogen receptor positive status [ER+]: Secondary | ICD-10-CM | POA: Diagnosis not present

## 2020-04-13 DIAGNOSIS — C7800 Secondary malignant neoplasm of unspecified lung: Secondary | ICD-10-CM

## 2020-04-13 LAB — CBC WITH DIFFERENTIAL/PLATELET
Abs Immature Granulocytes: 0.01 10*3/uL (ref 0.00–0.07)
Basophils Absolute: 0.1 10*3/uL (ref 0.0–0.1)
Basophils Relative: 2 %
Eosinophils Absolute: 0 10*3/uL (ref 0.0–0.5)
Eosinophils Relative: 1 %
HCT: 31.2 % — ABNORMAL LOW (ref 36.0–46.0)
Hemoglobin: 10 g/dL — ABNORMAL LOW (ref 12.0–15.0)
Immature Granulocytes: 0 %
Lymphocytes Relative: 15 %
Lymphs Abs: 0.4 10*3/uL — ABNORMAL LOW (ref 0.7–4.0)
MCH: 35.7 pg — ABNORMAL HIGH (ref 26.0–34.0)
MCHC: 32.1 g/dL (ref 30.0–36.0)
MCV: 111.4 fL — ABNORMAL HIGH (ref 80.0–100.0)
Monocytes Absolute: 0.2 10*3/uL (ref 0.1–1.0)
Monocytes Relative: 8 %
Neutro Abs: 2.1 10*3/uL (ref 1.7–7.7)
Neutrophils Relative %: 74 %
Platelets: 420 10*3/uL — ABNORMAL HIGH (ref 150–400)
RBC: 2.8 MIL/uL — ABNORMAL LOW (ref 3.87–5.11)
RDW: 15.5 % (ref 11.5–15.5)
WBC: 2.9 10*3/uL — ABNORMAL LOW (ref 4.0–10.5)
nRBC: 0 % (ref 0.0–0.2)

## 2020-04-13 LAB — COMPREHENSIVE METABOLIC PANEL
ALT: 11 U/L (ref 0–44)
AST: 20 U/L (ref 15–41)
Albumin: 3.5 g/dL (ref 3.5–5.0)
Alkaline Phosphatase: 67 U/L (ref 38–126)
Anion gap: 9 (ref 5–15)
BUN: 18 mg/dL (ref 8–23)
CO2: 27 mmol/L (ref 22–32)
Calcium: 9.8 mg/dL (ref 8.9–10.3)
Chloride: 106 mmol/L (ref 98–111)
Creatinine, Ser: 1.25 mg/dL — ABNORMAL HIGH (ref 0.44–1.00)
GFR calc Af Amer: 53 mL/min — ABNORMAL LOW (ref >60)
GFR calc non Af Amer: 46 mL/min — ABNORMAL LOW (ref >60)
Glucose, Bld: 98 mg/dL (ref 70–99)
Potassium: 4.9 mmol/L (ref 3.5–5.1)
Sodium: 142 mmol/L (ref 135–145)
Total Bilirubin: 0.4 mg/dL (ref 0.3–1.2)
Total Protein: 7.1 g/dL (ref 6.5–8.1)

## 2020-04-13 MED ORDER — PIOGLITAZONE HCL 15 MG PO TABS: 15.0000 mg | ORAL_TABLET | Freq: Every day | ORAL | 1 refills | Status: DC

## 2020-04-13 NOTE — Telephone Encounter (Signed)
Last OV 02/10/20 Last fill 10/06/19  #90/1

## 2020-04-14 LAB — CANCER ANTIGEN 27.29: CA 27.29: 165.9 U/mL — ABNORMAL HIGH (ref 0.0–38.6)

## 2020-04-22 ENCOUNTER — Other Ambulatory Visit: Payer: Self-pay

## 2020-04-22 MED ORDER — METOPROLOL SUCCINATE ER 25 MG PO TB24: 25.0000 mg | ORAL_TABLET | Freq: Every day | ORAL | 1 refills | Status: DC

## 2020-04-22 NOTE — Telephone Encounter (Signed)
Last OV 02/10/2020 Last fill 10/30/19  #90/1

## 2020-04-28 ENCOUNTER — Encounter: Payer: Self-pay | Admitting: Oncology

## 2020-04-28 ENCOUNTER — Other Ambulatory Visit: Payer: Self-pay | Admitting: Oncology

## 2020-04-28 MED ORDER — OXYCODONE HCL ER 20 MG PO T12A: 20.0000 mg | EXTENDED_RELEASE_TABLET | Freq: Two times a day (BID) | ORAL | 0 refills | Status: DC

## 2020-05-05 ENCOUNTER — Encounter (HOSPITAL_COMMUNITY): Payer: Self-pay

## 2020-05-05 ENCOUNTER — Other Ambulatory Visit: Payer: Self-pay

## 2020-05-05 ENCOUNTER — Ambulatory Visit (HOSPITAL_COMMUNITY)
Admission: RE | Admit: 2020-05-05 | Discharge: 2020-05-05 | Disposition: A | Discharge status: Home / Self Care | Payer: MEDICARE | Source: Ambulatory Visit | Attending: Oncology | Admitting: Oncology

## 2020-05-05 DIAGNOSIS — C50919 Malignant neoplasm of unspecified site of unspecified female breast: Secondary | ICD-10-CM | POA: Insufficient documentation

## 2020-05-05 DIAGNOSIS — C7800 Secondary malignant neoplasm of unspecified lung: Secondary | ICD-10-CM

## 2020-05-05 DIAGNOSIS — Z5111 Encounter for antineoplastic chemotherapy: Secondary | ICD-10-CM | POA: Diagnosis not present

## 2020-05-05 DIAGNOSIS — C7951 Secondary malignant neoplasm of bone: Secondary | ICD-10-CM | POA: Diagnosis not present

## 2020-05-05 DIAGNOSIS — I2510 Atherosclerotic heart disease of native coronary artery without angina pectoris: Secondary | ICD-10-CM | POA: Diagnosis not present

## 2020-05-05 DIAGNOSIS — J181 Lobar pneumonia, unspecified organism: Secondary | ICD-10-CM | POA: Diagnosis not present

## 2020-05-05 MED ORDER — IOHEXOL 300 MG/ML  SOLN
75.0000 mL | Freq: Once | INTRAMUSCULAR | Status: AC | PRN
  Administered 2020-05-05: 75 mL via INTRAVENOUS

## 2020-05-05 MED ORDER — SODIUM CHLORIDE (PF) 0.9 % IJ SOLN
INTRAMUSCULAR | Status: AC
  Filled 2020-05-05: qty 50

## 2020-05-10 NOTE — Progress Notes (Signed)
[facility]  [phone] Fax:(336) [phone]    ID: Mia [name] DOB: December 11, 1955  MR#: [id number]  HQP#:[id number]  Patient Care Team: Mia Nian, DO as PCP - General (Family Medicine) Mia [name], Mia Dad, MD as Consulting Physician (Oncology) OTHER MD: Mia Austria MD, Mia Sabal PA   CHIEF COMPLAINT: Estrogen receptor positive stage IV breast cancer (s/p right mastectomy)  CURRENT TREATMENT: Letrozole, palbociclib   INTERVAL HISTORY: Mia [name] returns today for follow-up of her etrogen receptor positive stage IV breast cancer.   Since her last visit, she underwent chest CT on 05/05/2020 showing: slight interval increase in a small to moderate left pleural effusion with associated atelectasis or consolidation, of uncertain significance; otherwise, unchanged post-treatment appearance; stable 5 mm pulmonary nodule of peripheral left upper lobe, presumed incidental and benign; increasingly lytic, permeative appearance of a sclerotic osseous lesion of the left glenoid, of uncertain significance, possibly resorption following treatment; no significant interval change in multiple additional sclerotic osseous lesions.  She continues on letrozole.  Hot flashes are not a major issue for her and she has no vaginal dryness problems.  She also continues on palbociclib.  She is tolerating this with no unusual side effects that she is aware of.  She thinks she may have a little bit of nausea at times but that is about it as far as the palbociclib is concerned.  She does have baseline fatigue and it is not clear whether this makes it worse or not.  There is no bone density screening on file.  She is not receiving denosumab/Xgeva or zoledronate because of her history of osteonecrosis of the jaw.    We are following her CA 27-29. Lab Results  Component Value Date   CA2729 165.9 (H) 04/13/2020   CA2729 185.3 (H) 03/16/2020   CA2729 154.8 (H) 02/17/2020   CA2729  129.4 (H) 01/20/2020   CA2729 129.2 (H) 12/23/2019    REVIEW OF SYSTEMS: Mia [name]'s pain is much better controlled on OxyContin.  She takes Percocet perhaps once a day occasionally twice a day.  However she has been having worsening right hip and right groin pain.  This is really keeping her from walking.  She does not hurt at all when she is sitting.  She hurts sometimes when she is lying flat in bed but if she puts a pillow under her right knee then it does not hurt.  This is keeping her from moving and it is very distressing.  She is already on Neurontin as well as narcotics.  She had radiation to the left side, and the pain in that side is currently well controlled.  Incidentally recall that she had the [place] vaccine with no complications.  Detailed review of systems today was otherwise stable  HISTORY OF CURRENT ILLNESS: From the original intake note:  We have reviewed the medical records from [place], which is the source of the information below:  Mia [name] was initially diagnosed with Stage IIIA (T2N2M0) invasive ductal carcinoma, estrogen receptor positive and HER2 negative right breast cancer in 2000. She underwent right mastectomy, with 4 positive metastatic lymph nodes. She had chemotherapy with taxol and cytoxan and fulvestrant in the past. She had radiation and completed 5 years of tamoxifen.   She was diagnosed with Stage IV disease 04/18/2005 with metastases to the bone, lung, and additional nodes. She was on capecitabine but was discontinued after rising CA 27-29 and scans.   She started anastrozole and everolimus with Delton See in April 2014. She  tolerated this well, but this was discontinued in July 2014 due to abnormal blood tests, hyperlipidemia and hyperglycemia.  She began Abraxane 133m /m2 and Xgeva in August 2014 weekly x3 with 1 week off. She tolerated this treatment well. She discontinued Xgeva January 2016 due to osteonecrosis of the jaw and mouth issues.  She continued abraxane alone until her last dose on 11/18/2015 due to neuropathy and disease progression with restaging studies on 11/23/2015 with a CT chest abdomen and pelvis and one scan showing skull, sternal and femoral lesions.   She was switched to gemcitabine starting 01/20/2016 weekly x3 and 1 week off. This was discontinued on 06/15/2016 due to a port related jugular clot on the right side. She was given Lovenox for 3 months.  She was switched to Letrozole 2.5 mg and palbociclib 125 mg starting 07/13/2016. She tolerated this well. Restaging studies with CT chest abdomen and pelvis and bone scan on 07/01/2017 showed stable/ improving lesions. CA 27-29 was stable.  During the last few months of follow up in [place] she completed restaging studies [facility] showing: A 6 mm pulmonary nodule in the left upper lobe laterally. Progression of metastatic disease to the bones at the L4 and L5 vertebral bodies. Sclerotic metastases at T8, T10, an T11, are similar to previous exams. Sclerotic metastases in the sternum stable. Hepatic steatosis. Aortic atherosclerosis.  Most recent CCA-64-29(January 2019) was 58.  Most recent hemoglobin A1c was 7.1 according to the patient  The patient's subsequent history is as detailed below.   PAST MEDICAL HISTORY: Past Medical History:  Diagnosis Date   Breast cancer metastasized to bone ([facility]    Hyperlipidemia    Hypertension    Lymphedema of right arm    Neuropathy associated with cancer (HCC)    Obesity (BMI 35.0-39.9 without comorbidity)      PAST SURGICAL HISTORY: Past Surgical History:  Procedure Laterality Date   CATARACT EXTRACTION Left    HYSTERECTOMY ABDOMINAL WITH SALPINGO-OOPHORECTOMY     MODIFIED RADICAL MASTECTOMY Right      FAMILY HISTORY: Family History  Problem Relation Age of Onset   Diabetes Mother    Breast cancer Mother [phone]  Cerebral aneurysm Mother     Pulmonary embolism Father    Colon cancer Father [phone]  Breast cancer Sister [phone]      noninvasive   Breast cancer Paternal GGrandmother 38   [facility]. --The patient's father died at age 17361due to a PE, and he also had a history of colon cancer diagnosed at age 64 The patient's mother died at age 21266due to diabetes and survived a cerebral aneurysm. The patient's mother also had a history of breast cancer diagnosed at age 43563  The patient has no brothers and 1 sister. The patient's sister was diagnosed with non invasive breast cancer at age 64 There was a paternal grandmother diagnosed with breast cancer at age 17333 The patient denies a family history of ovarian cancer.    GYNECOLOGIC HISTORY:  Patient's last menstrual period was 10/01/2006. Menarche: 64years old Age at first live birth: 64years old She is [employer].  She is status post hysterectomy with bilateral salpingo- oophorectomy 12/23/2006 with benign pathology (([id number] She never used HRT.    SOCIAL HISTORY:  KJyotiis disabled due to her breast cancer. She used  to be Surveyor, quantity for a cardiology office. Her husband, [name] is in the process of retiring. He is a Electrical engineer for a power company. The patient's daughter, [name], lives in [place] and works as a Teaching laboratory technician. The patient's second daughter, [name] is a stay at home mom and is a special needs teacher, who will soon move to [facility] [place]). The patient plans on attending [employer].     ADVANCED DIRECTIVES: In the absence of any documentation to the contrary, the patient's spouse is their HCPOA.    HEALTH MAINTENANCE: Social History   Tobacco Use   Smoking status: Never Smoker   Smokeless tobacco: Never Used  Vaping Use   Vaping Use: Never used  Substance Use Topics   Alcohol use: Not Currently   Drug use:  Never    Colonoscopy: 2017   PAP:  Bone density: never  Mammogram: 2017   Allergies  Allergen Reactions   Morphine And Related Anaphylaxis    Current Outpatient Medications  Medication Sig Dispense Refill   amlodipine-atorvastatin (CADUET) 10-10 MG tablet Take 1 tablet by mouth daily.     aspirin 81 MG chewable tablet Chew by mouth daily.     fenofibrate (TRICOR) 145 MG tablet Take 1 tablet (145 mg total) by mouth daily. 90 tablet 3   fexofenadine (ALLEGRA ALLERGY) 60 MG tablet Take 1 tablet (60 mg total) by mouth 2 (two) times daily.     fluticasone (FLONASE) 50 MCG/ACT nasal spray SPRAY 2 SPRAYS INTO EACH NOSTRIL EVERY DAY 48 mL 1   gabapentin (NEURONTIN) 300 MG capsule Take 1 capsule (300 mg total) by mouth 2 (two) times daily. Pt is only taking 2 capsules a day 180 capsule 3   IBRANCE 125 MG tablet TAKE 1 TABLET DAILY WITH BREAKFAST. TAKE WHOLE WITH FOOD FOR 21 DAYS ON AND 7 DAYS OFF EVERY 28 DAYS 63 tablet 3   Insulin Admin Supplies MISC Inject 24 Units into the skin daily.     Insulin Pen Needle 31G X 5 MM MISC UAD for Sq inj for DM qd 100 each PRN   LANTUS SOLOSTAR 100 UNIT/ML Solostar Pen NJECT 24 UNITS INTO THE SKIN DAILY 15 mL 1   letrozole (FEMARA) 2.5 MG tablet Take 1 tablet (2.5 mg total) by mouth daily. 90 tablet 4   losartan (COZAAR) 50 MG tablet Take 1 tablet (50 mg total) by mouth 2 (two) times daily. 180 tablet 0   metoprolol succinate (TOPROL-XL) 25 MG 24 hr tablet Take 1 tablet (25 mg total) by mouth daily. 90 tablet 1   oxyCODONE (OXYCONTIN) 20 mg 12 hr tablet Take 1 tablet (20 mg total) by mouth every 12 (twelve) hours. 60 tablet 0   oxyCODONE-acetaminophen (PERCOCET) 10-325 MG tablet Take 1 tablet by mouth every 6 (six) hours as needed for pain. 140 tablet 0   pantoprazole (PROTONIX) 40 MG tablet Take 1 tablet (40 mg total) by mouth daily. 90 tablet 3   pioglitazone (ACTOS) 15 MG tablet Take 1 tablet (15 mg total) by mouth daily. 90 tablet 1    pravastatin (PRAVACHOL) 20 MG tablet Take 1 tablet (20 mg total) by mouth daily. 90 tablet 3   sitaGLIPtin-metformin (JANUMET) 50-1000 MG tablet Take 1 tablet by mouth daily with breakfast. 90 tablet 3   No current facility-administered medications for this visit.    OBJECTIVE:  white woman who appears stated age 33:   05/11/20 1137  BP: 131/74  Pulse: 74  Resp: 18  Temp: (!) 97.3 F (36.3 C)  SpO2: 97%   Wt Readings from Last 3 Encounters:  05/11/20 213 lb 6.4 oz (96.8 kg)  02/17/20 221 lb 4.8 oz (100.4 kg)  02/10/20 220 lb 9.6 oz (100.1 kg)   Body mass index is 36.63 kg/m.    ECOG FS:2 - Symptomatic, <50% confined to bed  Sclerae unicteric, EOMs intact Wearing a mask No cervical or supraclavicular adenopathy Lungs no rales or rhonchi Heart regular rate and rhythm Abd soft, nontender, positive bowel sounds MSK no focal spinal tenderness, chronic right upper extremity lymphedema, grade 2 Neuro: nonfocal, well oriented, appropriate affect Breasts: Deferred      LAB RESULTS:  CMP     Component Value Date/Time   NA 139 05/11/2020 1119   NA 140 01/31/2018 0000   K 4.4 05/11/2020 1119   CL 106 05/11/2020 1119   CO2 23 05/11/2020 1119   GLUCOSE 111 (H) 05/11/2020 1119   BUN 20 05/11/2020 1119   BUN 22 (A) 01/31/2018 0000   CREATININE 1.11 (H) 05/11/2020 1119   CREATININE 1.05 (H) 03/24/2019 0833   CALCIUM 10.0 05/11/2020 1119   PROT 7.1 05/11/2020 1119   ALBUMIN 3.4 (L) 05/11/2020 1119   AST 17 05/11/2020 1119   AST 15 03/24/2019 0833   ALT 7 05/11/2020 1119   ALT 16 03/24/2019 0833   ALKPHOS 75 05/11/2020 1119   BILITOT 0.4 05/11/2020 1119   BILITOT 0.3 03/24/2019 0833   GFRNONAA 53 (L) 05/11/2020 1119   GFRNONAA 57 (L) 03/24/2019 0833   GFRAA >60 05/11/2020 1119   GFRAA >60 03/24/2019 0833    No results found for: TOTALPROTELP, ALBUMINELP, A1GS, A2GS, BETS, BETA2SER, GAMS, MSPIKE, SPEI  No results found for: KPAFRELGTCHN, LAMBDASER,  KAPLAMBRATIO  Lab Results  Component Value Date   WBC 3.0 (L) 05/11/2020   NEUTROABS 2.4 05/11/2020   HGB 10.4 (L) 05/11/2020   HCT 32.2 (L) 05/11/2020   MCV 109.5 (H) 05/11/2020   PLT 418 (H) 05/11/2020    No results found for: LABCA2  No components found for: [id number]  No results for input(s): INR in the last 168 hours.  No results found for: LABCA2  No results found for: HYQ657  No results found for: QIO962  No results found for: XBM841  Lab Results  Component Value Date   CA2729 165.9 (H) 04/13/2020    No components found for: HGQUANT  No results found for: CEA1 / No results found for: CEA1   No results found for: AFPTUMOR  No results found for: CHROMOGRNA  No results found for: HGBA, HGBA2QUANT, HGBFQUANT, HGBSQUAN (Hemoglobinopathy evaluation)   No results found for: LDH  No results found for: IRON, TIBC, IRONPCTSAT (Iron and TIBC)  No results found for: FERRITIN  Urinalysis No results found for: COLORURINE, APPEARANCEUR, LABSPEC, PHURINE, GLUCOSEU, HGBUR, BILIRUBINUR, KETONESUR, PROTEINUR, UROBILINOGEN, NITRITE, LEUKOCYTESUR   STUDIES: Refuses mammography at this point (12/23/2019)   ELIGIBLE FOR AVAILABLE RESEARCH PROTOCOL: no   ASSESSMENT: 64 y.o. [place], [place] woman with stage IV breast cancer, as follows:  (1) status post right mastectomy in 2000, for a pT2 pN2, stage IIIA invasive ductal carcinoma, estrogen receptor positive, progesterone receptor not tested, HER-2 negative (0) by immunohistochemistry  (a) adjuvant chemotherapy with doxorubicin and cyclophosphamide in dose dense fashion x4 followed by paclitaxel in dose dense fashion x4  (b) adjuvant radiation: 30 doses  (c) antiestrogens: Tamoxifen for 5 years, completed 2005  (2) METASTASTIC DISEASE: 2008, involving bones, lungs, and  lymph nodes  (a) CA 27-29 is informative  (b) CT of the chest abdomen and pelvis in [facility] finds stable sclerotic metastases (T8, T10,  T11, sternum, L4, L5); 0.6 cm left upper lobe lung nodule stable  (3)  prior anti-estrogen treatments:  (a) fulvestrant--progression  (b) exemestane/everolimus--hyperlipidemia, hyperglycemia  (4) prior chemotherapy treatments:  (a) capecitabine: progression  (b) Abraxane, August 2014 through 11/18/2015: good response but stopped due to neuropathy  (c) gemcitabine 01/20/2016--?; multiple interruptions secondary to infections  (5) radiation therapy:  (a) T spine and Right femur, completed 01/10/2016  (6) letrozole/ palbociclib started 07/13/2016  (7) bone treatment:  (a) denosumab/Xgeva--discontinued January 2016 due to osteonecrosis of the jaw  (8) cancer associated pain:  (a) oxycodone/APAP 10/325 QID as needed  (b) PMP Aware reviewed 05/11/2020  (c) OxyContin 20 mg p.o. twice daily added 09/29/2019  (d) bowel prophylaxis: MiraLAX as needed  (9) restaging studies:  (a) mostly stable bone lesions with evidence of progression at L3-L5; no epidural tumor by total spinal MRI in November 2019  (b) no evidence of progressive lung, lymph node or liver lesions by CT scans of the chest abdomen and pelvis and bone scan December 2019  (c) CT scans abd/pelvis 03/31/2019 shows stable bone metastases, no extraosseous disease  (d) MRI pelvis 04/09/2019 shows bilateral ilium, left acetabulum and lumbar mets, stable  (e) CT abd/pelvis 09/01/2019 shows stable bone mets, slight increase left effusion  (f) CT of the chest again shows minimal increase in the left pleural effusion, otherwise stable  (10) palliative radiation 11/18/2018 through 12/02/2018 Site/dose:   1. Lumbar spine; 35 Gy in 14 fractions of 2.5 Gy                      2. Pelvis; 35 Gy in 14 fractions of 2.5 Gy   PLAN: Ceniyah is now 13 years out from initial diagnosis of metastatic breast cancer.  Her disease is fairly well controlled on her current treatment and we reviewed the CT scan of the chest which aside from the minimal  increase in the left effusion is stable.  However she does have more pain in the right hip area.  Not sure exactly what this is due to.  I am going to obtain right hip and pelvis films.  She may benefit from radiation to that area, possibly kyphoplasty, possibly other interventions.  I also suggested she try Aleve.  Sometimes nonsteroidals work better for certain types of pain than narcotics.  She has an excellent bowel prophylaxis regimen and manages to have regular soft bowel movements despite the narcotics.  Overall than she will continue to have lab work here on a monthly basis.  I am not making any change in her pain medicines or bowel prophylaxis.  She will see [place] again in 2 months.  Once we get the MRI results we will be able to direct her as to how best to try to better control or eliminate if possible the right hip pain.  Total encounter time 35 minutes. Ibraham Levi, Mia Dad, MD  05/11/20 12:23 PM Medical Oncology and Hematology [facility] [place], [place] [address] Tel. [phone]    Fax. [phone]   I, Wilburn Mylar, am acting as scribe for Dr. Virgie [name]. Loron Weimer.  I, Lurline Del MD, have reviewed the above documentation for accuracy and completeness, and I agree with the above.   *Total Encounter Time as defined by the Centers for Medicare and  Medicaid Services includes, in addition to the face-to-face time of a patient visit (documented in the note above) non-face-to-face time: obtaining and reviewing outside history, ordering and reviewing medications, tests or procedures, care coordination (communications with other health care professionals or caregivers) and documentation in the medical record.

## 2020-05-11 ENCOUNTER — Other Ambulatory Visit: Payer: Self-pay

## 2020-05-11 ENCOUNTER — Inpatient Hospital Stay: Payer: MEDICARE | Attending: Oncology

## 2020-05-11 ENCOUNTER — Telehealth: Payer: Self-pay | Admitting: Oncology

## 2020-05-11 ENCOUNTER — Inpatient Hospital Stay (HOSPITAL_BASED_OUTPATIENT_CLINIC_OR_DEPARTMENT_OTHER): Payer: MEDICARE | Admitting: Oncology

## 2020-05-11 VITALS — BP 131/74 | HR 74 | Temp 97.3°F | Resp 18 | Ht 64.0 in | Wt 213.4 lb

## 2020-05-11 DIAGNOSIS — C7800 Secondary malignant neoplasm of unspecified lung: Secondary | ICD-10-CM

## 2020-05-11 DIAGNOSIS — Z7189 Other specified counseling: Secondary | ICD-10-CM | POA: Diagnosis not present

## 2020-05-11 DIAGNOSIS — Z79810 Long term (current) use of selective estrogen receptor modulators (SERMs): Secondary | ICD-10-CM | POA: Diagnosis not present

## 2020-05-11 DIAGNOSIS — C7951 Secondary malignant neoplasm of bone: Secondary | ICD-10-CM

## 2020-05-11 DIAGNOSIS — M25551 Pain in right hip: Secondary | ICD-10-CM | POA: Insufficient documentation

## 2020-05-11 DIAGNOSIS — C779 Secondary and unspecified malignant neoplasm of lymph node, unspecified: Secondary | ICD-10-CM | POA: Insufficient documentation

## 2020-05-11 DIAGNOSIS — R102 Pelvic and perineal pain unspecified side: Secondary | ICD-10-CM

## 2020-05-11 DIAGNOSIS — Z9221 Personal history of antineoplastic chemotherapy: Secondary | ICD-10-CM | POA: Diagnosis not present

## 2020-05-11 DIAGNOSIS — Z170 Estrogen receptor positive status [ER+]: Secondary | ICD-10-CM | POA: Diagnosis not present

## 2020-05-11 DIAGNOSIS — Z923 Personal history of irradiation: Secondary | ICD-10-CM | POA: Diagnosis not present

## 2020-05-11 DIAGNOSIS — Z79811 Long term (current) use of aromatase inhibitors: Secondary | ICD-10-CM | POA: Insufficient documentation

## 2020-05-11 DIAGNOSIS — C50919 Malignant neoplasm of unspecified site of unspecified female breast: Secondary | ICD-10-CM

## 2020-05-11 DIAGNOSIS — M879 Osteonecrosis, unspecified: Secondary | ICD-10-CM

## 2020-05-11 DIAGNOSIS — G620 Drug-induced polyneuropathy: Secondary | ICD-10-CM | POA: Diagnosis not present

## 2020-05-11 DIAGNOSIS — T451X5A Adverse effect of antineoplastic and immunosuppressive drugs, initial encounter: Secondary | ICD-10-CM | POA: Diagnosis not present

## 2020-05-11 DIAGNOSIS — C50911 Malignant neoplasm of unspecified site of right female breast: Secondary | ICD-10-CM | POA: Diagnosis not present

## 2020-05-11 DIAGNOSIS — G893 Neoplasm related pain (acute) (chronic): Secondary | ICD-10-CM | POA: Insufficient documentation

## 2020-05-11 LAB — CBC WITH DIFFERENTIAL/PLATELET
Abs Immature Granulocytes: 0.01 10*3/uL (ref 0.00–0.07)
Basophils Absolute: 0.1 10*3/uL (ref 0.0–0.1)
Basophils Relative: 3 %
Eosinophils Absolute: 0 10*3/uL (ref 0.0–0.5)
Eosinophils Relative: 1 %
HCT: 32.2 % — ABNORMAL LOW (ref 36.0–46.0)
Hemoglobin: 10.4 g/dL — ABNORMAL LOW (ref 12.0–15.0)
Immature Granulocytes: 0 %
Lymphocytes Relative: 13 %
Lymphs Abs: 0.4 10*3/uL — ABNORMAL LOW (ref 0.7–4.0)
MCH: 35.4 pg — ABNORMAL HIGH (ref 26.0–34.0)
MCHC: 32.3 g/dL (ref 30.0–36.0)
MCV: 109.5 fL — ABNORMAL HIGH (ref 80.0–100.0)
Monocytes Absolute: 0.2 10*3/uL (ref 0.1–1.0)
Monocytes Relative: 6 %
Neutro Abs: 2.4 10*3/uL (ref 1.7–7.7)
Neutrophils Relative %: 77 %
Platelets: 418 10*3/uL — ABNORMAL HIGH (ref 150–400)
RBC: 2.94 MIL/uL — ABNORMAL LOW (ref 3.87–5.11)
RDW: 15.6 % — ABNORMAL HIGH (ref 11.5–15.5)
WBC: 3 10*3/uL — ABNORMAL LOW (ref 4.0–10.5)
nRBC: 0 % (ref 0.0–0.2)

## 2020-05-11 LAB — COMPREHENSIVE METABOLIC PANEL
ALT: 7 U/L (ref 0–44)
AST: 17 U/L (ref 15–41)
Albumin: 3.4 g/dL — ABNORMAL LOW (ref 3.5–5.0)
Alkaline Phosphatase: 75 U/L (ref 38–126)
Anion gap: 10 (ref 5–15)
BUN: 20 mg/dL (ref 8–23)
CO2: 23 mmol/L (ref 22–32)
Calcium: 10 mg/dL (ref 8.9–10.3)
Chloride: 106 mmol/L (ref 98–111)
Creatinine, Ser: 1.11 mg/dL — ABNORMAL HIGH (ref 0.44–1.00)
GFR calc Af Amer: >60 mL/min (ref >60)
GFR calc non Af Amer: 53 mL/min — ABNORMAL LOW (ref >60)
Glucose, Bld: 111 mg/dL — ABNORMAL HIGH (ref 70–99)
Potassium: 4.4 mmol/L (ref 3.5–5.1)
Sodium: 139 mmol/L (ref 135–145)
Total Bilirubin: 0.4 mg/dL (ref 0.3–1.2)
Total Protein: 7.1 g/dL (ref 6.5–8.1)

## 2020-05-11 NOTE — Telephone Encounter (Signed)
Scheduled appts per 8/11 los. Pt declined print out of AVS and stated she would refer to mychart.

## 2020-05-12 LAB — CANCER ANTIGEN 27.29: CA 27.29: 205.8 U/mL — ABNORMAL HIGH (ref 0.0–38.6)

## 2020-05-31 ENCOUNTER — Encounter: Payer: Self-pay | Admitting: Oncology

## 2020-05-31 ENCOUNTER — Other Ambulatory Visit: Payer: Self-pay | Admitting: Oncology

## 2020-05-31 MED ORDER — OXYCODONE HCL ER 20 MG PO T12A: 20.0000 mg | EXTENDED_RELEASE_TABLET | Freq: Two times a day (BID) | ORAL | 0 refills | Status: DC

## 2020-06-08 ENCOUNTER — Ambulatory Visit (HOSPITAL_COMMUNITY)
Admission: RE | Admit: 2020-06-08 | Discharge: 2020-06-08 | Disposition: A | Discharge status: Home / Self Care | Payer: MEDICARE | Source: Ambulatory Visit | Attending: Oncology | Admitting: Oncology

## 2020-06-08 ENCOUNTER — Inpatient Hospital Stay: Payer: MEDICARE | Attending: Oncology

## 2020-06-08 ENCOUNTER — Ambulatory Visit (HOSPITAL_COMMUNITY): Payer: MEDICARE

## 2020-06-08 ENCOUNTER — Other Ambulatory Visit: Payer: Self-pay

## 2020-06-08 DIAGNOSIS — Z800 Family history of malignant neoplasm of digestive organs: Secondary | ICD-10-CM | POA: Insufficient documentation

## 2020-06-08 DIAGNOSIS — Z170 Estrogen receptor positive status [ER+]: Secondary | ICD-10-CM | POA: Diagnosis not present

## 2020-06-08 DIAGNOSIS — C7951 Secondary malignant neoplasm of bone: Secondary | ICD-10-CM | POA: Diagnosis not present

## 2020-06-08 DIAGNOSIS — I10 Essential (primary) hypertension: Secondary | ICD-10-CM | POA: Diagnosis not present

## 2020-06-08 DIAGNOSIS — Z90710 Acquired absence of both cervix and uterus: Secondary | ICD-10-CM | POA: Insufficient documentation

## 2020-06-08 DIAGNOSIS — M879 Osteonecrosis, unspecified: Secondary | ICD-10-CM

## 2020-06-08 DIAGNOSIS — K5730 Diverticulosis of large intestine without perforation or abscess without bleeding: Secondary | ICD-10-CM | POA: Diagnosis not present

## 2020-06-08 DIAGNOSIS — Z833 Family history of diabetes mellitus: Secondary | ICD-10-CM | POA: Diagnosis not present

## 2020-06-08 DIAGNOSIS — C50919 Malignant neoplasm of unspecified site of unspecified female breast: Secondary | ICD-10-CM

## 2020-06-08 DIAGNOSIS — M25452 Effusion, left hip: Secondary | ICD-10-CM | POA: Diagnosis not present

## 2020-06-08 DIAGNOSIS — C50911 Malignant neoplasm of unspecified site of right female breast: Secondary | ICD-10-CM | POA: Insufficient documentation

## 2020-06-08 DIAGNOSIS — Z803 Family history of malignant neoplasm of breast: Secondary | ICD-10-CM | POA: Insufficient documentation

## 2020-06-08 DIAGNOSIS — Z79811 Long term (current) use of aromatase inhibitors: Secondary | ICD-10-CM | POA: Diagnosis not present

## 2020-06-08 DIAGNOSIS — R102 Pelvic and perineal pain: Secondary | ICD-10-CM | POA: Diagnosis not present

## 2020-06-08 DIAGNOSIS — Z7982 Long term (current) use of aspirin: Secondary | ICD-10-CM | POA: Insufficient documentation

## 2020-06-08 DIAGNOSIS — C7800 Secondary malignant neoplasm of unspecified lung: Secondary | ICD-10-CM

## 2020-06-08 DIAGNOSIS — Z9011 Acquired absence of right breast and nipple: Secondary | ICD-10-CM | POA: Diagnosis not present

## 2020-06-08 DIAGNOSIS — Z79899 Other long term (current) drug therapy: Secondary | ICD-10-CM | POA: Diagnosis not present

## 2020-06-08 LAB — COMPREHENSIVE METABOLIC PANEL
ALT: 9 U/L (ref 0–44)
AST: 18 U/L (ref 15–41)
Albumin: 3.3 g/dL — ABNORMAL LOW (ref 3.5–5.0)
Alkaline Phosphatase: 73 U/L (ref 38–126)
Anion gap: 9 (ref 5–15)
BUN: 22 mg/dL (ref 8–23)
CO2: 25 mmol/L (ref 22–32)
Calcium: 9.4 mg/dL (ref 8.9–10.3)
Chloride: 106 mmol/L (ref 98–111)
Creatinine, Ser: 1.22 mg/dL — ABNORMAL HIGH (ref 0.44–1.00)
GFR calc Af Amer: 55 mL/min — ABNORMAL LOW (ref >60)
GFR calc non Af Amer: 47 mL/min — ABNORMAL LOW (ref >60)
Glucose, Bld: 101 mg/dL — ABNORMAL HIGH (ref 70–99)
Potassium: 4.7 mmol/L (ref 3.5–5.1)
Sodium: 140 mmol/L (ref 135–145)
Total Bilirubin: 0.4 mg/dL (ref 0.3–1.2)
Total Protein: 6.7 g/dL (ref 6.5–8.1)

## 2020-06-08 LAB — CBC WITH DIFFERENTIAL/PLATELET
Abs Immature Granulocytes: 0.02 10*3/uL (ref 0.00–0.07)
Basophils Absolute: 0.1 10*3/uL (ref 0.0–0.1)
Basophils Relative: 3 %
Eosinophils Absolute: 0 10*3/uL (ref 0.0–0.5)
Eosinophils Relative: 1 %
HCT: 30.2 % — ABNORMAL LOW (ref 36.0–46.0)
Hemoglobin: 10 g/dL — ABNORMAL LOW (ref 12.0–15.0)
Immature Granulocytes: 1 %
Lymphocytes Relative: 14 %
Lymphs Abs: 0.5 10*3/uL — ABNORMAL LOW (ref 0.7–4.0)
MCH: 35.3 pg — ABNORMAL HIGH (ref 26.0–34.0)
MCHC: 33.1 g/dL (ref 30.0–36.0)
MCV: 106.7 fL — ABNORMAL HIGH (ref 80.0–100.0)
Monocytes Absolute: 0.2 10*3/uL (ref 0.1–1.0)
Monocytes Relative: 5 %
Neutro Abs: 2.8 10*3/uL (ref 1.7–7.7)
Neutrophils Relative %: 76 %
Platelets: 427 10*3/uL — ABNORMAL HIGH (ref 150–400)
RBC: 2.83 MIL/uL — ABNORMAL LOW (ref 3.87–5.11)
RDW: 15 % (ref 11.5–15.5)
WBC: 3.7 10*3/uL — ABNORMAL LOW (ref 4.0–10.5)
nRBC: 0 % (ref 0.0–0.2)

## 2020-06-08 MED ORDER — GADOBUTROL 1 MMOL/ML IV SOLN
10.0000 mL | Freq: Once | INTRAVENOUS | Status: AC | PRN
  Administered 2020-06-08: 10 mL via INTRAVENOUS

## 2020-06-09 LAB — CANCER ANTIGEN 27.29: CA 27.29: 200.1 U/mL — ABNORMAL HIGH (ref 0.0–38.6)

## 2020-06-13 ENCOUNTER — Telehealth: Payer: Self-pay | Admitting: *Deleted

## 2020-06-13 ENCOUNTER — Other Ambulatory Visit: Payer: Self-pay | Admitting: *Deleted

## 2020-06-13 ENCOUNTER — Other Ambulatory Visit: Payer: Self-pay | Admitting: Oncology

## 2020-06-13 DIAGNOSIS — G620 Drug-induced polyneuropathy: Secondary | ICD-10-CM

## 2020-06-13 DIAGNOSIS — M879 Osteonecrosis, unspecified: Secondary | ICD-10-CM

## 2020-06-13 DIAGNOSIS — C50919 Malignant neoplasm of unspecified site of unspecified female breast: Secondary | ICD-10-CM

## 2020-06-13 DIAGNOSIS — C7951 Secondary malignant neoplasm of bone: Secondary | ICD-10-CM

## 2020-06-13 NOTE — Telephone Encounter (Signed)
This RN faxed records for referral to Dr Lenord Carbo at [facility] for physiatry per MD request.

## 2020-06-13 NOTE — Telephone Encounter (Signed)
Per MD this RN contacted [facility] office for referral for physiatry for noted pain and dysfunction of back and leg per known cancer.  This RN left a detailed message on VM for new pt coordinator with this RN's name, direct return call and fax number.

## 2020-06-13 NOTE — Progress Notes (Signed)
I called [facility] with the results of her pelvic MRI.  It shows significant disease in the right hip area.  She has pain there whenever she walks.  This is the only part of her pain that is not currently well controlled.  We discussed radiation versus pain clinic and she would like to give the pain clinic a try before further radiation.  We are placing that referral today.

## 2020-06-23 ENCOUNTER — Encounter: Payer: Self-pay | Admitting: Oncology

## 2020-06-29 ENCOUNTER — Encounter: Payer: Self-pay | Admitting: Oncology

## 2020-06-30 ENCOUNTER — Other Ambulatory Visit: Payer: Self-pay | Admitting: Oncology

## 2020-06-30 MED ORDER — OXYCODONE HCL ER 20 MG PO T12A
20.0000 mg | EXTENDED_RELEASE_TABLET | Freq: Two times a day (BID) | ORAL | 0 refills | Status: DC
Start: 2020-06-30 — End: 2020-08-02

## 2020-07-04 ENCOUNTER — Other Ambulatory Visit: Payer: Self-pay | Admitting: *Deleted

## 2020-07-05 DIAGNOSIS — C50919 Malignant neoplasm of unspecified site of unspecified female breast: Secondary | ICD-10-CM | POA: Diagnosis not present

## 2020-07-05 DIAGNOSIS — M25551 Pain in right hip: Secondary | ICD-10-CM | POA: Diagnosis not present

## 2020-07-06 ENCOUNTER — Inpatient Hospital Stay (HOSPITAL_BASED_OUTPATIENT_CLINIC_OR_DEPARTMENT_OTHER): Payer: MEDICARE | Admitting: Adult Health

## 2020-07-06 ENCOUNTER — Other Ambulatory Visit: Payer: Self-pay

## 2020-07-06 ENCOUNTER — Encounter: Payer: Self-pay | Admitting: Adult Health

## 2020-07-06 ENCOUNTER — Other Ambulatory Visit: Payer: Self-pay | Admitting: Lab

## 2020-07-06 ENCOUNTER — Inpatient Hospital Stay: Payer: MEDICARE | Attending: Oncology

## 2020-07-06 VITALS — BP 102/63 | HR 71 | Temp 97.2°F | Resp 18 | Ht 64.0 in | Wt 218.3 lb

## 2020-07-06 DIAGNOSIS — C7951 Secondary malignant neoplasm of bone: Secondary | ICD-10-CM | POA: Diagnosis not present

## 2020-07-06 DIAGNOSIS — I10 Essential (primary) hypertension: Secondary | ICD-10-CM | POA: Diagnosis not present

## 2020-07-06 DIAGNOSIS — C50919 Malignant neoplasm of unspecified site of unspecified female breast: Secondary | ICD-10-CM

## 2020-07-06 DIAGNOSIS — Z79899 Other long term (current) drug therapy: Secondary | ICD-10-CM | POA: Insufficient documentation

## 2020-07-06 DIAGNOSIS — G893 Neoplasm related pain (acute) (chronic): Secondary | ICD-10-CM | POA: Insufficient documentation

## 2020-07-06 DIAGNOSIS — Z923 Personal history of irradiation: Secondary | ICD-10-CM | POA: Insufficient documentation

## 2020-07-06 DIAGNOSIS — C7800 Secondary malignant neoplasm of unspecified lung: Secondary | ICD-10-CM | POA: Insufficient documentation

## 2020-07-06 DIAGNOSIS — Z79811 Long term (current) use of aromatase inhibitors: Secondary | ICD-10-CM | POA: Insufficient documentation

## 2020-07-06 DIAGNOSIS — M879 Osteonecrosis, unspecified: Secondary | ICD-10-CM

## 2020-07-06 DIAGNOSIS — C50911 Malignant neoplasm of unspecified site of right female breast: Secondary | ICD-10-CM | POA: Diagnosis not present

## 2020-07-06 LAB — CBC WITH DIFFERENTIAL/PLATELET
Abs Immature Granulocytes: 0.02 10*3/uL (ref 0.00–0.07)
Basophils Absolute: 0.1 10*3/uL (ref 0.0–0.1)
Basophils Relative: 2 %
Eosinophils Absolute: 0 10*3/uL (ref 0.0–0.5)
Eosinophils Relative: 1 %
HCT: 30 % — ABNORMAL LOW (ref 36.0–46.0)
Hemoglobin: 9.7 g/dL — ABNORMAL LOW (ref 12.0–15.0)
Immature Granulocytes: 1 %
Lymphocytes Relative: 16 %
Lymphs Abs: 0.5 10*3/uL — ABNORMAL LOW (ref 0.7–4.0)
MCH: 34.9 pg — ABNORMAL HIGH (ref 26.0–34.0)
MCHC: 32.3 g/dL (ref 30.0–36.0)
MCV: 107.9 fL — ABNORMAL HIGH (ref 80.0–100.0)
Monocytes Absolute: 0.2 10*3/uL (ref 0.1–1.0)
Monocytes Relative: 6 %
Neutro Abs: 2.4 10*3/uL (ref 1.7–7.7)
Neutrophils Relative %: 74 %
Platelets: 412 10*3/uL — ABNORMAL HIGH (ref 150–400)
RBC: 2.78 MIL/uL — ABNORMAL LOW (ref 3.87–5.11)
RDW: 15.2 % (ref 11.5–15.5)
WBC: 3.2 10*3/uL — ABNORMAL LOW (ref 4.0–10.5)
nRBC: 0 % (ref 0.0–0.2)

## 2020-07-06 LAB — COMPREHENSIVE METABOLIC PANEL
ALT: 13 U/L (ref 0–44)
AST: 21 U/L (ref 15–41)
Albumin: 3.3 g/dL — ABNORMAL LOW (ref 3.5–5.0)
Alkaline Phosphatase: 69 U/L (ref 38–126)
Anion gap: 8 (ref 5–15)
BUN: 23 mg/dL (ref 8–23)
CO2: 26 mmol/L (ref 22–32)
Calcium: 9.4 mg/dL (ref 8.9–10.3)
Chloride: 105 mmol/L (ref 98–111)
Creatinine, Ser: 1.29 mg/dL — ABNORMAL HIGH (ref 0.44–1.00)
GFR calc non Af Amer: 44 mL/min — ABNORMAL LOW (ref >60)
Glucose, Bld: 105 mg/dL — ABNORMAL HIGH (ref 70–99)
Potassium: 4.8 mmol/L (ref 3.5–5.1)
Sodium: 139 mmol/L (ref 135–145)
Total Bilirubin: 0.4 mg/dL (ref 0.3–1.2)
Total Protein: 6.7 g/dL (ref 6.5–8.1)

## 2020-07-06 NOTE — Progress Notes (Signed)
[place]  [phone] [phone]    ID: Mia [name] DOB: 1956/06/30  MR#: [id number]  XMI#:[id number]  Patient Care Team: Mia Nian, DO as PCP - General (Family Medicine) [name], Mia Dad, MD as Consulting Physician (Oncology) OTHER MD: Mia Austria MD, Mia Sabal PA   CHIEF COMPLAINT: Estrogen receptor positive stage IV breast cancer (s/p right mastectomy)  CURRENT TREATMENT: Letrozole, palbociclib   INTERVAL HISTORY: Mia [name] returns today for follow-up of her etrogen receptor positive stage IV breast cancer.   She underwent MRI pelvis that showed right hip fracture and cancer that is causing her hip pain.  She was recommended radiation versus pain clinic. She opted to try pain clinic first.   She continues on letrozole.  Hot flashes are not a major issue for her and she has no vaginal dryness problems.  She also continues on palbociclib.  Today is her 9th day of her cycle.  She is tolerating this well.    REVIEW OF SYSTEMS: Mia [name]'s right hip pain continues to be a struggle for her.  She is taking Oxycontin 26m BID in addition to percocet about once per day.  She is additionally take aleve if needed.  She notes that she is not constipated, and has miralax to take if needed.  She met with the pain clinic yesterday and will have a hip injection in her right hip on 11/1, she is hopeful that will help.  Otherwise, she is tolerating her treatments well and a detailed ROS was non contributory.    HISTORY OF CURRENT ILLNESS: From the original intake note:  We have reviewed the medical records from [facility] which is the source of the information below:  KQiara Minettiwas initially diagnosed with Stage IIIA (T2N2M0) invasive ductal carcinoma, estrogen receptor positive and HER2 negative right breast cancer in 2000. She underwent right mastectomy, with 4 positive metastatic lymph nodes. She had chemotherapy with  taxol and cytoxan and fulvestrant in the past. She had radiation and completed 5 years of tamoxifen.   She was diagnosed with Stage IV disease 04/18/2005 with metastases to the bone, lung, and additional nodes. She was on capecitabine but was discontinued after rising CA 27-29 and scans.   She started anastrozole and everolimus with XDelton Seein April 2014. She tolerated this well, but this was discontinued in July 2014 due to abnormal blood tests, hyperlipidemia and hyperglycemia.  She began Abraxane 1068m/m2 and Xgeva in August 2014 weekly x3 with 1 week off. She tolerated this treatment well. She discontinued Xgeva January 2016 due to osteonecrosis of the jaw and mouth issues. She continued abraxane alone until her last dose on 11/18/2015 due to neuropathy and disease progression with restaging studies on 11/23/2015 with a CT chest abdomen and pelvis and one scan showing skull, sternal and femoral lesions.   She was switched to gemcitabine starting 01/20/2016 weekly x3 and 1 week off. This was discontinued on 06/15/2016 due to a port related jugular clot on the right side. She was given Lovenox for 3 months.  She was switched to Letrozole 2.5 mg and palbociclib 125 mg starting 07/13/2016. She tolerated this well. Restaging studies with CT chest abdomen and pelvis and bone scan on 07/01/2017 showed stable/ improving lesions. CA 27-29 was stable.  [place] [facility] showing: A 6 mm pulmonary nodule in the left upper lobe  laterally. Progression of metastatic disease to the bones at the L4 and L5 vertebral bodies. Sclerotic metastases at T8, T10, an T11, are similar to previous exams. Sclerotic metastases in the sternum stable. Hepatic steatosis. Aortic atherosclerosis.  Most recent CA-4-29 (January 2019) was 58.  Most recent hemoglobin A1c was 7.1 according to the patient  The  patient's subsequent history is as detailed below.   PAST MEDICAL HISTORY: Past Medical History:  Diagnosis Date  . Breast cancer metastasized to bone ([place])   . Hyperlipidemia   . Hypertension   . Lymphedema of right arm   . Neuropathy associated with cancer ([place])   . Obesity (BMI 35.0-39.9 without comorbidity)      PAST SURGICAL HISTORY: Past Surgical History:  Procedure Laterality Date  . CATARACT EXTRACTION Left   . HYSTERECTOMY ABDOMINAL WITH SALPINGO-OOPHORECTOMY    . MODIFIED RADICAL MASTECTOMY Right      FAMILY HISTORY: Family History  Problem Relation Age of Onset  . Diabetes Mother   . Breast cancer Mother 51  . Cerebral aneurysm Mother   . Pulmonary embolism Father   . Colon cancer Father 67  . Breast cancer Sister 31       noninvasive  . Breast cancer Paternal Grandmother 78    The patient reports she had negative genetic testing at [facility] 2014, report not available. --The patient's father died at age 18 due to a PE, and he also had a history of colon cancer diagnosed at age 87. The patient's mother died at age 6 due to diabetes and survived a cerebral aneurysm. The patient's mother also had a history of breast cancer diagnosed at age 18.  The patient has no brothers and 1 sister. The patient's sister was diagnosed with non invasive breast cancer at age 57. There was a paternal grandmother diagnosed with breast cancer at age 57. The patient denies a family history of ovarian cancer.    GYNECOLOGIC HISTORY:  Patient's last menstrual period was 10/01/2006. Menarche: 64 years old Age at first live birth: 64 years old She is [place] P2.  She is status post hysterectomy with bilateral salpingo- oophorectomy 12/23/2006 with benign pathology ([id number]) She never used HRT.    SOCIAL HISTORY:  Mia [name] is disabled due to her breast cancer. She used to be Surveyor, quantity for a cardiology office. Her husband, Mia [name] is in the process of retiring. He is a  Electrical engineer for a power company. The patient's daughter, Mia [name], lives in [place] and works as a Teaching laboratory technician. The patient's second daughter, [name] is a stay at home mom and is a special needs teacher, who will soon move to [facility]). The patient plans on attending [employer].     ADVANCED DIRECTIVES: In the absence of any documentation to the contrary, the patient's spouse is their HCPOA.    HEALTH MAINTENANCE: Social History   Tobacco Use  . Smoking status: Never Smoker  . Smokeless tobacco: Never Used  Vaping Use  . Vaping Use: Never used  Substance Use Topics  . Alcohol use: Not Currently  . Drug use: Never    Colonoscopy: 2017   PAP:  Bone density: never  Mammogram: 2017   Allergies  Allergen Reactions  . Morphine And Related Anaphylaxis    Current Outpatient Medications  Medication Sig Dispense Refill  . amlodipine-atorvastatin (CADUET) 10-10 MG tablet Take 1 tablet by mouth daily.    Marland Kitchen aspirin 81 MG chewable tablet Chew by mouth daily.    Marland Kitchen  fenofibrate (TRICOR) 145 MG tablet Take 1 tablet (145 mg total) by mouth daily. 90 tablet 3  . fexofenadine (ALLEGRA ALLERGY) 60 MG tablet Take 1 tablet (60 mg total) by mouth 2 (two) times daily.    . fluticasone (FLONASE) 50 MCG/ACT nasal spray SPRAY 2 SPRAYS INTO EACH NOSTRIL EVERY DAY 48 mL 1  . gabapentin (NEURONTIN) 300 MG capsule Take 1 capsule (300 mg total) by mouth 2 (two) times daily. Pt is only taking 2 capsules a day 180 capsule 3  . IBRANCE 125 MG tablet TAKE 1 TABLET DAILY WITH BREAKFAST. TAKE WHOLE WITH FOOD FOR 21 DAYS ON AND 7 DAYS OFF EVERY 28 DAYS 63 tablet 3  . Insulin Admin Supplies MISC Inject 24 Units into the skin daily.    . Insulin Pen Needle 31G X 5 MM MISC UAD for Sq inj for DM qd 100 each PRN  . LANTUS SOLOSTAR 100 UNIT/ML Solostar Pen NJECT 24 UNITS INTO THE SKIN DAILY 15 mL 1  . letrozole (FEMARA) 2.5 MG tablet Take 1 tablet (2.5 mg total) by  mouth daily. 90 tablet 4  . losartan (COZAAR) 50 MG tablet Take 1 tablet (50 mg total) by mouth 2 (two) times daily. 180 tablet 0  . metoprolol succinate (TOPROL-XL) 25 MG 24 hr tablet Take 1 tablet (25 mg total) by mouth daily. 90 tablet 1  . oxyCODONE (OXYCONTIN) 20 mg 12 hr tablet Take 1 tablet (20 mg total) by mouth every 12 (twelve) hours. 60 tablet 0  . oxyCODONE-acetaminophen (PERCOCET) 10-325 MG tablet Take 1 tablet by mouth every 6 (six) hours as needed for pain. 140 tablet 0  . pantoprazole (PROTONIX) 40 MG tablet Take 1 tablet (40 mg total) by mouth daily. 90 tablet 3  . pioglitazone (ACTOS) 15 MG tablet Take 1 tablet (15 mg total) by mouth daily. 90 tablet 1  . pravastatin (PRAVACHOL) 20 MG tablet Take 1 tablet (20 mg total) by mouth daily. 90 tablet 3  . sitaGLIPtin-metformin (JANUMET) 50-1000 MG tablet Take 1 tablet by mouth daily with breakfast. 90 tablet 3   No current facility-administered medications for this visit.    OBJECTIVE:  Vitals:   07/06/20 1348  BP: 102/63  Pulse: 71  Resp: 18  Temp: (!) 97.2 F (36.2 C)  SpO2: 94%   Wt Readings from Last 3 Encounters:  07/06/20 218 lb 4.8 oz (99 kg)  05/11/20 213 lb 6.4 oz (96.8 kg)  02/17/20 221 lb 4.8 oz (100.4 kg)   Body mass index is 37.47 kg/m.   ECOG FS:2 - Symptomatic, <50% confined to bed GENERAL: Patient is a well appearing female in no acute distress HEENT:  Sclerae anicteric.  Mask in place. Neck is supple.  NODES:  No cervical, supraclavicular, or axillary lymphadenopathy palpated.  LUNGS:  Clear to auscultation bilaterally.  No wheezes or rhonchi. HEART:  Regular rate and rhythm. No murmur appreciated. ABDOMEN:  Soft, nontender.  Positive, normoactive bowel sounds. No organomegaly palpated. MSK:  No focal spinal tenderness to palpation. EXTREMITIES:  No peripheral edema.   SKIN:  Clear with no obvious rashes or skin changes. No nail dyscrasia. NEURO:  Nonfocal. Well oriented.  Appropriate  affect.   LAB RESULTS:  CMP     Component Value Date/Time   NA 139 07/06/2020 1312   NA 140 01/31/2018 0000   K 4.8 07/06/2020 1312   CL 105 07/06/2020 1312   CO2 26 07/06/2020 1312   GLUCOSE 105 (H) 07/06/2020 1312   BUN  23 07/06/2020 1312   BUN 22 (A) 01/31/2018 0000   CREATININE 1.29 (H) 07/06/2020 1312   CREATININE 1.05 (H) 03/24/2019 0833   CALCIUM 9.4 07/06/2020 1312   PROT 6.7 07/06/2020 1312   ALBUMIN 3.3 (L) 07/06/2020 1312   AST 21 07/06/2020 1312   AST 15 03/24/2019 0833   ALT 13 07/06/2020 1312   ALT 16 03/24/2019 0833   ALKPHOS 69 07/06/2020 1312   BILITOT 0.4 07/06/2020 1312   BILITOT 0.3 03/24/2019 0833   GFRNONAA 44 (L) 07/06/2020 1312   GFRNONAA 57 (L) 03/24/2019 0833   GFRAA 55 (L) 06/08/2020 1326   GFRAA >60 03/24/2019 0833    No results found for: TOTALPROTELP, ALBUMINELP, A1GS, A2GS, BETS, BETA2SER, GAMS, MSPIKE, SPEI  No results found for: KPAFRELGTCHN, LAMBDASER, KAPLAMBRATIO  Lab Results  Component Value Date   WBC 3.2 (L) 07/06/2020   NEUTROABS 2.4 07/06/2020   HGB 9.7 (L) 07/06/2020   HCT 30.0 (L) 07/06/2020   MCV 107.9 (H) 07/06/2020   PLT 412 (H) 07/06/2020    No results found for: LABCA2  No components found for: RVIFBP794  No results for input(s): INR in the last 168 hours.  No results found for: LABCA2  No results found for: FEX614  No results found for: JWL295  No results found for: FMB340  Lab Results  Component Value Date   CA2729 200.1 (H) 06/08/2020    No components found for: HGQUANT  No results found for: CEA1 / No results found for: CEA1   No results found for: AFPTUMOR  No results found for: CHROMOGRNA  No results found for: HGBA, HGBA2QUANT, HGBFQUANT, HGBSQUAN (Hemoglobinopathy evaluation)   No results found for: LDH  No results found for: IRON, TIBC, IRONPCTSAT (Iron and TIBC)  No results found for: FERRITIN  Urinalysis No results found for: COLORURINE, APPEARANCEUR, LABSPEC, PHURINE,  GLUCOSEU, HGBUR, BILIRUBINUR, KETONESUR, PROTEINUR, UROBILINOGEN, NITRITE, LEUKOCYTESUR   STUDIES: Refuses mammography at this point (12/23/2019)   ELIGIBLE FOR AVAILABLE RESEARCH PROTOCOL: no   ASSESSMENT: 64 y.o. Mia [name], Mia [name] with stage IV breast cancer, as follows:  (1) status post right mastectomy in 2000, for a pT2 pN2, stage IIIA invasive ductal carcinoma, estrogen receptor positive, progesterone receptor not tested, HER-2 negative (0) by immunohistochemistry  (a) adjuvant chemotherapy with doxorubicin and cyclophosphamide in dose dense fashion x4 followed by paclitaxel in dose dense fashion x4  (b) adjuvant radiation: 30 doses  (c) antiestrogens: Tamoxifen for 5 years, completed 2005  (2) METASTASTIC DISEASE: 2008, involving bones, lungs, and lymph nodes  (a) CA 27-29 is informative  (b) CT of the chest abdomen and pelvis in [facility] finds stable sclerotic metastases (T8, T10, T11, sternum, L4, L5); 0.6 cm left upper lobe lung nodule stable  (3)  prior anti-estrogen treatments:  (a) fulvestrant--progression  (b) exemestane/everolimus--hyperlipidemia, hyperglycemia  (4) prior chemotherapy treatments:  (a) capecitabine: progression  (b) Abraxane, August 2014 through 11/18/2015: good response but stopped due to neuropathy  (c) gemcitabine 01/20/2016--?; multiple interruptions secondary to infections  (5) radiation therapy:  (a) T spine and Right femur, completed 01/10/2016  (6) letrozole/ palbociclib started 07/13/2016  (7) bone treatment:  (a) denosumab/Xgeva--discontinued January 2016 due to osteonecrosis of the jaw  (8) cancer associated pain:  (a) oxycodone/APAP 10/325 QID as needed  (b) PMP Aware reviewed 05/11/2020  (c) OxyContin 20 mg p.o. twice daily added 09/29/2019  (d) bowel prophylaxis: MiraLAX as needed  (9) restaging studies:  (a) mostly stable bone lesions with evidence of progression  at L3-L5; no epidural tumor by total spinal MRI  in November 2019  (b) no evidence of progressive lung, lymph node or liver lesions by CT scans of the chest abdomen and pelvis and bone scan December 2019  (c) CT scans abd/pelvis 03/31/2019 shows stable bone metastases, no extraosseous disease  (d) MRI pelvis 04/09/2019 shows bilateral ilium, left acetabulum and lumbar mets, stable  (e) CT abd/pelvis 09/01/2019 shows stable bone mets, slight increase left effusion  (f) CT of the chest again shows minimal increase in the left pleural effusion, otherwise stable  (10) palliative radiation 11/18/2018 through 12/02/2018 Site/dose:   1. Lumbar spine; 35 Gy in 14 fractions of 2.5 Gy                      2. Pelvis; 35 Gy in 14 fractions of 2.5 Gy   PLAN: Scotty continues on treatment for her metastatic breast cancer with Letrozole and Palbociclib.  She tolerates this well and her WBC is mildly decreased.  She will continue taking this.  She has pain control as it relates to her hip and the cancer involvement.  She will have an injection on 11/1 and I recommend that she delay her restart of Palbociclib until the day of her injection (1 week) to reduce her risk of infection from that procedure.  She understands this.  She will continue on oxycontin and percocet for her pain management.  She is tolerating this well, has no constipation, and is able to remain functional.  We will reassess her in 4 weeks and if the pain is worse, then radiation therapy will be considered.    Leilanie knows to call for any questions that may arise between now and her next appointment.  We are happy to see her sooner if needed.     Total encounter time: 20 minutes*  Wilber Bihari, NP 07/06/20 2:19 PM Medical Oncology and Hematology [facility] [place], [place] [address] Tel. [phone]    Fax. [phone]   *Total Encounter Time as defined by the Centers for Medicare and Medicaid Services includes, in addition to the face-to-face  time of a patient visit (documented in the note above) non-face-to-face time: obtaining and reviewing outside history, ordering and reviewing medications, tests or procedures, care coordination (communications with other health care professionals or caregivers) and documentation in the medical record.

## 2020-07-07 ENCOUNTER — Telehealth: Payer: Self-pay | Admitting: Adult Health

## 2020-07-07 LAB — CANCER ANTIGEN 27.29: CA 27.29: 170.8 U/mL — ABNORMAL HIGH (ref 0.0–38.6)

## 2020-07-07 NOTE — Telephone Encounter (Signed)
Per 10/06 LOS added MD appointment to already scheduled lab notified patient

## 2020-07-11 ENCOUNTER — Other Ambulatory Visit: Payer: Self-pay

## 2020-07-11 MED ORDER — PANTOPRAZOLE SODIUM 40 MG PO TBEC
40.0000 mg | DELAYED_RELEASE_TABLET | Freq: Every day | ORAL | 0 refills | Status: DC
Start: 2020-07-11 — End: 2020-10-05

## 2020-08-01 ENCOUNTER — Encounter: Payer: Self-pay | Admitting: Oncology

## 2020-08-01 DIAGNOSIS — M25551 Pain in right hip: Secondary | ICD-10-CM | POA: Diagnosis not present

## 2020-08-02 ENCOUNTER — Other Ambulatory Visit: Payer: Self-pay | Admitting: Oncology

## 2020-08-02 MED ORDER — OXYCODONE HCL ER 20 MG PO T12A
20.0000 mg | EXTENDED_RELEASE_TABLET | Freq: Two times a day (BID) | ORAL | 0 refills | Status: DC
Start: 2020-08-02 — End: 2020-08-29

## 2020-08-03 ENCOUNTER — Inpatient Hospital Stay: Payer: MEDICARE | Attending: Oncology

## 2020-08-03 ENCOUNTER — Ambulatory Visit: Payer: MEDICARE | Admitting: Adult Health

## 2020-08-03 ENCOUNTER — Other Ambulatory Visit: Payer: Self-pay

## 2020-08-03 ENCOUNTER — Inpatient Hospital Stay (HOSPITAL_BASED_OUTPATIENT_CLINIC_OR_DEPARTMENT_OTHER): Payer: MEDICARE | Admitting: Oncology

## 2020-08-03 ENCOUNTER — Other Ambulatory Visit: Payer: MEDICARE

## 2020-08-03 VITALS — BP 111/61 | HR 81 | Temp 97.7°F | Resp 17 | Ht 64.0 in | Wt 214.2 lb

## 2020-08-03 DIAGNOSIS — Z803 Family history of malignant neoplasm of breast: Secondary | ICD-10-CM | POA: Diagnosis not present

## 2020-08-03 DIAGNOSIS — K760 Fatty (change of) liver, not elsewhere classified: Secondary | ICD-10-CM | POA: Insufficient documentation

## 2020-08-03 DIAGNOSIS — G629 Polyneuropathy, unspecified: Secondary | ICD-10-CM | POA: Insufficient documentation

## 2020-08-03 DIAGNOSIS — G620 Drug-induced polyneuropathy: Secondary | ICD-10-CM

## 2020-08-03 DIAGNOSIS — C50911 Malignant neoplasm of unspecified site of right female breast: Secondary | ICD-10-CM | POA: Insufficient documentation

## 2020-08-03 DIAGNOSIS — M879 Osteonecrosis, unspecified: Secondary | ICD-10-CM | POA: Diagnosis not present

## 2020-08-03 DIAGNOSIS — C7951 Secondary malignant neoplasm of bone: Secondary | ICD-10-CM | POA: Diagnosis not present

## 2020-08-03 DIAGNOSIS — Z7982 Long term (current) use of aspirin: Secondary | ICD-10-CM | POA: Insufficient documentation

## 2020-08-03 DIAGNOSIS — G893 Neoplasm related pain (acute) (chronic): Secondary | ICD-10-CM | POA: Insufficient documentation

## 2020-08-03 DIAGNOSIS — C50919 Malignant neoplasm of unspecified site of unspecified female breast: Secondary | ICD-10-CM

## 2020-08-03 DIAGNOSIS — I700 Atherosclerosis of aorta: Secondary | ICD-10-CM | POA: Insufficient documentation

## 2020-08-03 DIAGNOSIS — I10 Essential (primary) hypertension: Secondary | ICD-10-CM | POA: Diagnosis not present

## 2020-08-03 DIAGNOSIS — R232 Flushing: Secondary | ICD-10-CM | POA: Diagnosis not present

## 2020-08-03 DIAGNOSIS — R739 Hyperglycemia, unspecified: Secondary | ICD-10-CM | POA: Diagnosis not present

## 2020-08-03 DIAGNOSIS — E785 Hyperlipidemia, unspecified: Secondary | ICD-10-CM | POA: Insufficient documentation

## 2020-08-03 DIAGNOSIS — Z8041 Family history of malignant neoplasm of ovary: Secondary | ICD-10-CM | POA: Insufficient documentation

## 2020-08-03 DIAGNOSIS — Z79811 Long term (current) use of aromatase inhibitors: Secondary | ICD-10-CM | POA: Insufficient documentation

## 2020-08-03 DIAGNOSIS — C7800 Secondary malignant neoplasm of unspecified lung: Secondary | ICD-10-CM

## 2020-08-03 DIAGNOSIS — R110 Nausea: Secondary | ICD-10-CM | POA: Insufficient documentation

## 2020-08-03 DIAGNOSIS — R5383 Other fatigue: Secondary | ICD-10-CM | POA: Insufficient documentation

## 2020-08-03 DIAGNOSIS — Z170 Estrogen receptor positive status [ER+]: Secondary | ICD-10-CM | POA: Diagnosis not present

## 2020-08-03 DIAGNOSIS — Z7189 Other specified counseling: Secondary | ICD-10-CM

## 2020-08-03 DIAGNOSIS — Z79899 Other long term (current) drug therapy: Secondary | ICD-10-CM | POA: Diagnosis not present

## 2020-08-03 DIAGNOSIS — T451X5A Adverse effect of antineoplastic and immunosuppressive drugs, initial encounter: Secondary | ICD-10-CM

## 2020-08-03 LAB — CBC WITH DIFFERENTIAL/PLATELET
Abs Immature Granulocytes: 0.29 10*3/uL — ABNORMAL HIGH (ref 0.00–0.07)
Basophils Absolute: 0 10*3/uL (ref 0.0–0.1)
Basophils Relative: 0 %
Eosinophils Absolute: 0 10*3/uL (ref 0.0–0.5)
Eosinophils Relative: 0 %
HCT: 29.8 % — ABNORMAL LOW (ref 36.0–46.0)
Hemoglobin: 9.8 g/dL — ABNORMAL LOW (ref 12.0–15.0)
Immature Granulocytes: 5 %
Lymphocytes Relative: 12 %
Lymphs Abs: 0.8 10*3/uL (ref 0.7–4.0)
MCH: 34.9 pg — ABNORMAL HIGH (ref 26.0–34.0)
MCHC: 32.9 g/dL (ref 30.0–36.0)
MCV: 106 fL — ABNORMAL HIGH (ref 80.0–100.0)
Monocytes Absolute: 0.9 10*3/uL (ref 0.1–1.0)
Monocytes Relative: 14 %
Neutro Abs: 4.5 10*3/uL (ref 1.7–7.7)
Neutrophils Relative %: 69 %
Platelets: 548 10*3/uL — ABNORMAL HIGH (ref 150–400)
RBC: 2.81 MIL/uL — ABNORMAL LOW (ref 3.87–5.11)
RDW: 14.7 % (ref 11.5–15.5)
WBC: 6.4 10*3/uL (ref 4.0–10.5)
nRBC: 0.6 % — ABNORMAL HIGH (ref 0.0–0.2)

## 2020-08-03 LAB — COMPREHENSIVE METABOLIC PANEL
ALT: 13 U/L (ref 0–44)
AST: 19 U/L (ref 15–41)
Albumin: 3.3 g/dL — ABNORMAL LOW (ref 3.5–5.0)
Alkaline Phosphatase: 83 U/L (ref 38–126)
Anion gap: 7 (ref 5–15)
BUN: 29 mg/dL — ABNORMAL HIGH (ref 8–23)
CO2: 25 mmol/L (ref 22–32)
Calcium: 9.2 mg/dL (ref 8.9–10.3)
Chloride: 105 mmol/L (ref 98–111)
Creatinine, Ser: 1.23 mg/dL — ABNORMAL HIGH (ref 0.44–1.00)
GFR, Estimated: 49 mL/min — ABNORMAL LOW (ref >60)
Glucose, Bld: 151 mg/dL — ABNORMAL HIGH (ref 70–99)
Potassium: 4.6 mmol/L (ref 3.5–5.1)
Sodium: 137 mmol/L (ref 135–145)
Total Bilirubin: 0.3 mg/dL (ref 0.3–1.2)
Total Protein: 6.7 g/dL (ref 6.5–8.1)

## 2020-08-03 NOTE — Progress Notes (Signed)
[place]  [phone] [phone]    ID: Mia [name] DOB: 1956/09/25  MR#: [id number]  GBE#:[id number]  Patient Care Team: Ronnald Nian, DO as PCP - General (Family Medicine) Jodey Burbano, Virgie Dad, MD as Consulting Physician (Oncology) [name], [name] PA   CHIEF COMPLAINT: Estrogen receptor positive stage IV breast cancer (s/p right mastectomy)  CURRENT TREATMENT: Letrozole, palbociclib   INTERVAL HISTORY: Mia [name] returns today for follow-up of her estrogen receptor positive stage IV breast cancer.   She continues on letrozole.  Hot flashes are not a major issue for her and she has no vaginal dryness problems.  She also continues on palbociclib. She is tolerating this well.  Specifically nausea and fatigue are not major issues for her.   REVIEW OF SYSTEMS: Cj had a "shot" into her right hip (at the neurosurgeons office?)  And she says this is helping quite a bit although it has made her very tired.  She is looking forward to the holidays which she will spend partly with her daughter in [name] and partly with her daughter here in town.  Her pain is well controlled on her current medications which include MS Contin 20 mg twice daily and occasional, now fairly rare use of Percocet.  She is not constipated from this.  A detailed review of systems was otherwise stable.   COVID 19 VACCINATION STATUS: [place] x2, with the booster scheduled for 08/16/2020   HISTORY OF CURRENT ILLNESS: From the original intake note:  We have reviewed the medical records from [place], which is the source of the information below:  Mia [name] was initially diagnosed with Stage IIIA (T2N2M0) invasive ductal carcinoma, estrogen receptor positive and HER2 negative right breast cancer in 2000. She underwent right mastectomy, with 4 positive metastatic lymph nodes. She had chemotherapy with taxol and cytoxan and  fulvestrant in the past. She had radiation and completed 5 years of tamoxifen.   She was diagnosed with Stage IV disease 04/18/2005 with metastases to the bone, lung, and additional nodes. She was on capecitabine but was discontinued after rising CA 27-29 and scans.   She started anastrozole and everolimus with Delton See in April 2014. She tolerated this well, but this was discontinued in July 2014 due to abnormal blood tests, hyperlipidemia and hyperglycemia.  She began Abraxane 142m /m2 and Xgeva in August 2014 weekly x3 with 1 week off. She tolerated this treatment well. She discontinued Xgeva January 2016 due to osteonecrosis of the jaw and mouth issues. She continued abraxane alone until her last dose on 11/18/2015 due to neuropathy and disease progression with restaging studies on 11/23/2015 with a CT chest abdomen and pelvis and one scan showing skull, sternal and femoral lesions.   She was switched to gemcitabine starting 01/20/2016 weekly x3 and 1 week off. This was discontinued on 06/15/2016 due to a port related jugular clot on the right side. She was given Lovenox for 3 months.  She was switched to Letrozole 2.5 mg and palbociclib 125 mg starting 07/13/2016. She tolerated this well. Restaging studies with CT chest abdomen and pelvis and bone scan on 07/01/2017 showed stable/ improving lesions. CA 27-29 was stable.  During the last few months of follow up in [place] she completed restaging [facility]: A 6 mm pulmonary nodule in the left upper lobe laterally. Progression of metastatic disease to the bones at the L4 and L5 vertebral  bodies. Sclerotic metastases at T8, T10, an T11, are similar to previous exams. Sclerotic metastases in the sternum stable. Hepatic steatosis. Aortic atherosclerosis.  Most recent CA-38-29 (January 2019) was 58.  Most recent hemoglobin A1c was 7.1 according to the patient  The patient's subsequent  history is as detailed below.   PAST MEDICAL HISTORY: Past Medical History:  Diagnosis Date  . Breast cancer metastasized to bone ([place])   . Hyperlipidemia   . Hypertension   . Lymphedema of right arm   . Neuropathy associated with cancer ([place])   . Obesity (BMI 35.0-39.9 without comorbidity)      PAST SURGICAL HISTORY: Past Surgical History:  Procedure Laterality Date  . CATARACT EXTRACTION Left   . HYSTERECTOMY ABDOMINAL WITH SALPINGO-OOPHORECTOMY    . MODIFIED RADICAL MASTECTOMY Right      FAMILY HISTORY: Family History  Problem Relation Age of Onset  . Diabetes Mother   . Breast cancer Mother 67  . Cerebral aneurysm Mother   . Pulmonary embolism Father   . Colon cancer Father 55  . Breast cancer Sister 11       noninvasive  . Breast cancer Paternal Grandmother 78    The patient reports she had negative genetic testing at [facility] 2014, report not available. --The patient's father died at age 19 due to a PE, and he also had a history of colon cancer diagnosed at age 32. The patient's mother died at age 25 due to diabetes and survived a cerebral aneurysm. The patient's mother also had a history of breast cancer diagnosed at age 53.  The patient has no brothers and 1 sister. The patient's sister was diagnosed with non invasive breast cancer at age 92. There was a paternal grandmother diagnosed with breast cancer at age 66. The patient denies a family history of ovarian cancer.    GYNECOLOGIC HISTORY:  Patient's last menstrual period was 10/01/2006. Menarche: 64 years old Age at first live birth: 64 years old She is [place] P2.  She is status post hysterectomy with bilateral salpingo- oophorectomy 12/23/2006 with benign pathology ([id number]) She never used HRT.    SOCIAL HISTORY:  [name] is disabled due to her breast cancer. She used to be Surveyor, quantity for a cardiology office. Her husband, [name] is in the process of retiring. He is a Patent attorney for a power company. The patient's daughter, Mia [name], lives in [place] and works as a Teaching laboratory technician. The patient's second daughter, Mia [name] is a stay at home mom and is a special needs teacher, who will soon move to [facility] [place] The patient plans on attending [employer].     ADVANCED DIRECTIVES: In the absence of any documentation to the contrary, the patient's spouse is their HCPOA.    HEALTH MAINTENANCE: Social History   Tobacco Use  . Smoking status: Never Smoker  . Smokeless tobacco: Never Used  Vaping Use  . Vaping Use: Never used  Substance Use Topics  . Alcohol use: Not Currently  . Drug use: Never    Colonoscopy: 2017   PAP:  Bone density: never  Mammogram: 2017   Allergies  Allergen Reactions  . Morphine And Related Anaphylaxis    Current Outpatient Medications  Medication Sig Dispense Refill  . amlodipine-atorvastatin (CADUET) 10-10 MG tablet Take 1 tablet by mouth daily.    Marland Kitchen aspirin 81 MG chewable tablet Chew by mouth daily.    . fenofibrate (TRICOR) 145 MG tablet Take 1 tablet (145 mg  total) by mouth daily. 90 tablet 3  . fexofenadine (ALLEGRA ALLERGY) 60 MG tablet Take 1 tablet (60 mg total) by mouth 2 (two) times daily.    . fluticasone (FLONASE) 50 MCG/ACT nasal spray SPRAY 2 SPRAYS INTO EACH NOSTRIL EVERY DAY 48 mL 1  . gabapentin (NEURONTIN) 300 MG capsule Take 1 capsule (300 mg total) by mouth 2 (two) times daily. Pt is only taking 2 capsules a day 180 capsule 3  . IBRANCE 125 MG tablet TAKE 1 TABLET DAILY WITH BREAKFAST. TAKE WHOLE WITH FOOD FOR 21 DAYS ON AND 7 DAYS OFF EVERY 28 DAYS 63 tablet 3  . Insulin Admin Supplies MISC Inject 24 Units into the skin daily.    . Insulin Pen Needle 31G X 5 MM MISC UAD for Sq inj for DM qd 100 each PRN  . LANTUS SOLOSTAR 100 UNIT/ML Solostar Pen NJECT 24 UNITS INTO THE SKIN DAILY 15 mL 1  . letrozole (FEMARA) 2.5 MG tablet Take 1 tablet (2.5 mg total) by mouth daily. 90  tablet 4  . losartan (COZAAR) 50 MG tablet Take 1 tablet (50 mg total) by mouth 2 (two) times daily. 180 tablet 0  . metoprolol succinate (TOPROL-XL) 25 MG 24 hr tablet Take 1 tablet (25 mg total) by mouth daily. 90 tablet 1  . oxyCODONE (OXYCONTIN) 20 mg 12 hr tablet Take 1 tablet (20 mg total) by mouth every 12 (twelve) hours. 60 tablet 0  . oxyCODONE-acetaminophen (PERCOCET) 10-325 MG tablet Take 1 tablet by mouth every 6 (six) hours as needed for pain. 140 tablet 0  . pantoprazole (PROTONIX) 40 MG tablet Take 1 tablet (40 mg total) by mouth daily. 90 tablet 0  . pioglitazone (ACTOS) 15 MG tablet Take 1 tablet (15 mg total) by mouth daily. 90 tablet 1  . pravastatin (PRAVACHOL) 20 MG tablet Take 1 tablet (20 mg total) by mouth daily. 90 tablet 3  . sitaGLIPtin-metformin (JANUMET) 50-1000 MG tablet Take 1 tablet by mouth daily with breakfast. 90 tablet 3   No current facility-administered medications for this visit.    OBJECTIVE: White woman who appears stated age 37:   08/03/20 1334  BP: 111/61  Pulse: 81  Resp: 17  Temp: 97.7 F (36.5 C)  SpO2: 98%   Wt Readings from Last 3 Encounters:  08/03/20 214 lb 3.2 oz (97.2 kg)  07/06/20 218 lb 4.8 oz (99 kg)  05/11/20 213 lb 6.4 oz (96.8 kg)   Body mass index is 36.77 kg/m.   ECOG FS:2 - Symptomatic, <50% confined to bed  Sclerae unicteric, EOMs intact Wearing a mask No cervical or supraclavicular adenopathy Lungs no rales or rhonchi Heart regular rate and rhythm Abd soft, nontender, positive bowel sounds MSK no focal spinal tenderness, no upper extremity lymphedema Neuro: nonfocal, well oriented, appropriate affect Breasts: Deferred  LAB RESULTS:  CMP   No results found for: TOTALPROTELP, ALBUMINELP, A1GS, A2GS, BETS, BETA2SER, GAMS, MSPIKE, SPEI  No results found for: KPAFRELGTCHN, LAMBDASER, KAPLAMBRATIO  Lab Results  Component Value Date   WBC 6.4 08/03/2020   NEUTROABS 4.5 08/03/2020   HGB 9.8 (L) 08/03/2020    HCT 29.8 (L) 08/03/2020   MCV 106.0 (H) 08/03/2020   PLT 548 (H) 08/03/2020    No results found for: LABCA2  No components found for: [id number]  No results for input(s): INR in the last 168 hours.  No results found for: LABCA2  No results found for: RFF638  No results found for: GYK599  No  results found for: ZOX096  Lab Results  Component Value Date   CA2729 170.8 (H) 07/06/2020    No components found for: HGQUANT  No results found for: CEA1 / No results found for: CEA1   No results found for: AFPTUMOR  No results found for: CHROMOGRNA  No results found for: HGBA, HGBA2QUANT, HGBFQUANT, HGBSQUAN (Hemoglobinopathy evaluation)   No results found for: LDH  No results found for: IRON, TIBC, IRONPCTSAT (Iron and TIBC)  No results found for: FERRITIN  Urinalysis No results found for: COLORURINE, APPEARANCEUR, LABSPEC, PHURINE, GLUCOSEU, HGBUR, BILIRUBINUR, KETONESUR, PROTEINUR, UROBILINOGEN, NITRITE, LEUKOCYTESUR   STUDIES: Refuses mammography at this point (12/23/2019)   ELIGIBLE FOR AVAILABLE RESEARCH PROTOCOL: no   ASSESSMENT: 64 y.o. [place], [place] woman with stage IV breast cancer, as follows:  (1) status post right mastectomy in 2000, for a pT2 pN2, stage IIIA invasive ductal carcinoma, estrogen receptor positive, progesterone receptor not tested, HER-2 negative (0) by immunohistochemistry  (a) adjuvant chemotherapy with doxorubicin and cyclophosphamide in dose dense fashion x4 followed by paclitaxel in dose dense fashion x4  (b) adjuvant radiation: 30 doses  (c) antiestrogens: Tamoxifen for 5 years, completed 2005  (2) METASTASTIC DISEASE: 2008, involving bones, lungs, and lymph nodes  (a) CA 27-29 is informative  (b) CT of the chest abdomen and pelvis in [facility] finds stable sclerotic metastases (T8, T10, T11, sternum, L4, L5); 0.6 cm left upper lobe lung nodule stable  (3)  prior anti-estrogen treatments:  (a)  fulvestrant--progression  (b) exemestane/everolimus--hyperlipidemia, hyperglycemia  (4) prior chemotherapy treatments:  (a) capecitabine: progression  (b) Abraxane, August 2014 through 11/18/2015: good response but stopped due to neuropathy  (c) gemcitabine 01/20/2016--?; multiple interruptions secondary to infections  (5) radiation therapy:  (a) T spine and Right femur, completed 01/10/2016  (6) letrozole/ palbociclib started 07/13/2016  (7) bone treatment:  (a) denosumab/Xgeva--discontinued January 2016 due to osteonecrosis of the jaw  (8) cancer associated pain:  (a) oxycodone/APAP 10/325 QID as needed  (b) PMP Aware reviewed 05/11/2020  (c) OxyContin 20 mg p.o. twice daily added 09/29/2019  (d) bowel prophylaxis: MiraLAX as needed  (9) restaging studies:  (a) mostly stable bone lesions with evidence of progression at L3-L5; no epidural tumor by total spinal MRI in November 2019  (b) no evidence of progressive lung, lymph node or liver lesions by CT scans of the chest abdomen and pelvis and bone scan December 2019  (c) CT scans abd/pelvis 03/31/2019 shows stable bone metastases, no extraosseous disease  (d) MRI pelvis 04/09/2019 shows bilateral ilium, left acetabulum and lumbar mets, stable  (e) CT abd/pelvis 09/01/2019 shows stable bone mets, slight increase left effusion  (f) CT of the chest 05/05/2020 again shows minimal increase in the left pleural effusion, possible resorption of bone around the left glenoid lesion, otherwise stable  (10) palliative radiation 11/18/2018 through 12/02/2018 Site/dose:   1. Lumbar spine; 35 Gy in 14 fractions of 2.5 Gy                      2. Pelvis; 35 Gy in 14 fractions of 2.5 Gy   PLAN: Chantale looks exhausted today but actually tells me she is feeling better and having less pain.  I am making no changes in her basic treatment, and in particular she continues on 125 mg of palbociclib with no need for dose reductions.  It is true her  counts are better this week since she took an extra week off so she could  have her "shot".  She will continue the current treatment through the next 2 months.  We are going to restage her with CTs of the chest abdomen and pelvis in January and she will see me again at that time  She knows to call for any other issue that may develop before then  Total encounter time 25 minutes.Sarajane Jews C. Atreus Hasz, MD 08/03/20 5:34 PM Medical Oncology and Hematology [facility] [place], [place] [address] Tel. [phone]    Fax. [phone]   I, Wilburn Mylar, am acting as scribe for Dr. Virgie Dad. Briseis Aguilera.  I, Lurline Del MD, have reviewed the above documentation for accuracy and completeness, and I agree with the above.    *Total Encounter Time as defined by the Centers for Medicare and Medicaid Services includes, in addition to the face-to-face time of a patient visit (documented in the note above) non-face-to-face time: obtaining and reviewing outside history, ordering and reviewing medications, tests or procedures, care coordination (communications with other health care professionals or caregivers) and documentation in the medical record.

## 2020-08-04 LAB — CANCER ANTIGEN 27.29: CA 27.29: 216.9 U/mL — ABNORMAL HIGH (ref 0.0–38.6)

## 2020-08-05 ENCOUNTER — Telehealth: Payer: Self-pay | Admitting: Oncology

## 2020-08-05 NOTE — Telephone Encounter (Signed)
Scheduled appt per 11/3 los - pt is aware of appt date and time

## 2020-08-16 ENCOUNTER — Ambulatory Visit: Payer: MEDICARE | Attending: Internal Medicine

## 2020-08-16 ENCOUNTER — Other Ambulatory Visit (HOSPITAL_BASED_OUTPATIENT_CLINIC_OR_DEPARTMENT_OTHER): Payer: Self-pay | Admitting: Internal Medicine

## 2020-08-16 DIAGNOSIS — Z23 Encounter for immunization: Secondary | ICD-10-CM

## 2020-08-16 MED FILL — PFIZER-BIONTECH COVID-19 VA: 30 | 1 days supply | Qty: 0 | Fill #0

## 2020-08-16 NOTE — Progress Notes (Signed)
   Covid-19 Vaccination Clinic  Name:  Mia [name]    MRN: [id number] DOB: 05-22-1956  08/16/2020  [name] was observed post Covid-19 immunization for 15 minutes without incident. She was provided with Vaccine Information Sheet and instruction to access the V-Safe system.   [name] was instructed to call 911 with any severe reactions post vaccine: Marland Kitchen Difficulty breathing  . Swelling of face and throat  . A fast heartbeat  . A bad rash all over body  . Dizziness and weakness   Immunizations Administered    Name Date Dose VIS Date Route   Pfizer COVID-19 Vaccine 08/16/2020 12:44 PM 0.3 mL 07/20/2020 Intramuscular   Manufacturer: [place]   Lot: [id number]   NDC: [address]-9

## 2020-08-29 ENCOUNTER — Other Ambulatory Visit: Payer: Self-pay | Admitting: Oncology

## 2020-08-29 ENCOUNTER — Encounter: Payer: Self-pay | Admitting: Oncology

## 2020-08-29 MED ORDER — OXYCODONE HCL ER 20 MG PO T12A: 20.0000 mg | EXTENDED_RELEASE_TABLET | Freq: Two times a day (BID) | ORAL | 0 refills | Status: DC

## 2020-08-30 ENCOUNTER — Telehealth: Payer: Self-pay | Admitting: Pharmacist

## 2020-08-30 DIAGNOSIS — Z6837 Body mass index (BMI) 37.0-37.9, adult: Secondary | ICD-10-CM | POA: Diagnosis not present

## 2020-08-30 DIAGNOSIS — C50919 Malignant neoplasm of unspecified site of unspecified female breast: Secondary | ICD-10-CM | POA: Diagnosis not present

## 2020-08-30 DIAGNOSIS — M25551 Pain in right hip: Secondary | ICD-10-CM | POA: Diagnosis not present

## 2020-08-30 DIAGNOSIS — I10 Essential (primary) hypertension: Secondary | ICD-10-CM | POA: Diagnosis not present

## 2020-08-30 NOTE — Telephone Encounter (Signed)
Oral Chemotherapy Pharmacist Encounter   Spoke with patient today to follow up regarding patient's oral chemotherapy medication: Ibrance (palbociclib)  Informed patient that fax had been received from [facility] regarding difficulty contacting patient for next fill of Ibrance. Patient stated she has already spoken with [facility] and her next cycle of [place] has been delivered to her home.   Patient knows to call the office with questions or concerns.  Leron Croak, PharmD, BCPS Hematology/Oncology Clinical Pharmacist [facility] [phone] 08/30/2020 10:12 AM

## 2020-09-20 ENCOUNTER — Encounter: Payer: Self-pay | Admitting: Oncology

## 2020-09-20 ENCOUNTER — Other Ambulatory Visit: Payer: Self-pay | Admitting: Adult Health

## 2020-09-20 ENCOUNTER — Ambulatory Visit (INDEPENDENT_AMBULATORY_CARE_PROVIDER_SITE_OTHER): Payer: MEDICARE

## 2020-09-20 VITALS — Ht 64.0 in | Wt 214.0 lb

## 2020-09-20 DIAGNOSIS — C50919 Malignant neoplasm of unspecified site of unspecified female breast: Secondary | ICD-10-CM

## 2020-09-20 DIAGNOSIS — Z0000 Encounter for general adult medical examination without abnormal findings: Secondary | ICD-10-CM | POA: Diagnosis not present

## 2020-09-20 NOTE — Patient Instructions (Signed)
Mia [name] , Thank you for taking time to complete your Medicare Wellness Visit. I appreciate your ongoing commitment to your health goals. Please review the following plan we discussed and let me know if I can assist you in the future.   Screening recommendations/referrals: Colonoscopy: Completed 06/04/2015-Due 06/03/2025 Mammogram: Followed by Oncology. Bone Density: Not yet indicated Recommended yearly ophthalmology/optometry visit for glaucoma screening and checkup Recommended yearly dental visit for hygiene and checkup  Vaccinations: Influenza vaccine: Due-May obtain vaccine at our office or your local pharmacy. Pneumococcal vaccine: Due at age 21 Tdap vaccine: Up to date- Due-06/24/2022 Shingles vaccine: Discuss with pharmacy Covid-19: Completed vaccines  Advanced directives: Information mailed today  Conditions/risks identified: See problem list  Next appointment: Follow up in one year for your annual wellness visit.   Preventive Care 40-64 Years, Female Preventive care refers to lifestyle choices and visits with your health care provider that can promote health and wellness. What does preventive care include?  A yearly physical exam. This is also called an annual well check.  Dental exams once or twice a year.  Routine eye exams. Ask your health care provider how often you should have your eyes checked.  Personal lifestyle choices, including:  Daily care of your teeth and gums.  Regular physical activity.  Eating a healthy diet.  Avoiding tobacco and drug use.  Limiting alcohol use.  Practicing safe sex.  Taking low-dose aspirin daily starting at age 53.  Taking vitamin and mineral supplements as recommended by your health care provider. What happens during an annual well check? The services and screenings done by your health care provider during your annual well check will depend on your age, overall health, lifestyle risk factors, and family history of  disease. Counseling  Your health care provider may ask you questions about your:  Alcohol use.  Tobacco use.  Drug use.  Emotional well-being.  Home and relationship well-being.  Sexual activity.  Eating habits.  Work and work Statistician.  Method of birth control.  Menstrual cycle.  Pregnancy history. Screening  You may have the following tests or measurements:  Height, weight, and BMI.  Blood pressure.  Lipid and cholesterol levels. These may be checked every 5 years, or more frequently if you are over 27 years old.  Skin check.  Lung cancer screening. You may have this screening every year starting at age 71 if you have a 30-pack-year history of smoking and currently smoke or have quit within the past 15 years.  Fecal occult blood test (FOBT) of the stool. You may have this test every year starting at age 73.  Flexible sigmoidoscopy or colonoscopy. You may have a sigmoidoscopy every 5 years or a colonoscopy every 10 years starting at age 57.  Hepatitis C blood test.  Hepatitis B blood test.  Sexually transmitted disease (STD) testing.  Diabetes screening. This is done by checking your blood sugar (glucose) after you have not eaten for a while (fasting). You may have this done every 1-3 years.  Mammogram. This may be done every 1-2 years. Talk to your health care provider about when you should start having regular mammograms. This may depend on whether you have a family history of breast cancer.  BRCA-related cancer screening. This may be done if you have a family history of breast, ovarian, tubal, or peritoneal cancers.  Pelvic exam and Pap test. This may be done every 3 years starting at age 36. Starting at age 38, this may be done every 5  years if you have a Pap test in combination with an HPV test.  Bone density scan. This is done to screen for osteoporosis. You may have this scan if you are at high risk for osteoporosis. Discuss your test results,  treatment options, and if necessary, the need for more tests with your health care provider. Vaccines  Your health care provider may recommend certain vaccines, such as:  Influenza vaccine. This is recommended every year.  Tetanus, diphtheria, and acellular pertussis (Tdap, Td) vaccine. You may need a Td booster every 10 years.  Zoster vaccine. You may need this after age 61.  Pneumococcal 13-valent conjugate (PCV13) vaccine. You may need this if you have certain conditions and were not previously vaccinated.  Pneumococcal polysaccharide (PPSV23) vaccine. You may need one or two doses if you smoke cigarettes or if you have certain conditions. Talk to your health care provider about which screenings and vaccines you need and how often you need them. This information is not intended to replace advice given to you by your health care provider. Make sure you discuss any questions you have with your health care provider. Document Released: 10/14/2015 Document Revised: 06/06/2016 Document Reviewed: 07/19/2015 Elsevier Interactive Patient Education  2017 [place] Prevention in the Home Falls can cause injuries. They can happen to people of all ages. There are many things you can do to make your home safe and to help prevent falls. What can I do on the outside of my home?  Regularly fix the edges of walkways and driveways and fix any cracks.  Remove anything that might make you trip as you walk through a door, such as a raised step or threshold.  Trim any bushes or trees on the path to your home.  Use bright outdoor lighting.  Clear any walking paths of anything that might make someone trip, such as rocks or tools.  Regularly check to see if handrails are loose or broken. Make sure that both sides of any steps have handrails.  Any raised decks and porches should have guardrails on the edges.  Have any leaves, snow, or ice cleared regularly.  Use sand or salt on walking  paths during winter.  Clean up any spills in your garage right away. This includes oil or grease spills. What can I do in the bathroom?  Use night lights.  Install grab bars by the toilet and in the tub and shower. Do not use towel bars as grab bars.  Use non-skid mats or decals in the tub or shower.  If you need to sit down in the shower, use a plastic, non-slip stool.  Keep the floor dry. Clean up any water that spills on the floor as soon as it happens.  Remove soap buildup in the tub or shower regularly.  Attach bath mats securely with double-sided non-slip rug tape.  Do not have throw rugs and other things on the floor that can make you trip. What can I do in the bedroom?  Use night lights.  Make sure that you have a light by your bed that is easy to reach.  Do not use any sheets or blankets that are too big for your bed. They should not hang down onto the floor.  Have a firm chair that has side arms. You can use this for support while you get dressed.  Do not have throw rugs and other things on the floor that can make you trip. What can I do  in the kitchen?  Clean up any spills right away.  Avoid walking on wet floors.  Keep items that you use a lot in easy-to-reach places.  If you need to reach something above you, use a strong step stool that has a grab bar.  Keep electrical cords out of the way.  Do not use floor polish or wax that makes floors slippery. If you must use wax, use non-skid floor wax.  Do not have throw rugs and other things on the floor that can make you trip. What can I do with my stairs?  Do not leave any items on the stairs.  Make sure that there are handrails on both sides of the stairs and use them. Fix handrails that are broken or loose. Make sure that handrails are as long as the stairways.  Check any carpeting to make sure that it is firmly attached to the stairs. Fix any carpet that is loose or worn.  Avoid having throw rugs at  the top or bottom of the stairs. If you do have throw rugs, attach them to the floor with carpet tape.  Make sure that you have a light switch at the top of the stairs and the bottom of the stairs. If you do not have them, ask someone to add them for you. What else can I do to help prevent falls?  Wear shoes that:  Do not have high heels.  Have rubber bottoms.  Are comfortable and fit you well.  Are closed at the toe. Do not wear sandals.  If you use a stepladder:  Make sure that it is fully opened. Do not climb a closed stepladder.  Make sure that both sides of the stepladder are locked into place.  Ask someone to hold it for you, if possible.  Clearly mark and make sure that you can see:  Any grab bars or handrails.  First and last steps.  Where the edge of each step is.  Use tools that help you move around (mobility aids) if they are needed. These include:  Canes.  Walkers.  Scooters.  Crutches.  Turn on the lights when you go into a dark area. Replace any light bulbs as soon as they burn out.  Set up your furniture so you have a clear path. Avoid moving your furniture around.  If any of your floors are uneven, fix them.  If there are any pets around you, be aware of where they are.  Review your medicines with your doctor. Some medicines can make you feel dizzy. This can increase your chance of falling. Ask your doctor what other things that you can do to help prevent falls. This information is not intended to replace advice given to you by your health care provider. Make sure you discuss any questions you have with your health care provider. Document Released: 07/14/2009 Document Revised: 02/23/2016 Document Reviewed: 10/22/2014 Elsevier Interactive Patient Education  2017 [employer].

## 2020-09-20 NOTE — Progress Notes (Signed)
Subjective:   Mia [name] is a 64 y.o. female who presents for Medicare Annual (Subsequent) preventive examination.  I connected with Mia [name] today by telephone and verified that I am speaking with the correct person using two identifiers. Location patient: home Location provider: work Persons participating in the virtual visit: patient, Marine scientist.    I discussed the limitations, risks, security and privacy concerns of performing an evaluation and management service by telephone and the availability of in person appointments. I also discussed with the patient that there may be a patient responsible charge related to this service. The patient expressed understanding and verbally consented to this telephonic visit.    Interactive audio and video telecommunications were attempted between this provider and patient, however failed, due to patient having technical difficulties OR patient did not have access to video capability.  We continued and completed visit with audio only.  Some vital signs may be absent or patient reported.   Time Spent with patient on telephone encounter: 30 minutes   Review of Systems     Cardiac Risk Factors include: advanced age (>39men, >48 women);diabetes mellitus;dyslipidemia;hypertension;sedentary lifestyle;obesity (BMI >30kg/m2)     Objective:    Today's Vitals   09/20/20 1500  Weight: 214 lb (97.1 kg)  Height: 5\' 4"  (1.626 m)  PainSc: 9    Body mass index is 36.73 kg/m.  Advanced Directives 09/20/2020 07/06/2020 09/16/2019 10/02/2018 03/17/2018  Does Patient Have a Medical Advance Directive? Yes Yes Yes Yes Yes  Type of Paramedic of [place];Living will [place];Living will [place];Living will Healthcare Power of [place];Living will  Does patient want to make changes to medical advance directive? - - No - Patient declined - -  Copy of [place] in Chart? No - copy requested No - copy requested No - copy requested No - copy requested No - copy requested    Current Medications (verified) Outpatient Encounter Medications as of 09/20/2020  Medication Sig  . amlodipine-atorvastatin (CADUET) 10-10 MG tablet Take 1 tablet by mouth daily.  Marland Kitchen aspirin 81 MG chewable tablet Chew by mouth daily.  . fenofibrate (TRICOR) 145 MG tablet Take 1 tablet (145 mg total) by mouth daily.  . fexofenadine (ALLEGRA) 60 MG tablet Take 1 tablet (60 mg total) by mouth 2 (two) times daily.  . fluticasone (FLONASE) 50 MCG/ACT nasal spray SPRAY 2 SPRAYS INTO EACH NOSTRIL EVERY DAY  . gabapentin (NEURONTIN) 300 MG capsule Take 1 capsule (300 mg total) by mouth 2 (two) times daily. Pt is only taking 2 capsules a day  . IBRANCE 125 MG tablet TAKE 1 TABLET DAILY WITH BREAKFAST. TAKE WHOLE WITH FOOD FOR 21 DAYS ON AND 7 DAYS OFF EVERY 28 DAYS  . Insulin Admin Supplies MISC Inject 24 Units into the skin daily.  . Insulin Pen Needle 31G X 5 MM MISC UAD for Sq inj for DM qd  . LANTUS SOLOSTAR 100 UNIT/ML Solostar Pen NJECT 24 UNITS INTO THE SKIN DAILY  . letrozole (FEMARA) 2.5 MG tablet Take 1 tablet (2.5 mg total) by mouth daily.  Marland Kitchen losartan (COZAAR) 50 MG tablet Take 1 tablet (50 mg total) by mouth 2 (two) times daily.  . metoprolol succinate (TOPROL-XL) 25 MG 24 hr tablet Take 1 tablet (25 mg total) by mouth daily.  Marland Kitchen oxyCODONE (OXYCONTIN) 20 mg 12 hr tablet Take 1 tablet (20 mg total) by mouth every 12 (twelve) hours.  Marland Kitchen oxyCODONE-acetaminophen (PERCOCET)  10-325 MG tablet Take 1 tablet by mouth every 6 (six) hours as needed for pain.  . pantoprazole (PROTONIX) 40 MG tablet Take 1 tablet (40 mg total) by mouth daily.  . pioglitazone (ACTOS) 15 MG tablet Take 1 tablet (15 mg total) by mouth daily.  . pravastatin (PRAVACHOL) 20 MG tablet Take 1 tablet (20 mg total) by mouth daily.  . sitaGLIPtin-metformin (JANUMET) 50-1000 MG tablet Take 1 tablet by mouth daily  with breakfast.   No facility-administered encounter medications on file as of 09/20/2020.    Allergies (verified) Morphine and related   History: Past Medical History:  Diagnosis Date  . Breast cancer metastasized to bone ([place])   . Hyperlipidemia   . Hypertension   . Lymphedema of right arm   . Neuropathy associated with cancer ([place])   . Obesity (BMI 35.0-39.9 without comorbidity)    Past Surgical History:  Procedure Laterality Date  . CATARACT EXTRACTION Left   . HYSTERECTOMY ABDOMINAL WITH SALPINGO-OOPHORECTOMY    . MODIFIED RADICAL MASTECTOMY Right    Family History  Problem Relation Age of Onset  . Diabetes Mother   . Breast cancer Mother 13  . Cerebral aneurysm Mother   . Pulmonary embolism Father   . Colon cancer Father 36  . Breast cancer Sister 60       noninvasive  . Breast cancer Paternal Grandmother 11   Social History   Socioeconomic History  . Marital status: Married    Spouse name: Not on file  . Number of children: Not on file  . Years of education: Not on file  . Highest education level: Not on file  Occupational History  . Not on file  Tobacco Use  . Smoking status: Never Smoker  . Smokeless tobacco: Never Used  Vaping Use  . Vaping Use: Never used  Substance and Sexual Activity  . Alcohol use: Not Currently  . Drug use: Never  . Sexual activity: Not Currently  Other Topics Concern  . Not on file  Social History Narrative  . Not on file   Social Determinants of Health   Financial Resource Strain: Low Risk   . Difficulty of Paying Living Expenses: Not hard at all  Food Insecurity: No Food Insecurity  . Worried About Charity fundraiser in the Last Year: Never true  . Ran Out of Food in the Last Year: Never true  Transportation Needs: No Transportation Needs  . Lack of Transportation (Medical): No  . Lack of Transportation (Non-Medical): No  Physical Activity: Inactive  . Days of Exercise per Week: 0 days  . Minutes of Exercise  per Session: 0 min  Stress: No Stress Concern Present  . Feeling of Stress : Not at all  Social Connections: Moderately Isolated  . Frequency of Communication with Friends and Family: More than three times a week  . Frequency of Social Gatherings with Friends and Family: More than three times a week  . Attends Religious Services: Never  . Active Member of Clubs or Organizations: No  . Attends Archivist Meetings: Never  . Marital Status: Married    Tobacco Counseling Counseling given: Not Answered   Clinical Intake:  Pre-visit preparation completed: Yes  Pain : 0-10 Pain Score: 9  (with walking) Pain Type: Chronic pain Pain Location: Generalized Pain Onset: More than a month ago Pain Frequency: Constant Pain Relieving Factors: percocet  Pain Relieving Factors: percocet  Nutritional Status: BMI > 30  Obese Nutritional Risks: None Diabetes: Yes  CBG done?: No Did pt. bring in CBG monitor from home?: No (phone visit)  How often do you need to have someone help you when you read instructions, pamphlets, or other written materials from your doctor or pharmacy?: 1 - Never What is the last grade level you completed in school?: 12th grade  Diabetes:  Is the patient diabetic?  Yes  If diabetic, was a CBG obtained today?  No  Did the patient bring in their glucometer from home?  No phone visit How often do you monitor your CBG's? never.   Financial Strains and Diabetes Management:  Are you having any financial strains with the device, your supplies or your medication? No .  Does the patient want to be seen by Chronic Care Management for management of their diabetes?  No  Would the patient like to be referred to a Nutritionist or for Diabetic Management?  No never  Diabetic Exams:  Diabetic Eye Exam: Completed 12/03/2019.   Diabetic Foot Exam:  Pt has been advised about the importance in completing this exam. To be completed by PCP.     Interpreter Needed?:  No  Information entered by :: Caroleen Hamman LPn   Activities of Daily Living In your present state of health, do you have any difficulty performing the following activities: 09/20/2020  Hearing? N  Vision? N  Difficulty concentrating or making decisions? N  Walking or climbing stairs? N  Dressing or bathing? N  Doing errands, shopping? N  Preparing Food and eating ? N  Using the Toilet? N  In the past six months, have you accidently leaked urine? N  Do you have problems with loss of bowel control? N  Managing your Medications? N  Managing your Finances? N  Housekeeping or managing your Housekeeping? N  Some recent data might be hidden    Patient Care Team: Ronnald Nian, DO as PCP - General (Family Medicine) Magrinat, Virgie Dad, MD as Consulting Physician (Oncology)  Indicate any recent Medical Services you may have received from other than [facility] providers in the past year (date may be approximate).     Assessment:   This is a routine wellness examination for [place].  Hearing/Vision screen  Hearing Screening   125Hz  250Hz  500Hz  1000Hz  2000Hz  3000Hz  4000Hz  6000Hz  8000Hz   Right ear:           Left ear:           Comments: No issues  Vision Screening Comments: Wears contacts Last eye exam-12/2019-Dr. Dunn  Dietary issues and exercise activities discussed: Current Exercise Habits: The patient does not participate in regular exercise at present, Exercise limited by: Other - see comments (cancer)  Goals    . Patient Stated     Just to maintain health and stay safe during pandemic.    Marland Kitchen Patient Stated     Drink more water      Depression Screen PHQ 2/9 Scores 09/20/2020 09/16/2019 06/03/2018  PHQ - 2 Score 0 0 0    Fall Risk Fall Risk  09/20/2020 09/16/2019 06/03/2018  Falls in the past year? 0 0 No  Number falls in past yr: 0 - -  Injury with Fall? 0 - -  Follow up Falls prevention discussed Education provided;Falls prevention discussed -    FALL RISK  PREVENTION PERTAINING TO THE HOME:  Any stairs in or around the home? No  Home free of loose throw rugs in walkways, pet beds, electrical cords, etc? Yes  Adequate lighting in your  home to reduce risk of falls? Yes   ASSISTIVE DEVICES UTILIZED TO PREVENT FALLS:  Life alert? No  Use of a cane, walker or w/c? Yes  Grab bars in the bathroom? Yes  Shower chair or bench in shower? No  Elevated toilet seat or a handicapped toilet? No   TIMED UP AND GO:  Was the test performed? No .phone visit    Cognitive Function:Normal cognitive status assessed bythis Nurse Health Advisor. No abnormalities found.          Immunizations Immunization History  Administered Date(s) Administered  . Influenza,inj,Quad PF,6+ Mos 09/02/2018, 07/14/2019  . Influenza-Unspecified 06/30/2010, 08/12/2013, 07/02/2014, 07/13/2015, 07/18/2016  . PFIZER SARS-COV-2 Vaccination 08/16/2020  . Pneumococcal Polysaccharide-23 08/11/2008  . Td 10/20/2002  . Tdap 06/24/2012    TDAP status: Up to date  Flu Vaccine status: Due, Education has been provided regarding the importance of this vaccine. Advised may receive this vaccine at local pharmacy or Health Dept. Aware to provide a copy of the vaccination record if obtained from local pharmacy or Health Dept. Verbalized acceptance and understanding.  Pneumococcal vaccine status: Up to date  Covid-19 vaccine status: Completed vaccines  Qualifies for Shingles Vaccine? Yes   Zostavax completed No   Shingrix Completed?: No.    Education has been provided regarding the importance of this vaccine. Patient has been advised to call insurance company to determine out of pocket expense if they have not yet received this vaccine. Advised may also receive vaccine at local pharmacy or Health Dept. Verbalized acceptance and understanding.  Screening Tests Health Maintenance  Topic Date Due  . MAMMOGRAM  06/03/2018  . FOOT EXAM  06/04/2019  . HEMOGLOBIN A1C  12/16/2019  .  INFLUENZA VACCINE  05/01/2020  . COVID-19 Vaccine (2 - Pfizer risk 4-dose series) 09/06/2020  . OPHTHALMOLOGY EXAM  12/02/2020  . TETANUS/TDAP  06/24/2022  . COLONOSCOPY  06/03/2025  . PAP SMEAR-Modifier  Discontinued  . Hepatitis C Screening  Discontinued  . HIV Screening  Discontinued    Health Maintenance  Health Maintenance Due  Topic Date Due  . MAMMOGRAM  06/03/2018  . FOOT EXAM  06/04/2019  . HEMOGLOBIN A1C  12/16/2019  . INFLUENZA VACCINE  05/01/2020  . COVID-19 Vaccine (2 - Pfizer risk 4-dose series) 09/06/2020    Colorectal cancer screening: Type of screening: Colonoscopy. Completed 06/04/2015. Repeat every 10 years  Mammogram status: Patient has breast cancer. Followed by Oncology.  Bone Density status: Not yet indicated.  Lung Cancer Screening: (Low Dose CT Chest recommended if Age 78-80 years, 30 pack-year currently smoking OR have quit w/in 15years.) does not qualify.     Additional Screening:  Hepatitis C Screening: does qualify; Discuss with PCP  Vision Screening: Recommended annual ophthalmology exams for early detection of glaucoma and other disorders of the eye. Is the patient up to date with their annual eye exam?  Yes  Who is the provider or what is the name of the office in which the patient attends annual eye exams? Dr. Idolina Primer   Dental Screening: Recommended annual dental exams for proper oral hygiene  Community Resource Referral / Chronic Care Management: CRR required this visit?  No   CCM required this visit?  No      Plan:     I have personally reviewed and noted the following in the patient's chart:   . Medical and social history . Use of alcohol, tobacco or illicit drugs  . Current medications and supplements . Functional ability and status .  Nutritional status . Physical activity . Advanced directives . List of other physicians . Hospitalizations, surgeries, and ER visits in previous 12 months . Vitals . Screenings to include  cognitive, depression, and falls . Referrals and appointments  In addition, I have reviewed and discussed with patient certain preventive protocols, quality metrics, and best practice recommendations. A written personalized care plan for preventive services as well as general preventive health recommendations were provided to patient.   Due to this being a telephonic visit, the after visit summary with patients personalized plan was offered to patient via mail or my-chart.  Patient would like to access on my-chart.  Marta Antu, LPN   [id number]   Nurse Health Advisor  Nurse Notes: None

## 2020-09-21 ENCOUNTER — Other Ambulatory Visit: Payer: Self-pay

## 2020-09-21 DIAGNOSIS — C50919 Malignant neoplasm of unspecified site of unspecified female breast: Secondary | ICD-10-CM

## 2020-09-21 MED ORDER — GABAPENTIN 300 MG PO CAPS: 300.0000 mg | ORAL_CAPSULE | Freq: Two times a day (BID) | ORAL | 0 refills | Status: DC

## 2020-09-26 ENCOUNTER — Other Ambulatory Visit: Payer: Self-pay | Admitting: Oncology

## 2020-09-29 ENCOUNTER — Other Ambulatory Visit: Payer: Self-pay | Admitting: Adult Health

## 2020-09-29 ENCOUNTER — Other Ambulatory Visit: Payer: Self-pay | Admitting: Oncology

## 2020-09-29 ENCOUNTER — Encounter: Payer: Self-pay | Admitting: Oncology

## 2020-09-29 MED ORDER — OXYCODONE HCL ER 20 MG PO T12A: 20.0000 mg | EXTENDED_RELEASE_TABLET | Freq: Two times a day (BID) | ORAL | 0 refills | Status: DC

## 2020-09-29 NOTE — Progress Notes (Signed)
Refilling Oxycontin per metastatic cancer pain management plan. PMP aware reviewed.  No red flags.  Refill appropriate.    Wilber Bihari, NP

## 2020-09-30 ENCOUNTER — Other Ambulatory Visit: Payer: Self-pay

## 2020-09-30 NOTE — Telephone Encounter (Signed)
I refilled this last night before I left.  Is this a new request?  Did it not go through?

## 2020-09-30 NOTE — Telephone Encounter (Signed)
Mia [name], Refill request

## 2020-10-02 ENCOUNTER — Other Ambulatory Visit: Payer: Self-pay | Admitting: Family Medicine

## 2020-10-02 DIAGNOSIS — Z760 Encounter for issue of repeat prescription: Secondary | ICD-10-CM

## 2020-10-03 ENCOUNTER — Other Ambulatory Visit: Payer: Self-pay | Admitting: Family Medicine

## 2020-10-03 NOTE — Telephone Encounter (Signed)
Last OV 09/20/20 W/Martha Last fill 03/28/20  #67ml/1

## 2020-10-05 NOTE — Telephone Encounter (Signed)
Last OV 09/20/20 w/Martha Last fill 07/11/20  #90/0

## 2020-10-06 ENCOUNTER — Other Ambulatory Visit: Payer: Self-pay | Admitting: Family Medicine

## 2020-10-06 MED ORDER — SITAGLIPTIN PHOS-METFORMIN HCL 50-1000 MG PO TABS: 1.0000 | ORAL_TABLET | Freq: Every day | ORAL | 1 refills | Status: DC

## 2020-10-16 ENCOUNTER — Other Ambulatory Visit: Payer: Self-pay | Admitting: Family Medicine

## 2020-10-16 ENCOUNTER — Other Ambulatory Visit: Payer: Self-pay | Admitting: Oncology

## 2020-10-16 DIAGNOSIS — C50919 Malignant neoplasm of unspecified site of unspecified female breast: Secondary | ICD-10-CM

## 2020-10-17 ENCOUNTER — Other Ambulatory Visit: Payer: Self-pay | Admitting: Family Medicine

## 2020-10-17 NOTE — Telephone Encounter (Signed)
The refill request is to change the current Lantus.   Currently pt is on Lantus Solostar Pen 100 unit/mL Pt is taking 24 units daily. Last sent on 10/17/2020. Last OV was on 05812/2021 No follow up request listed in [place] notes or after visit summary. No upcoming appt with PCP. Pt is being followed by Oncology.   The request is to change to insulin glargline YFGN  Please advise if refill with or without change is approved.

## 2020-10-18 ENCOUNTER — Encounter (HOSPITAL_COMMUNITY): Payer: Self-pay

## 2020-10-18 ENCOUNTER — Other Ambulatory Visit: Payer: Self-pay | Admitting: Oncology

## 2020-10-18 ENCOUNTER — Inpatient Hospital Stay: Payer: MEDICARE | Attending: Oncology

## 2020-10-18 ENCOUNTER — Other Ambulatory Visit: Payer: Self-pay

## 2020-10-18 ENCOUNTER — Ambulatory Visit (HOSPITAL_COMMUNITY)
Admission: RE | Admit: 2020-10-18 | Discharge: 2020-10-18 | Disposition: A | Discharge status: Home / Self Care | Payer: MEDICARE | Source: Ambulatory Visit | Attending: Oncology | Admitting: Oncology

## 2020-10-18 DIAGNOSIS — I2510 Atherosclerotic heart disease of native coronary artery without angina pectoris: Secondary | ICD-10-CM | POA: Diagnosis not present

## 2020-10-18 DIAGNOSIS — Z7189 Other specified counseling: Secondary | ICD-10-CM

## 2020-10-18 DIAGNOSIS — Z90710 Acquired absence of both cervix and uterus: Secondary | ICD-10-CM | POA: Diagnosis not present

## 2020-10-18 DIAGNOSIS — I10 Essential (primary) hypertension: Secondary | ICD-10-CM | POA: Insufficient documentation

## 2020-10-18 DIAGNOSIS — M879 Osteonecrosis, unspecified: Secondary | ICD-10-CM

## 2020-10-18 DIAGNOSIS — Z79899 Other long term (current) drug therapy: Secondary | ICD-10-CM | POA: Diagnosis not present

## 2020-10-18 DIAGNOSIS — Z79811 Long term (current) use of aromatase inhibitors: Secondary | ICD-10-CM | POA: Diagnosis not present

## 2020-10-18 DIAGNOSIS — K760 Fatty (change of) liver, not elsewhere classified: Secondary | ICD-10-CM | POA: Insufficient documentation

## 2020-10-18 DIAGNOSIS — T451X5A Adverse effect of antineoplastic and immunosuppressive drugs, initial encounter: Secondary | ICD-10-CM | POA: Diagnosis not present

## 2020-10-18 DIAGNOSIS — Z923 Personal history of irradiation: Secondary | ICD-10-CM | POA: Diagnosis not present

## 2020-10-18 DIAGNOSIS — I700 Atherosclerosis of aorta: Secondary | ICD-10-CM | POA: Diagnosis not present

## 2020-10-18 DIAGNOSIS — C50919 Malignant neoplasm of unspecified site of unspecified female breast: Secondary | ICD-10-CM | POA: Diagnosis not present

## 2020-10-18 DIAGNOSIS — S32301A Unspecified fracture of right ilium, initial encounter for closed fracture: Secondary | ICD-10-CM | POA: Diagnosis not present

## 2020-10-18 DIAGNOSIS — E669 Obesity, unspecified: Secondary | ICD-10-CM | POA: Insufficient documentation

## 2020-10-18 DIAGNOSIS — C7951 Secondary malignant neoplasm of bone: Secondary | ICD-10-CM | POA: Diagnosis not present

## 2020-10-18 DIAGNOSIS — J90 Pleural effusion, not elsewhere classified: Secondary | ICD-10-CM | POA: Diagnosis not present

## 2020-10-18 DIAGNOSIS — C7800 Secondary malignant neoplasm of unspecified lung: Secondary | ICD-10-CM

## 2020-10-18 DIAGNOSIS — Z7982 Long term (current) use of aspirin: Secondary | ICD-10-CM | POA: Insufficient documentation

## 2020-10-18 DIAGNOSIS — C50911 Malignant neoplasm of unspecified site of right female breast: Secondary | ICD-10-CM | POA: Diagnosis not present

## 2020-10-18 DIAGNOSIS — E785 Hyperlipidemia, unspecified: Secondary | ICD-10-CM | POA: Diagnosis not present

## 2020-10-18 DIAGNOSIS — S32401A Unspecified fracture of right acetabulum, initial encounter for closed fracture: Secondary | ICD-10-CM | POA: Diagnosis not present

## 2020-10-18 DIAGNOSIS — J9811 Atelectasis: Secondary | ICD-10-CM | POA: Diagnosis not present

## 2020-10-18 DIAGNOSIS — Z170 Estrogen receptor positive status [ER+]: Secondary | ICD-10-CM | POA: Diagnosis not present

## 2020-10-18 DIAGNOSIS — Z853 Personal history of malignant neoplasm of breast: Secondary | ICD-10-CM | POA: Diagnosis not present

## 2020-10-18 DIAGNOSIS — Z90722 Acquired absence of ovaries, bilateral: Secondary | ICD-10-CM | POA: Diagnosis not present

## 2020-10-18 DIAGNOSIS — G893 Neoplasm related pain (acute) (chronic): Secondary | ICD-10-CM | POA: Diagnosis not present

## 2020-10-18 DIAGNOSIS — G620 Drug-induced polyneuropathy: Secondary | ICD-10-CM

## 2020-10-18 DIAGNOSIS — Z9011 Acquired absence of right breast and nipple: Secondary | ICD-10-CM | POA: Diagnosis not present

## 2020-10-18 LAB — CBC WITH DIFFERENTIAL/PLATELET
Abs Immature Granulocytes: 0.01 10*3/uL (ref 0.00–0.07)
Basophils Absolute: 0.1 10*3/uL (ref 0.0–0.1)
Basophils Relative: 2 %
Eosinophils Absolute: 0 10*3/uL (ref 0.0–0.5)
Eosinophils Relative: 1 %
HCT: 29.5 % — ABNORMAL LOW (ref 36.0–46.0)
Hemoglobin: 9.5 g/dL — ABNORMAL LOW (ref 12.0–15.0)
Immature Granulocytes: 0 %
Lymphocytes Relative: 22 %
Lymphs Abs: 0.5 10*3/uL — ABNORMAL LOW (ref 0.7–4.0)
MCH: 35.2 pg — ABNORMAL HIGH (ref 26.0–34.0)
MCHC: 32.2 g/dL (ref 30.0–36.0)
MCV: 109.3 fL — ABNORMAL HIGH (ref 80.0–100.0)
Monocytes Absolute: 0.2 10*3/uL (ref 0.1–1.0)
Monocytes Relative: 8 %
Neutro Abs: 1.5 10*3/uL — ABNORMAL LOW (ref 1.7–7.7)
Neutrophils Relative %: 67 %
Platelets: 232 10*3/uL (ref 150–400)
RBC: 2.7 MIL/uL — ABNORMAL LOW (ref 3.87–5.11)
RDW: 15.7 % — ABNORMAL HIGH (ref 11.5–15.5)
WBC: 2.3 10*3/uL — ABNORMAL LOW (ref 4.0–10.5)
nRBC: 0 % (ref 0.0–0.2)

## 2020-10-18 LAB — CMP (CANCER CENTER ONLY)
ALT: 11 U/L (ref 0–44)
AST: 21 U/L (ref 15–41)
Albumin: 3.7 g/dL (ref 3.5–5.0)
Alkaline Phosphatase: 77 U/L (ref 38–126)
Anion gap: 8 (ref 5–15)
BUN: 23 mg/dL (ref 8–23)
CO2: 28 mmol/L (ref 22–32)
Calcium: 9.6 mg/dL (ref 8.9–10.3)
Chloride: 102 mmol/L (ref 98–111)
Creatinine: 1.36 mg/dL — ABNORMAL HIGH (ref 0.44–1.00)
GFR, Estimated: 43 mL/min — ABNORMAL LOW (ref >60)
Glucose, Bld: 109 mg/dL — ABNORMAL HIGH (ref 70–99)
Potassium: 4.9 mmol/L (ref 3.5–5.1)
Sodium: 138 mmol/L (ref 135–145)
Total Bilirubin: 0.4 mg/dL (ref 0.3–1.2)
Total Protein: 7.3 g/dL (ref 6.5–8.1)

## 2020-10-18 MED ORDER — IOHEXOL 9 MG/ML PO SOLN
ORAL | Status: AC
  Filled 2020-10-18: qty 1000

## 2020-10-18 MED ORDER — IOHEXOL 9 MG/ML PO SOLN
500.0000 mL | ORAL | Status: AC
  Administered 2020-10-18: 1000 mL via ORAL

## 2020-10-18 MED ORDER — IOHEXOL 300 MG/ML  SOLN
100.0000 mL | Freq: Once | INTRAMUSCULAR | Status: AC | PRN
  Administered 2020-10-18: 75 mL via INTRAVENOUS

## 2020-10-18 NOTE — Telephone Encounter (Signed)
Pt is overdue for f/u appt. Please have pt schedule DM f/u appt and we can change/refill med at that time

## 2020-10-19 LAB — CANCER ANTIGEN 27.29: CA 27.29: 254.2 U/mL — ABNORMAL HIGH (ref 0.0–38.6)

## 2020-10-21 ENCOUNTER — Other Ambulatory Visit: Payer: Self-pay | Admitting: Oncology

## 2020-10-21 NOTE — Progress Notes (Signed)
I called Mia [name] to let her know I think she is having some evidence of progression. We are going to consider CMF when she returns to see me 10/26/2020

## 2020-10-24 DIAGNOSIS — M25511 Pain in right shoulder: Secondary | ICD-10-CM | POA: Diagnosis not present

## 2020-10-24 DIAGNOSIS — M25551 Pain in right hip: Secondary | ICD-10-CM | POA: Diagnosis not present

## 2020-10-26 ENCOUNTER — Inpatient Hospital Stay: Payer: MEDICARE

## 2020-10-26 ENCOUNTER — Inpatient Hospital Stay (HOSPITAL_BASED_OUTPATIENT_CLINIC_OR_DEPARTMENT_OTHER): Payer: MEDICARE | Admitting: Oncology

## 2020-10-26 ENCOUNTER — Other Ambulatory Visit: Payer: Self-pay

## 2020-10-26 VITALS — BP 133/73 | HR 85 | Temp 97.9°F | Resp 18 | Ht 64.0 in | Wt 214.6 lb

## 2020-10-26 DIAGNOSIS — I700 Atherosclerosis of aorta: Secondary | ICD-10-CM | POA: Diagnosis not present

## 2020-10-26 DIAGNOSIS — M879 Osteonecrosis, unspecified: Secondary | ICD-10-CM

## 2020-10-26 DIAGNOSIS — C50919 Malignant neoplasm of unspecified site of unspecified female breast: Secondary | ICD-10-CM

## 2020-10-26 DIAGNOSIS — C7800 Secondary malignant neoplasm of unspecified lung: Secondary | ICD-10-CM

## 2020-10-26 DIAGNOSIS — C7951 Secondary malignant neoplasm of bone: Secondary | ICD-10-CM | POA: Diagnosis not present

## 2020-10-26 DIAGNOSIS — K760 Fatty (change of) liver, not elsewhere classified: Secondary | ICD-10-CM | POA: Diagnosis not present

## 2020-10-26 DIAGNOSIS — G620 Drug-induced polyneuropathy: Secondary | ICD-10-CM | POA: Diagnosis not present

## 2020-10-26 DIAGNOSIS — T451X5A Adverse effect of antineoplastic and immunosuppressive drugs, initial encounter: Secondary | ICD-10-CM

## 2020-10-26 DIAGNOSIS — Z170 Estrogen receptor positive status [ER+]: Secondary | ICD-10-CM | POA: Diagnosis not present

## 2020-10-26 DIAGNOSIS — Z7189 Other specified counseling: Secondary | ICD-10-CM | POA: Diagnosis not present

## 2020-10-26 DIAGNOSIS — C50911 Malignant neoplasm of unspecified site of right female breast: Secondary | ICD-10-CM | POA: Diagnosis not present

## 2020-10-26 DIAGNOSIS — Z79811 Long term (current) use of aromatase inhibitors: Secondary | ICD-10-CM | POA: Diagnosis not present

## 2020-10-26 DIAGNOSIS — G893 Neoplasm related pain (acute) (chronic): Secondary | ICD-10-CM | POA: Diagnosis not present

## 2020-10-26 LAB — CBC WITH DIFFERENTIAL/PLATELET
Abs Immature Granulocytes: 0.08 10*3/uL — ABNORMAL HIGH (ref 0.00–0.07)
Basophils Absolute: 0 10*3/uL (ref 0.0–0.1)
Basophils Relative: 0 %
Eosinophils Absolute: 0 10*3/uL (ref 0.0–0.5)
Eosinophils Relative: 0 %
HCT: 28.2 % — ABNORMAL LOW (ref 36.0–46.0)
Hemoglobin: 9.1 g/dL — ABNORMAL LOW (ref 12.0–15.0)
Immature Granulocytes: 2 %
Lymphocytes Relative: 11 %
Lymphs Abs: 0.5 10*3/uL — ABNORMAL LOW (ref 0.7–4.0)
MCH: 35.3 pg — ABNORMAL HIGH (ref 26.0–34.0)
MCHC: 32.3 g/dL (ref 30.0–36.0)
MCV: 109.3 fL — ABNORMAL HIGH (ref 80.0–100.0)
Monocytes Absolute: 0.7 10*3/uL (ref 0.1–1.0)
Monocytes Relative: 18 %
Neutro Abs: 2.8 10*3/uL (ref 1.7–7.7)
Neutrophils Relative %: 69 %
Platelets: 324 10*3/uL (ref 150–400)
RBC: 2.58 MIL/uL — ABNORMAL LOW (ref 3.87–5.11)
RDW: 16.1 % — ABNORMAL HIGH (ref 11.5–15.5)
WBC: 4.1 10*3/uL (ref 4.0–10.5)
nRBC: 0 % (ref 0.0–0.2)

## 2020-10-26 LAB — COMPREHENSIVE METABOLIC PANEL
ALT: 8 U/L (ref 0–44)
AST: 16 U/L (ref 15–41)
Albumin: 3.5 g/dL (ref 3.5–5.0)
Alkaline Phosphatase: 77 U/L (ref 38–126)
Anion gap: 9 (ref 5–15)
BUN: 24 mg/dL — ABNORMAL HIGH (ref 8–23)
CO2: 25 mmol/L (ref 22–32)
Calcium: 9.4 mg/dL (ref 8.9–10.3)
Chloride: 104 mmol/L (ref 98–111)
Creatinine, Ser: 1.22 mg/dL — ABNORMAL HIGH (ref 0.44–1.00)
GFR, Estimated: 50 mL/min — ABNORMAL LOW (ref >60)
Glucose, Bld: 106 mg/dL — ABNORMAL HIGH (ref 70–99)
Potassium: 4.4 mmol/L (ref 3.5–5.1)
Sodium: 138 mmol/L (ref 135–145)
Total Bilirubin: 0.4 mg/dL (ref 0.3–1.2)
Total Protein: 7.1 g/dL (ref 6.5–8.1)

## 2020-10-26 MED ORDER — OXYCODONE HCL 5 MG PO TABS: 5.0000 mg | ORAL_TABLET | ORAL | 0 refills | Status: DC | PRN

## 2020-10-26 MED ORDER — GABAPENTIN 300 MG PO CAPS: 300.0000 mg | ORAL_CAPSULE | Freq: Four times a day (QID) | ORAL | 0 refills | Status: DC

## 2020-10-26 MED ORDER — OXYCODONE HCL ER 20 MG PO T12A: 20.0000 mg | EXTENDED_RELEASE_TABLET | Freq: Three times a day (TID) | ORAL | 0 refills | Status: DC

## 2020-10-26 MED ORDER — PROCHLORPERAZINE MALEATE 10 MG PO TABS: 10.0000 mg | ORAL_TABLET | Freq: Four times a day (QID) | ORAL | 1 refills | Status: DC | PRN

## 2020-10-26 NOTE — Progress Notes (Signed)
[place]  [phone] Fax:(336) [phone]    ID: [name] DOB: October 19, 1955  MR#: [id number]  EVO#:[id number]  Patient Care Team: Ronnald Nian, DO as PCP - General (Family Medicine) Cayden Rautio, Virgie Dad, MD as Consulting Physician (Oncology) [name], [name] PA   CHIEF COMPLAINT: Estrogen receptor positive stage IV breast cancer (s/p right mastectomy)  CURRENT TREATMENT: To start C[M]F chemotherapy; considering additional palliative radiation   INTERVAL HISTORY: Mia [name] returns today for follow-up of her estrogen receptor positive stage IV breast cancer.   Since her last visit, she underwent restaging CT chest/abdomen/pelvis on 10/18/2020 showing: slight progression of metastatic disease to bones, most evidence in pelvis where there is a new healing pathologic fracture of right ilium, as well as comminuted pathologic fracture of right acetabulum; no definite evidence of extraskeletal metastatic disease; multiple tiny 1-3 mm pulmonary nodules scattered throughout lungs bilaterally, stable compared to prior; slight enlargement of large left pleural effusion with extensive passive subsegmental atelectasis in left lung, no definitive areas of pleural thickening or enhancement.  She continues on letrozole.  Hot flashes are not a major issue for her and she has no vaginal dryness problems.  She also continues on palbociclib. She is tolerating this well.  Specifically nausea and fatigue are not major issues for her.   REVIEW OF SYSTEMS: Latori has significant pain localized to the right shoulder.  She cannot move the arm unless the other arm moves it.  Any movement in the right arm is exceedingly painful.  She is taking her OxyContin twice daily she is taking Motrin 600 mg 3 times a day, she is taking gabapentin and this is not controlling the pain which is very limiting to her.  She is having regular bowel movements.  She feels a  little bit more short of breath than previously.  A detailed review of systems today was otherwise stable   COVID 19 VACCINATION STATUS: [place] x2, with the booster 08/16/2020   HISTORY OF CURRENT ILLNESS: From the original intake note:  We have reviewed the medical records from [place], which is the source of the information below:  Mia [name] was initially diagnosed with Stage IIIA (T2N2M0) invasive ductal carcinoma, estrogen receptor positive and HER2 negative right breast cancer in 2000. She underwent right mastectomy, with 4 positive metastatic lymph nodes. She had chemotherapy with taxol and cytoxan and fulvestrant in the past. She had radiation and completed 5 years of tamoxifen.   She was diagnosed with Stage IV disease 04/18/2005 with metastases to the bone, lung, and additional nodes. She was on capecitabine but was discontinued after rising CA 27-29 and scans.   She started anastrozole and everolimus with Delton See in April 2014. She tolerated this well, but this was discontinued in July 2014 due to abnormal blood tests, hyperlipidemia and hyperglycemia.  She began Abraxane 121m /m2 and Xgeva in August 2014 weekly x3 with 1 week off. She tolerated this treatment well. She discontinued Xgeva January 2016 due to osteonecrosis of the jaw and mouth issues. She continued abraxane alone until her last dose on 11/18/2015 due to neuropathy and disease progression with restaging studies on 11/23/2015 with a CT chest abdomen and pelvis and one scan showing skull, sternal and femoral lesions.   She was switched to gemcitabine starting 01/20/2016 weekly x3 and 1 week off. This was discontinued on 06/15/2016 due to a port related jugular clot on the right side. She was given Lovenox for 3 months.  She was switched to Letrozole 2.5 mg and palbociclib 125 mg starting 07/13/2016. She tolerated this well. Restaging studies with CT chest abdomen and pelvis and bone scan on 07/01/2017  showed stable/ improving lesions. CA 27-29 was stable.  During the last few months of follow up in [place], she completed restaging studies with a CT chest abdomen and pelvis at [facility] on 12/17/2017 showing: A 6 mm pulmonary nodule in the left upper lobe laterally. Progression of metastatic disease to the bones at the L4 and L5 vertebral bodies. Sclerotic metastases at T8, T10, an T11, are similar to previous exams. Sclerotic metastases in the sternum stable. Hepatic steatosis. Aortic atherosclerosis.  Most recent CA-82-29 (January 2019) was 58.  Most recent hemoglobin A1c was 7.1 according to the patient  The patient's subsequent history is as detailed below.   PAST MEDICAL HISTORY: Past Medical History:  Diagnosis Date  . Breast cancer metastasized to bone ([place])   . Hyperlipidemia   . Hypertension   . Lymphedema of right arm   . Neuropathy associated with cancer ([place])   . Obesity (BMI 35.0-39.9 without comorbidity)      PAST SURGICAL HISTORY: Past Surgical History:  Procedure Laterality Date  . CATARACT EXTRACTION Left   . HYSTERECTOMY ABDOMINAL WITH SALPINGO-OOPHORECTOMY    . MODIFIED RADICAL MASTECTOMY Right      FAMILY HISTORY: Family History  Problem Relation Age of Onset  . Diabetes Mother   . Breast cancer Mother 15  . Cerebral aneurysm Mother   . Pulmonary embolism Father   . Colon cancer Father 57  . Breast cancer Sister 32       noninvasive  . Breast cancer Paternal Grandmother 78    The patient reports she had negative genetic testing at [facility] 2014, report not available. --The patient's father died at age 5 due to a PE, and he also had a history of colon cancer diagnosed at age 68. The patient's mother died at age 65 due to diabetes and survived a cerebral aneurysm. The patient's mother also had a history of breast cancer diagnosed at age 24.  The patient has no brothers and 65 sister. The patient's sister was diagnosed with non invasive breast  cancer at age 74. There was a paternal grandmother diagnosed with breast cancer at age 63. The patient denies a family history of ovarian cancer.    GYNECOLOGIC HISTORY:  Patient's last menstrual period was 10/01/2006. Menarche: 65 years old Age at first live birth: 65 years old She is [place] P2.  She is status post hysterectomy with bilateral salpingo- oophorectomy 12/23/2006 with benign pathology ([id number]) She never used HRT.    SOCIAL HISTORY:  Daiya is disabled due to her breast cancer. She used to be Surveyor, quantity for a cardiology office. Her husband, [name] is in the process of retiring. He is a Electrical engineer for a power company. The patient's daughter, [name], lives in [place] and works as a Teaching laboratory technician. The patient's second daughter, [name] is a stay at home mom and is a special needs teacher, who will soon move to [facility] [place]). The patient plans on attending [employer].     ADVANCED DIRECTIVES: In the absence of any documentation to the contrary, the patient's spouse is their HCPOA.    HEALTH MAINTENANCE: Social History   Tobacco Use  . Smoking status: Never Smoker  . Smokeless tobacco: Never Used  Vaping Use  . Vaping Use: Never used  Substance Use  Topics  . Alcohol use: Not Currently  . Drug use: Never    Colonoscopy: 2017   PAP:  Bone density: never  Mammogram: 2017   Allergies  Allergen Reactions  . Morphine And Related Anaphylaxis    Current Outpatient Medications  Medication Sig Dispense Refill  . ibuprofen (ADVIL) 800 MG tablet Take 1 tablet (800 mg total) by mouth every 8 (eight) hours as needed. 30 tablet 0  . oxyCODONE (OXY IR/ROXICODONE) 5 MG immediate release tablet Take 1-2 tablets (5-10 mg total) by mouth every 4 (four) hours as needed for severe pain. 60 tablet 0  . amLODipine (NORVASC) 10 MG tablet Take 10 mg by mouth daily.    Marland Kitchen aspirin 81 MG chewable tablet Chew by mouth daily.    .  fenofibrate (TRICOR) 145 MG tablet Take 1 tablet (145 mg total) by mouth daily. 90 tablet 3  . fexofenadine (ALLEGRA) 60 MG tablet Take 1 tablet (60 mg total) by mouth 2 (two) times daily.    . fluticasone (FLONASE) 50 MCG/ACT nasal spray SPRAY 2 SPRAYS INTO EACH NOSTRIL EVERY DAY 48 mL 1  . gabapentin (NEURONTIN) 300 MG capsule Take 1 capsule (300 mg total) by mouth 4 (four) times daily. Pt is only taking 2 capsules a day 180 capsule 0  . Insulin Admin Supplies MISC Inject 24 Units into the skin daily.    . insulin glargine (LANTUS SOLOSTAR) 100 UNIT/ML Solostar Pen INJECT 24 UNITS INTO THE SKIN DAILY 15 mL 1  . Insulin Pen Needle 31G X 5 MM MISC UAD for Sq inj for DM qd 100 each PRN  . losartan (COZAAR) 50 MG tablet Take 1 tablet (50 mg total) by mouth 2 (two) times daily. 180 tablet 0  . metoprolol succinate (TOPROL-XL) 25 MG 24 hr tablet TAKE 1 TABLET BY MOUTH EVERY DAY 90 tablet 1  . oxyCODONE (OXYCONTIN) 20 mg 12 hr tablet Take 1 tablet (20 mg total) by mouth every 8 (eight) hours. 90 tablet 0  . oxyCODONE-acetaminophen (PERCOCET) 10-325 MG tablet Take 1 tablet by mouth every 6 (six) hours as needed for pain. 140 tablet 0  . pantoprazole (PROTONIX) 40 MG tablet TAKE 1 TABLET BY MOUTH EVERY DAY 90 tablet 0  . pioglitazone (ACTOS) 15 MG tablet TAKE 1 TABLET BY MOUTH EVERY DAY 90 tablet 1  . pravastatin (PRAVACHOL) 20 MG tablet Take 1 tablet (20 mg total) by mouth daily. 90 tablet 3  . sitaGLIPtin-metformin (JANUMET) 50-1000 MG tablet Take 1 tablet by mouth daily with breakfast. 90 tablet 1   No current facility-administered medications for this visit.    OBJECTIVE: White woman in poorly controlled pain Vitals:   10/26/20 1229  BP: 133/73  Pulse: 85  Resp: 18  Temp: 97.9 F (36.6 C)  SpO2: 93%   Wt Readings from Last 3 Encounters:  10/26/20 214 lb 9.6 oz (97.3 kg)  09/20/20 214 lb (97.1 kg)  08/03/20 214 lb 3.2 oz (97.2 kg)   Body mass index is 36.84 kg/m.   ECOG FSMia2 -  Symptomatic, <50% confined to bed  Sclerae unicteric, EOMs intact Wearing a mask No cervical or supraclavicular adenopathy Lungs no rales or rhonchi Heart regular rate and rhythm Abd soft, nontender, positive bowel sounds MSK any motion of the right arm causes her significant pain and as a result she is trying to sit as still as possible. Neuro: nonfocal, well oriented, appropriate affect Breasts: Deferred   LAB RESULTS:  CMP   No results found  for: TOTALPROTELP, ALBUMINELP, A1GS, A2GS, BETS, BETA2SER, GAMS, MSPIKE, SPEI  No results found for: KPAFRELGTCHN, LAMBDASER, KAPLAMBRATIO  Lab Results  Component Value Date   WBC 4.1 10/26/2020   NEUTROABS 2.8 10/26/2020   HGB 9.1 (L) 10/26/2020   HCT 28.2 (L) 10/26/2020   MCV 109.3 (H) 10/26/2020   PLT 324 10/26/2020    No results found for: LABCA2  No components found for: [id number]  No results for input(s): INR in the last 168 hours.  No results found for: LABCA2  No results found for: KKD594  No results found for: LMR615  No results found for: HID437  Lab Results  Component Value Date   CA2729 254.2 (H) 10/18/2020    No components found for: HGQUANT  No results found for: CEA1 / No results found for: CEA1   No results found for: AFPTUMOR  No results found for: CHROMOGRNA  No results found for: HGBA, HGBA2QUANT, HGBFQUANT, HGBSQUAN (Hemoglobinopathy evaluation)   No results found for: LDH  No results found for: IRON, TIBC, IRONPCTSAT (Iron and TIBC)  No results found for: FERRITIN  Urinalysis No results found for: COLORURINE, APPEARANCEUR, LABSPEC, PHURINE, GLUCOSEU, HGBUR, BILIRUBINUR, KETONESUR, PROTEINUR, UROBILINOGEN, NITRITE, LEUKOCYTESUR   STUDIES: Refuses mammography at this point (12/23/2019)   ELIGIBLE FOR AVAILABLE RESEARCH PROTOCOL: no   ASSESSMENT: 65 y.o. [name], [place] woman with stage IV breast cancer, as follows:  (1) status post right mastectomy in 2000, for a pT2 pN2, stage  IIIA invasive ductal carcinoma, estrogen receptor positive, progesterone receptor not tested, HER-2 negative (0) by immunohistochemistry  (a) adjuvant chemotherapy with doxorubicin and cyclophosphamide in dose dense fashion x4 followed by paclitaxel in dose dense fashion x4  (b) adjuvant radiation: 30 doses  (c) antiestrogens: Tamoxifen for 5 years, completed 2005  (2) METASTASTIC DISEASE: 2008, involving bones, lungs, and lymph nodes  (a) CA 27-29 is informative  (b) CT of the chest abdomen and pelvis in [facility] finds stable sclerotic metastases (T8, T10, T11, sternum, L4, L5); 0.6 cm left upper lobe lung nodule stable  (3)  prior anti-estrogen treatments:  (a) fulvestrant--progression  (b) exemestane/everolimus--hyperlipidemia, hyperglycemia  (4) prior chemotherapy treatments:  (a) capecitabine: progression  (b) Abraxane, August 2014 through 11/18/2015: good response but stopped due to neuropathy  (c) gemcitabine 01/20/2016--?; multiple interruptions secondary to infections  (5) radiation therapy:  (a) T spine and Right femur, completed 01/10/2016  (6) letrozole/ palbociclib started 07/13/2016, discontinued 10/26/2020 with evidence of progression  (7) bone treatment:  (a) denosumab/Xgeva--discontinued January 2016 due to osteonecrosis of the jaw  (8) cancer associated pain:  (a) oxycodone/APAP 10/325 QID as needed  (b) PMP Aware reviewed 05/11/2020  (c) OxyContin 20 mg p.o. twice daily added 09/29/2019  (d) bowel prophylaxis: MiraLAX as needed  (9) restaging studies:  (a) mostly stable bone lesions with evidence of progression at L3-L5; no epidural tumor by total spinal MRI in November 2019  (b) no evidence of progressive lung, lymph node or liver lesions by CT scans of the chest abdomen and pelvis and bone scan December 2019  (c) CT scans abd/pelvis 03/31/2019 shows stable bone metastases, no extraosseous disease  (d) MRI pelvis 04/09/2019 shows bilateral ilium,  left acetabulum and lumbar mets, stable  (e) CT abd/pelvis 09/01/2019 shows stable bone mets, slight increase left effusion  (f) CT of the chest 05/05/2020 again shows minimal increase in the left pleural effusion, possible resorption of bone around the left glenoid lesion, otherwise stable  (g) CT of the chest abdomen and  pelvis 10/18/2020 again shows no evidence of visceral disease, but there is apparent progression in the bone disease, and some enlargement of the left pleural effusion.  The CA 27-29 has also been rising.  Accordingly her letrozole and palbociclib are being discontinued  (10) palliative radiation 11/18/2018 through 12/02/2018 Site/dose:   1. Lumbar spine; 35 Gy in 14 fractions of 2.5 Gy                      2. Pelvis; 35 Gy in 14 fractions of 2.5 Gy  (11) to start cyclophosphamide, methotrexate and fluorouracil [CMF] 11/10/2020  (a) methotrexate to be held during palliative radiation   PLAN: Delonna is experiencing uncontrolled pain at present.  We upped her OxyContin to 20 mg 3 times a day and I wrote her for OxyIR to take 5 or 10 mg every 4 hours as needed.  She is very aware of the need to have regular bowel movements which are soft and she is good at bowel prophylaxis issues.  In addition she is taking Motrin and gabapentin which we upped to 300 mg 4 times a day  She had significant benefit from the radiation to her left hip.  I am hoping we can get the same results for radiation to the left shoulder area.  We did review the CT films today so she could see the tumor there.  I wrote her for a sling to keep her right arm in to decrease mobility which also will help with the pain.  I have asked Dr. Sondra Come if he could see the patient for palliative radiation at this point.  Given the findings on the recent scans and the rise in the CA 27-29 I do think we have evidence for disease progression.  She has been on letrozole and palbociclib for 4 years so that is a good run of  those medications.  I suggested we go with CMF and she has a good understanding of the possible toxicities side effects and complications of that agent.  Her husband is going to be out of town next week so she does not want to start before 11/10/2020 and I will set her up for that.  I am not giving her methotrexate with the first cycle in case she is receiving palliative radiation to the shoulder concurrently  She will see me again with cycle 2 and we will troubleshoot side effects at that time.  Encounter time 40 minutes.Sarajane Jews C. Keatyn Jawad, MD 10/26/20 1Mia29 PM Medical Oncology and Hematology [facility] [place], [place] [address] Tel. [phone]    Fax. [phone]   I, Wilburn Mylar, am acting as scribe for Dr. Virgie Dad. Aeisha Minarik.  I, Lurline Del MD, have reviewed the above documentation for accuracy and completeness, and I agree with the above.   *Total Encounter Time as defined by the Centers for Medicare and Medicaid Services includes, in addition to the face-to-face time of a patient visit (documented in the note above) non-face-to-face time: obtaining and reviewing outside history, ordering and reviewing medications, tests or procedures, care coordination (communications with other health care professionals or caregivers) and documentation in the medical record.

## 2020-10-26 NOTE — Progress Notes (Signed)
START OFF PATHWAY REGIMEN - Breast   [id number]:CMF (IV cyclophosphamide) q21 days:   A cycle is every 21 days:     Cyclophosphamide      Methotrexate      Fluorouracil   **Always confirm dose/schedule in your pharmacy ordering system**  Patient Characteristics: Distant Metastases or Locoregional Recurrent Disease - Unresected or Locally Advanced Unresectable Disease Progressing after Neoadjuvant and Local Therapies, HER2 Negative/Unknown/Equivocal, ER Positive, Chemotherapy, Third Line and Beyond, Prior or  Contraindicated Anthracycline and Prior Eribulin Therapeutic Status: Distant Metastases ER Status: Positive (+) HER2 Status: Negative (-) PR Status: Positive (+) Therapy Approach Indicated: Standard Chemotherapy/Endocrine Therapy Line of Therapy: Third Line and Beyond Intent of Therapy: Non-Curative / Palliative Intent, Discussed with Patient

## 2020-10-31 ENCOUNTER — Telehealth: Payer: Self-pay | Admitting: Oncology

## 2020-10-31 NOTE — Progress Notes (Signed)
Histology and Location of Primary Cancer: Right Breast Cancer metastasized to bones  Sites of Visceral and Bony Metastatic Disease:  Musculoskeletal: Numerous mixed lytic and sclerotic lesions are again noted throughout the visualized axial and appendicular skeleton, increased in number and size compared to the prior examination. This includes some pathologic compression fractures at L4 and L5 which are similar to the prior examination, most severe at L5 where there is 60% loss of central vertebral body height. New comminuted pathologic right acetabular fracture. Healing pathologic fracture of the right ilium.  IMPRESSION: 1. Slight progression of metastatic disease to the bones, most evident in the pelvis where there is a new healing pathologic fracture of the right ilium, as well as a comminuted pathologic fracture of the right acetabulum.  Location(s) of Symptomatic Metastases: Right Shoulder  Past/Anticipated chemotherapy by medical oncology, if any:   Taxol/Cytoxan/fulvestrant Tamoxifen Capecitabine Anastrozole/everolimus/xgeva Abraxane/xgeva Gemcitabine Letrozole/palbociclib (off treatment) CMF to start 11/11/2020  Pain on a scale of 0-10 is: 3 when resting and 10 with movement to the right shoulder.   If Spine Met(s), symptoms, if any, include:  Bowel/Bladder retention or incontinence (please describe): no  Numbness or weakness in extremities (please describe): right hand tingling for several years.  Limited in movement of the right shoulder.  Presently in a sling and unable to raise to 90 degree angle.  Current Decadron regimen, if applicable: no  Ambulatory status? Walker? Wheelchair?: Ambulatory with Assistance with cane  SAFETY ISSUES:  Prior radiation? yes  Pacemaker/ICD? no  Possible current pregnancy? no  Is the patient on methotrexate? no  Current Complaints / other details:  Patient was previously radiated here at [facility].  Patient was referred back  by Dr. [name].  Patient lives with husband.  Vitals:   11/01/20 1232  BP: 126/70  Pulse: 85  Resp: 20  Temp: (!) 96.9 F (36.1 C)  TempSrc: Temporal  SpO2: 100%  Weight: 212 lb (96.2 kg)  Height: '5\' 4"'  (1.626 m)   Rhae Hammock, RN BSN

## 2020-10-31 NOTE — Telephone Encounter (Signed)
Informed patient of her upcoming appointment. Patient is aware but requested more information regarding the reason she is seeing the provider again.

## 2020-11-01 ENCOUNTER — Encounter: Payer: Self-pay | Admitting: Radiation Oncology

## 2020-11-01 ENCOUNTER — Other Ambulatory Visit: Payer: Self-pay

## 2020-11-01 ENCOUNTER — Ambulatory Visit
Admission: RE | Admit: 2020-11-01 | Discharge: 2020-11-01 | Disposition: A | Discharge status: Home / Self Care | Payer: MEDICARE | Source: Ambulatory Visit | Attending: Radiation Oncology | Admitting: Radiation Oncology

## 2020-11-01 ENCOUNTER — Other Ambulatory Visit: Payer: Self-pay | Admitting: Oncology

## 2020-11-01 ENCOUNTER — Other Ambulatory Visit: Payer: Self-pay | Admitting: Family Medicine

## 2020-11-01 DIAGNOSIS — C50911 Malignant neoplasm of unspecified site of right female breast: Secondary | ICD-10-CM | POA: Insufficient documentation

## 2020-11-01 DIAGNOSIS — I700 Atherosclerosis of aorta: Secondary | ICD-10-CM | POA: Diagnosis not present

## 2020-11-01 DIAGNOSIS — Z923 Personal history of irradiation: Secondary | ICD-10-CM | POA: Diagnosis not present

## 2020-11-01 DIAGNOSIS — Z79811 Long term (current) use of aromatase inhibitors: Secondary | ICD-10-CM | POA: Diagnosis not present

## 2020-11-01 DIAGNOSIS — Z8781 Personal history of (healed) traumatic fracture: Secondary | ICD-10-CM | POA: Insufficient documentation

## 2020-11-01 DIAGNOSIS — C7951 Secondary malignant neoplasm of bone: Secondary | ICD-10-CM | POA: Insufficient documentation

## 2020-11-01 DIAGNOSIS — Z170 Estrogen receptor positive status [ER+]: Secondary | ICD-10-CM | POA: Insufficient documentation

## 2020-11-01 DIAGNOSIS — J90 Pleural effusion, not elsewhere classified: Secondary | ICD-10-CM | POA: Diagnosis not present

## 2020-11-01 DIAGNOSIS — C7800 Secondary malignant neoplasm of unspecified lung: Secondary | ICD-10-CM | POA: Insufficient documentation

## 2020-11-01 DIAGNOSIS — Z79899 Other long term (current) drug therapy: Secondary | ICD-10-CM | POA: Diagnosis not present

## 2020-11-01 DIAGNOSIS — M25519 Pain in unspecified shoulder: Secondary | ICD-10-CM | POA: Insufficient documentation

## 2020-11-01 DIAGNOSIS — I2510 Atherosclerotic heart disease of native coronary artery without angina pectoris: Secondary | ICD-10-CM | POA: Insufficient documentation

## 2020-11-01 DIAGNOSIS — G893 Neoplasm related pain (acute) (chronic): Secondary | ICD-10-CM | POA: Diagnosis not present

## 2020-11-01 DIAGNOSIS — Z791 Long term (current) use of non-steroidal anti-inflammatories (NSAID): Secondary | ICD-10-CM | POA: Insufficient documentation

## 2020-11-01 DIAGNOSIS — Z9221 Personal history of antineoplastic chemotherapy: Secondary | ICD-10-CM | POA: Insufficient documentation

## 2020-11-01 DIAGNOSIS — Z7982 Long term (current) use of aspirin: Secondary | ICD-10-CM | POA: Insufficient documentation

## 2020-11-01 DIAGNOSIS — Z853 Personal history of malignant neoplasm of breast: Secondary | ICD-10-CM | POA: Diagnosis not present

## 2020-11-01 HISTORY — DX: Type 2 diabetes mellitus without complications: E11.9

## 2020-11-01 NOTE — Progress Notes (Signed)
See md visit for nursing evaluation

## 2020-11-01 NOTE — Progress Notes (Addendum)
Radiation Oncology         [phone] ________________________________  Name: Mia [name] MRN: [id number]  Date: 11/01/2020  DOB: July 10, 1956  Re-Evaluation Note  CC: Ronnald Nian, DO  Magrinat, Virgie Dad, MD    ICD-10-CM   1. Bone metastases (HCC)  C79.51     Diagnosis: Metastatic breast cancer to bone     Narrative:  The patient returns today to discuss radiation treatment options. She was initially seen by me on 10/02/2018 for metastatic breast cancer to the bone. She was treated with palliative radiation therapy directed at the lumbar spine (35 Gy in 14 fractions of 2.5 Gy) and pelvis (35 Gy in 14 fractions of 2.5 Gy) from 11/18/2020 - 12/02/2018. She tolerated radiation treatment relatively well with the exception diarrhea. She did report improvement in pain overall.  The patient also reports having radiation therapy to the right chest wall area in the distant past with her initial presentation.  She reports this was only directed at the right chest wall area.  She denies any previous treatment to the axillary area.  Since her last visit, she has been under the closer care of Dr. [name]. She underwent serial imaging, which showed relatively stable osseous metastatic disease. Most recent imaging results are as follows: 1. CT scan of chest on 05/05/2020 that showed a slight interval increase in a small to moderate left pleural effusion with associated atelectasis or consolidation of uncertain significance. The effusion was presumed malignant, although there was no overt CT evidence of pleural mass or nodularity. Otherwise, post-treatment appearance was unchanged with dense consolidation and volume loss of the inferior left lower lobe. The irregular 5 mm pulmonary nodule of the peripheral left upper lobe was stable and presumed incidental and benign. Additionally, there was an increasingly lytic and permeative appearance of a sclerotic osseous lesion of the left glenoid of  uncertain significance, possibly resorption following treatment. There was no significant interval change in multiple additional sclerotic osseous lesions, including the T8, T10, and T11 vertebral bodies, multiple bilateral ribs, and the sternal body. 2. MRI of pelvis on 06/08/2020 that showed widespread active metastatic disease in the bony pelvis, right proximal femur, and at L4 and L5. There was also noted to be a suspected pathologic fracture through the right acetabulum associated with the permeative bony lesion with extensive asymmetric edema in the adjacent iliacus and gluteal musculature.  3. CT scan of chest, abdomen, and pelvis on 10/18/2020 that showed slight progression of metastatic disease to the bones, most evident in the pelvis where there was a new healing pathologic fracture of the right ilium, as well as a comminuted pathologic fracture of the right acetabulum. There was no definitive evidence of extraskeletal metastatic disease confidently identified on the exam. However, there were multiple tiny 1-3 mm pulmonary nodules scattered throughout the lung bilaterally, which were stable when compared to prior examinations and favored to be benign. Additionally, there was slight enlargement of the large left pleural effusion with extensive passive subsegmental atelectasis in the left lung. There were no definitive areas of pleural thickening or enhancement identified to clearly indicate a malignant pleural effusion.   The patient was last seen by Dr. [name] on 10/26/2020, during which time she was noted to be having uncontrolled pain. Given that she had improvement in pain during last radiation treatment, it was recommended that she undergo radiation therapy to her right arm/shoulder for pain control. She has been on Letrozole and Palbociclib for four years. However, they discussed  changing to CMF, which is scheduled to begin on 11/10/2020.  Dr. [name] plans to hold methotrexate during the  patient's radiation therapy.  On review of systems, the patient reports pain in the upper right arm and shoulder area.  This severely limits her mobility of her right arm.  She currently uses a sling to help with the discomfort. She denies weakness in her right hand grip does report some numbness and tingling related to prior chemotherapy.  She reports no other significant areas of pain at this time   Allergies:  is allergic to morphine and related.  Meds: Current Outpatient Medications  Medication Sig Dispense Refill  . amLODipine (NORVASC) 10 MG tablet Take 10 mg by mouth daily.    Marland Kitchen aspirin 81 MG chewable tablet Chew by mouth daily.    . fenofibrate (TRICOR) 145 MG tablet Take 1 tablet (145 mg total) by mouth daily. 90 tablet 3  . fluticasone (FLONASE) 50 MCG/ACT nasal spray SPRAY 2 SPRAYS INTO EACH NOSTRIL EVERY DAY 48 mL 1  . gabapentin (NEURONTIN) 300 MG capsule Take 1 capsule (300 mg total) by mouth 4 (four) times daily. Pt is only taking 2 capsules a day 180 capsule 0  . ibuprofen (ADVIL) 800 MG tablet Take 1 tablet (800 mg total) by mouth every 8 (eight) hours as needed. 30 tablet 0  . Insulin Admin Supplies MISC Inject 24 Units into the skin daily.    . insulin glargine (LANTUS SOLOSTAR) 100 UNIT/ML Solostar Pen INJECT 24 UNITS INTO THE SKIN DAILY 15 mL 1  . Insulin Pen Needle 31G X 5 MM MISC UAD for Sq inj for DM qd 100 each PRN  . losartan (COZAAR) 50 MG tablet Take 1 tablet (50 mg total) by mouth 2 (two) times daily. 180 tablet 0  . metoprolol succinate (TOPROL-XL) 25 MG 24 hr tablet TAKE 1 TABLET BY MOUTH EVERY DAY 90 tablet 1  . oxyCODONE (OXY IR/ROXICODONE) 5 MG immediate release tablet Take 1-2 tablets (5-10 mg total) by mouth every 4 (four) hours as needed for severe pain. 60 tablet 0  . oxyCODONE (OXYCONTIN) 20 mg 12 hr tablet Take 1 tablet (20 mg total) by mouth every 8 (eight) hours. 90 tablet 0  . pantoprazole (PROTONIX) 40 MG tablet TAKE 1 TABLET BY MOUTH EVERY DAY 90  tablet 0  . pioglitazone (ACTOS) 15 MG tablet TAKE 1 TABLET BY MOUTH EVERY DAY 90 tablet 1  . pravastatin (PRAVACHOL) 20 MG tablet Take 1 tablet (20 mg total) by mouth daily. 90 tablet 3  . prochlorperazine (COMPAZINE) 10 MG tablet Take 1 tablet (10 mg total) by mouth every 6 (six) hours as needed (Nausea or vomiting). 30 tablet 1  . sitaGLIPtin-metformin (JANUMET) 50-1000 MG tablet Take 1 tablet by mouth daily with breakfast. 90 tablet 1   No current facility-administered medications for this encounter.    Physical Findings:  height is 5\' 4"  (1.626 m) and weight is 212 lb (96.2 kg). Her temporal temperature is 96.9 F (36.1 C) (abnormal). Her blood pressure is 126/70 and her pulse is 85. Her respiration is 20 and oxygen saturation is 100%.   General: Alert and oriented, in no acute distress.  Remains in wheelchair for evaluation.  Right arm in a sling HEENT: Head is normocephalic. Extraocular movements are intact. PERRL.  Neck: Neck is supple, no palpable cervical or supraclavicular lymphadenopathy. Heart: Regular in rate and rhythm with no murmurs, rubs, or gallops. Chest: Clear to auscultation bilaterally, with no rhonchi, wheezes,  or rales. Abdomen: Soft, nontender, nondistended, with no rigidity or guarding. Extremities: patient has significant lymphedema in her right arm and hand area.  Musculoskeletal: Grip strength slightly decreased in the right hand compared to the left Psychiatric: Judgment and insight are intact. Affect is appropriate.   Lab Findings: Lab Results  Component Value Date   WBC 4.1 10/26/2020   HGB 9.1 (L) 10/26/2020   HCT 28.2 (L) 10/26/2020   MCV 109.3 (H) 10/26/2020   PLT 324 10/26/2020    Radiographic Findings: CT Chest W Contrast  Result Date: 10/18/2020 CLINICAL DATA:  65 year old female with history of right-sided breast cancer with metastatic disease to the lungs and bones. Chemotherapy completed in 2018. Radiation therapy completed 2000, 2016 and  2020. Oral chemotherapy ongoing. Shortness of breath on exertion. Follow-up study. EXAM: CT CHEST, ABDOMEN, AND PELVIS WITH CONTRAST TECHNIQUE: Multidetector CT imaging of the chest, abdomen and pelvis was performed following the standard protocol during bolus administration of intravenous contrast. CONTRAST:  31mL OMNIPAQUE IOHEXOL 300 MG/ML  SOLN COMPARISON:  Chest CT 05/05/2020. CT the chest, abdomen and pelvis 09/01/2019. FINDINGS: CT CHEST FINDINGS Cardiovascular: Heart size is normal. There is no significant pericardial fluid, thickening or pericardial calcification. There is aortic atherosclerosis, as well as atherosclerosis of the great vessels of the mediastinum and the coronary arteries, including calcified atherosclerotic plaque in the left main and left anterior descending coronary arteries. Mediastinum/Nodes: No pathologically enlarged mediastinal or hilar lymph nodes. Esophagus is unremarkable in appearance. No axillary lymphadenopathy. Numerous surgical clips in the right axilla from prior lymph node dissection. Lungs/Pleura: Large left pleural effusion, increased compared to the prior examination. This is associated with worsening subsegmental atelectasis in the inferior segment of the lingula and in the left lower lobe. Multiple tiny 1-3 mm pulmonary nodules scattered throughout the lungs bilaterally, similar in size and number to the prior study. No other larger more suspicious appearing pulmonary nodules or masses are noted. No acute consolidative airspace disease. Musculoskeletal: Numerous mixed lytic and sclerotic lesions are again noted throughout the visualized axial and appendicular skeleton, similar to the prior study, compatible with widespread metastatic disease to the bones. Healing pathologic fracture left scapula, similar to the prior study. Status post right modified radical mastectomy and right axillary lymph node dissection. CT ABDOMEN PELVIS FINDINGS Hepatobiliary: No suspicious  cystic or solid hepatic lesions. No intra or extrahepatic biliary ductal dilatation. Gallbladder is normal in appearance. Pancreas: No pancreatic mass. No pancreatic ductal dilatation. No pancreatic or peripancreatic fluid collections or inflammatory changes. Spleen: Unremarkable. Adrenals/Urinary Tract: Bilateral kidneys and bilateral adrenal glands are normal in appearance. No hydroureteronephrosis. Urinary bladder is normal in appearance. Stomach/Bowel: The appearance of the stomach is normal. No pathologic dilatation of small bowel or colon. Normal appendix. Vascular/Lymphatic: Aortic atherosclerosis, without evidence of aneurysm or dissection in the abdominal or pelvic vasculature. No lymphadenopathy noted in the abdomen or pelvis. Reproductive: Status post hysterectomy. Ovaries are not confidently identified may be surgically absent or atrophic. Other: No significant volume of ascites.  No pneumoperitoneum. Musculoskeletal: Numerous mixed lytic and sclerotic lesions are again noted throughout the visualized axial and appendicular skeleton, increased in number and size compared to the prior examination. This includes some pathologic compression fractures at L4 and L5 which are similar to the prior examination, most severe at L5 where there is 60% loss of central vertebral body height. New comminuted pathologic right acetabular fracture. Healing pathologic fracture of the right ilium. IMPRESSION: 1. Slight progression of metastatic disease to the bones,  most evident in the pelvis where there is a new healing pathologic fracture of the right ilium, as well as a comminuted pathologic fracture of the right acetabulum. 2. No definitive evidence of extraskeletal metastatic disease confidently identified on today's examination. 3. Multiple tiny 1-3 mm pulmonary nodules scattered throughout the lungs bilaterally, stable compared to prior examinations, favored to be benign. Continued attention on follow-up studies is  recommended. 4. Slight enlargement of large left pleural effusion with extensive passive subsegmental atelectasis in the left lung. No definitive areas of pleural thickening or enhancement are identified to clearly indicate a malignant pleural effusion, however, fluid sampling may be warranted to exclude the possibility of a malignant left effusion. 5. Aortic atherosclerosis, in addition to left main and left anterior descending coronary artery disease. Please note that although the presence of coronary artery calcium documents the presence of coronary artery disease, the severity of this disease and any potential stenosis cannot be assessed on this non-gated CT examination. Assessment for potential risk factor modification, dietary therapy or pharmacologic therapy may be warranted, if clinically indicated. 6. Additional incidental findings, as above. Electronically Signed   By: Vinnie Langton M.D.   On: 10/18/2020 11:42   CT Abdomen Pelvis W Contrast  Result Date: 10/18/2020 CLINICAL DATA:  65 year old female with history of right-sided breast cancer with metastatic disease to the lungs and bones. Chemotherapy completed in 2018. Radiation therapy completed 2000, 2016 and 2020. Oral chemotherapy ongoing. Shortness of breath on exertion. Follow-up study. EXAM: CT CHEST, ABDOMEN, AND PELVIS WITH CONTRAST TECHNIQUE: Multidetector CT imaging of the chest, abdomen and pelvis was performed following the standard protocol during bolus administration of intravenous contrast. CONTRAST:  44mL OMNIPAQUE IOHEXOL 300 MG/ML  SOLN COMPARISON:  Chest CT 05/05/2020. CT the chest, abdomen and pelvis 09/01/2019. FINDINGS: CT CHEST FINDINGS Cardiovascular: Heart size is normal. There is no significant pericardial fluid, thickening or pericardial calcification. There is aortic atherosclerosis, as well as atherosclerosis of the great vessels of the mediastinum and the coronary arteries, including calcified atherosclerotic plaque in  the left main and left anterior descending coronary arteries. Mediastinum/Nodes: No pathologically enlarged mediastinal or hilar lymph nodes. Esophagus is unremarkable in appearance. No axillary lymphadenopathy. Numerous surgical clips in the right axilla from prior lymph node dissection. Lungs/Pleura: Large left pleural effusion, increased compared to the prior examination. This is associated with worsening subsegmental atelectasis in the inferior segment of the lingula and in the left lower lobe. Multiple tiny 1-3 mm pulmonary nodules scattered throughout the lungs bilaterally, similar in size and number to the prior study. No other larger more suspicious appearing pulmonary nodules or masses are noted. No acute consolidative airspace disease. Musculoskeletal: Numerous mixed lytic and sclerotic lesions are again noted throughout the visualized axial and appendicular skeleton, similar to the prior study, compatible with widespread metastatic disease to the bones. Healing pathologic fracture left scapula, similar to the prior study. Status post right modified radical mastectomy and right axillary lymph node dissection. CT ABDOMEN PELVIS FINDINGS Hepatobiliary: No suspicious cystic or solid hepatic lesions. No intra or extrahepatic biliary ductal dilatation. Gallbladder is normal in appearance. Pancreas: No pancreatic mass. No pancreatic ductal dilatation. No pancreatic or peripancreatic fluid collections or inflammatory changes. Spleen: Unremarkable. Adrenals/Urinary Tract: Bilateral kidneys and bilateral adrenal glands are normal in appearance. No hydroureteronephrosis. Urinary bladder is normal in appearance. Stomach/Bowel: The appearance of the stomach is normal. No pathologic dilatation of small bowel or colon. Normal appendix. Vascular/Lymphatic: Aortic atherosclerosis, without evidence of aneurysm or dissection in  the abdominal or pelvic vasculature. No lymphadenopathy noted in the abdomen or pelvis.  Reproductive: Status post hysterectomy. Ovaries are not confidently identified may be surgically absent or atrophic. Other: No significant volume of ascites.  No pneumoperitoneum. Musculoskeletal: Numerous mixed lytic and sclerotic lesions are again noted throughout the visualized axial and appendicular skeleton, increased in number and size compared to the prior examination. This includes some pathologic compression fractures at L4 and L5 which are similar to the prior examination, most severe at L5 where there is 60% loss of central vertebral body height. New comminuted pathologic right acetabular fracture. Healing pathologic fracture of the right ilium. IMPRESSION: 1. Slight progression of metastatic disease to the bones, most evident in the pelvis where there is a new healing pathologic fracture of the right ilium, as well as a comminuted pathologic fracture of the right acetabulum. 2. No definitive evidence of extraskeletal metastatic disease confidently identified on today's examination. 3. Multiple tiny 1-3 mm pulmonary nodules scattered throughout the lungs bilaterally, stable compared to prior examinations, favored to be benign. Continued attention on follow-up studies is recommended. 4. Slight enlargement of large left pleural effusion with extensive passive subsegmental atelectasis in the left lung. No definitive areas of pleural thickening or enhancement are identified to clearly indicate a malignant pleural effusion, however, fluid sampling may be warranted to exclude the possibility of a malignant left effusion. 5. Aortic atherosclerosis, in addition to left main and left anterior descending coronary artery disease. Please note that although the presence of coronary artery calcium documents the presence of coronary artery disease, the severity of this disease and any potential stenosis cannot be assessed on this non-gated CT examination. Assessment for potential risk factor modification, dietary  therapy or pharmacologic therapy may be warranted, if clinically indicated. 6. Additional incidental findings, as above. Electronically Signed   By: Vinnie Langton M.D.   On: 10/18/2020 11:42    Impression: Metastatic breast cancer to bone  The patient is having significant pain in the right upper arm shoulder area.  On recent chest CT scan limited views of the right upper arm are noted in the does appear to be significant mixed/sclerotic lesions in this area.  She does report undergoing plain x-ray at her pain management office but these images are not available at this time.  Patient will be a good candidate for short course of palliative radiation therapy directed at the right upper humerus and shoulder region.  The patient will undergo CT planning to further evaluate the area of concern.  I discussed with the patient that sometimes we will consider orthopedic stabilization if the bone is quite weakened however she would only want to consider this as a last resort and would prefer to proceed with radiation therapy first, reserving surgery if she were to develop a  pathologic fracture.  Plan:  Patient is scheduled for CT simulation February 2 with treatments to begin on February 7.  Anticipate 10 radiation treatments.   Total time spent in this encounter was 35 minutes which included reviewing the patient's most recent CT scans, MRIs, follow-ups, physical examination, and documentation.  -----------------------------------  Blair Promise, PhD, MD  This document serves as a record of services personally performed by [name], MD. It was created on his behalf by Clerance Lav, a trained medical scribe. The creation of this record is based on the scribe's personal observations and the provider's statements to them. This document has been checked and approved by the attending provider.

## 2020-11-02 ENCOUNTER — Other Ambulatory Visit: Payer: Self-pay

## 2020-11-02 ENCOUNTER — Ambulatory Visit
Admission: RE | Admit: 2020-11-02 | Discharge: 2020-11-02 | Disposition: A | Discharge status: Home / Self Care | Payer: MEDICARE | Source: Ambulatory Visit | Attending: Radiation Oncology | Admitting: Radiation Oncology

## 2020-11-02 DIAGNOSIS — Z5111 Encounter for antineoplastic chemotherapy: Secondary | ICD-10-CM | POA: Insufficient documentation

## 2020-11-02 DIAGNOSIS — C779 Secondary and unspecified malignant neoplasm of lymph node, unspecified: Secondary | ICD-10-CM | POA: Insufficient documentation

## 2020-11-02 DIAGNOSIS — Z170 Estrogen receptor positive status [ER+]: Secondary | ICD-10-CM | POA: Diagnosis not present

## 2020-11-02 DIAGNOSIS — C50911 Malignant neoplasm of unspecified site of right female breast: Secondary | ICD-10-CM | POA: Diagnosis not present

## 2020-11-02 DIAGNOSIS — E875 Hyperkalemia: Secondary | ICD-10-CM | POA: Diagnosis not present

## 2020-11-02 DIAGNOSIS — Z510 Encounter for antineoplastic radiation therapy: Secondary | ICD-10-CM | POA: Diagnosis not present

## 2020-11-02 DIAGNOSIS — Z853 Personal history of malignant neoplasm of breast: Secondary | ICD-10-CM | POA: Diagnosis not present

## 2020-11-02 DIAGNOSIS — C7802 Secondary malignant neoplasm of left lung: Secondary | ICD-10-CM | POA: Diagnosis not present

## 2020-11-02 DIAGNOSIS — C7951 Secondary malignant neoplasm of bone: Secondary | ICD-10-CM | POA: Diagnosis not present

## 2020-11-02 DIAGNOSIS — G893 Neoplasm related pain (acute) (chronic): Secondary | ICD-10-CM | POA: Diagnosis not present

## 2020-11-02 DIAGNOSIS — C7800 Secondary malignant neoplasm of unspecified lung: Secondary | ICD-10-CM | POA: Insufficient documentation

## 2020-11-02 DIAGNOSIS — Z20822 Contact with and (suspected) exposure to covid-19: Secondary | ICD-10-CM | POA: Insufficient documentation

## 2020-11-02 NOTE — Telephone Encounter (Signed)
Patient is scheduled for 02/04.

## 2020-11-02 NOTE — Telephone Encounter (Signed)
I have not seen this patient sine 01/2020. She is overdue for A1C and needs lipid panel

## 2020-11-04 ENCOUNTER — Ambulatory Visit (INDEPENDENT_AMBULATORY_CARE_PROVIDER_SITE_OTHER): Payer: MEDICARE | Admitting: Family Medicine

## 2020-11-04 ENCOUNTER — Other Ambulatory Visit: Payer: Self-pay

## 2020-11-04 ENCOUNTER — Encounter: Payer: Self-pay | Admitting: Family Medicine

## 2020-11-04 VITALS — BP 118/72 | HR 87 | Temp 98.4°F | Ht 64.0 in | Wt 215.8 lb

## 2020-11-04 DIAGNOSIS — E669 Obesity, unspecified: Secondary | ICD-10-CM

## 2020-11-04 DIAGNOSIS — E785 Hyperlipidemia, unspecified: Secondary | ICD-10-CM | POA: Diagnosis not present

## 2020-11-04 DIAGNOSIS — I10 Essential (primary) hypertension: Secondary | ICD-10-CM

## 2020-11-04 DIAGNOSIS — E1169 Type 2 diabetes mellitus with other specified complication: Secondary | ICD-10-CM | POA: Diagnosis not present

## 2020-11-04 LAB — POCT GLYCOSYLATED HEMOGLOBIN (HGB A1C): Hemoglobin A1C: 6 % — AB (ref 4.0–5.6)

## 2020-11-04 NOTE — Progress Notes (Signed)
Pharmacist Chemotherapy Monitoring - Initial Assessment    Anticipated start date: 11/11/20   Regimen:  . Are orders appropriate based on the patient's diagnosis, regimen, and cycle? Yes . Does the plan date match the patient's scheduled date? Yes . Is the sequencing of drugs appropriate? Yes . Are the premedications appropriate for the patient's regimen? Yes . Prior Authorization for treatment is: Pending o If applicable, is the correct biosimilar selected based on the patient's insurance? not applicable  Organ Function and Labs: Marland Kitchen Are dose adjustments needed based on the patient's renal function, hepatic function, or hematologic function? Yes . Are appropriate labs ordered prior to the start of patient's treatment? Yes . Other organ system assessment, if indicated: N/A . The following baseline labs, if indicated, have been ordered: N/A  Dose Assessment: . Are the drug doses appropriate? Yes . Are the following correct: o Drug concentrations Yes o IV fluid compatible with drug Yes o Administration routes Yes o Timing of therapy Yes . If applicable, does the patient have documented access for treatment and/or plans for port-a-cath placement? not applicable . If applicable, have lifetime cumulative doses been properly documented and assessed? not applicable Lifetime Dose Tracking  No doses have been documented on this patient for the following tracked chemicals: Doxorubicin, Epirubicin, Idarubicin, Daunorubicin, Mitoxantrone, Bleomycin, Oxaliplatin, Carboplatin, Liposomal Doxorubicin  o   Toxicity Monitoring/Prevention: . The patient has the following take home antiemetics prescribed: Ondansetron and Prochlorperazine . The patient has the following take home medications prescribed: N/A . Medication allergies and previous infusion related reactions, if applicable, have been reviewed and addressed. Yes . The patient's current medication list has been assessed for drug-drug interactions  with their chemotherapy regimen. no significant drug-drug interactions were identified on review.  Order Review: . Are the treatment plan orders signed? Yes . Is the patient scheduled to see a provider prior to their treatment? Yes  I verify that I have reviewed each item in the above checklist and answered each question accordingly.  @RPHNAME @

## 2020-11-04 NOTE — Progress Notes (Signed)
Chief Complaint  Patient presents with  . Follow-up    6 month F/u DM and labs. Discuss getting flu shot due to starting chemo next week.       HPI: *Mia [name] is a 65 y.o. female here for DM, HTN, HLD follow-up. For DM, pt is taking janumet 50-1000mg  daily, actos 15mg  daily, lantus 24 units daily. For HTN, pt is taking losartan 50mg  BID, norvasc 10mg  daily, toprol XL 50mg  daily. For HLD, pt is taking pravastatin 20mg  daily.  She would like flu vaccine today if I think it is ok/good for her to do.   Pt is starting chemo and radiation next week for her bone mets. Follows with Dr. [name] at [facility].  Lab Results  Component Value Date   HGBA1C 6.1 06/18/2019   No results found for: Derl Barrow Lab Results  Component Value Date   CREATININE 1.22 (H) 10/26/2020   Lab Results  Component Value Date   CHOL 199 02/17/2020   HDL 41 02/17/2020   LDLCALC 121 02/17/2020   TRIG 210 (A) 02/17/2020   CHOLHDL 3 06/03/2018   Lab Results  Component Value Date   NA 138 10/26/2020   K 4.4 10/26/2020   CREATININE 1.22 (H) 10/26/2020   GFRNONAA 50 (L) 10/26/2020   GFRAA 55 (L) 06/08/2020   GLUCOSE 106 (H) 10/26/2020    The 10-year ASCVD risk score Mikey Bussing DC Jr., et al., 2013) is: 12.3%*   Values used to calculate the score:     Age: 63 years     Sex: Female     Is Non-Hispanic African American: No     Diabetic: Yes     Tobacco smoker: No     Systolic Blood Pressure: 161 mmHg     Is BP treated: Yes     HDL Cholesterol: 41 mg/dL*     Total Cholesterol: 199 mg/dL*     * - Cholesterol units were assumed for this score calculation    Past Medical History:  Diagnosis Date  . Breast cancer metastasized to bone ([place])   . Diabetes mellitus without complication ([place])   . Hyperlipidemia   . Hypertension   . Lymphedema of right arm   . Neuropathy associated with cancer ([place])   . Obesity (BMI 35.0-39.9 without comorbidity)     Past Surgical History:  Procedure  Laterality Date  . CATARACT EXTRACTION Left   . HYSTERECTOMY ABDOMINAL WITH SALPINGO-OOPHORECTOMY    . MODIFIED RADICAL MASTECTOMY Right     Social History   Socioeconomic History  . Marital status: Married    Spouse name: Not on file  . Number of children: Not on file  . Years of education: Not on file  . Highest education level: Not on file  Occupational History  . Not on file  Tobacco Use  . Smoking status: Never Smoker  . Smokeless tobacco: Never Used  Vaping Use  . Vaping Use: Never used  Substance and Sexual Activity  . Alcohol use: Not Currently  . Drug use: Never  . Sexual activity: Not Currently  Other Topics Concern  . Not on file  Social History Narrative  . Not on file   Social Determinants of Health   Financial Resource Strain: Low Risk   . Difficulty of Paying Living Expenses: Not hard at all  Food Insecurity: No Food Insecurity  . Worried About Charity fundraiser in the Last Year: Never true  . Ran Out of Food in the Last  Year: Never true  Transportation Needs: No Transportation Needs  . Lack of Transportation (Medical): No  . Lack of Transportation (Non-Medical): No  Physical Activity: Inactive  . Days of Exercise per Week: 0 days  . Minutes of Exercise per Session: 0 min  Stress: No Stress Concern Present  . Feeling of Stress : Not at all  Social Connections: Moderately Isolated  . Frequency of Communication with Friends and Family: More than three times a week  . Frequency of Social Gatherings with Friends and Family: More than three times a week  . Attends Religious Services: Never  . Active Member of Clubs or Organizations: No  . Attends Archivist Meetings: Never  . Marital Status: Married  Human resources officer Violence: Not At Risk  . Fear of Current or Ex-Partner: No  . Emotionally Abused: No  . Physically Abused: No  . Sexually Abused: No    Family History  Problem Relation Age of Onset  . Diabetes Mother   . Breast cancer  Mother 91  . Cerebral aneurysm Mother   . Pulmonary embolism Father   . Colon cancer Father 72  . Breast cancer Sister 67       noninvasive  . Breast cancer Paternal Grandmother 78     Immunization History  Administered Date(s) Administered  . Influenza,inj,Quad PF,6+ Mos 09/02/2018, 07/14/2019  . Influenza-Unspecified 06/30/2010, 08/12/2013, 07/02/2014, 07/13/2015, 07/18/2016  . PFIZER(Purple Top)SARS-COV-2 Vaccination 12/16/2019, 01/14/2020, 08/16/2020  . Pneumococcal Polysaccharide-23 08/11/2008  . Td 10/20/2002  . Tdap 06/24/2012    Outpatient Encounter Medications as of 11/04/2020  Medication Sig  . amLODipine (NORVASC) 10 MG tablet Take 10 mg by mouth daily.  Marland Kitchen aspirin 81 MG chewable tablet Chew by mouth daily.  . fenofibrate (TRICOR) 145 MG tablet Take 1 tablet (145 mg total) by mouth daily.  . fluticasone (FLONASE) 50 MCG/ACT nasal spray SPRAY 2 SPRAYS INTO EACH NOSTRIL EVERY DAY  . gabapentin (NEURONTIN) 300 MG capsule Take 1 capsule (300 mg total) by mouth 4 (four) times daily. Pt is only taking 2 capsules a day  . ibuprofen (ADVIL) 800 MG tablet Take 1 tablet (800 mg total) by mouth every 8 (eight) hours as needed.  . Insulin Admin Supplies MISC Inject 24 Units into the skin daily.  . insulin glargine (LANTUS SOLOSTAR) 100 UNIT/ML Solostar Pen INJECT 24 UNITS INTO THE SKIN DAILY  . Insulin Pen Needle 31G X 5 MM MISC UAD for Sq inj for DM qd  . losartan (COZAAR) 50 MG tablet Take 1 tablet (50 mg total) by mouth 2 (two) times daily.  . metoprolol succinate (TOPROL-XL) 25 MG 24 hr tablet TAKE 1 TABLET BY MOUTH EVERY DAY  . oxyCODONE (OXY IR/ROXICODONE) 5 MG immediate release tablet Take 1-2 tablets (5-10 mg total) by mouth every 4 (four) hours as needed for severe pain.  Marland Kitchen oxyCODONE (OXYCONTIN) 20 mg 12 hr tablet Take 1 tablet (20 mg total) by mouth every 8 (eight) hours.  . pantoprazole (PROTONIX) 40 MG tablet TAKE 1 TABLET BY MOUTH EVERY DAY  . pioglitazone (ACTOS) 15 MG  tablet TAKE 1 TABLET BY MOUTH EVERY DAY  . pravastatin (PRAVACHOL) 20 MG tablet Take 1 tablet (20 mg total) by mouth daily.  . prochlorperazine (COMPAZINE) 10 MG tablet Take 1 tablet (10 mg total) by mouth every 6 (six) hours as needed (Nausea or vomiting).  . sitaGLIPtin-metformin (JANUMET) 50-1000 MG tablet Take 1 tablet by mouth daily with breakfast.   No facility-administered encounter medications on file  as of 11/04/2020.     ROS: Pertinent positives and negatives noted in HPI. Remainder of ROS non-contributory   Allergies  Allergen Reactions  . Morphine And Related Anaphylaxis    BP 118/72   Pulse 87   Temp 98.4 F (36.9 C) (Temporal)   Ht 5\' 4"  (1.626 m)   Wt 215 lb 12.8 oz (97.9 kg)   LMP 10/01/2006 Comment: Total Hysterectomy  SpO2 99%   BMI 37.04 kg/m   Wt Readings from Last 3 Encounters:  11/04/20 215 lb 12.8 oz (97.9 kg)  11/01/20 212 lb (96.2 kg)  10/26/20 214 lb 9.6 oz (97.3 kg)   Temp Readings from Last 3 Encounters:  11/04/20 98.4 F (36.9 C) (Temporal)  11/01/20 (!) 96.9 F (36.1 C) (Temporal)  10/26/20 97.9 F (36.6 C) (Tympanic)   BP Readings from Last 3 Encounters:  11/04/20 118/72  11/01/20 126/70  10/26/20 133/73   Pulse Readings from Last 3 Encounters:  11/04/20 87  11/01/20 85  10/26/20 85     Physical Exam Constitutional:      General: She is not in acute distress.    Appearance: She is obese.  Cardiovascular:     Rate and Rhythm: Normal rate and regular rhythm.     Pulses: Normal pulses.  Pulmonary:     Effort: Pulmonary effort is normal. No respiratory distress.     Breath sounds: Normal breath sounds.  Skin:    Coloration: Skin is pale.  Neurological:     Mental Status: She is alert and oriented to person, place, and time. Mental status is at baseline.  Psychiatric:        Mood and Affect: Mood normal.        Behavior: Behavior normal.      A/P:  1. Diabetes mellitus type 2 in obese (HCC) - controlled, at goal -  cont current meds - janumet 50-1000mg  daily, actos 15mg  daily, lantus 24 units daily - cont statin and ARB - POCT HgB A1C - Microalbumin / creatinine urine ratio - f/u in 6 mo or sooner PRN  2. Essential hypertension - controlled, at goal - cont current meds - losartan 50mg  BID, norvasc 10mg  daily, toprol XL 50mg  daily  3. Hyperlipidemia, unspecified hyperlipidemia type - last FLP in 5/2021and LDL not at goal at that time. Pt had restarted pravastatin after that and will plan to recheck FLP at next OV (pt is not fasting today) - for now, cont pravastatin 20mg  and fenofibrate 145mg  daily  F/u in 6 mo or sooner PRN  This visit occurred during the SARS-CoV-2 public health emergency.  Safety protocols were in place, including screening questions prior to the visit, additional usage of staff PPE, and extensive cleaning of exam room while observing appropriate contact time as indicated for disinfecting solutions.

## 2020-11-06 DIAGNOSIS — Z170 Estrogen receptor positive status [ER+]: Secondary | ICD-10-CM | POA: Diagnosis not present

## 2020-11-06 DIAGNOSIS — Z20822 Contact with and (suspected) exposure to covid-19: Secondary | ICD-10-CM | POA: Diagnosis not present

## 2020-11-06 DIAGNOSIS — C50911 Malignant neoplasm of unspecified site of right female breast: Secondary | ICD-10-CM | POA: Diagnosis not present

## 2020-11-06 DIAGNOSIS — Z510 Encounter for antineoplastic radiation therapy: Secondary | ICD-10-CM | POA: Diagnosis not present

## 2020-11-06 DIAGNOSIS — G893 Neoplasm related pain (acute) (chronic): Secondary | ICD-10-CM | POA: Diagnosis not present

## 2020-11-06 DIAGNOSIS — Z853 Personal history of malignant neoplasm of breast: Secondary | ICD-10-CM | POA: Diagnosis not present

## 2020-11-06 DIAGNOSIS — C7951 Secondary malignant neoplasm of bone: Secondary | ICD-10-CM | POA: Diagnosis not present

## 2020-11-06 DIAGNOSIS — Z5111 Encounter for antineoplastic chemotherapy: Secondary | ICD-10-CM | POA: Diagnosis not present

## 2020-11-07 ENCOUNTER — Other Ambulatory Visit: Payer: Self-pay

## 2020-11-07 ENCOUNTER — Ambulatory Visit
Admission: RE | Admit: 2020-11-07 | Discharge: 2020-11-07 | Disposition: A | Discharge status: Home / Self Care | Payer: MEDICARE | Source: Ambulatory Visit | Attending: Radiation Oncology | Admitting: Radiation Oncology

## 2020-11-07 DIAGNOSIS — Z20822 Contact with and (suspected) exposure to covid-19: Secondary | ICD-10-CM | POA: Diagnosis not present

## 2020-11-07 DIAGNOSIS — Z853 Personal history of malignant neoplasm of breast: Secondary | ICD-10-CM | POA: Diagnosis not present

## 2020-11-07 DIAGNOSIS — C7951 Secondary malignant neoplasm of bone: Secondary | ICD-10-CM | POA: Diagnosis not present

## 2020-11-07 DIAGNOSIS — C50911 Malignant neoplasm of unspecified site of right female breast: Secondary | ICD-10-CM | POA: Diagnosis not present

## 2020-11-07 DIAGNOSIS — Z510 Encounter for antineoplastic radiation therapy: Secondary | ICD-10-CM | POA: Diagnosis not present

## 2020-11-07 DIAGNOSIS — Z170 Estrogen receptor positive status [ER+]: Secondary | ICD-10-CM | POA: Diagnosis not present

## 2020-11-07 DIAGNOSIS — G893 Neoplasm related pain (acute) (chronic): Secondary | ICD-10-CM | POA: Diagnosis not present

## 2020-11-07 DIAGNOSIS — Z5111 Encounter for antineoplastic chemotherapy: Secondary | ICD-10-CM | POA: Diagnosis not present

## 2020-11-08 ENCOUNTER — Other Ambulatory Visit: Payer: Self-pay

## 2020-11-08 ENCOUNTER — Ambulatory Visit
Admission: RE | Admit: 2020-11-08 | Discharge: 2020-11-08 | Disposition: A | Discharge status: Home / Self Care | Payer: MEDICARE | Source: Ambulatory Visit | Attending: Radiation Oncology | Admitting: Radiation Oncology

## 2020-11-08 DIAGNOSIS — Z170 Estrogen receptor positive status [ER+]: Secondary | ICD-10-CM | POA: Diagnosis not present

## 2020-11-08 DIAGNOSIS — C7951 Secondary malignant neoplasm of bone: Secondary | ICD-10-CM | POA: Diagnosis not present

## 2020-11-08 DIAGNOSIS — C50911 Malignant neoplasm of unspecified site of right female breast: Secondary | ICD-10-CM | POA: Diagnosis not present

## 2020-11-08 DIAGNOSIS — Z510 Encounter for antineoplastic radiation therapy: Secondary | ICD-10-CM | POA: Diagnosis not present

## 2020-11-08 DIAGNOSIS — Z20822 Contact with and (suspected) exposure to covid-19: Secondary | ICD-10-CM | POA: Diagnosis not present

## 2020-11-08 DIAGNOSIS — Z5111 Encounter for antineoplastic chemotherapy: Secondary | ICD-10-CM | POA: Diagnosis not present

## 2020-11-09 ENCOUNTER — Ambulatory Visit
Admission: RE | Admit: 2020-11-09 | Discharge: 2020-11-09 | Disposition: A | Discharge status: Home / Self Care | Payer: MEDICARE | Source: Ambulatory Visit | Attending: Radiation Oncology | Admitting: Radiation Oncology

## 2020-11-09 DIAGNOSIS — Z20822 Contact with and (suspected) exposure to covid-19: Secondary | ICD-10-CM | POA: Diagnosis not present

## 2020-11-09 DIAGNOSIS — Z510 Encounter for antineoplastic radiation therapy: Secondary | ICD-10-CM | POA: Diagnosis not present

## 2020-11-09 DIAGNOSIS — C7951 Secondary malignant neoplasm of bone: Secondary | ICD-10-CM | POA: Diagnosis not present

## 2020-11-09 DIAGNOSIS — C50911 Malignant neoplasm of unspecified site of right female breast: Secondary | ICD-10-CM | POA: Diagnosis not present

## 2020-11-09 DIAGNOSIS — Z170 Estrogen receptor positive status [ER+]: Secondary | ICD-10-CM | POA: Diagnosis not present

## 2020-11-09 DIAGNOSIS — Z5111 Encounter for antineoplastic chemotherapy: Secondary | ICD-10-CM | POA: Diagnosis not present

## 2020-11-10 ENCOUNTER — Ambulatory Visit
Admission: RE | Admit: 2020-11-10 | Discharge: 2020-11-10 | Disposition: A | Discharge status: Home / Self Care | Payer: MEDICARE | Source: Ambulatory Visit | Attending: Radiation Oncology | Admitting: Radiation Oncology

## 2020-11-10 ENCOUNTER — Ambulatory Visit: Payer: MEDICARE | Admitting: Oncology

## 2020-11-10 ENCOUNTER — Other Ambulatory Visit: Payer: MEDICARE

## 2020-11-10 DIAGNOSIS — C7951 Secondary malignant neoplasm of bone: Secondary | ICD-10-CM | POA: Diagnosis not present

## 2020-11-10 DIAGNOSIS — Z20822 Contact with and (suspected) exposure to covid-19: Secondary | ICD-10-CM | POA: Diagnosis not present

## 2020-11-10 DIAGNOSIS — Z510 Encounter for antineoplastic radiation therapy: Secondary | ICD-10-CM | POA: Diagnosis not present

## 2020-11-10 DIAGNOSIS — Z170 Estrogen receptor positive status [ER+]: Secondary | ICD-10-CM | POA: Diagnosis not present

## 2020-11-10 DIAGNOSIS — C50911 Malignant neoplasm of unspecified site of right female breast: Secondary | ICD-10-CM | POA: Diagnosis not present

## 2020-11-10 DIAGNOSIS — Z5111 Encounter for antineoplastic chemotherapy: Secondary | ICD-10-CM | POA: Diagnosis not present

## 2020-11-11 ENCOUNTER — Inpatient Hospital Stay: Payer: MEDICARE

## 2020-11-11 ENCOUNTER — Ambulatory Visit
Admission: RE | Admit: 2020-11-11 | Discharge: 2020-11-11 | Disposition: A | Discharge status: Home / Self Care | Payer: MEDICARE | Source: Ambulatory Visit | Attending: Radiation Oncology | Admitting: Radiation Oncology

## 2020-11-11 ENCOUNTER — Other Ambulatory Visit: Payer: Self-pay

## 2020-11-11 VITALS — BP 141/74 | HR 93 | Temp 98.3°F | Resp 20 | Wt 209.8 lb

## 2020-11-11 DIAGNOSIS — C50919 Malignant neoplasm of unspecified site of unspecified female breast: Secondary | ICD-10-CM

## 2020-11-11 DIAGNOSIS — C779 Secondary and unspecified malignant neoplasm of lymph node, unspecified: Secondary | ICD-10-CM | POA: Insufficient documentation

## 2020-11-11 DIAGNOSIS — Z20822 Contact with and (suspected) exposure to covid-19: Secondary | ICD-10-CM | POA: Diagnosis not present

## 2020-11-11 DIAGNOSIS — C7951 Secondary malignant neoplasm of bone: Secondary | ICD-10-CM

## 2020-11-11 DIAGNOSIS — Z170 Estrogen receptor positive status [ER+]: Secondary | ICD-10-CM | POA: Insufficient documentation

## 2020-11-11 DIAGNOSIS — Z5111 Encounter for antineoplastic chemotherapy: Secondary | ICD-10-CM | POA: Insufficient documentation

## 2020-11-11 DIAGNOSIS — Z1152 Encounter for screening for COVID-19: Secondary | ICD-10-CM | POA: Insufficient documentation

## 2020-11-11 DIAGNOSIS — C7802 Secondary malignant neoplasm of left lung: Secondary | ICD-10-CM | POA: Insufficient documentation

## 2020-11-11 DIAGNOSIS — G893 Neoplasm related pain (acute) (chronic): Secondary | ICD-10-CM | POA: Insufficient documentation

## 2020-11-11 DIAGNOSIS — Z923 Personal history of irradiation: Secondary | ICD-10-CM | POA: Insufficient documentation

## 2020-11-11 DIAGNOSIS — Z510 Encounter for antineoplastic radiation therapy: Secondary | ICD-10-CM | POA: Diagnosis not present

## 2020-11-11 DIAGNOSIS — C50911 Malignant neoplasm of unspecified site of right female breast: Secondary | ICD-10-CM | POA: Insufficient documentation

## 2020-11-11 DIAGNOSIS — E785 Hyperlipidemia, unspecified: Secondary | ICD-10-CM | POA: Insufficient documentation

## 2020-11-11 DIAGNOSIS — C7800 Secondary malignant neoplasm of unspecified lung: Secondary | ICD-10-CM

## 2020-11-11 DIAGNOSIS — M879 Osteonecrosis, unspecified: Secondary | ICD-10-CM

## 2020-11-11 DIAGNOSIS — Z9221 Personal history of antineoplastic chemotherapy: Secondary | ICD-10-CM | POA: Insufficient documentation

## 2020-11-11 LAB — COMPREHENSIVE METABOLIC PANEL
ALT: 10 U/L (ref 0–44)
AST: 19 U/L (ref 15–41)
Albumin: 3.3 g/dL — ABNORMAL LOW (ref 3.5–5.0)
Alkaline Phosphatase: 99 U/L (ref 38–126)
Anion gap: 8 (ref 5–15)
BUN: 24 mg/dL — ABNORMAL HIGH (ref 8–23)
CO2: 26 mmol/L (ref 22–32)
Calcium: 9.6 mg/dL (ref 8.9–10.3)
Chloride: 104 mmol/L (ref 98–111)
Creatinine, Ser: 1.42 mg/dL — ABNORMAL HIGH (ref 0.44–1.00)
GFR, Estimated: 41 mL/min — ABNORMAL LOW (ref >60)
Glucose, Bld: 112 mg/dL — ABNORMAL HIGH (ref 70–99)
Potassium: 4.4 mmol/L (ref 3.5–5.1)
Sodium: 138 mmol/L (ref 135–145)
Total Bilirubin: 0.3 mg/dL (ref 0.3–1.2)
Total Protein: 7 g/dL (ref 6.5–8.1)

## 2020-11-11 LAB — CBC WITH DIFFERENTIAL/PLATELET
Abs Immature Granulocytes: 0.16 10*3/uL — ABNORMAL HIGH (ref 0.00–0.07)
Basophils Absolute: 0.1 10*3/uL (ref 0.0–0.1)
Basophils Relative: 2 %
Eosinophils Absolute: 0.1 10*3/uL (ref 0.0–0.5)
Eosinophils Relative: 1 %
HCT: 30.7 % — ABNORMAL LOW (ref 36.0–46.0)
Hemoglobin: 9.6 g/dL — ABNORMAL LOW (ref 12.0–15.0)
Immature Granulocytes: 3 %
Lymphocytes Relative: 10 %
Lymphs Abs: 0.7 10*3/uL (ref 0.7–4.0)
MCH: 33.9 pg (ref 26.0–34.0)
MCHC: 31.3 g/dL (ref 30.0–36.0)
MCV: 108.5 fL — ABNORMAL HIGH (ref 80.0–100.0)
Monocytes Absolute: 0.7 10*3/uL (ref 0.1–1.0)
Monocytes Relative: 11 %
Neutro Abs: 4.6 10*3/uL (ref 1.7–7.7)
Neutrophils Relative %: 73 %
Platelets: 479 10*3/uL — ABNORMAL HIGH (ref 150–400)
RBC: 2.83 MIL/uL — ABNORMAL LOW (ref 3.87–5.11)
RDW: 15.7 % — ABNORMAL HIGH (ref 11.5–15.5)
WBC: 6.4 10*3/uL (ref 4.0–10.5)
nRBC: 0 % (ref 0.0–0.2)

## 2020-11-11 MED ORDER — PALONOSETRON HCL INJECTION 0.25 MG/5ML
0.2500 mg | Freq: Once | INTRAVENOUS | Status: AC
  Administered 2020-11-11: 0.25 mg via INTRAVENOUS

## 2020-11-11 MED ORDER — FLUOROURACIL CHEMO INJECTION 2.5 GM/50ML
600.0000 mg/m2 | Freq: Once | INTRAVENOUS | Status: AC
  Administered 2020-11-11: 1250 mg via INTRAVENOUS
  Filled 2020-11-11: qty 25

## 2020-11-11 MED ORDER — SODIUM CHLORIDE 0.9% FLUSH
10.0000 mL | INTRAVENOUS | Status: DC | PRN
  Filled 2020-11-11: qty 10

## 2020-11-11 MED ORDER — SODIUM CHLORIDE 0.9 % IV SOLN
Freq: Once | INTRAVENOUS | Status: AC
  Filled 2020-11-11: qty 250

## 2020-11-11 MED ORDER — SODIUM CHLORIDE 0.9 % IV SOLN
10.0000 mg | Freq: Once | INTRAVENOUS | Status: AC
  Administered 2020-11-11: 10 mg via INTRAVENOUS
  Filled 2020-11-11: qty 10

## 2020-11-11 MED ORDER — SODIUM CHLORIDE 0.9 % IV SOLN
600.0000 mg/m2 | Freq: Once | INTRAVENOUS | Status: AC
  Administered 2020-11-11: 1260 mg via INTRAVENOUS
  Filled 2020-11-11: qty 63

## 2020-11-11 MED ORDER — PALONOSETRON HCL INJECTION 0.25 MG/5ML
INTRAVENOUS | Status: AC
  Filled 2020-11-11: qty 5

## 2020-11-11 NOTE — Patient Instructions (Signed)
[facility] Discharge Instructions for Patients Receiving Chemotherapy  Today you received the following chemotherapy agents Cyclophosphamide, Fluorouracil.   To help prevent nausea and vomiting after your treatment, we encourage you to take your nausea medication as directed.   If you develop nausea and vomiting that is not controlled by your nausea medication, call the clinic.   BELOW ARE SYMPTOMS THAT SHOULD BE REPORTED IMMEDIATELY:  *FEVER GREATER THAN 100.5 F  *CHILLS WITH OR WITHOUT FEVER  NAUSEA AND VOMITING THAT IS NOT CONTROLLED WITH YOUR NAUSEA MEDICATION  *UNUSUAL SHORTNESS OF BREATH  *UNUSUAL BRUISING OR BLEEDING  TENDERNESS IN MOUTH AND THROAT WITH OR WITHOUT PRESENCE OF ULCERS  *URINARY PROBLEMS  *BOWEL PROBLEMS  UNUSUAL RASH Items with * indicate a potential emergency and should be followed up as soon as possible.  Feel free to call the clinic should you have any questions or concerns. The clinic phone number is [phone].  Please show the [place] at check-in to the Emergency Department and triage nurse.

## 2020-11-12 LAB — CANCER ANTIGEN 27.29: CA 27.29: 297.9 U/mL — ABNORMAL HIGH (ref 0.0–38.6)

## 2020-11-14 ENCOUNTER — Other Ambulatory Visit: Payer: Self-pay | Admitting: Oncology

## 2020-11-14 ENCOUNTER — Ambulatory Visit
Admission: RE | Admit: 2020-11-14 | Discharge: 2020-11-14 | Disposition: A | Discharge status: Home / Self Care | Payer: MEDICARE | Source: Ambulatory Visit | Attending: Radiation Oncology | Admitting: Radiation Oncology

## 2020-11-14 DIAGNOSIS — Z170 Estrogen receptor positive status [ER+]: Secondary | ICD-10-CM | POA: Diagnosis not present

## 2020-11-14 DIAGNOSIS — Z853 Personal history of malignant neoplasm of breast: Secondary | ICD-10-CM | POA: Diagnosis not present

## 2020-11-14 DIAGNOSIS — G893 Neoplasm related pain (acute) (chronic): Secondary | ICD-10-CM | POA: Diagnosis not present

## 2020-11-14 DIAGNOSIS — Z5111 Encounter for antineoplastic chemotherapy: Secondary | ICD-10-CM | POA: Diagnosis not present

## 2020-11-14 DIAGNOSIS — Z20822 Contact with and (suspected) exposure to covid-19: Secondary | ICD-10-CM | POA: Diagnosis not present

## 2020-11-14 DIAGNOSIS — Z510 Encounter for antineoplastic radiation therapy: Secondary | ICD-10-CM | POA: Diagnosis not present

## 2020-11-14 DIAGNOSIS — C50911 Malignant neoplasm of unspecified site of right female breast: Secondary | ICD-10-CM | POA: Diagnosis not present

## 2020-11-14 DIAGNOSIS — C7951 Secondary malignant neoplasm of bone: Secondary | ICD-10-CM | POA: Diagnosis not present

## 2020-11-15 ENCOUNTER — Ambulatory Visit
Admission: RE | Admit: 2020-11-15 | Discharge: 2020-11-15 | Disposition: A | Discharge status: Home / Self Care | Payer: MEDICARE | Source: Ambulatory Visit | Attending: Radiation Oncology | Admitting: Radiation Oncology

## 2020-11-15 DIAGNOSIS — C7951 Secondary malignant neoplasm of bone: Secondary | ICD-10-CM | POA: Diagnosis not present

## 2020-11-15 DIAGNOSIS — C50911 Malignant neoplasm of unspecified site of right female breast: Secondary | ICD-10-CM | POA: Diagnosis not present

## 2020-11-15 DIAGNOSIS — Z5111 Encounter for antineoplastic chemotherapy: Secondary | ICD-10-CM | POA: Diagnosis not present

## 2020-11-15 DIAGNOSIS — Z170 Estrogen receptor positive status [ER+]: Secondary | ICD-10-CM | POA: Diagnosis not present

## 2020-11-15 DIAGNOSIS — Z510 Encounter for antineoplastic radiation therapy: Secondary | ICD-10-CM | POA: Diagnosis not present

## 2020-11-15 DIAGNOSIS — Z20822 Contact with and (suspected) exposure to covid-19: Secondary | ICD-10-CM | POA: Diagnosis not present

## 2020-11-16 ENCOUNTER — Ambulatory Visit
Admission: RE | Admit: 2020-11-16 | Discharge: 2020-11-16 | Disposition: A | Discharge status: Home / Self Care | Payer: MEDICARE | Source: Ambulatory Visit | Attending: Radiation Oncology | Admitting: Radiation Oncology

## 2020-11-16 DIAGNOSIS — Z510 Encounter for antineoplastic radiation therapy: Secondary | ICD-10-CM | POA: Diagnosis not present

## 2020-11-16 DIAGNOSIS — Z5111 Encounter for antineoplastic chemotherapy: Secondary | ICD-10-CM | POA: Diagnosis not present

## 2020-11-16 DIAGNOSIS — Z20822 Contact with and (suspected) exposure to covid-19: Secondary | ICD-10-CM | POA: Diagnosis not present

## 2020-11-16 DIAGNOSIS — Z170 Estrogen receptor positive status [ER+]: Secondary | ICD-10-CM | POA: Diagnosis not present

## 2020-11-16 DIAGNOSIS — C50911 Malignant neoplasm of unspecified site of right female breast: Secondary | ICD-10-CM | POA: Diagnosis not present

## 2020-11-16 DIAGNOSIS — C7951 Secondary malignant neoplasm of bone: Secondary | ICD-10-CM | POA: Diagnosis not present

## 2020-11-17 ENCOUNTER — Ambulatory Visit
Admission: RE | Admit: 2020-11-17 | Discharge: 2020-11-17 | Disposition: A | Discharge status: Home / Self Care | Payer: MEDICARE | Source: Ambulatory Visit | Attending: Radiation Oncology | Admitting: Radiation Oncology

## 2020-11-17 DIAGNOSIS — Z5111 Encounter for antineoplastic chemotherapy: Secondary | ICD-10-CM | POA: Diagnosis not present

## 2020-11-17 DIAGNOSIS — Z170 Estrogen receptor positive status [ER+]: Secondary | ICD-10-CM | POA: Diagnosis not present

## 2020-11-17 DIAGNOSIS — Z510 Encounter for antineoplastic radiation therapy: Secondary | ICD-10-CM | POA: Diagnosis not present

## 2020-11-17 DIAGNOSIS — C7951 Secondary malignant neoplasm of bone: Secondary | ICD-10-CM | POA: Diagnosis not present

## 2020-11-17 DIAGNOSIS — C50911 Malignant neoplasm of unspecified site of right female breast: Secondary | ICD-10-CM | POA: Diagnosis not present

## 2020-11-17 DIAGNOSIS — Z20822 Contact with and (suspected) exposure to covid-19: Secondary | ICD-10-CM | POA: Diagnosis not present

## 2020-11-18 ENCOUNTER — Ambulatory Visit
Admission: RE | Admit: 2020-11-18 | Discharge: 2020-11-18 | Disposition: A | Discharge status: Home / Self Care | Payer: MEDICARE | Source: Ambulatory Visit | Attending: Radiation Oncology | Admitting: Radiation Oncology

## 2020-11-18 ENCOUNTER — Other Ambulatory Visit: Payer: MEDICARE

## 2020-11-18 ENCOUNTER — Encounter: Payer: Self-pay | Admitting: Adult Health

## 2020-11-18 ENCOUNTER — Inpatient Hospital Stay (HOSPITAL_BASED_OUTPATIENT_CLINIC_OR_DEPARTMENT_OTHER): Payer: MEDICARE | Admitting: Medical

## 2020-11-18 ENCOUNTER — Ambulatory Visit (HOSPITAL_COMMUNITY)
Admission: RE | Admit: 2020-11-18 | Discharge: 2020-11-18 | Disposition: A | Discharge status: Home / Self Care | Payer: MEDICARE | Source: Ambulatory Visit | Attending: Adult Health | Admitting: Adult Health

## 2020-11-18 ENCOUNTER — Other Ambulatory Visit: Payer: Self-pay

## 2020-11-18 ENCOUNTER — Inpatient Hospital Stay (HOSPITAL_BASED_OUTPATIENT_CLINIC_OR_DEPARTMENT_OTHER): Payer: MEDICARE | Admitting: Adult Health

## 2020-11-18 VITALS — BP 127/65 | HR 95 | Temp 97.5°F | Resp 18 | Ht 64.0 in | Wt 219.8 lb

## 2020-11-18 DIAGNOSIS — R059 Cough, unspecified: Secondary | ICD-10-CM

## 2020-11-18 DIAGNOSIS — C50919 Malignant neoplasm of unspecified site of unspecified female breast: Secondary | ICD-10-CM

## 2020-11-18 DIAGNOSIS — C7800 Secondary malignant neoplasm of unspecified lung: Secondary | ICD-10-CM

## 2020-11-18 DIAGNOSIS — C7951 Secondary malignant neoplasm of bone: Secondary | ICD-10-CM | POA: Diagnosis not present

## 2020-11-18 DIAGNOSIS — Z5111 Encounter for antineoplastic chemotherapy: Secondary | ICD-10-CM | POA: Diagnosis not present

## 2020-11-18 DIAGNOSIS — J90 Pleural effusion, not elsewhere classified: Secondary | ICD-10-CM | POA: Diagnosis not present

## 2020-11-18 DIAGNOSIS — Z20822 Contact with and (suspected) exposure to covid-19: Secondary | ICD-10-CM | POA: Diagnosis not present

## 2020-11-18 DIAGNOSIS — Z170 Estrogen receptor positive status [ER+]: Secondary | ICD-10-CM | POA: Diagnosis not present

## 2020-11-18 DIAGNOSIS — C50911 Malignant neoplasm of unspecified site of right female breast: Secondary | ICD-10-CM | POA: Diagnosis not present

## 2020-11-18 DIAGNOSIS — Z510 Encounter for antineoplastic radiation therapy: Secondary | ICD-10-CM | POA: Diagnosis not present

## 2020-11-18 LAB — SARS CORONAVIRUS 2 (TAT 6-24 HRS): SARS Coronavirus 2: NEGATIVE

## 2020-11-18 NOTE — Patient Instructions (Signed)
Thoracentesis  A thoracentesis is a procedure to remove excess fluid that has built up in the space between the linings of the chest wall and the lungs (pleural space). It is normal to have a small amount of fluid in the pleural space. Some medical conditions, such as heart failure, pneumonia, kidney problems, or cancer, can create too much fluid. This extra fluid is removed using a needle that is inserted through the skin and tissue and into the pleural space. A thoracentesis may be done to:  Understand why there is extra fluid in the pleural space and to create a treatment plan that is right for you.  Help get rid of shortness of breath, discomfort, or pain that is caused by the extra fluid. Tell a health care provider about:  Any allergies you have.  All medicines you are taking, including vitamins, herbs, eye drops, creams, and over-the-counter medicines. This includes any use of steroids, either by mouth or as a cream.  Any problems you or family members have had with anesthetic medicines.  Any blood disorders you have, including any history of blood clots.  Any surgeries you have had.  Medical conditions you have, including a frequent cough or coughing episodes.  Whether you are pregnant or may be pregnant. What are the risks? Generally, this is a safe procedure. However, problems may occur, including:  Infection.  Bleeding.  Injury to the lung.  Injury to surrounding tissues or organs.  Collapse of the lung. What happens before the procedure?  Follow instructions from your health care provider about hydration, which may include: ? Up to 2 hours before the procedure - you may continue to drink clear liquids, such as water, clear fruit juice, black coffee, and plain tea. Eating and drinking restrictions Follow instructions from your health care provider about eating and drinking, which may include:  8 hours before the procedure - stop eating heavy meals or foods such as  meat, fried foods, or fatty foods.  6 hours before the procedure - stop eating light meals or foods, such as toast or cereal.  6 hours before the procedure - stop drinking milk or drinks that contain milk.  2 hours before the procedure - stop drinking clear liquids. Medicines Ask your health care provider about:  Changing or stopping your regular medicines. This is especially important if you are taking diabetes medicines or blood thinners.  Taking medicines such as aspirin and ibuprofen. These medicines can thin your blood. Do not take these medicines unless your health care provider tells you to take them.  Taking over-the-counter medicines, vitamins, herbs, and supplements.  Taking a cough suppressant if you have a frequent cough or coughing episodes. General instructions  You may have a chest X-ray or another imaging test, such as a CT scan or ultrasound, to determine the location and amount of fluid in your pleural space.  Plan to have someone take you home from the hospital or clinic.  Plan to have a responsible adult care for you for at least 24 hours after you leave the hospital or clinic. This is important.  Ask your health care provider what steps will be taken to help prevent infection. These may include: ? Removing hair at the needle-insertion site. ? Washing skin with a germ-killing soap. What happens during the procedure?  You will be asked to sit upright and lean slightly forward for the procedure.  An IV will be inserted into one of your veins.  You will be given one  or both of the following: ? A medicine to help you relax (sedative). ? A medicine to numb the area (local anesthetic).  The health care provider will insert a needle into your back so that it goes between the ribs and into the pleural space. You may feel pressure or slight pain as the needle is positioned into the pleural space.  Fluid will be removed from the pleural space through the needle. You  may feel pressure as the fluid is removed.  The health care provider will take the needle out after the excess fluid has been removed. A sample of the fluid may be sent to the lab for testing.  The needle insertion site (puncture site) will be covered with a bandage (dressing). The procedure may vary among health care providers and hospitals. What happens after the procedure?  Your blood pressure, heart rate, breathing rate, and blood oxygen level will be monitored until you leave the hospital or clinic.  A chest X-ray may be done to check the amount of fluid that remains in your pleural space.  If a sample of fluid was sent for testing, ask your health care provider, or the department that did the procedure, when your results will be ready. It is up to you to get your test results.  Do not drive for 24 hours if you were given a sedative during your procedure. Summary  A thoracentesis is a procedure to remove excess fluid that has built up in the space between the linings of the chest wall and the lungs (pleural space).  Some medical conditions, such as heart failure, pneumonia, kidney problems, or cancer, can create too much fluid.  For the procedure, a needle will be inserted between your ribs and into the pleural space. Fluid will be removed from the pleural space through the needle. The fluid may be sent to a lab for testing.  After the procedure, you may have a chest X-ray to check the amount of fluid still in your pleural space. This information is not intended to replace advice given to you by your health care provider. Make sure you discuss any questions you have with your health care provider. Document Revised: 05/19/2020 Document Reviewed: 05/19/2020 Elsevier Patient Education  2021 [employer].

## 2020-11-18 NOTE — Progress Notes (Signed)
This patient was seen for COVID-19 testing prior to a planned thoracentesis which is been scheduled for Monday.  A COVID-19 test was collected and submitted.  Sandi Mealy, MHS, PA-C Physician Assistant

## 2020-11-18 NOTE — Progress Notes (Signed)
[place]  [phone] [phone]    ID: Mia [name] DOB: Nov 04, 1955  MR#: [id number]  TKW#:[id number]  Patient Care Team: Ronnald Nian, DO as PCP - General (Family Medicine) Magrinat, Virgie Dad, MD as Consulting Physician (Oncology) [name], [name] PA   CHIEF COMPLAINT: Estrogen receptor positive stage IV breast cancer (s/p right mastectomy)  CURRENT TREATMENT: To start C[M]F chemotherapy; considering additional palliative radiation   INTERVAL HISTORY: Mia [name] returns today for follow-up of her estrogen receptor positive stage IV breast cancer.   She received her first cycle of CMF last week and tolerated this well.  She did not receive Methotrexate because she ahs been undergoing radiation therapy for her right painful bony metastases.  Today is cycle 1 day 8 of her therapy.  REVIEW OF SYSTEMS: She is taking oxycontin 2m every 8 hours, and oxycodone 546mPRN--she is taking 1-2 per day, and aloternating that with Advil 80060mhich she is taking twice a day (alternating with the Oxycodone).  She notes her pain is controlled and her right arm is gaining mobility.    Mia [name]'s main complaint is shortness of breath.  This is progressively worsening.  She notes that on her restaging she had an enlarging left pleural effusion.  She denies any cough.  Her oxygenation today was 95%.    She denies any nausea, vomiting, lethargy, increased somnolence, constipation, bladder changes, fever, chills, headaches, vision issues or other concerns.  A detailed ROS was otherwise non contributory.      COVID 19 VACCINATION STATUS: [facility], with the booster 08/16/2020   HISTORY OF CURRENT ILLNESS: From the original intake note:  [place]:  Mia [name] initially diagnosed with Stage IIIA (T2N2M0) invasive ductal  carcinoma, estrogen receptor positive and HER2 negative right breast cancer in 2000. She underwent right mastectomy, with 4 positive metastatic lymph nodes. She had chemotherapy with taxol and cytoxan and fulvestrant in the past. She had radiation and completed 5 years of tamoxifen.   She was diagnosed with Stage IV disease 04/18/2005 with metastases to the bone, lung, and additional nodes. She was on capecitabine but was discontinued after rising CA 27-29 and scans.   She started anastrozole and everolimus with XgeDelton See April 2014. She tolerated this well, but this was discontinued in July 2014 due to abnormal blood tests, hyperlipidemia and hyperglycemia.  She began Abraxane 100m14m2 and Xgeva in August 2014 weekly x3 with 1 week off. She tolerated this treatment well. She discontinued Xgeva January 2016 due to osteonecrosis of the jaw and mouth issues. She continued abraxane alone until her last dose on 11/18/2015 due to neuropathy and disease progression with restaging studies on 11/23/2015 with a CT chest abdomen and pelvis and one scan showing skull, sternal and femoral lesions.   She was switched to gemcitabine starting 01/20/2016 weekly x3 and 1 week off. This was discontinued on 06/15/2016 due to a port related jugular clot on the right side. She was given Lovenox for 3 months.  She was switched to Letrozole 2.5 mg and palbociclib 125 mg starting 07/13/2016. She tolerated this well. Restaging studies with CT chest abdomen and pelvis and bone scan on 07/01/2017 showed stable/ improving lesions. CA 27-29 was stable.  During the last few months of follow up in [place], Mia [name] completed restaging studies with a CT chest abdomen and pelvis at [facility]  12/17/2017 showing: A 6 mm pulmonary nodule in the left upper lobe laterally. Progression of metastatic disease to the bones at the L4 and L5 vertebral bodies. Sclerotic metastases at T8, T10, an T11, are similar to previous exams. Sclerotic metastases  in the sternum stable. Hepatic steatosis. Aortic atherosclerosis.  Most recent CA-58-29 (January 2019) was 58.  Most recent hemoglobin A1c was 7.1 according to the patient  The patient's subsequent history is as detailed below.   PAST MEDICAL HISTORY: Past Medical History:  Diagnosis Date  . Breast cancer metastasized to bone ([place])   . Diabetes mellitus without complication ([place])   . Hyperlipidemia   . Hypertension   . Lymphedema of right arm   . Neuropathy associated with cancer ([place])   . Obesity (BMI 35.0-39.9 without comorbidity)      PAST SURGICAL HISTORY: Past Surgical History:  Procedure Laterality Date  . CATARACT EXTRACTION Left   . HYSTERECTOMY ABDOMINAL WITH SALPINGO-OOPHORECTOMY    . MODIFIED RADICAL MASTECTOMY Right      FAMILY HISTORY: Family History  Problem Relation Age of Onset  . Diabetes Mother   . Breast cancer Mother 52  . Cerebral aneurysm Mother   . Pulmonary embolism Father   . Colon cancer Father 31  . Breast cancer Sister 43       noninvasive  . Breast cancer Paternal Grandmother 78    The patient reports she had negative genetic testing at [facility] 2014, report not available. --The patient's father died at age 46 due to a PE, and he also had a history of colon cancer diagnosed at age 75. The patient's mother died at age 71 due to diabetes and survived a cerebral aneurysm. The patient's mother also had a history of breast cancer diagnosed at age 42.  The patient has no brothers and 1 sister. The patient's sister was diagnosed with non invasive breast cancer at age 9. There was a paternal grandmother diagnosed with breast cancer at age 56. The patient denies a family history of ovarian cancer.    GYNECOLOGIC HISTORY:  Patient's last menstrual period was 10/01/2006. Menarche: 65 years old Age at first live birth: 65 years old She is [name] P2.  She is status post hysterectomy with bilateral salpingo- oophorectomy 12/23/2006 with benign  pathology ([id number]) She never used HRT.    SOCIAL HISTORY:  Mia [name] is disabled due to her breast cancer. She used to be Surveyor, quantity for a cardiology office. Her husband, [name] is in the process of retiring. He is a Electrical engineer for a power company. The patient's daughter, [name], lives in [place] and works as a Teaching laboratory technician. The patient's second daughter, [name] is a stay at home mom and is a special needs teacher, who will soon move to [facility] The patient plans on attending [employer].     ADVANCED DIRECTIVES: In the absence of any documentation to the contrary, the patient's spouse is their HCPOA.    HEALTH MAINTENANCE: Social History   Tobacco Use  . Smoking status: Never Smoker  . Smokeless tobacco: Never Used  Vaping Use  . Vaping Use: Never used  Substance Use Topics  . Alcohol use: Not Currently  . Drug use: Never    Colonoscopy: 2017   PAP:  Bone density: never  Mammogram: 2017   Allergies  Allergen Reactions  . Morphine And Related Anaphylaxis    Current Outpatient Medications  Medication Sig Dispense Refill  . amLODipine (NORVASC) 10 MG  tablet Take 10 mg by mouth daily.    Marland Kitchen aspirin 81 MG chewable tablet Chew by mouth daily.    . fenofibrate (TRICOR) 145 MG tablet Take 1 tablet (145 mg total) by mouth daily. 90 tablet 3  . fluticasone (FLONASE) 50 MCG/ACT nasal spray SPRAY 2 SPRAYS INTO EACH NOSTRIL EVERY DAY 48 mL 1  . gabapentin (NEURONTIN) 300 MG capsule Take 1 capsule (300 mg total) by mouth 4 (four) times daily. Pt is only taking 2 capsules a day 180 capsule 0  . ibuprofen (ADVIL) 800 MG tablet Take 1 tablet (800 mg total) by mouth every 8 (eight) hours as needed. 30 tablet 0  . Insulin Admin Supplies MISC Inject 24 Units into the skin daily.    . insulin glargine (LANTUS SOLOSTAR) 100 UNIT/ML Solostar Pen INJECT 24 UNITS INTO THE SKIN DAILY 15 mL 1  . Insulin Pen Needle 31G X 5 MM MISC UAD  for Sq inj for DM qd 100 each PRN  . losartan (COZAAR) 50 MG tablet Take 1 tablet (50 mg total) by mouth 2 (two) times daily. 180 tablet 0  . metoprolol succinate (TOPROL-XL) 25 MG 24 hr tablet TAKE 1 TABLET BY MOUTH EVERY DAY 90 tablet 1  . oxyCODONE (OXY IR/ROXICODONE) 5 MG immediate release tablet Take 1-2 tablets (5-10 mg total) by mouth every 4 (four) hours as needed for severe pain. 60 tablet 0  . oxyCODONE (OXYCONTIN) 20 mg 12 hr tablet Take 1 tablet (20 mg total) by mouth every 8 (eight) hours. 90 tablet 0  . pantoprazole (PROTONIX) 40 MG tablet TAKE 1 TABLET BY MOUTH EVERY DAY 90 tablet 0  . pioglitazone (ACTOS) 15 MG tablet TAKE 1 TABLET BY MOUTH EVERY DAY 90 tablet 1  . pravastatin (PRAVACHOL) 20 MG tablet Take 1 tablet (20 mg total) by mouth daily. 90 tablet 3  . prochlorperazine (COMPAZINE) 10 MG tablet Take 1 tablet (10 mg total) by mouth every 6 (six) hours as needed (Nausea or vomiting). 30 tablet 1  . sitaGLIPtin-metformin (JANUMET) 50-1000 MG tablet Take 1 tablet by mouth daily with breakfast. 90 tablet 1   No current facility-administered medications for this visit.    OBJECTIVE: White woman in poorly controlled pain Vitals:   11/18/20 1143  BP: 127/65  Pulse: 95  Resp: 18  Temp: (!) 97.5 F (36.4 C)  SpO2: 95%   Wt Readings from Last 3 Encounters:  11/18/20 219 lb 12.8 oz (99.7 kg)  11/11/20 209 lb 12.8 oz (95.2 kg)  11/04/20 215 lb 12.8 oz (97.9 kg)   Body mass index is 37.73 kg/m.   ECOG FS:2 - Symptomatic, <50% confined to bed GENERAL: Patient is a well appearing female in no acute distress HEENT:  Sclerae anicteric.  Mask in place. Neck is supple.  NODES:  No cervical, supraclavicular, or axillary lymphadenopathy palpated.  BREAST EXAM:  Deferred. LUNGS:  Diminished breath sounds in left lung, right lungs are clear.   HEART:  Regular rate and rhythm. No murmur appreciated. ABDOMEN:  Soft, nontender.  Positive, normoactive bowel sounds. No organomegaly  palpated. MSK:  No focal spinal tenderness to palpation. Full range of motion bilaterally in the upper extremities. EXTREMITIES:  No peripheral edema.   SKIN:  Clear with no obvious rashes or skin changes. No nail dyscrasia. NEURO:  Nonfocal. Well oriented.  Appropriate affect.     LAB RESULTS:  CMP   No results found for: TOTALPROTELP, ALBUMINELP, A1GS, A2GS, BETS, BETA2SER, GAMS, MSPIKE, SPEI  No  results found for: Nils Pyle, [facility]  Lab Results  Component Value Date   WBC 6.4 11/11/2020   NEUTROABS 4.6 11/11/2020   HGB 9.6 (L) 11/11/2020   HCT 30.7 (L) 11/11/2020   MCV 108.5 (H) 11/11/2020   PLT 479 (H) 11/11/2020    No results found for: LABCA2  No components found for: OHYWVP710  No results for input(s): INR in the last 168 hours.  No results found for: LABCA2  No results found for: GYI948  No results found for: NIO270  No results found for: JJK093  Lab Results  Component Value Date   CA2729 297.9 (H) 11/11/2020    No components found for: HGQUANT  No results found for: CEA1 / No results found for: CEA1   No results found for: AFPTUMOR  No results found for: CHROMOGRNA  No results found for: HGBA, HGBA2QUANT, HGBFQUANT, HGBSQUAN (Hemoglobinopathy evaluation)   No results found for: LDH  No results found for: IRON, TIBC, IRONPCTSAT (Iron and TIBC)  No results found for: FERRITIN  Urinalysis No results found for: COLORURINE, APPEARANCEUR, LABSPEC, PHURINE, GLUCOSEU, HGBUR, BILIRUBINUR, KETONESUR, PROTEINUR, UROBILINOGEN, NITRITE, LEUKOCYTESUR   STUDIES: Refuses mammography at this point (12/23/2019)   ELIGIBLE FOR AVAILABLE RESEARCH PROTOCOL: no   ASSESSMENT: 65 y.o. , [place] woman with stage IV breast cancer, as follows:  (1) status post right mastectomy in 2000, for a pT2 pN2, stage IIIA invasive ductal carcinoma, estrogen receptor positive, progesterone receptor not tested, HER-2 negative (0) by  immunohistochemistry  (a) adjuvant chemotherapy with doxorubicin and cyclophosphamide in dose dense fashion x4 followed by paclitaxel in dose dense fashion x4  (b) adjuvant radiation: 30 doses  (c) antiestrogens: Tamoxifen for 5 years, completed 2005  (2) METASTASTIC DISEASE: 2008, involving bones, lungs, and lymph nodes  (a) CA 27-29 is informative  (b) CT of the chest abdomen and pelvis in [facility] finds stable sclerotic metastases (T8, T10, T11, sternum, L4, L5); 0.6 cm left upper lobe lung nodule stable  (3)  prior anti-estrogen treatments:  (a) fulvestrant--progression  (b) exemestane/everolimus--hyperlipidemia, hyperglycemia  (4) prior chemotherapy treatments:  (a) capecitabine: progression  (b) Abraxane, August 2014 through 11/18/2015: good response but stopped due to neuropathy  (c) gemcitabine 01/20/2016--?; multiple interruptions secondary to infections  (5) radiation therapy:  (a) T spine and Right femur, completed 01/10/2016  (6) letrozole/ palbociclib started 07/13/2016, discontinued 10/26/2020 with evidence of progression  (7) bone treatment:  (a) denosumab/Xgeva--discontinued January 2016 due to osteonecrosis of the jaw  (8) cancer associated pain:  (a) oxycodone/APAP 10/325 QID as needed  (b) PMP Aware reviewed 05/11/2020  (c) OxyContin 20 mg p.o. twice daily added 09/29/2019  (d) bowel prophylaxis: MiraLAX as needed  (9) restaging studies:  (a) mostly stable bone lesions with evidence of progression at L3-L5; no epidural tumor by total spinal MRI in November 2019  (b) no evidence of progressive lung, lymph node or liver lesions by CT scans of the chest abdomen and pelvis and bone scan December 2019  (c) CT scans abd/pelvis 03/31/2019 shows stable bone metastases, no extraosseous disease  (d) MRI pelvis 04/09/2019 shows bilateral ilium, left acetabulum and lumbar mets, stable  (e) CT abd/pelvis 09/01/2019 shows stable bone mets, slight increase left  effusion  (f) CT of the chest 05/05/2020 again shows minimal increase in the left pleural effusion, possible resorption of bone around the left glenoid lesion, otherwise stable  (g) CT of the chest abdomen and pelvis 10/18/2020 again shows no evidence of visceral disease, but there  is apparent progression in the bone disease, and some enlargement of the left pleural effusion.  The CA 27-29 has also been rising.  Accordingly her letrozole and palbociclib are being discontinued  (10) palliative radiation 11/18/2018 through 12/02/2018 Site/dose:   1. Lumbar spine; 35 Gy in 14 fractions of 2.5 Gy                      2. Pelvis; 35 Gy in 14 fractions of 2.5 Gy  (11) to start cyclophosphamide, methotrexate and fluorouracil [CMF] 11/10/2020  (a) methotrexate to be held during palliative radiation   PLAN: Shalese tolerated her first cycle of CMF (without m) very well.  She has had no issues at this point.    Her pain is much improved with her pain regimen of oxycontin, oxycodone, and Advil.  She will continue this.  PMP aware reviewed, she does not require refills today.  Her pain is also improved with the help of palliative radiation which will be completed towards the end of March.  Her right arm ROM has improved.  Unfortunately, now that her pain has been managed, and she can walk more easily, she is struggling with shortness of breath.  Her physical exam is congruent with left pleural effusion.  She will undergo chest xray today to confirm this.  I have placed orders for an ultrasound guided thoracentesis to be completed in IR.  She knows that this likely won't be able to happen until next week, so if her shortness of breath worsens, she can always go to the ER.    Iyana will return on 12/01/2020 for labs, f/u, and her next treatment.  She knows to call for any questions that may arise between now and her next appointment.  We are happy to see her sooner if needed.   Encounter time 30  minutes.Wilber Bihari, NP 11/18/20 11:54 AM Medical Oncology and Hematology [facility] [place], [place] [address] Tel. [phone]    Fax. [phone]    *Total Encounter Time as defined by the Centers for Medicare and Medicaid Services includes, in addition to the face-to-face time of a patient visit (documented in the note above) non-face-to-face time: obtaining and reviewing outside history, ordering and reviewing medications, tests or procedures, care coordination (communications with other health care professionals or caregivers) and documentation in the medical record.

## 2020-11-21 ENCOUNTER — Other Ambulatory Visit: Payer: Self-pay

## 2020-11-21 ENCOUNTER — Ambulatory Visit (HOSPITAL_COMMUNITY): Payer: MEDICARE

## 2020-11-21 ENCOUNTER — Ambulatory Visit (HOSPITAL_COMMUNITY)
Admission: RE | Admit: 2020-11-21 | Discharge: 2020-11-21 | Disposition: A | Discharge status: Home / Self Care | Payer: MEDICARE | Source: Ambulatory Visit | Attending: Radiology | Admitting: Radiology

## 2020-11-21 ENCOUNTER — Ambulatory Visit (HOSPITAL_COMMUNITY)
Admission: RE | Admit: 2020-11-21 | Discharge: 2020-11-21 | Disposition: A | Discharge status: Home / Self Care | Payer: MEDICARE | Source: Ambulatory Visit | Attending: Adult Health | Admitting: Adult Health

## 2020-11-21 DIAGNOSIS — Z9889 Other specified postprocedural states: Secondary | ICD-10-CM | POA: Insufficient documentation

## 2020-11-21 DIAGNOSIS — C801 Malignant (primary) neoplasm, unspecified: Secondary | ICD-10-CM | POA: Diagnosis not present

## 2020-11-21 DIAGNOSIS — R0602 Shortness of breath: Secondary | ICD-10-CM | POA: Diagnosis not present

## 2020-11-21 DIAGNOSIS — C50919 Malignant neoplasm of unspecified site of unspecified female breast: Secondary | ICD-10-CM | POA: Diagnosis not present

## 2020-11-21 DIAGNOSIS — J90 Pleural effusion, not elsewhere classified: Secondary | ICD-10-CM | POA: Insufficient documentation

## 2020-11-21 DIAGNOSIS — J910 Malignant pleural effusion: Secondary | ICD-10-CM | POA: Diagnosis not present

## 2020-11-21 DIAGNOSIS — C7800 Secondary malignant neoplasm of unspecified lung: Secondary | ICD-10-CM

## 2020-11-21 MED ORDER — LIDOCAINE HCL 1 % IJ SOLN
INTRAMUSCULAR | Status: AC
  Filled 2020-11-21: qty 20

## 2020-11-21 NOTE — Procedures (Addendum)
Ultrasound-guided diagnostic and therapeutic left thoracentesis performed yielding 1.5 liters of hazy, amber fluid. No immediate complications. Follow-up chest x-ray pending. A portion of the fluid was sent to the lab for cytology. EBL< 2 cc. Due to pt chest discomfort/cramping only the above amount of fluid was removed today.

## 2020-11-23 ENCOUNTER — Ambulatory Visit (HOSPITAL_COMMUNITY): Payer: MEDICARE

## 2020-11-24 ENCOUNTER — Other Ambulatory Visit: Payer: Self-pay | Admitting: Oncology

## 2020-11-24 ENCOUNTER — Encounter: Payer: Self-pay | Admitting: Oncology

## 2020-11-24 NOTE — Progress Notes (Signed)
We are requesting a foundation one of the cytology from the thoracentesis 11/21/2020

## 2020-11-24 NOTE — Progress Notes (Signed)
Foundation one request successfully faxed to [phone]

## 2020-11-25 ENCOUNTER — Other Ambulatory Visit: Payer: Self-pay | Admitting: Oncology

## 2020-11-25 LAB — CYTOLOGY - NON PAP

## 2020-11-25 MED ORDER — OXYCODONE HCL ER 20 MG PO T12A: 20.0000 mg | EXTENDED_RELEASE_TABLET | Freq: Three times a day (TID) | ORAL | 0 refills | Status: DC

## 2020-11-28 ENCOUNTER — Other Ambulatory Visit: Payer: Self-pay | Admitting: Oncology

## 2020-11-30 NOTE — Progress Notes (Signed)
[place]  Telephone:([phone] Fax:(336) [phone]    ID: Mia [name] DOB: 09-05-1956  MR#: [id number]  RTM#:[id number]  Patient Care Team: Ronnald Nian, DO as PCP - General (Family Medicine) Makaelah Cranfield, Virgie Dad, MD as Consulting Physician (Oncology) [name], [name] PA   CHIEF COMPLAINT: Estrogen receptor positive stage IV breast cancer (s/p right mastectomy)  CURRENT TREATMENT: CMF chemotherapy;    INTERVAL HISTORY: Mia [name] returns today for follow-up and treatment of her estrogen receptor positive stage IV breast cancer.   Since her last visit, she underwent thoracentesis on 11/21/2020. Cytology from the procedure ([id number]) showed: malignant cells consistent with metastatic adenocarcinoma. Prognostic panel significant for: estrogen and progesterone receptor 95% positive, both with strong staining intensity; Her2 equivocal by immunohistochemistry but negative by FISH.  She received her first cycle of CMF on 11/11/2020 and tolerated this well.  She did not receive Methotrexate because she has been undergoing radiation therapy for her right painful bony metastases. Today is cycle 2 day 1.  She completed palliative radiation on 11/18/2020.  She tolerated this generally well although she has an area of skin breakdown in the armpit she wanted me to look at.   REVIEW OF SYSTEMS: Mia [name] tells me since she had her thoracentesis she is breathing much better.  She has minimal cough when she wakes up in the morning but not otherwise.  She feels she can take a breath all the way down without pleurisy.  Her right upper extremity is very limited in what it can accomplished.  The pain however is slightly better and it is well controlled on her current treatment program which is detailed below.  She has no constipation from the narcotics.  She did very well with her first CMF cycle.  Her functional status is limited.  Mostly she says she sits  and watches TV during the day.  Her husband does the shopping cooking and housework.  A detailed review of systems today was otherwise stable   COVID 19 VACCINATION STATUS: [place] x2, with the booster 08/16/2020   HISTORY OF CURRENT ILLNESS: From the original intake note:  We have reviewed the medical records from [place], which is the source of the information below:  Mia [name] was initially diagnosed with Stage IIIA (T2N2M0) invasive ductal carcinoma, estrogen receptor positive and HER2 negative right breast cancer in 2000. She underwent right mastectomy, with 4 positive metastatic lymph nodes. She had chemotherapy with taxol and cytoxan and fulvestrant in the past. She had radiation and completed 5 years of tamoxifen.   She was diagnosed with Stage IV disease 04/18/2005 with metastases to the bone, lung, and additional nodes. She was on capecitabine but was discontinued after rising CA 27-29 and scans.   She started anastrozole and everolimus with Delton See in April 2014. She tolerated this well, but this was discontinued in July 2014 due to abnormal blood tests, hyperlipidemia and hyperglycemia.  She began Abraxane 124m /m2 and Xgeva in August 2014 weekly x3 with 1 week off. She tolerated this treatment well. She discontinued Xgeva January 2016 due to osteonecrosis of the jaw and mouth issues. She continued abraxane alone until her last dose on 11/18/2015 due to neuropathy and disease progression with restaging studies on 11/23/2015 with a CT chest abdomen and pelvis and one scan showing skull, sternal and femoral lesions.   She was switched to gemcitabine starting 01/20/2016 weekly x3 and 1 week off. This was discontinued on 06/15/2016 due to a port  related jugular clot on the right side. She was given Lovenox for 3 months.  She was switched to Letrozole 2.5 mg and palbociclib 125 mg starting 07/13/2016. She tolerated this well. Restaging studies with CT chest abdomen and  pelvis and bone scan on 07/01/2017 showed stable/ improving lesions. CA 27-29 was stable.  During the last few months of follow up in [place], she completed restaging studies with a CT chest abdomen and pelvis at [facility] on 12/17/2017 showing: A 6 mm pulmonary nodule in the left upper lobe laterally. Progression of metastatic disease to the bones at the L4 and L5 vertebral bodies. Sclerotic metastases at T8, T10, an T11, are similar to previous exams. Sclerotic metastases in the sternum stable. Hepatic steatosis. Aortic atherosclerosis.  Most recent CA-83-29 (January 2019) was 58.  Most recent hemoglobin A1c was 7.1 according to the patient  The patient's subsequent history is as detailed below.   PAST MEDICAL HISTORY: Past Medical History:  Diagnosis Date  . Breast cancer metastasized to bone ([place])   . Diabetes mellitus without complication ([place])   . Hyperlipidemia   . Hypertension   . Lymphedema of right arm   . Neuropathy associated with cancer ([place])   . Obesity (BMI 35.0-39.9 without comorbidity)      PAST SURGICAL HISTORY: Past Surgical History:  Procedure Laterality Date  . CATARACT EXTRACTION Left   . HYSTERECTOMY ABDOMINAL WITH SALPINGO-OOPHORECTOMY    . MODIFIED RADICAL MASTECTOMY Right      FAMILY HISTORY: Family History  Problem Relation Age of Onset  . Diabetes Mother   . Breast cancer Mother 63  . Cerebral aneurysm Mother   . Pulmonary embolism Father   . Colon cancer Father 71  . Breast cancer Sister 3       noninvasive  . Breast cancer Paternal Grandmother 78   The patient reports she had negative genetic testing at [facility] 2014, report not available. --The patient's father died at age 23 due to a PE, and he also had a history of colon cancer diagnosed at age 62. The patient's mother died at age 21 due to diabetes and survived a cerebral aneurysm. The patient's mother also had a history of breast cancer diagnosed at age 56.  The patient has no  brothers and 1 sister. The patient's sister was diagnosed with non invasive breast cancer at age 55. There was a paternal grandmother diagnosed with breast cancer at age 1. The patient denies a family history of ovarian cancer.    GYNECOLOGIC HISTORY:  Patient's last menstrual period was 10/01/2006. Menarche: 65 years old Age at first live birth: 65 years old She is [place] P2.  She is status post hysterectomy with bilateral salpingo- oophorectomy 12/23/2006 with benign pathology ([id number]) She never used HRT.    SOCIAL HISTORY:  Mia [name] is disabled due to her breast cancer. She used to be Surveyor, quantity for a cardiology office. Her husband, [name] is in the process of retiring. He is a Electrical engineer for a power company. The patient's daughter, [name], lives in [place] and works as a Teaching laboratory technician. The patient's second daughter, [name] is a stay at home mom and is a special needs teacher, who will soon move to [facility] [place]). The patient plans on attending [employer].     ADVANCED DIRECTIVES: In the absence of any documentation to the contrary, the patient's spouse is their HCPOA.    HEALTH MAINTENANCE: Social History   Tobacco Use  .  Smoking status: Never Smoker  . Smokeless tobacco: Never Used  Vaping Use  . Vaping Use: Never used  Substance Use Topics  . Alcohol use: Not Currently  . Drug use: Never    Colonoscopy: 2017   PAP:  Bone density: never  Mammogram: 2017   Allergies  Allergen Reactions  . Morphine And Related Anaphylaxis    Current Outpatient Medications  Medication Sig Dispense Refill  . amLODipine (NORVASC) 10 MG tablet Take 10 mg by mouth daily.    Marland Kitchen aspirin 81 MG chewable tablet Chew by mouth daily.    . fenofibrate (TRICOR) 145 MG tablet Take 1 tablet (145 mg total) by mouth daily. 90 tablet 3  . fluticasone (FLONASE) 50 MCG/ACT nasal spray SPRAY 2 SPRAYS INTO EACH NOSTRIL EVERY DAY 48 mL 1  . gabapentin  (NEURONTIN) 300 MG capsule Take 1 capsule (300 mg total) by mouth 4 (four) times daily. Pt is only taking 2 capsules a day 180 capsule 0  . ibuprofen (ADVIL) 800 MG tablet Take 1 tablet (800 mg total) by mouth every 8 (eight) hours as needed. 30 tablet 0  . Insulin Admin Supplies MISC Inject 24 Units into the skin daily.    . insulin glargine (LANTUS SOLOSTAR) 100 UNIT/ML Solostar Pen INJECT 24 UNITS INTO THE SKIN DAILY 15 mL 1  . Insulin Pen Needle 31G X 5 MM MISC UAD for Sq inj for DM qd 100 each PRN  . losartan (COZAAR) 50 MG tablet Take 1 tablet (50 mg total) by mouth 2 (two) times daily. 180 tablet 0  . metoprolol succinate (TOPROL-XL) 25 MG 24 hr tablet TAKE 1 TABLET BY MOUTH EVERY DAY 90 tablet 1  . oxyCODONE (OXY IR/ROXICODONE) 5 MG immediate release tablet Take 1-2 tablets (5-10 mg total) by mouth every 4 (four) hours as needed for severe pain. 60 tablet 0  . oxyCODONE (OXYCONTIN) 20 mg 12 hr tablet Take 1 tablet (20 mg total) by mouth every 8 (eight) hours. 90 tablet 0  . pantoprazole (PROTONIX) 40 MG tablet TAKE 1 TABLET BY MOUTH EVERY DAY 90 tablet 0  . pioglitazone (ACTOS) 15 MG tablet TAKE 1 TABLET BY MOUTH EVERY DAY 90 tablet 1  . pravastatin (PRAVACHOL) 20 MG tablet Take 1 tablet (20 mg total) by mouth daily. 90 tablet 3  . prochlorperazine (COMPAZINE) 10 MG tablet Take 1 tablet (10 mg total) by mouth every 6 (six) hours as needed (Nausea or vomiting). 30 tablet 1  . sitaGLIPtin-metformin (JANUMET) 50-1000 MG tablet Take 1 tablet by mouth daily with breakfast. 90 tablet 1   No current facility-administered medications for this visit.    OBJECTIVE: White woman examined in a wheelchair Vitals:   12/01/20 0947  BP: 117/65  Pulse: 100  Resp: 18  Temp: (!) 97.3 F (36.3 C)  SpO2: 95%   Wt Readings from Last 3 Encounters:  12/01/20 215 lb 6.4 oz (97.7 kg)  11/18/20 219 lb 12.8 oz (99.7 kg)  11/11/20 209 lb 12.8 oz (95.2 kg)   Body mass index is 36.97 kg/m.   ECOG FS:2 -  Symptomatic, <50% confined to bed  Sclerae unicteric, EOMs intact Wearing a mask Lungs no rales or rhonchi Heart regular rate and rhythm Abd soft, nontender, positive bowel sounds MSK no focal spinal tenderness Neuro: nonfocal, well oriented, appropriate affect Breasts: Deferred Skin: There is a very shallow area of desquamation in the right axilla, imaged below.  12/01/2020    LAB RESULTS:  CMP   No  results found for: TOTALPROTELP, ALBUMINELP, A1GS, A2GS, BETS, BETA2SER, GAMS, MSPIKE, SPEI  No results found for: KPAFRELGTCHN, LAMBDASER, [facility]  Lab Results  Component Value Date   WBC 6.8 12/01/2020   NEUTROABS PENDING 12/01/2020   HGB 9.6 (L) 12/01/2020   HCT 30.0 (L) 12/01/2020   MCV 102.0 (H) 12/01/2020   PLT 467 (H) 12/01/2020    No results found for: LABCA2  No components found for: [id number]  No results for input(s): INR in the last 168 hours.  No results found for: LABCA2  No results found for: YQM578  No results found for: ION629  No results found for: BMW413  Lab Results  Component Value Date   CA2729 297.9 (H) 11/11/2020    No components found for: HGQUANT  No results found for: CEA1 / No results found for: CEA1   No results found for: AFPTUMOR  No results found for: CHROMOGRNA  No results found for: HGBA, HGBA2QUANT, HGBFQUANT, HGBSQUAN (Hemoglobinopathy evaluation)   No results found for: LDH  No results found for: IRON, TIBC, IRONPCTSAT (Iron and TIBC)  No results found for: FERRITIN  Urinalysis No results found for: COLORURINE, APPEARANCEUR, LABSPEC, PHURINE, GLUCOSEU, HGBUR, BILIRUBINUR, KETONESUR, PROTEINUR, UROBILINOGEN, NITRITE, LEUKOCYTESUR   STUDIES: Refuses mammography at this point (12/23/2019)   ELIGIBLE FOR AVAILABLE RESEARCH PROTOCOL: no   ASSESSMENT: 65 y.o. [place], [place] woman with stage IV breast cancer, as follows:  (1) status post right mastectomy in 2000, for a pT2 pN2, stage IIIA invasive ductal  carcinoma, estrogen receptor positive, progesterone receptor not tested, HER-2 negative (0) by immunohistochemistry  (a) adjuvant chemotherapy with doxorubicin and cyclophosphamide in dose dense fashion x4 followed by paclitaxel in dose dense fashion x4  (b) adjuvant radiation: 30 doses  (c) antiestrogens: Tamoxifen for 5 years, completed 2005  (2) METASTASTIC DISEASE: 2008, involving bones, lungs, and lymph nodes  (a) CA 27-29 is informative  (b) CT of the chest abdomen and pelvis in [facility] finds stable sclerotic metastases (T8, T10, T11, sternum, L4, L5); 0.6 cm left upper lobe lung nodule stable  (3)  prior anti-estrogen treatments:  (a) fulvestrant--progression  (b) exemestane/everolimus--hyperlipidemia, hyperglycemia  (4) prior chemotherapy treatments:  (a) capecitabine: progression  (b) Abraxane, August 2014 through 11/18/2015: good response but stopped due to neuropathy  (c) gemcitabine 01/20/2016--?; multiple interruptions secondary to infections  (5) radiation therapy:  (a) T spine and Right femur, completed 01/10/2016  (6) letrozole/ palbociclib started 07/13/2016, discontinued 10/26/2020 with evidence of progression  (7) bone treatment:  (a) denosumab/Xgeva--discontinued January 2016 due to osteonecrosis of the jaw  (8) cancer associated pain: Updated 12/01/2020 (a) OxyIR 5 mg QID PRN  (b) PMP Aware reviewed 12/01/2020  (c) OxyContin 20 mg p.o. 3 times daily  (d) bowel prophylaxis: MiraLAX as needed  (9) restaging studies:  (a) mostly stable bone lesions with evidence of progression at L3-L5; no epidural tumor by total spinal MRI in November 2019  (b) no evidence of progressive lung, lymph node or liver lesions by CT scans of the chest abdomen and pelvis and bone scan December 2019  (c) CT scans abd/pelvis 03/31/2019 shows stable bone metastases, no extraosseous disease  (d) MRI pelvis 04/09/2019 shows bilateral ilium, left acetabulum and lumbar mets,  stable  (e) CT abd/pelvis 09/01/2019 shows stable bone mets, slight increase left effusion  (f) CT of the chest 05/05/2020 again shows minimal increase in the left pleural effusion, possible resorption of bone around the left glenoid lesion, otherwise stable  (g) CT of the  chest abdomen and pelvis 10/18/2020 again shows no evidence of visceral disease, but there is apparent progression in the bone disease, and some enlargement of the left pleural effusion.  The CA 27-29 has also been rising.  Accordingly her letrozole and palbociclib are being discontinued  (10) palliative radiation 11/18/2018 through 12/02/2018 Site/dose:   1. Lumbar spine; 35 Gy in 14 fractions of 2.5 Gy                      2. Pelvis; 35 Gy in 14 fractions of 2.5 Gy  (11) started cyclophosphamide, methotrexate and fluorouracil [CMF] 11/10/2020  (a) methotrexate held first dose, during palliative radiation  (12) status post left thoracentesis 11/21/2020, with positive cytology  (a) tumor cells are strongly estrogen and progesterone receptor positive, HER-2 negative  (b) foundation 1 requested on the sample  PLAN: Mia [name] is clinically significantly improved after her thoracentesis.  She also has somewhat less pain after her radiation.  Aside from the right arm, her pain is well controlled on her current medications and she is very stable on the doses.  She will receive her second cycle of CMF today.  The plan is to continue that a minimum of 4 times and then reassess.  We are adding back methotrexate which we held cycle 1 because of concurrent radiation  I think she will benefit from physical therapy and I have placed the appropriate referral.  If her breathing again worsens of course we can repeat the thoracentesis and we can place a Pleurx if necessary  She will see me again with cycle #3.  She knows to call for any other issue that may develop before the next visit  Total encounter time 35 minutes.Sarajane Jews C.  Ayomide Zuleta, MD 12/01/20 9:54 AM Medical Oncology and Hematology [facility] [place], [place] [address] Tel. [phone]    Fax. [phone]   I, Wilburn Mylar, am acting as scribe for Dr. Virgie Dad. Kameran Lallier.  I, Lurline Del MD, have reviewed the above documentation for accuracy and completeness, and I agree with the above.    *Total Encounter Time as defined by the Centers for Medicare and Medicaid Services includes, in addition to the face-to-face time of a patient visit (documented in the note above) non-face-to-face time: obtaining and reviewing outside history, ordering and reviewing medications, tests or procedures, care coordination (communications with other health care professionals or caregivers) and documentation in the medical record.

## 2020-12-01 ENCOUNTER — Inpatient Hospital Stay (HOSPITAL_BASED_OUTPATIENT_CLINIC_OR_DEPARTMENT_OTHER): Payer: MEDICARE | Admitting: Oncology

## 2020-12-01 ENCOUNTER — Inpatient Hospital Stay: Payer: MEDICARE

## 2020-12-01 ENCOUNTER — Inpatient Hospital Stay: Payer: MEDICARE | Attending: Oncology

## 2020-12-01 ENCOUNTER — Other Ambulatory Visit: Payer: Self-pay

## 2020-12-01 VITALS — BP 117/65 | HR 100 | Temp 97.3°F | Resp 18 | Ht 64.0 in | Wt 215.4 lb

## 2020-12-01 DIAGNOSIS — E785 Hyperlipidemia, unspecified: Secondary | ICD-10-CM | POA: Insufficient documentation

## 2020-12-01 DIAGNOSIS — Z5111 Encounter for antineoplastic chemotherapy: Secondary | ICD-10-CM | POA: Diagnosis not present

## 2020-12-01 DIAGNOSIS — C50911 Malignant neoplasm of unspecified site of right female breast: Secondary | ICD-10-CM | POA: Diagnosis not present

## 2020-12-01 DIAGNOSIS — Z794 Long term (current) use of insulin: Secondary | ICD-10-CM | POA: Insufficient documentation

## 2020-12-01 DIAGNOSIS — Z79899 Other long term (current) drug therapy: Secondary | ICD-10-CM | POA: Insufficient documentation

## 2020-12-01 DIAGNOSIS — C7951 Secondary malignant neoplasm of bone: Secondary | ICD-10-CM | POA: Insufficient documentation

## 2020-12-01 DIAGNOSIS — G629 Polyneuropathy, unspecified: Secondary | ICD-10-CM | POA: Diagnosis not present

## 2020-12-01 DIAGNOSIS — Z923 Personal history of irradiation: Secondary | ICD-10-CM | POA: Insufficient documentation

## 2020-12-01 DIAGNOSIS — G893 Neoplasm related pain (acute) (chronic): Secondary | ICD-10-CM | POA: Diagnosis not present

## 2020-12-01 DIAGNOSIS — K5900 Constipation, unspecified: Secondary | ICD-10-CM | POA: Insufficient documentation

## 2020-12-01 DIAGNOSIS — Z803 Family history of malignant neoplasm of breast: Secondary | ICD-10-CM | POA: Diagnosis not present

## 2020-12-01 DIAGNOSIS — E119 Type 2 diabetes mellitus without complications: Secondary | ICD-10-CM | POA: Diagnosis not present

## 2020-12-01 DIAGNOSIS — C50919 Malignant neoplasm of unspecified site of unspecified female breast: Secondary | ICD-10-CM

## 2020-12-01 DIAGNOSIS — M879 Osteonecrosis, unspecified: Secondary | ICD-10-CM

## 2020-12-01 DIAGNOSIS — K760 Fatty (change of) liver, not elsewhere classified: Secondary | ICD-10-CM | POA: Diagnosis not present

## 2020-12-01 DIAGNOSIS — I700 Atherosclerosis of aorta: Secondary | ICD-10-CM | POA: Diagnosis not present

## 2020-12-01 DIAGNOSIS — C7800 Secondary malignant neoplasm of unspecified lung: Secondary | ICD-10-CM

## 2020-12-01 DIAGNOSIS — C773 Secondary and unspecified malignant neoplasm of axilla and upper limb lymph nodes: Secondary | ICD-10-CM | POA: Diagnosis not present

## 2020-12-01 DIAGNOSIS — Z170 Estrogen receptor positive status [ER+]: Secondary | ICD-10-CM | POA: Insufficient documentation

## 2020-12-01 LAB — COMPREHENSIVE METABOLIC PANEL
ALT: 9 U/L (ref 0–44)
AST: 22 U/L (ref 15–41)
Albumin: 2.8 g/dL — ABNORMAL LOW (ref 3.5–5.0)
Alkaline Phosphatase: 109 U/L (ref 38–126)
Anion gap: 9 (ref 5–15)
BUN: 19 mg/dL (ref 8–23)
CO2: 27 mmol/L (ref 22–32)
Calcium: 9.5 mg/dL (ref 8.9–10.3)
Chloride: 100 mmol/L (ref 98–111)
Creatinine, Ser: 1.34 mg/dL — ABNORMAL HIGH (ref 0.44–1.00)
GFR, Estimated: 44 mL/min — ABNORMAL LOW (ref >60)
Glucose, Bld: 104 mg/dL — ABNORMAL HIGH (ref 70–99)
Potassium: 4.4 mmol/L (ref 3.5–5.1)
Sodium: 136 mmol/L (ref 135–145)
Total Bilirubin: 0.3 mg/dL (ref 0.3–1.2)
Total Protein: 6.5 g/dL (ref 6.5–8.1)

## 2020-12-01 LAB — CBC WITH DIFFERENTIAL/PLATELET
Abs Immature Granulocytes: 1.41 10*3/uL — ABNORMAL HIGH (ref 0.00–0.07)
Basophils Absolute: 0.1 10*3/uL (ref 0.0–0.1)
Basophils Relative: 1 %
Eosinophils Absolute: 0.1 10*3/uL (ref 0.0–0.5)
Eosinophils Relative: 2 %
HCT: 30 % — ABNORMAL LOW (ref 36.0–46.0)
Hemoglobin: 9.6 g/dL — ABNORMAL LOW (ref 12.0–15.0)
Immature Granulocytes: 21 %
Lymphocytes Relative: 7 %
Lymphs Abs: 0.5 10*3/uL — ABNORMAL LOW (ref 0.7–4.0)
MCH: 32.7 pg (ref 26.0–34.0)
MCHC: 32 g/dL (ref 30.0–36.0)
MCV: 102 fL — ABNORMAL HIGH (ref 80.0–100.0)
Monocytes Absolute: 0.9 10*3/uL (ref 0.1–1.0)
Monocytes Relative: 13 %
Neutro Abs: 3.9 10*3/uL (ref 1.7–7.7)
Neutrophils Relative %: 56 %
Platelets: 467 10*3/uL — ABNORMAL HIGH (ref 150–400)
RBC: 2.94 MIL/uL — ABNORMAL LOW (ref 3.87–5.11)
RDW: 16 % — ABNORMAL HIGH (ref 11.5–15.5)
WBC: 6.8 10*3/uL (ref 4.0–10.5)
nRBC: 0.4 % — ABNORMAL HIGH (ref 0.0–0.2)

## 2020-12-01 MED ORDER — OXYCODONE HCL ER 20 MG PO T12A: 20.0000 mg | EXTENDED_RELEASE_TABLET | Freq: Three times a day (TID) | ORAL | 0 refills | Status: DC

## 2020-12-01 MED ORDER — SODIUM CHLORIDE 0.9 % IV SOLN
600.0000 mg/m2 | Freq: Once | INTRAVENOUS | Status: AC
  Administered 2020-12-01: 1260 mg via INTRAVENOUS
  Filled 2020-12-01: qty 63

## 2020-12-01 MED ORDER — SODIUM CHLORIDE 0.9 % IV SOLN
Freq: Once | INTRAVENOUS | Status: AC
  Filled 2020-12-01: qty 250

## 2020-12-01 MED ORDER — ACETAMINOPHEN 325 MG PO TABS
ORAL_TABLET | ORAL | Status: AC
  Filled 2020-12-01: qty 2

## 2020-12-01 MED ORDER — PALONOSETRON HCL INJECTION 0.25 MG/5ML
INTRAVENOUS | Status: AC
  Filled 2020-12-01: qty 5

## 2020-12-01 MED ORDER — SODIUM CHLORIDE 0.9% FLUSH
10.0000 mL | INTRAVENOUS | Status: DC | PRN
  Filled 2020-12-01: qty 10

## 2020-12-01 MED ORDER — FLUOROURACIL CHEMO INJECTION 2.5 GM/50ML
600.0000 mg/m2 | Freq: Once | INTRAVENOUS | Status: AC
  Administered 2020-12-01: 1250 mg via INTRAVENOUS
  Filled 2020-12-01: qty 25

## 2020-12-01 MED ORDER — SODIUM CHLORIDE 0.9 % IV SOLN
10.0000 mg | Freq: Once | INTRAVENOUS | Status: AC
  Administered 2020-12-01: 10 mg via INTRAVENOUS
  Filled 2020-12-01: qty 10

## 2020-12-01 MED ORDER — METHOTREXATE SODIUM (PF) CHEMO INJECTION 250 MG/10ML
40.0000 mg/m2 | Freq: Once | INTRAMUSCULAR | Status: AC
  Administered 2020-12-01: 84 mg via INTRAVENOUS
  Filled 2020-12-01: qty 3.36

## 2020-12-01 MED ORDER — PALONOSETRON HCL INJECTION 0.25 MG/5ML
0.2500 mg | Freq: Once | INTRAVENOUS | Status: AC
  Administered 2020-12-01: 0.25 mg via INTRAVENOUS

## 2020-12-01 NOTE — Addendum Note (Signed)
Addended by: Chauncey Cruel on: 12/01/2020 10:17 AM   Modules accepted: Orders

## 2020-12-01 NOTE — Patient Instructions (Signed)
[facility] Discharge Instructions for Patients Receiving Chemotherapy  Today you received the following chemotherapy agents Cyclophosfamide, Methotrexate, Florouricil  To help prevent nausea and vomiting after your treatment, we encourage you to take your nausea medication as directed.   If you develop nausea and vomiting that is not controlled by your nausea medication, call the clinic.   BELOW ARE SYMPTOMS THAT SHOULD BE REPORTED IMMEDIATELY:  *FEVER GREATER THAN 100.5 F  *CHILLS WITH OR WITHOUT FEVER  NAUSEA AND VOMITING THAT IS NOT CONTROLLED WITH YOUR NAUSEA MEDICATION  *UNUSUAL SHORTNESS OF BREATH  *UNUSUAL BRUISING OR BLEEDING  TENDERNESS IN MOUTH AND THROAT WITH OR WITHOUT PRESENCE OF ULCERS  *URINARY PROBLEMS  *BOWEL PROBLEMS  UNUSUAL RASH Items with * indicate a potential emergency and should be followed up as soon as possible.  Feel free to call the clinic should you have any questions or concerns. The clinic phone number is [phone].  Please show the [facility] at check-in to the Emergency Department and triage nurse.

## 2020-12-02 ENCOUNTER — Telehealth: Payer: Self-pay | Admitting: Oncology

## 2020-12-02 LAB — CANCER ANTIGEN 27.29: CA 27.29: 239.4 U/mL — ABNORMAL HIGH (ref 0.0–38.6)

## 2020-12-02 NOTE — Telephone Encounter (Signed)
Scheduled appts per 3/3 los. Unable to leave voicemail. Pt to get updated appt calendar at next visit per appt notes.

## 2020-12-06 ENCOUNTER — Other Ambulatory Visit: Payer: Self-pay | Admitting: Oncology

## 2020-12-06 DIAGNOSIS — C50919 Malignant neoplasm of unspecified site of unspecified female breast: Secondary | ICD-10-CM | POA: Diagnosis not present

## 2020-12-06 DIAGNOSIS — C7800 Secondary malignant neoplasm of unspecified lung: Secondary | ICD-10-CM | POA: Diagnosis not present

## 2020-12-06 MED ORDER — OXYCODONE HCL 5 MG PO TABS: 5.0000 mg | ORAL_TABLET | ORAL | 0 refills | Status: DC | PRN

## 2020-12-07 ENCOUNTER — Encounter (HOSPITAL_COMMUNITY): Payer: Self-pay | Admitting: Oncology

## 2020-12-13 ENCOUNTER — Encounter: Payer: Self-pay | Admitting: Radiology

## 2020-12-19 ENCOUNTER — Ambulatory Visit: Payer: Self-pay | Admitting: Radiation Oncology

## 2020-12-20 ENCOUNTER — Encounter: Payer: Self-pay | Admitting: Family Medicine

## 2020-12-20 MED ORDER — LOSARTAN POTASSIUM 50 MG PO TABS: 50.0000 mg | ORAL_TABLET | Freq: Two times a day (BID) | ORAL | 0 refills | Status: DC

## 2020-12-21 ENCOUNTER — Other Ambulatory Visit: Payer: Self-pay | Admitting: Family Medicine

## 2020-12-21 DIAGNOSIS — E785 Hyperlipidemia, unspecified: Secondary | ICD-10-CM

## 2020-12-21 MED ORDER — LOSARTAN POTASSIUM 50 MG PO TABS
50.0000 mg | ORAL_TABLET | Freq: Two times a day (BID) | ORAL | 3 refills | Status: DC
Start: 2020-12-21 — End: 2021-05-02

## 2020-12-21 NOTE — Progress Notes (Signed)
[place]  [phone] [phone]    ID: Mia [name] DOB: 1955/12/17  MR#: [id number]  PPI#:[id number]  Patient Care Team: Ronnald Nian, DO as PCP - General (Family Medicine) Lion Fernandez, Virgie Dad, MD as Consulting Physician (Oncology) [name], [name] PA   CHIEF COMPLAINT: Estrogen receptor positive stage IV breast cancer (s/p right mastectomy)  CURRENT TREATMENT: CMF chemotherapy;    INTERVAL HISTORY: Mia [name] returns today for follow-up and treatment of her estrogen receptor positive stage IV breast cancer.   She started CMF on 11/11/2020 and tolerated this well.  She has had no acute side effects from this that she has to report.  Today is cycle 3 day 1.   REVIEW OF SYSTEMS: Mia [name] continues to have significant pain in the right shoulder area.  She wonders if there is something going on there that could be addressed as the radiation did not help very much.  Overall the pain is moderately well controlled on her current medications and she has an adequate bowel prophylaxis regimen.  A detailed review of systems today was otherwise stable   COVID 19 VACCINATION STATUS: [place] x2, with the booster 08/16/2020   HISTORY OF CURRENT ILLNESS: From the original intake note:  We have reviewed the medical records from [facility], which is the source of the information below:  Mia [name] was initially diagnosed with Stage IIIA (T2N2M0) invasive ductal carcinoma, estrogen receptor positive and HER2 negative right breast cancer in 2000. She underwent right mastectomy, with 4 positive metastatic lymph nodes. She had chemotherapy with taxol and cytoxan and fulvestrant in the past. She had radiation and completed 5 years of tamoxifen.   She was diagnosed with Stage IV disease 04/18/2005 with metastases to the bone, lung, and additional nodes. She was on capecitabine but was discontinued after rising CA 27-29 and  scans.   She started anastrozole and everolimus with Delton See in April 2014. She tolerated this well, but this was discontinued in July 2014 due to abnormal blood tests, hyperlipidemia and hyperglycemia.  She began Abraxane 128m /m2 and Xgeva in August 2014 weekly x3 with 1 week off. She tolerated this treatment well. She discontinued Xgeva January 2016 due to osteonecrosis of the jaw and mouth issues. She continued abraxane alone until her last dose on 11/18/2015 due to neuropathy and disease progression with restaging studies on 11/23/2015 with a CT chest abdomen and pelvis and one scan showing skull, sternal and femoral lesions.   She was switched to gemcitabine starting 01/20/2016 weekly x3 and 1 week off. This was discontinued on 06/15/2016 due to a port related jugular clot on the right side. She was given Lovenox for 3 months.  She was switched to Letrozole 2.5 mg and palbociclib 125 mg starting 07/13/2016. She tolerated this well. Restaging studies with CT chest abdomen and pelvis and bone scan on 07/01/2017 showed stable/ improving lesions. CA 27-29 was stable.  During the last few months of follow up in [place] she completed restaging studies [facility] showing: A 6 mm pulmonary nodule in the left upper lobe laterally. Progression of metastatic disease to the bones at the L4 and L5 vertebral bodies. Sclerotic metastases at T8, T10, an T11, are similar to previous exams. Sclerotic metastases in the sternum stable. Hepatic steatosis. Aortic atherosclerosis.  Most recent CCA-29-29(January 2019) was 58.  Most recent hemoglobin A1c was 7.1 according to the patient  The patient's  subsequent history is as detailed below.   PAST MEDICAL HISTORY: Past Medical History:  Diagnosis Date  . Breast cancer metastasized to bone ([place])   . Diabetes mellitus without complication ([place])   . History of radiation therapy 11/18/2018-12/02/2018   lumbar spine and  pelvis   Dr [name]  . History of radiation therapy 11/08/2020-11/18/2020   right arm   Dr [name]  . Hyperlipidemia   . Hypertension   . Lymphedema of right arm   . Neuropathy associated with cancer ([place])   . Obesity (BMI 35.0-39.9 without comorbidity)      PAST SURGICAL HISTORY: Past Surgical History:  Procedure Laterality Date  . CATARACT EXTRACTION Left   . HYSTERECTOMY ABDOMINAL WITH SALPINGO-OOPHORECTOMY    . MODIFIED RADICAL MASTECTOMY Right      FAMILY HISTORY: Family History  Problem Relation Age of Onset  . Diabetes Mother   . Breast cancer Mother 65  . Cerebral aneurysm Mother   . Pulmonary embolism Father   . Colon cancer Father 48  . Breast cancer Sister 91       noninvasive  . Breast cancer Paternal Grandmother 78   The patient reports she had negative genetic testing at [facility] 2014, report not available. --The patient's father died at age 65 due to a PE, and he also had a history of colon cancer diagnosed at age 65. The patient's mother died at age 65 due to diabetes and survived a cerebral aneurysm. The patient's mother also had a history of breast cancer diagnosed at age 65.  The patient has no brothers and 1 sister. The patient's sister was diagnosed with non invasive breast cancer at age 65. There was a paternal grandmother diagnosed with breast cancer at age 65. The patient denies a family history of ovarian cancer.    GYNECOLOGIC HISTORY:  Patient's last menstrual period was 10/01/2006. Menarche: 65 years old Age at first live birth: 65 years old She is [place] P2.  She is status post hysterectomy with bilateral salpingo- oophorectomy 12/23/2006 with benign pathology ([id number]) She never used HRT.    SOCIAL HISTORY:  Mia [name] is disabled due to her breast cancer. She used to be Surveyor, quantity for a cardiology office. Her husband, [name] is in the process of retiring. He is a Electrical engineer for a power company. The patient's  daughter, Mia [name], lives in [place] and works as a Teaching laboratory technician. The patient's second daughter, Mia [name] is a stay at home mom and is a special needs teacher, who will soon move to [facility]). The patient plans on attending [employer].     ADVANCED DIRECTIVES: In the absence of any documentation to the contrary, the patient's spouse is their HCPOA.    HEALTH MAINTENANCE: Social History   Tobacco Use  . Smoking status: Never Smoker  . Smokeless tobacco: Never Used  Vaping Use  . Vaping Use: Never used  Substance Use Topics  . Alcohol use: Not Currently  . Drug use: Never    Colonoscopy: 2017   PAP:  Bone density: never  Mammogram: 2017   Allergies  Allergen Reactions  . Morphine And Related Anaphylaxis    Current Outpatient Medications  Medication Sig Dispense Refill  . amLODipine (NORVASC) 10 MG tablet Take 10 mg by mouth daily.    Marland Kitchen aspirin 81 MG chewable tablet Chew by mouth daily.    . fenofibrate (TRICOR) 145 MG tablet TAKE 1 TABLET BY MOUTH EVERY DAY 90 tablet  2  . fluticasone (FLONASE) 50 MCG/ACT nasal spray SPRAY 2 SPRAYS INTO EACH NOSTRIL EVERY DAY 48 mL 1  . gabapentin (NEURONTIN) 300 MG capsule Take 1 capsule (300 mg total) by mouth 4 (four) times daily. Pt is only taking 2 capsules a day 180 capsule 0  . ibuprofen (ADVIL) 800 MG tablet Take 1 tablet (800 mg total) by mouth every 8 (eight) hours as needed. 30 tablet 0  . Insulin Admin Supplies MISC Inject 24 Units into the skin daily.    . insulin glargine (LANTUS SOLOSTAR) 100 UNIT/ML Solostar Pen INJECT 24 UNITS INTO THE SKIN DAILY 15 mL 1  . Insulin Pen Needle 31G X 5 MM MISC UAD for Sq inj for DM qd 100 each PRN  . losartan (COZAAR) 50 MG tablet Take 1 tablet (50 mg total) by mouth 2 (two) times daily. 180 tablet 3  . metoprolol succinate (TOPROL-XL) 25 MG 24 hr tablet TAKE 1 TABLET BY MOUTH EVERY DAY 90 tablet 1  . oxyCODONE (OXY IR/ROXICODONE) 5 MG immediate release  tablet Take 1-2 tablets (5-10 mg total) by mouth every 4 (four) hours as needed for severe pain. 60 tablet 0  . oxyCODONE (OXYCONTIN) 20 mg 12 hr tablet Take 1 tablet (20 mg total) by mouth every 8 (eight) hours. 90 tablet 0  . pantoprazole (PROTONIX) 40 MG tablet TAKE 1 TABLET BY MOUTH EVERY DAY 90 tablet 0  . pioglitazone (ACTOS) 15 MG tablet TAKE 1 TABLET BY MOUTH EVERY DAY 90 tablet 1  . pravastatin (PRAVACHOL) 20 MG tablet TAKE 1 TABLET BY MOUTH EVERY DAY 90 tablet 2  . prochlorperazine (COMPAZINE) 10 MG tablet Take 1 tablet (10 mg total) by mouth every 6 (six) hours as needed (Nausea or vomiting). 30 tablet 1  . sitaGLIPtin-metformin (JANUMET) 50-1000 MG tablet Take 1 tablet by mouth daily with breakfast. 90 tablet 1   No current facility-administered medications for this visit.    OBJECTIVE: White woman using a cane Vitals:   12/22/20 0900  BP: (!) 102/48  [name]: 95  Resp: 18  Temp: (!) 97.2 F (36.2 C)  SpO2: 95%   Wt Readings from Last 3 Encounters:  12/01/20 215 lb 6.4 oz (97.7 kg)  11/18/20 219 lb 12.8 oz (99.7 kg)  11/11/20 209 lb 12.8 oz (95.2 kg)   Body mass index is 36.97 kg/m.   ECOG FS:2 - Symptomatic, <50% confined to bed  Sclerae unicteric, EOMs intact Wearing a mask No cervical or supraclavicular adenopathy Lungs no rales or rhonchi Heart regular rate and rhythm Abd soft, nontender, positive bowel sounds MSK no focal spinal tenderness, no upper extremity lymphedema Neuro: nonfocal, well oriented, appropriate affect Breasts: Deferred   12/01/2020 right axilla    LAB RESULTS:  CMP   No results found for: TOTALPROTELP, ALBUMINELP, A1GS, A2GS, BETS, BETA2SER, GAMS, MSPIKE, SPEI  No results found for: KPAFRELGTCHN, LAMBDASER, KAPLAMBRATIO  Lab Results  Component Value Date   WBC 8.4 12/22/2020   NEUTROABS 5.3 12/22/2020   HGB 8.5 (L) 12/22/2020   HCT 26.5 (L) 12/22/2020   MCV 99.3 12/22/2020   PLT 763 (H) 12/22/2020    No results found  for: LABCA2  No components found for: MGQQPY195  No results for input(s): INR in the last 168 hours.  No results found for: LABCA2  No results found for: KDT267  No results found for: CAN125  No results found for: TIW580  Lab Results  Component Value Date   CA2729 239.4 (H) 12/01/2020  No components found for: HGQUANT  No results found for: CEA1 / No results found for: CEA1   No results found for: AFPTUMOR  No results found for: CHROMOGRNA  No results found for: HGBA, HGBA2QUANT, HGBFQUANT, HGBSQUAN (Hemoglobinopathy evaluation)   No results found for: LDH  No results found for: IRON, TIBC, IRONPCTSAT (Iron and TIBC)  No results found for: FERRITIN  Urinalysis No results found for: COLORURINE, APPEARANCEUR, LABSPEC, PHURINE, GLUCOSEU, HGBUR, BILIRUBINUR, KETONESUR, PROTEINUR, UROBILINOGEN, NITRITE, LEUKOCYTESUR   STUDIES: Refuses mammography at this point (12/23/2019)   ELIGIBLE FOR AVAILABLE RESEARCH PROTOCOL: no   ASSESSMENT: 65 y.o. [place], [place] woman with stage IV breast cancer, as follows:  (1) status post right mastectomy in 2000, for a pT2 pN2, stage IIIA invasive ductal carcinoma, estrogen receptor positive, progesterone receptor not tested, HER-2 negative (0) by immunohistochemistry  (a) adjuvant chemotherapy with doxorubicin and cyclophosphamide in dose dense fashion x4 followed by paclitaxel in dose dense fashion x4  (b) adjuvant radiation: 30 doses  (c) antiestrogens: Tamoxifen for 5 years, completed 2005  (2) METASTASTIC DISEASE: 2008, involving bones, lungs, and lymph nodes  (a) CA 27-29 is informative  (b) CT of the chest abdomen and pelvis in [facility] finds stable sclerotic metastases (T8, T10, T11, sternum, L4, L5); 0.6 cm left upper lobe lung nodule stable  (3)  prior anti-estrogen treatments:  (a) fulvestrant--progression  (b) exemestane/everolimus--hyperlipidemia, hyperglycemia  (4) prior chemotherapy  treatments:  (a) capecitabine: progression  (b) Abraxane, August 2014 through 11/18/2015: good response but stopped due to neuropathy  (c) gemcitabine 01/20/2016--?; multiple interruptions secondary to infections  (5) radiation therapy:  (a) T spine and Right femur, completed 01/10/2016  (6) letrozole/ palbociclib started 07/13/2016, discontinued 10/26/2020 with evidence of progression  (7) bone treatment:  (a) denosumab/Xgeva--discontinued January 2016 due to osteonecrosis of the jaw  (8) cancer associated pain: Updated 12/01/2020 (a) OxyIR 5 mg QID PRN  (b) PMP Aware reviewed 12/01/2020  (c) OxyContin 20 mg p.o. 3 times daily  (d) bowel prophylaxis: MiraLAX as needed  (9) restaging studies:  (a) mostly stable bone lesions with evidence of progression at L3-L5; no epidural tumor by total spinal MRI in November 2019  (b) no evidence of progressive lung, lymph node or liver lesions by CT scans of the chest abdomen and pelvis and bone scan December 2019  (c) CT scans abd/pelvis 03/31/2019 shows stable bone metastases, no extraosseous disease  (d) MRI pelvis 04/09/2019 shows bilateral ilium, left acetabulum and lumbar mets, stable  (e) CT abd/pelvis 09/01/2019 shows stable bone mets, slight increase left effusion  (f) CT of the chest 05/05/2020 again shows minimal increase in the left pleural effusion, possible resorption of bone around the left glenoid lesion, otherwise stable  (g) CT of the chest abdomen and pelvis 10/18/2020 again shows no evidence of visceral disease, but there is apparent progression in the bone disease, and some enlargement of the left pleural effusion.  The CA 27-29 has also been rising.  Accordingly her letrozole and palbociclib are being discontinued  (10) palliative radiation 11/18/2018 through 12/02/2018 Site/dose:   1. Lumbar spine; 35 Gy in 14 fractions of 2.5 Gy                      2. Pelvis; 35 Gy in 14 fractions of 2.5 Gy  (11) started cyclophosphamide,  methotrexate and fluorouracil [CMF] 11/10/2020  (a) methotrexate held first dose, during palliative radiation  (12) status post left thoracentesis 11/21/2020, with positive cytology  (  a) tumor cells are strongly estrogen and progesterone receptor positive, HER-2 negative  (b) foundation 1 requested on the thoracentesis sample shows a PI K3 CA mutation.  Microsatellite status is stable and the tumor mutation burden is low.  There are mutations in ESR 1, GAT A3, and [place] 6  PLAN: Mia [name] continues to have significant pain.  We discussed the fact that the goal of pain treatment is to make her more functional and if she is not functional on the current doses of what she is getting to then she may need more.  Currently the place where the pain is least control is the right shoulder.  We are going to obtain an MRI of that area just to see if we are missing something that could be fixed.  She does have also significant lymphedema there.  That seems to be worsening.  She might benefit from a compression machine and I will refer her to physical therapy with all those considerations in mind.  Since we have a PI 3K mutation in her tumor she would be a good candidate for alpelisib.  Very likely after 4 cycles of CMF unless we have a significant response we will go to fulvestrant and alpelisib.  I have set her up for a CT of the chest to be done after her fourth CMF dose.    She knows to call for any other issue that may develop before the next visit.  Total encounter time 40 minutes.Sarajane Jews C. Taelor Waymire, MD 12/22/20 2:59 PM Medical Oncology and Hematology [facility] [place], [place] [address] Tel. [phone]    Fax. [phone]   I, Wilburn Mylar, am acting as scribe for Dr. Virgie Dad. Vergia Chea.  I, Lurline Del MD, have reviewed the above documentation for accuracy and completeness, and I agree with the above.   *Total Encounter Time as defined by the  Centers for Medicare and Medicaid Services includes, in addition to the face-to-face time of a patient visit (documented in the note above) non-face-to-face time: obtaining and reviewing outside history, ordering and reviewing medications, tests or procedures, care coordination (communications with other health care professionals or caregivers) and documentation in the medical record.

## 2020-12-21 NOTE — Telephone Encounter (Signed)
Refill sent in case it is needed, but I have not seen anything else about pts losartan Rx

## 2020-12-22 ENCOUNTER — Inpatient Hospital Stay: Payer: MEDICARE

## 2020-12-22 ENCOUNTER — Inpatient Hospital Stay (HOSPITAL_BASED_OUTPATIENT_CLINIC_OR_DEPARTMENT_OTHER): Payer: MEDICARE | Admitting: Oncology

## 2020-12-22 ENCOUNTER — Other Ambulatory Visit: Payer: Self-pay

## 2020-12-22 VITALS — BP 102/48 | HR 95 | Temp 97.2°F | Resp 18 | Ht 64.0 in

## 2020-12-22 DIAGNOSIS — C7951 Secondary malignant neoplasm of bone: Secondary | ICD-10-CM | POA: Diagnosis not present

## 2020-12-22 DIAGNOSIS — C773 Secondary and unspecified malignant neoplasm of axilla and upper limb lymph nodes: Secondary | ICD-10-CM | POA: Diagnosis not present

## 2020-12-22 DIAGNOSIS — C50919 Malignant neoplasm of unspecified site of unspecified female breast: Secondary | ICD-10-CM

## 2020-12-22 DIAGNOSIS — Z170 Estrogen receptor positive status [ER+]: Secondary | ICD-10-CM | POA: Diagnosis not present

## 2020-12-22 DIAGNOSIS — M879 Osteonecrosis, unspecified: Secondary | ICD-10-CM

## 2020-12-22 DIAGNOSIS — C7800 Secondary malignant neoplasm of unspecified lung: Secondary | ICD-10-CM

## 2020-12-22 DIAGNOSIS — Z5111 Encounter for antineoplastic chemotherapy: Secondary | ICD-10-CM | POA: Diagnosis not present

## 2020-12-22 DIAGNOSIS — C50911 Malignant neoplasm of unspecified site of right female breast: Secondary | ICD-10-CM | POA: Diagnosis not present

## 2020-12-22 LAB — CBC WITH DIFFERENTIAL/PLATELET
Abs Immature Granulocytes: 1.51 10*3/uL — ABNORMAL HIGH (ref 0.00–0.07)
Basophils Absolute: 0 10*3/uL (ref 0.0–0.1)
Basophils Relative: 1 %
Eosinophils Absolute: 0.1 10*3/uL (ref 0.0–0.5)
Eosinophils Relative: 1 %
HCT: 26.5 % — ABNORMAL LOW (ref 36.0–46.0)
Hemoglobin: 8.5 g/dL — ABNORMAL LOW (ref 12.0–15.0)
Immature Granulocytes: 18 %
Lymphocytes Relative: 2 %
Lymphs Abs: 0.2 10*3/uL — ABNORMAL LOW (ref 0.7–4.0)
MCH: 31.8 pg (ref 26.0–34.0)
MCHC: 32.1 g/dL (ref 30.0–36.0)
MCV: 99.3 fL (ref 80.0–100.0)
Monocytes Absolute: 1.3 10*3/uL — ABNORMAL HIGH (ref 0.1–1.0)
Monocytes Relative: 16 %
Neutro Abs: 5.3 10*3/uL (ref 1.7–7.7)
Neutrophils Relative %: 62 %
Platelets: 763 10*3/uL — ABNORMAL HIGH (ref 150–400)
RBC: 2.67 MIL/uL — ABNORMAL LOW (ref 3.87–5.11)
RDW: 17.4 % — ABNORMAL HIGH (ref 11.5–15.5)
WBC: 8.4 10*3/uL (ref 4.0–10.5)
nRBC: 0.5 % — ABNORMAL HIGH (ref 0.0–0.2)

## 2020-12-22 LAB — COMPREHENSIVE METABOLIC PANEL
ALT: 8 U/L (ref 0–44)
AST: 19 U/L (ref 15–41)
Albumin: 2.7 g/dL — ABNORMAL LOW (ref 3.5–5.0)
Alkaline Phosphatase: 109 U/L (ref 38–126)
Anion gap: 11 (ref 5–15)
BUN: 17 mg/dL (ref 8–23)
CO2: 27 mmol/L (ref 22–32)
Calcium: 9.3 mg/dL (ref 8.9–10.3)
Chloride: 98 mmol/L (ref 98–111)
Creatinine, Ser: 1.24 mg/dL — ABNORMAL HIGH (ref 0.44–1.00)
GFR, Estimated: 49 mL/min — ABNORMAL LOW (ref >60)
Glucose, Bld: 93 mg/dL (ref 70–99)
Potassium: 4.6 mmol/L (ref 3.5–5.1)
Sodium: 136 mmol/L (ref 135–145)
Total Bilirubin: 0.3 mg/dL (ref 0.3–1.2)
Total Protein: 6.4 g/dL — ABNORMAL LOW (ref 6.5–8.1)

## 2020-12-22 MED ORDER — PALONOSETRON HCL INJECTION 0.25 MG/5ML
INTRAVENOUS | Status: AC
  Filled 2020-12-22: qty 5

## 2020-12-22 MED ORDER — PALONOSETRON HCL INJECTION 0.25 MG/5ML
0.2500 mg | Freq: Once | INTRAVENOUS | Status: AC
  Administered 2020-12-22: 0.25 mg via INTRAVENOUS

## 2020-12-22 MED ORDER — METHOTREXATE SODIUM (PF) CHEMO INJECTION 250 MG/10ML
40.0000 mg/m2 | Freq: Once | INTRAMUSCULAR | Status: AC
  Administered 2020-12-22: 84 mg via INTRAVENOUS
  Filled 2020-12-22: qty 3.36

## 2020-12-22 MED ORDER — SODIUM CHLORIDE 0.9 % IV SOLN
Freq: Once | INTRAVENOUS | Status: AC
  Filled 2020-12-22: qty 250

## 2020-12-22 MED ORDER — SODIUM CHLORIDE 0.9 % IV SOLN
10.0000 mg | Freq: Once | INTRAVENOUS | Status: AC
  Administered 2020-12-22: 10 mg via INTRAVENOUS
  Filled 2020-12-22: qty 10

## 2020-12-22 MED ORDER — FLUOROURACIL CHEMO INJECTION 2.5 GM/50ML
600.0000 mg/m2 | Freq: Once | INTRAVENOUS | Status: AC
  Administered 2020-12-22: 1250 mg via INTRAVENOUS
  Filled 2020-12-22: qty 25

## 2020-12-22 MED ORDER — SODIUM CHLORIDE 0.9 % IV SOLN
600.0000 mg/m2 | Freq: Once | INTRAVENOUS | Status: AC
  Administered 2020-12-22: 1260 mg via INTRAVENOUS
  Filled 2020-12-22: qty 63

## 2020-12-22 NOTE — Patient Instructions (Signed)
[facility] Discharge Instructions for Patients Receiving Chemotherapy  Today you received the following chemotherapy agents Cyclophosfamide, Methotrexate, Florouricil  To help prevent nausea and vomiting after your treatment, we encourage you to take your nausea medication as directed.   If you develop nausea and vomiting that is not controlled by your nausea medication, call the clinic.   BELOW ARE SYMPTOMS THAT SHOULD BE REPORTED IMMEDIATELY:  *FEVER GREATER THAN 100.5 F  *CHILLS WITH OR WITHOUT FEVER  NAUSEA AND VOMITING THAT IS NOT CONTROLLED WITH YOUR NAUSEA MEDICATION  *UNUSUAL SHORTNESS OF BREATH  *UNUSUAL BRUISING OR BLEEDING  TENDERNESS IN MOUTH AND THROAT WITH OR WITHOUT PRESENCE OF ULCERS  *URINARY PROBLEMS  *BOWEL PROBLEMS  UNUSUAL RASH Items with * indicate a potential emergency and should be followed up as soon as possible.  Feel free to call the clinic should you have any questions or concerns. The clinic phone number is [phone].  Please show the [facility] at check-in to the Emergency Department and triage nurse.

## 2020-12-23 ENCOUNTER — Telehealth: Payer: Self-pay | Admitting: Oncology

## 2020-12-23 LAB — CANCER ANTIGEN 27.29: CA 27.29: 193.1 U/mL — ABNORMAL HIGH (ref 0.0–38.6)

## 2020-12-23 NOTE — Telephone Encounter (Signed)
Scheduled appt per 3/24 los. Pt aware.

## 2020-12-28 ENCOUNTER — Other Ambulatory Visit: Payer: Self-pay

## 2020-12-28 ENCOUNTER — Encounter: Payer: Self-pay | Admitting: Physical Therapy

## 2020-12-28 ENCOUNTER — Encounter: Payer: Self-pay | Admitting: Oncology

## 2020-12-28 ENCOUNTER — Ambulatory Visit: Payer: MEDICARE | Attending: Oncology | Admitting: Physical Therapy

## 2020-12-28 DIAGNOSIS — C50911 Malignant neoplasm of unspecified site of right female breast: Secondary | ICD-10-CM | POA: Diagnosis not present

## 2020-12-28 DIAGNOSIS — M25611 Stiffness of right shoulder, not elsewhere classified: Secondary | ICD-10-CM | POA: Diagnosis not present

## 2020-12-28 DIAGNOSIS — Z9011 Acquired absence of right breast and nipple: Secondary | ICD-10-CM | POA: Diagnosis not present

## 2020-12-28 DIAGNOSIS — I972 Postmastectomy lymphedema syndrome: Secondary | ICD-10-CM | POA: Diagnosis not present

## 2020-12-28 DIAGNOSIS — M25511 Pain in right shoulder: Secondary | ICD-10-CM | POA: Diagnosis not present

## 2020-12-28 DIAGNOSIS — G8929 Other chronic pain: Secondary | ICD-10-CM

## 2020-12-28 DIAGNOSIS — C50919 Malignant neoplasm of unspecified site of unspecified female breast: Secondary | ICD-10-CM

## 2020-12-28 MED ORDER — GABAPENTIN 300 MG PO CAPS: 300.0000 mg | ORAL_CAPSULE | Freq: Four times a day (QID) | ORAL | 0 refills | Status: DC

## 2020-12-28 NOTE — Therapy (Signed)
[place], [place], [address] Phone: [phone]   Fax:  [phone]  Physical Therapy Evaluation  Patient Details  Name: Mia [name] MRN: [id number] Date of Birth: October 27, 1955 Referring Provider (PT): Dr. Gunnar Bulla Magrinat   Encounter Date: 12/28/2020   PT End of Session - 12/28/20 1338    Visit Number 1    Number of Visits 5    Date for PT Re-Evaluation 01/25/21    PT Start Time 8756    PT Stop Time 1338    PT Time Calculation (min) 35 min    Activity Tolerance Patient tolerated treatment well    Behavior During Therapy [facility] for tasks assessed/performed           Past Medical History:  Diagnosis Date  . Breast cancer metastasized to bone ([name])   . Diabetes mellitus without complication ([place])   . History of radiation therapy 11/18/2018-12/02/2018   lumbar spine and pelvis   Dr [name]  . History of radiation therapy 11/08/2020-11/18/2020   right arm   Dr [name]  . Hyperlipidemia   . Hypertension   . Lymphedema of right arm   . Neuropathy associated with cancer ([place])   . Obesity (BMI 35.0-39.9 without comorbidity)     Past Surgical History:  Procedure Laterality Date  . CATARACT EXTRACTION Left   . HYSTERECTOMY ABDOMINAL WITH SALPINGO-OOPHORECTOMY    . MODIFIED RADICAL MASTECTOMY Right     There were no vitals filed for this visit.    Subjective Assessment - 12/28/20 1308    Subjective I am here because I want a compression pump.    Pertinent History Right breast cancer diagnosed 2000 with mastectomy with 16 lymph nodes removed (4 positive), chemo and radiation. 2008 had metastases in bones and lungs, and has been on treatment since. Just moved down here three weeks ago and is seeing Dr. [name] now. Diabetes, high blood pressure, high cholesterol.    Patient Stated Goals to get a compression pump    Currently in Pain? No/denies              [facility] PT Assessment -  12/28/20 0001      Assessment   Medical Diagnosis stage IV right breast cancer    Referring Provider (PT) Dr. Gunnar Bulla Magrinat    Onset Date/Surgical Date 12/31/98   R mastectomy   Hand Dominance Right    Prior Therapy 2019 for lymphedema      Precautions   Precautions Other (comment)    Precaution Comments metastatic breast cancer including bones      Restrictions   Weight Bearing Restrictions No      Balance Screen   Has the patient fallen in the past 6 months No    Has the patient had a decrease in activity level because of a fear of falling?  No    Is the patient reluctant to leave their home because of a fear of falling?  No      Home Environment   Living Environment Private residence    Living Arrangements Spouse/significant other    Available Help at Discharge Family    Type of [place]   town home     Prior Function   Level of Independence Needs assistance with ADLs;Needs assistance with homemaking;Needs assistance with transfers;Independent with household mobility with device    Vocation On disability    Leisure unable to exercise      Cognition   Overall Cognitive  Status Within Functional Limits for tasks assessed      Observation/Other Assessments   Observations RUE very swollen      AROM   Overall AROM  Deficits   not assessed as it is too painful for pt to move her arm            LYMPHEDEMA/ONCOLOGY QUESTIONNAIRE - 12/28/20 0001      Type   Cancer Type right breast, stage IV      Surgeries   Mastectomy Date 12/31/98    Axillary Lymph Node Dissection Date 12/31/98    Number Lymph Nodes Removed 16   4 were positive     Date Lymphedema/Swelling Started   Date 03/01/00      Treatment   Active Chemotherapy Treatment Yes    Past Chemotherapy Treatment Yes    Active Radiation Treatment No    Past Radiation Treatment Yes    Past Hormone Therapy Yes    Drug Name Tamoxifen      What other symptoms do you have   Are you Having Heaviness or Tightness  Yes    Are you having Pain Yes    Are you having pitting edema Yes    Body Site throughout R UE    Is it Hard or Difficult finding clothes that fit Yes    Do you have infections No      Right Upper Extremity Lymphedema   15 cm Proximal to Olecranon Process 47 cm    10 cm Proximal to Olecranon Process 44 cm    Olecranon Process 33.9 cm    15 cm Proximal to Ulnar Styloid Process 37 cm    10 cm Proximal to Ulnar Styloid Process 34.5 cm    Just Proximal to Ulnar Styloid Process 24.5 cm    Across Hand at [employer] 21.5 cm    At [place] of 2nd Digit 6.9 cm      Left Upper Extremity Lymphedema   15 cm Proximal to Olecranon Process 41.5 cm    10 cm Proximal to Olecranon Process 41.5 cm    Olecranon Process 28.5 cm    15 cm Proximal to Ulnar Styloid Process 27.5 cm    10 cm Proximal to Ulnar Styloid Process 25.3 cm    Just Proximal to Ulnar Styloid Process 17.2 cm    Across Hand at [employer] 18.4 cm    At [place] of 2nd Digit 6.3 cm                 Flowsheet Row Outpatient Rehab from [facility]17/[facility]  Lymphedema Life Impact Scale Total Score 19.12 %      Objective measurements completed on examination: See above findings.                 PT Short Term Goals - 03/17/18 1723      PT SHORT TERM GOAL #1   Title Pt. will be independent in correctly performing self-manual lymph drainage.    Time 4    Period Weeks    Status New      PT SHORT TERM GOAL #2   Title Right arm circumference at 10 cm. proximal to ulnar styloid will stay below 31.3 cm. even as we head into the heat of summer    Time 4    Period Weeks    Status New             PT Long Term Goals - 12/28/20  [place] #1   Title Pt will receive trial of [employer] compression pump to decrease risk of cellulitis    Time 4    Period Weeks    Status New    Target Date 01/25/21                  Plan - 12/28/20 1339     Clinical Impression Statement Pt presents to PT with exacerbation of RUE lymphedema. She has stage IV breast cancer and underwent a R mastectomy in 2000, she has metastasis in 2007 to bone, lung and lymph nodes. Pt was seen in this clinic in 2019 for lymphedema treatment. In Jan 2022 pt became unable to move the R shoulder. She has severe pain in R shoulder and is unable to move it without pain. Unable to assess shoulder ROM due to this today. Pt is scheduled for an MRI in a few weeks. Pt reports she is unable to do self MLD at this point because she gets short of breath with any exertion and the pain in her R shoulder is so great. She is unable to try elevation due to lack of mobility in R shoulder. She has a wound in her R axilla that is open. She would benefit from a compression pump as she is unable to do self MLD and unable to elevate or exercise her R UE. She has a R axillary wound that places her at greater risk for developing cellulitis. Pending her shoulder MRI pt may benefit from skilled PT services for MLD and ROM. Pt will be placed on hold until the results of her MRI are in.    Personal Factors and Comorbidities Comorbidity 3+    Comorbidities metastatic breat cancer, open wound in R axilla, severely limited shoulder ROM    Examination-Activity Limitations Bathing;Hygiene/Grooming;Lift;Stairs;Locomotion Level;Stand;Reach Overhead;Carry;Transfers;Dressing    Examination-Participation Restrictions Laundry;Cleaning;Shop;Community Activity;Meal Prep;Driving;Yard Work    [employer] Evolving/Moderate complexity    Clinical Decision Making Moderate    Rehab Potential Good    PT Frequency 1x / week   on hold until after MRI   PT Duration 4 weeks    PT Treatment/Interventions ADLs/Self Care Home Management;Patient/family education;Manual lymph drainage;Manual techniques;Taping;Therapeutic exercise;Therapeutic activities;Passive range of motion;Vasopneumatic Device    PT Next  Visit Plan see results of MRI and possibly PROM to R shoulder - need to add appropriate goals, MLD to R shoulder, see if pt was approved for compression pump    Recommended Other Services sent demographics to [employer] at pt request    Consulted and Agree with Plan of Care Patient;Family member/caregiver    Family Member Consulted husband           Patient will benefit from skilled therapeutic intervention in order to improve the following deficits and impairments:  Pain,Postural dysfunction,Impaired UE functional use,Increased fascial restricitons,Decreased strength,Decreased activity tolerance,Decreased range of motion,Increased edema  Visit Diagnosis: Postmastectomy lymphedema  Chronic right shoulder pain  Stiffness of right shoulder, not elsewhere classified  Postmastectomy lymphangiosarcoma of right breast [facility])     Problem List Patient Active Problem List   Diagnosis Date Noted  . Goals of care, counseling/discussion 09/29/2019  . Paronychia of thumb, left 02/03/2019  . Drug-induced neutropenia ([place]) 12/04/2018  . HTN (hypertension) 06/03/2018  . HLD (hyperlipidemia) 06/03/2018  . Metastatic breast cancer ([place]) 03/12/2018  . Bone metastases ([place]) 03/12/2018  . Lung metastases ([place]) 03/12/2018  . Osteonecrosis ([place]) 03/12/2018  . Peripheral neuropathy due  to chemotherapy ([place]) 03/12/2018  . Diabetes mellitus type 2 in obese ([place]) 03/12/2018  . Hepatic steatosis 03/12/2018  . Aortic atherosclerosis ([place]) 03/12/2018    Allyson Sabal [facility] 12/28/2020, 1:48 PM  [place], [place], [address] Phone: [phone]   Fax:  [phone]  Name: Seline Enzor MRN: [id number] Date of Birth: 06-24-1956  Manus Gunning, PT 12/28/20 1:48 PM

## 2020-12-30 DIAGNOSIS — I2699 Other pulmonary embolism without acute cor pulmonale: Secondary | ICD-10-CM

## 2020-12-30 HISTORY — DX: Other pulmonary embolism without acute cor pulmonale: I26.99

## 2021-01-02 ENCOUNTER — Encounter: Payer: Self-pay | Admitting: Oncology

## 2021-01-02 ENCOUNTER — Other Ambulatory Visit: Payer: Self-pay | Admitting: Family Medicine

## 2021-01-03 ENCOUNTER — Other Ambulatory Visit: Payer: Self-pay

## 2021-01-03 ENCOUNTER — Other Ambulatory Visit (HOSPITAL_COMMUNITY)
Admission: RE | Admit: 2021-01-03 | Discharge: 2021-01-03 | Disposition: A | Discharge status: Home / Self Care | Payer: MEDICARE | Source: Ambulatory Visit | Attending: Oncology | Admitting: Oncology

## 2021-01-03 ENCOUNTER — Other Ambulatory Visit: Payer: Self-pay | Admitting: Oncology

## 2021-01-03 ENCOUNTER — Telehealth: Payer: Self-pay | Admitting: *Deleted

## 2021-01-03 DIAGNOSIS — Z20822 Contact with and (suspected) exposure to covid-19: Secondary | ICD-10-CM | POA: Insufficient documentation

## 2021-01-03 DIAGNOSIS — C50919 Malignant neoplasm of unspecified site of unspecified female breast: Secondary | ICD-10-CM

## 2021-01-03 DIAGNOSIS — Z01812 Encounter for preprocedural laboratory examination: Secondary | ICD-10-CM | POA: Insufficient documentation

## 2021-01-03 DIAGNOSIS — C7802 Secondary malignant neoplasm of left lung: Secondary | ICD-10-CM

## 2021-01-03 LAB — SARS CORONAVIRUS 2 (TAT 6-24 HRS): SARS Coronavirus 2: NEGATIVE

## 2021-01-03 NOTE — Telephone Encounter (Signed)
Connected with [facility] for Prescription Benefit Information for Oxycodone ER 20 mg tablet.  Fax received for alternative; not covered.  Ulice Dash reports patient picked up order earlier today for $40.00 co-pay. No prior authorization activity per this nurse.

## 2021-01-03 NOTE — Progress Notes (Signed)
Pt contacted to give her information regarding an IR thoracentesis scheduled for Friday 01/06/21 at 10:00 am at [facility]. Pt to have a COVID test done today. Pt acknowledged information and repeated back appointment dates and times.

## 2021-01-05 ENCOUNTER — Telehealth: Payer: Self-pay

## 2021-01-05 ENCOUNTER — Other Ambulatory Visit: Payer: Self-pay

## 2021-01-05 ENCOUNTER — Ambulatory Visit (HOSPITAL_COMMUNITY): Payer: MEDICARE

## 2021-01-05 ENCOUNTER — Encounter (HOSPITAL_COMMUNITY): Payer: Self-pay

## 2021-01-05 ENCOUNTER — Inpatient Hospital Stay: Payer: MEDICARE | Attending: Oncology | Admitting: Adult Health

## 2021-01-05 ENCOUNTER — Encounter: Payer: Self-pay | Admitting: Adult Health

## 2021-01-05 ENCOUNTER — Inpatient Hospital Stay (HOSPITAL_COMMUNITY)
Admission: EM | Admit: 2021-01-05 | Discharge: 2021-01-14 | DRG: 981 | Disposition: A | Discharge status: Home Health | Payer: MEDICARE | Attending: Internal Medicine | Admitting: Internal Medicine

## 2021-01-05 ENCOUNTER — Emergency Department (HOSPITAL_COMMUNITY): Payer: MEDICARE

## 2021-01-05 VITALS — BP 117/87 | HR 89 | Temp 97.5°F | Resp 18 | Ht 64.0 in | Wt 219.0 lb

## 2021-01-05 DIAGNOSIS — R0602 Shortness of breath: Secondary | ICD-10-CM | POA: Insufficient documentation

## 2021-01-05 DIAGNOSIS — E669 Obesity, unspecified: Secondary | ICD-10-CM | POA: Diagnosis not present

## 2021-01-05 DIAGNOSIS — E871 Hypo-osmolality and hyponatremia: Secondary | ICD-10-CM | POA: Diagnosis not present

## 2021-01-05 DIAGNOSIS — E1169 Type 2 diabetes mellitus with other specified complication: Secondary | ICD-10-CM | POA: Diagnosis not present

## 2021-01-05 DIAGNOSIS — J90 Pleural effusion, not elsewhere classified: Secondary | ICD-10-CM

## 2021-01-05 DIAGNOSIS — E875 Hyperkalemia: Secondary | ICD-10-CM | POA: Diagnosis not present

## 2021-01-05 DIAGNOSIS — M84421A Pathological fracture, right humerus, initial encounter for fracture: Secondary | ICD-10-CM | POA: Diagnosis not present

## 2021-01-05 DIAGNOSIS — I700 Atherosclerosis of aorta: Secondary | ICD-10-CM | POA: Diagnosis present

## 2021-01-05 DIAGNOSIS — Z170 Estrogen receptor positive status [ER+]: Secondary | ICD-10-CM | POA: Insufficient documentation

## 2021-01-05 DIAGNOSIS — Z6837 Body mass index (BMI) 37.0-37.9, adult: Secondary | ICD-10-CM | POA: Diagnosis not present

## 2021-01-05 DIAGNOSIS — N179 Acute kidney failure, unspecified: Secondary | ICD-10-CM | POA: Diagnosis present

## 2021-01-05 DIAGNOSIS — Z803 Family history of malignant neoplasm of breast: Secondary | ICD-10-CM

## 2021-01-05 DIAGNOSIS — E1165 Type 2 diabetes mellitus with hyperglycemia: Secondary | ICD-10-CM | POA: Diagnosis present

## 2021-01-05 DIAGNOSIS — E1140 Type 2 diabetes mellitus with diabetic neuropathy, unspecified: Secondary | ICD-10-CM | POA: Diagnosis not present

## 2021-01-05 DIAGNOSIS — E785 Hyperlipidemia, unspecified: Secondary | ICD-10-CM | POA: Diagnosis present

## 2021-01-05 DIAGNOSIS — Z419 Encounter for procedure for purposes other than remedying health state, unspecified: Secondary | ICD-10-CM

## 2021-01-05 DIAGNOSIS — I10 Essential (primary) hypertension: Secondary | ICD-10-CM | POA: Diagnosis not present

## 2021-01-05 DIAGNOSIS — Z800 Family history of malignant neoplasm of digestive organs: Secondary | ICD-10-CM

## 2021-01-05 DIAGNOSIS — Z7982 Long term (current) use of aspirin: Secondary | ICD-10-CM | POA: Diagnosis not present

## 2021-01-05 DIAGNOSIS — C7800 Secondary malignant neoplasm of unspecified lung: Secondary | ICD-10-CM | POA: Insufficient documentation

## 2021-01-05 DIAGNOSIS — Z7984 Long term (current) use of oral hypoglycemic drugs: Secondary | ICD-10-CM | POA: Diagnosis not present

## 2021-01-05 DIAGNOSIS — R251 Tremor, unspecified: Secondary | ICD-10-CM | POA: Diagnosis present

## 2021-01-05 DIAGNOSIS — Z20822 Contact with and (suspected) exposure to covid-19: Secondary | ICD-10-CM | POA: Diagnosis present

## 2021-01-05 DIAGNOSIS — R059 Cough, unspecified: Secondary | ICD-10-CM | POA: Diagnosis not present

## 2021-01-05 DIAGNOSIS — T451X5A Adverse effect of antineoplastic and immunosuppressive drugs, initial encounter: Secondary | ICD-10-CM

## 2021-01-05 DIAGNOSIS — I959 Hypotension, unspecified: Secondary | ICD-10-CM | POA: Diagnosis not present

## 2021-01-05 DIAGNOSIS — Z9221 Personal history of antineoplastic chemotherapy: Secondary | ICD-10-CM

## 2021-01-05 DIAGNOSIS — Z79899 Other long term (current) drug therapy: Secondary | ICD-10-CM

## 2021-01-05 DIAGNOSIS — G8918 Other acute postprocedural pain: Secondary | ICD-10-CM | POA: Diagnosis not present

## 2021-01-05 DIAGNOSIS — C50911 Malignant neoplasm of unspecified site of right female breast: Secondary | ICD-10-CM | POA: Diagnosis not present

## 2021-01-05 DIAGNOSIS — Z923 Personal history of irradiation: Secondary | ICD-10-CM | POA: Insufficient documentation

## 2021-01-05 DIAGNOSIS — C779 Secondary and unspecified malignant neoplasm of lymph node, unspecified: Secondary | ICD-10-CM | POA: Insufficient documentation

## 2021-01-05 DIAGNOSIS — Z794 Long term (current) use of insulin: Secondary | ICD-10-CM

## 2021-01-05 DIAGNOSIS — J9601 Acute respiratory failure with hypoxia: Secondary | ICD-10-CM | POA: Diagnosis not present

## 2021-01-05 DIAGNOSIS — M25511 Pain in right shoulder: Secondary | ICD-10-CM | POA: Diagnosis not present

## 2021-01-05 DIAGNOSIS — C50919 Malignant neoplasm of unspecified site of unspecified female breast: Secondary | ICD-10-CM | POA: Diagnosis present

## 2021-01-05 DIAGNOSIS — E860 Dehydration: Secondary | ICD-10-CM | POA: Diagnosis present

## 2021-01-05 DIAGNOSIS — Z01812 Encounter for preprocedural laboratory examination: Secondary | ICD-10-CM | POA: Insufficient documentation

## 2021-01-05 DIAGNOSIS — X58XXXA Exposure to other specified factors, initial encounter: Secondary | ICD-10-CM | POA: Diagnosis present

## 2021-01-05 DIAGNOSIS — D709 Neutropenia, unspecified: Secondary | ICD-10-CM | POA: Diagnosis present

## 2021-01-05 DIAGNOSIS — J984 Other disorders of lung: Secondary | ICD-10-CM | POA: Diagnosis not present

## 2021-01-05 DIAGNOSIS — E869 Volume depletion, unspecified: Secondary | ICD-10-CM | POA: Diagnosis present

## 2021-01-05 DIAGNOSIS — I890 Lymphedema, not elsewhere classified: Secondary | ICD-10-CM | POA: Diagnosis not present

## 2021-01-05 DIAGNOSIS — C7951 Secondary malignant neoplasm of bone: Secondary | ICD-10-CM | POA: Insufficient documentation

## 2021-01-05 DIAGNOSIS — Z09 Encounter for follow-up examination after completed treatment for conditions other than malignant neoplasm: Secondary | ICD-10-CM

## 2021-01-05 DIAGNOSIS — M549 Dorsalgia, unspecified: Secondary | ICD-10-CM | POA: Diagnosis present

## 2021-01-05 DIAGNOSIS — R918 Other nonspecific abnormal finding of lung field: Secondary | ICD-10-CM | POA: Diagnosis not present

## 2021-01-05 DIAGNOSIS — S42294K Other nondisplaced fracture of upper end of right humerus, subsequent encounter for fracture with nonunion: Secondary | ICD-10-CM | POA: Diagnosis not present

## 2021-01-05 DIAGNOSIS — D630 Anemia in neoplastic disease: Secondary | ICD-10-CM | POA: Diagnosis present

## 2021-01-05 DIAGNOSIS — R5381 Other malaise: Secondary | ICD-10-CM | POA: Diagnosis present

## 2021-01-05 DIAGNOSIS — S42201K Unspecified fracture of upper end of right humerus, subsequent encounter for fracture with nonunion: Secondary | ICD-10-CM | POA: Diagnosis not present

## 2021-01-05 DIAGNOSIS — Z9011 Acquired absence of right breast and nipple: Secondary | ICD-10-CM | POA: Diagnosis not present

## 2021-01-05 DIAGNOSIS — J910 Malignant pleural effusion: Secondary | ICD-10-CM | POA: Diagnosis not present

## 2021-01-05 DIAGNOSIS — R339 Retention of urine, unspecified: Secondary | ICD-10-CM | POA: Insufficient documentation

## 2021-01-05 DIAGNOSIS — G893 Neoplasm related pain (acute) (chronic): Secondary | ICD-10-CM | POA: Diagnosis not present

## 2021-01-05 DIAGNOSIS — C3490 Malignant neoplasm of unspecified part of unspecified bronchus or lung: Secondary | ICD-10-CM | POA: Diagnosis not present

## 2021-01-05 DIAGNOSIS — S42201A Unspecified fracture of upper end of right humerus, initial encounter for closed fracture: Secondary | ICD-10-CM | POA: Diagnosis not present

## 2021-01-05 DIAGNOSIS — Z853 Personal history of malignant neoplasm of breast: Secondary | ICD-10-CM | POA: Diagnosis not present

## 2021-01-05 DIAGNOSIS — K760 Fatty (change of) liver, not elsewhere classified: Secondary | ICD-10-CM | POA: Diagnosis not present

## 2021-01-05 DIAGNOSIS — Z9889 Other specified postprocedural states: Secondary | ICD-10-CM

## 2021-01-05 DIAGNOSIS — G8929 Other chronic pain: Secondary | ICD-10-CM | POA: Diagnosis not present

## 2021-01-05 DIAGNOSIS — C7802 Secondary malignant neoplasm of left lung: Principal | ICD-10-CM | POA: Diagnosis present

## 2021-01-05 DIAGNOSIS — G620 Drug-induced polyneuropathy: Secondary | ICD-10-CM | POA: Diagnosis not present

## 2021-01-05 DIAGNOSIS — D701 Agranulocytosis secondary to cancer chemotherapy: Secondary | ICD-10-CM

## 2021-01-05 DIAGNOSIS — Z90710 Acquired absence of both cervix and uterus: Secondary | ICD-10-CM

## 2021-01-05 DIAGNOSIS — M84411A Pathological fracture, right shoulder, initial encounter for fracture: Secondary | ICD-10-CM | POA: Diagnosis not present

## 2021-01-05 DIAGNOSIS — S4291XA Fracture of right shoulder girdle, part unspecified, initial encounter for closed fracture: Secondary | ICD-10-CM | POA: Diagnosis not present

## 2021-01-05 DIAGNOSIS — E119 Type 2 diabetes mellitus without complications: Secondary | ICD-10-CM | POA: Diagnosis present

## 2021-01-05 DIAGNOSIS — C801 Malignant (primary) neoplasm, unspecified: Secondary | ICD-10-CM | POA: Diagnosis not present

## 2021-01-05 LAB — RESP PANEL BY RT-PCR (FLU A&B, COVID) ARPGX2
Influenza A by PCR: NEGATIVE
Influenza B by PCR: NEGATIVE
SARS Coronavirus 2 by RT PCR: NEGATIVE

## 2021-01-05 LAB — COMPREHENSIVE METABOLIC PANEL
ALT: 10 U/L (ref 0–44)
AST: 17 U/L (ref 15–41)
Albumin: 2.7 g/dL — ABNORMAL LOW (ref 3.5–5.0)
Alkaline Phosphatase: 103 U/L (ref 38–126)
Anion gap: 8 (ref 5–15)
BUN: 33 mg/dL — ABNORMAL HIGH (ref 8–23)
CO2: 28 mmol/L (ref 22–32)
Calcium: 8.8 mg/dL — ABNORMAL LOW (ref 8.9–10.3)
Chloride: 92 mmol/L — ABNORMAL LOW (ref 98–111)
Creatinine, Ser: 2.03 mg/dL — ABNORMAL HIGH (ref 0.44–1.00)
GFR, Estimated: 27 mL/min — ABNORMAL LOW (ref >60)
Glucose, Bld: 107 mg/dL — ABNORMAL HIGH (ref 70–99)
Potassium: 5 mmol/L (ref 3.5–5.1)
Sodium: 128 mmol/L — ABNORMAL LOW (ref 135–145)
Total Bilirubin: 0.5 mg/dL (ref 0.3–1.2)
Total Protein: 6 g/dL — ABNORMAL LOW (ref 6.5–8.1)

## 2021-01-05 LAB — CBC WITH DIFFERENTIAL/PLATELET
Abs Immature Granulocytes: 0.04 10*3/uL (ref 0.00–0.07)
Basophils Absolute: 0 10*3/uL (ref 0.0–0.1)
Basophils Relative: 1 %
Eosinophils Absolute: 0.1 10*3/uL (ref 0.0–0.5)
Eosinophils Relative: 6 %
HCT: 33.7 % — ABNORMAL LOW (ref 36.0–46.0)
Hemoglobin: 10.4 g/dL — ABNORMAL LOW (ref 12.0–15.0)
Immature Granulocytes: 4 %
Lymphocytes Relative: 13 %
Lymphs Abs: 0.1 10*3/uL — ABNORMAL LOW (ref 0.7–4.0)
MCH: 30.9 pg (ref 26.0–34.0)
MCHC: 30.9 g/dL (ref 30.0–36.0)
MCV: 100 fL (ref 80.0–100.0)
Monocytes Absolute: 0.5 10*3/uL (ref 0.1–1.0)
Monocytes Relative: 44 %
Neutro Abs: 0.3 10*3/uL — CL (ref 1.7–7.7)
Neutrophils Relative %: 32 %
Platelets: 418 10*3/uL — ABNORMAL HIGH (ref 150–400)
RBC: 3.37 MIL/uL — ABNORMAL LOW (ref 3.87–5.11)
RDW: 18.3 % — ABNORMAL HIGH (ref 11.5–15.5)
WBC: 1 10*3/uL — CL (ref 4.0–10.5)
nRBC: 0 % (ref 0.0–0.2)

## 2021-01-05 LAB — URINALYSIS, ROUTINE W REFLEX MICROSCOPIC
Bilirubin Urine: NEGATIVE
Glucose, UA: NEGATIVE mg/dL
Hgb urine dipstick: NEGATIVE
Ketones, ur: NEGATIVE mg/dL
Leukocytes,Ua: NEGATIVE
Nitrite: NEGATIVE
Protein, ur: NEGATIVE mg/dL
Specific Gravity, Urine: 1.009 (ref 1.005–1.030)
pH: 5 (ref 5.0–8.0)

## 2021-01-05 MED ORDER — FENOFIBRATE 160 MG PO TABS
160.0000 mg | ORAL_TABLET | Freq: Every day | ORAL | Status: DC
  Administered 2021-01-06 – 2021-01-13 (×7): 160 mg via ORAL
  Filled 2021-01-05 (×9): qty 1

## 2021-01-05 MED ORDER — ASPIRIN 81 MG PO CHEW
81.0000 mg | CHEWABLE_TABLET | Freq: Every day | ORAL | Status: DC
  Administered 2021-01-06 – 2021-01-10 (×5): 81 mg via ORAL
  Filled 2021-01-05 (×5): qty 1

## 2021-01-05 MED ORDER — LACTATED RINGERS IV BOLUS
1000.0000 mL | Freq: Once | INTRAVENOUS | Status: DC
  Administered 2021-01-06: 1000 mL via INTRAVENOUS

## 2021-01-05 MED ORDER — SODIUM CHLORIDE 0.9 % IV SOLN
2.0000 g | Freq: Once | INTRAVENOUS | Status: AC
  Administered 2021-01-06: 2 g via INTRAVENOUS
  Filled 2021-01-05: qty 2

## 2021-01-05 MED ORDER — FLUTICASONE PROPIONATE 50 MCG/ACT NA SUSP
1.0000 | Freq: Every day | NASAL | Status: DC
  Administered 2021-01-10 – 2021-01-13 (×2): 1 via NASAL
  Filled 2021-01-05: qty 16

## 2021-01-05 MED ORDER — GABAPENTIN 300 MG PO CAPS
300.0000 mg | ORAL_CAPSULE | Freq: Two times a day (BID) | ORAL | Status: DC
  Administered 2021-01-06 – 2021-01-14 (×17): 300 mg via ORAL
  Filled 2021-01-05 (×18): qty 1

## 2021-01-05 MED ORDER — LACTATED RINGERS IV BOLUS
500.0000 mL | Freq: Once | INTRAVENOUS | Status: AC
  Administered 2021-01-05: 500 mL via INTRAVENOUS

## 2021-01-05 MED ORDER — OXYCODONE HCL 5 MG PO TABS: 5.0000 mg | ORAL_TABLET | ORAL | Status: DC | PRN

## 2021-01-05 MED ORDER — PRAVASTATIN SODIUM 20 MG PO TABS
20.0000 mg | ORAL_TABLET | Freq: Every day | ORAL | Status: DC
  Administered 2021-01-06 – 2021-01-13 (×7): 20 mg via ORAL
  Filled 2021-01-05 (×7): qty 1

## 2021-01-05 MED ORDER — INSULIN ASPART 100 UNIT/ML ~~LOC~~ SOLN
0.0000 [IU] | Freq: Every day | SUBCUTANEOUS | Status: DC
  Filled 2021-01-05: qty 0.05

## 2021-01-05 MED ORDER — INSULIN ASPART 100 UNIT/ML ~~LOC~~ SOLN
0.0000 [IU] | Freq: Three times a day (TID) | SUBCUTANEOUS | Status: DC
  Administered 2021-01-08: 2 [IU] via SUBCUTANEOUS
  Administered 2021-01-09 – 2021-01-10 (×2): 3 [IU] via SUBCUTANEOUS
  Administered 2021-01-10: 2 [IU] via SUBCUTANEOUS
  Administered 2021-01-12: 3 [IU] via SUBCUTANEOUS
  Administered 2021-01-12: 2 [IU] via SUBCUTANEOUS

## 2021-01-05 MED ORDER — VANCOMYCIN HCL 1500 MG/300ML IV SOLN
1500.0000 mg | Freq: Once | INTRAVENOUS | Status: AC
  Administered 2021-01-06: 1500 mg via INTRAVENOUS
  Filled 2021-01-05: qty 300

## 2021-01-05 MED ORDER — PANTOPRAZOLE SODIUM 40 MG PO TBEC
40.0000 mg | DELAYED_RELEASE_TABLET | Freq: Every day | ORAL | Status: DC
  Administered 2021-01-06 – 2021-01-14 (×8): 40 mg via ORAL
  Filled 2021-01-05 (×8): qty 1

## 2021-01-05 MED ORDER — OXYCODONE HCL ER 20 MG PO T12A
20.0000 mg | EXTENDED_RELEASE_TABLET | Freq: Three times a day (TID) | ORAL | Status: DC
Start: 2021-01-05 — End: 2021-01-06
  Administered 2021-01-05: 20 mg via ORAL
  Filled 2021-01-05: qty 2

## 2021-01-05 NOTE — Progress Notes (Signed)
A consult was received from an ED physician for Vancomycin and Cefepime per pharmacy dosing.  The patient's profile has been reviewed for ht/wt/allergies/indication/available labs.   A one time order has been placed for Vancomycin 1500 mg and Cefepime 2g.  Further antibiotics/pharmacy consults should be ordered by admitting physician if indicated.                       Thank you,  Gretta Arab PharmD, BCPS Clinical Pharmacist [facility] main pharmacy [phone] 01/05/2021 8:51 PM

## 2021-01-05 NOTE — ED Triage Notes (Signed)
Pt brought from cancer center. Hx right metastatic breast and lung cancer. Currently getting CMF treatment. Been getting thoracentesis. Presented today with inability to urinate. No other urinary s/sx. Last time was 11:45 am, small amount. Pt reports not going at all yesterday. 48oz fluid yesterday. Also reports uncontrolled 'nervous jitters'. Sudden onset SHOB. 72% RA in CC, up to 99% on 2L. No O2 at home. Diminished lung sounds on right side. Last one 6-7 weeks ago.

## 2021-01-05 NOTE — ED Provider Notes (Signed)
[facility] DEPT Provider Note   CSN: [id number] Arrival date & time: 01/05/21  1455     History Chief Complaint  Patient presents with  . Urinary Retention  . Tremors    Mia [name] is a 65 y.o. female.  65yo F w// PMH including metastatic breast cancer, HTN, HLD, T2DM who p/w difficulty urinating as well as shortness of breath.  Patient states that she has only urinated one time, around 11:45 AM this morning, in the last 24 hours.  She does not feel like her bladder is full or that she needs to urinate.  She denies any abdominal pain.  She has been drinking normally including 48 ounces of fluid yesterday.  She denies any history of urinary problems.  No incontinence or saddle anesthesia.  Patient also notes that she has had progressively worsening shortness of breath that feels similar to her previous experience with pleural effusion she eventually required thoracentesis.  She contacted her doctor earlier this week to see if she could have thoracentesis done again.  She denies any chest pain, fever, cough/cold symptoms.  No recent medication changes.  The history is provided by the patient.       Past Medical History:  Diagnosis Date  . Breast cancer metastasized to bone ([place])   . Diabetes mellitus without complication ([place])   . History of radiation therapy 11/18/2018-12/02/2018   lumbar spine and pelvis   Dr [name]  . History of radiation therapy 11/08/2020-11/18/2020   right arm   Dr [name]  . Hyperlipidemia   . Hypertension   . Lymphedema of right arm   . Neuropathy associated with cancer ([place])   . Obesity (BMI 35.0-39.9 without comorbidity)     Patient Active Problem List   Diagnosis Date Noted  . Goals of care, counseling/discussion 09/29/2019  . Paronychia of thumb, left 02/03/2019  . Drug-induced neutropenia ([place]) 12/04/2018  . HTN (hypertension) 06/03/2018  . HLD (hyperlipidemia) 06/03/2018  . Metastatic breast cancer  ([place]) 03/12/2018  . Bone metastases ([place]) 03/12/2018  . Lung metastases ([place]) 03/12/2018  . Osteonecrosis ([place]) 03/12/2018  . Peripheral neuropathy due to chemotherapy ([place]) 03/12/2018  . Diabetes mellitus type 2 in obese ([place]) 03/12/2018  . Hepatic steatosis 03/12/2018  . Aortic atherosclerosis ([place]) 03/12/2018    Past Surgical History:  Procedure Laterality Date  . CATARACT EXTRACTION Left   . HYSTERECTOMY ABDOMINAL WITH SALPINGO-OOPHORECTOMY    . MODIFIED RADICAL MASTECTOMY Right      OB History   No obstetric history on file.     Family History  Problem Relation Age of Onset  . Diabetes Mother   . Breast cancer Mother 75  . Cerebral aneurysm Mother   . Pulmonary embolism Father   . Colon cancer Father 51  . Breast cancer Sister 15       noninvasive  . Breast cancer Paternal Grandmother 72    Social History   Tobacco Use  . Smoking status: Never Smoker  . Smokeless tobacco: Never Used  Vaping Use  . Vaping Use: Never used  Substance Use Topics  . Alcohol use: Not Currently  . Drug use: Never    Home Medications Prior to Admission medications   Medication Sig Start Date End Date Taking? Authorizing Provider  amLODipine (NORVASC) 10 MG tablet Take 10 mg by mouth daily. 09/22/20   [provider]  aspirin 81 MG chewable tablet Chew by mouth daily.    [provider]  COVID-19 mRNA  vaccine, Pfizer, 30 MCG/0.3ML injection AS DIRECTED 08/16/20 08/16/21  Carlyle Basques, MD  fenofibrate (TRICOR) 145 MG tablet TAKE 1 TABLET BY MOUTH EVERY DAY 12/21/20   Cirigliano, Mary K, DO  fluticasone (FLONASE) 50 MCG/ACT nasal spray SPRAY 2 SPRAYS INTO EACH NOSTRIL EVERY DAY 10/03/20   Cirigliano, Garvin Fila, DO  gabapentin (NEURONTIN) 300 MG capsule Take 1 capsule (300 mg total) by mouth 4 (four) times daily. Pt is only taking 2 capsules a day 12/28/20   Magrinat, Virgie Dad, MD  ibuprofen (ADVIL) 800 MG tablet Take 1 tablet (800 mg total) by mouth every 8 (eight) hours  as needed. 10/26/20   Magrinat, Virgie Dad, MD  Insulin Admin Supplies MISC Inject 24 Units into the skin daily.    [provider]  insulin glargine (LANTUS SOLOSTAR) 100 UNIT/ML Solostar Pen INJECT 24 UNITS INTO THE SKIN DAILY 10/17/20   Ronnald Nian, DO  Insulin Pen Needle 31G X 5 MM MISC UAD for Sq inj for DM qd 01/13/20   Libby Maw, MD  losartan (COZAAR) 50 MG tablet Take 1 tablet (50 mg total) by mouth 2 (two) times daily. 12/21/20   Cirigliano, Garvin Fila, DO  metoprolol succinate (TOPROL-XL) 25 MG 24 hr tablet TAKE 1 TABLET BY MOUTH EVERY DAY 10/17/20   Cirigliano, Stanton Kidney K, DO  oxyCODONE (OXY IR/ROXICODONE) 5 MG immediate release tablet Take 1-2 tablets (5-10 mg total) by mouth every 4 (four) hours as needed for severe pain. 12/06/20   Magrinat, Virgie Dad, MD  oxyCODONE (OXYCONTIN) 20 mg 12 hr tablet Take 1 tablet (20 mg total) by mouth every 8 (eight) hours. 12/01/20   Magrinat, Virgie Dad, MD  pantoprazole (PROTONIX) 40 MG tablet TAKE 1 TABLET BY MOUTH EVERY DAY 01/02/21   Cirigliano, Mary K, DO  pioglitazone (ACTOS) 15 MG tablet TAKE 1 TABLET BY MOUTH EVERY DAY 10/06/20   Cirigliano, Mary K, DO  pravastatin (PRAVACHOL) 20 MG tablet TAKE 1 TABLET BY MOUTH EVERY DAY 12/21/20   Cirigliano, Garvin Fila, DO  prochlorperazine (COMPAZINE) 10 MG tablet Take 1 tablet (10 mg total) by mouth every 6 (six) hours as needed (Nausea or vomiting). 10/26/20   Magrinat, Virgie Dad, MD  sitaGLIPtin-metformin (JANUMET) 50-1000 MG tablet Take 1 tablet by mouth daily with breakfast. 10/06/20   Ronnald Nian, DO    Allergies    Morphine and related  Review of Systems   Review of Systems All other systems reviewed and are negative except that which was mentioned in HPI  Physical Exam Updated Vital Signs BP 110/66 (BP Location: Right Arm)   Pulse 76   Temp 98.5 F (36.9 C) (Oral)   Resp 20   Ht 5\' 4"  (1.626 m)   Wt 99.3 kg   LMP 10/01/2006 Comment: Total Hysterectomy  SpO2 100%   BMI 37.59 kg/m    Physical Exam Vitals and nursing note reviewed.  Constitutional:      General: She is not in acute distress.    Appearance: Normal appearance. She is obese.     Comments: Chronically ill appearing  HENT:     Head: Normocephalic and atraumatic.  Eyes:     Conjunctiva/sclera: Conjunctivae normal.  Cardiovascular:     Rate and Rhythm: Normal rate and regular rhythm.     Heart sounds: Normal heart sounds. No murmur heard.   Pulmonary:     Effort: Pulmonary effort is normal.     Comments: Severely diminished breath sounds L lung Abdominal:  General: Abdomen is flat. Bowel sounds are normal. There is no distension.     Palpations: Abdomen is soft.     Tenderness: There is no abdominal tenderness.  Musculoskeletal:     Comments: Lymphedema RUE; trace BLE edema  Skin:    General: Skin is warm and dry.     Coloration: Skin is pale.  Neurological:     Mental Status: She is alert and oriented to person, place, and time.     Comments: fluent  Psychiatric:        Mood and Affect: Mood normal.        Behavior: Behavior normal.     ED Results / Procedures / Treatments   Labs (all labs ordered are listed, but only abnormal results are displayed) Labs Reviewed  COMPREHENSIVE METABOLIC PANEL - Abnormal; Notable for the following components:      Result Value   Sodium 128 (*)    Chloride 92 (*)    Glucose, Bld 107 (*)    BUN 33 (*)    Creatinine, Ser 2.03 (*)    Calcium 8.8 (*)    Total Protein 6.0 (*)    Albumin 2.7 (*)    GFR, Estimated 27 (*)    All other components within normal limits  CBC WITH DIFFERENTIAL/PLATELET - Abnormal; Notable for the following components:   WBC 1.0 (*)    RBC 3.37 (*)    Hemoglobin 10.4 (*)    HCT 33.7 (*)    RDW 18.3 (*)    Platelets 418 (*)    Neutro Abs 0.3 (*)    Lymphs Abs 0.1 (*)    All other components within normal limits  RESP PANEL BY RT-PCR (FLU A&B, COVID) ARPGX2  URINE CULTURE  CULTURE, BLOOD (ROUTINE X 2)  CULTURE,  BLOOD (ROUTINE X 2)  MRSA PCR SCREENING  URINALYSIS, ROUTINE W REFLEX MICROSCOPIC  LACTIC ACID, PLASMA  PROCALCITONIN  PROCALCITONIN  BASIC METABOLIC PANEL  CBC  HEMOGLOBIN A1C    EKG None  Radiology DG Chest 2 View  Result Date: 01/05/2021 CLINICAL DATA:  Worsening shortness of breath with history of right-sided metastatic breast cancer and lung cancer. EXAM: CHEST - 2 VIEW COMPARISON:  November 21, 2020 FINDINGS: There is complete opacification of the left hemithorax. This is increased in severity when compared to the prior study. The right lung is clear. The heart size and mediastinal contours are within normal limits. Marked severity calcification of the aortic arch is noted. Radiopaque surgical clips are seen overlying the lateral aspect of the right hemithorax. A linear cortical defect is seen overlying the proximal shaft of the right humerus. This represents a new finding when compared to the prior study. IMPRESSION: 1. Complete opacification of the left hemithorax, likely secondary to an extensive amount of atelectasis, infiltrate and associated pleural effusion. Correlation with chest CT is recommended. 2. Additional findings suggestive of a nondisplaced fracture of the proximal right humerus. Correlation with physical examination and patient history is recommended. Electronically Signed   By: Virgina Norfolk M.D.   On: 01/05/2021 17:41    Procedures Procedures  CRITICAL CARE Performed by: Wenda Overland Marleny Faller   Total critical care time: 30 minutes  Critical care time was exclusive of separately billable procedures and treating other patients.  Critical care was necessary to treat or prevent imminent or life-threatening deterioration.  Critical care was time spent personally by me on the following activities: development of treatment plan with patient and/or surrogate as well as nursing,  discussions with consultants, evaluation of patient's response to treatment, examination  of patient, obtaining history from patient or surrogate, ordering and performing treatments and interventions, ordering and review of laboratory studies, ordering and review of radiographic studies, pulse oximetry and re-evaluation of patient's condition.  Medications Ordered in ED Medications - No data to display  ED Course  I have reviewed the triage vital signs and the nursing notes.  Pertinent labs & imaging results that were available during my care of the patient were reviewed by me and considered in my medical decision making (see chart for details).    MDM Rules/Calculators/A&P                          Pt on 2L [place] on exam, initially 72% on RA.  Lab work notable for sodium 128, BUN 33, creatinine 2.0 which is elevated from baseline, neutropenia with WBC 1.0, hemoglobin 10.4, UA without infection, COVID/flu negative.  Chest x-ray shows complete opacification of left lung, potentially pleural effusion versus infiltrate.  After discussion with [facility] hospitalist, Dr. Flossie Buffy, have ordered broad abx, Vanc and cefepime. Pt admitted for thoracentesis and further treatment.  Of note, CXR w/ possible proximal R humerus fx. Ordered XR and informed [facility]. Final Clinical Impression(s) / ED Diagnoses Final diagnoses:  None    Rx / DC Orders ED Discharge Orders    None       Jamahl Lemmons, Wenda Overland, MD 01/05/21 2354

## 2021-01-05 NOTE — ED Notes (Signed)
Coming from cancer center-states she has a known pleural effusion-unable to urinate which is new

## 2021-01-05 NOTE — H&P (Addendum)
History and Physical    Mia [name] ALP:[id number] DOB: [birth year] DOA: 01/05/2021  PCP: Ronnald Nian, DO  Patient coming from: Oncology office, husband at bedside  I have personally briefly reviewed patient's old medical records in [place]  Chief Complaint: inability to urinate and increasing shortness of breath and worsening tremors  HPI: Mia [name] is a 65 y.o. female with medical history significant for stage IV metastatic breast cancer to lung and bones s/p right mastectomy, radiation on chemotherapy, history of malignant pleural effusion requiring thoracentesis hypertension, insulin-dependent type 2 diabetes who presents with concerns of inability to urinate, increasing shortness of breath and worsening tremors.  Patient reports that for the past 24 hours she has only been able to urinate once earlier this morning despite trying to push herself to drink more fluids.  Prior to that, she denies any dysuria, increased urinary urgency, frequency.  No abdominal pain.  States there has been 2 instances in the remote past where she had difficulty urinating in the morning but it resolve on its own.  She denies any fever.  No nausea, vomiting or diarrhea. Patient chronically has some shortness of breath at baseline but for the past 2 weeks this has been progressively worsening and she is having difficulty ambulating short distances without dyspnea.  She had thoracentesis on 11/21/2020 with 1.5 L malignant effusion removed.  Patient also has chronic tremors but has noted worsening in the past 2 to 3 days.  She also notes chronic right-sided shoulder pain that started in January 2022 and has progressively gotten to the point where she can no longer lift her arm.  She was supposed to get an MRI to follow-up today.  In the ED, she was afebrile, borderline hypotensive, and had oxygen desaturation down to 78% requiring 2 L of O2.  Labs notable for significant neutropenia with  WBC of 1, absolute neutrophils 0.3.  Hemoglobin 10.4, HCT of 33.7, Na of 128, Creatinine of 2.03 from 1.24.   CXR shows complete opacification of the left hemithorax  Post-void residue was zero and pt ultimately urinated in ED with UA pending.  Review of Systems: Constitutional: No Weight Change, No Fever ENT/Mouth: No sore throat, No Rhinorrhea Eyes: No Eye Pain, No Vision Changes Cardiovascular: No Chest Pain, + SOB, No PND, + Dyspnea on Exertion, + Orthopnea, , + chronic Edema, No Palpitations Respiratory: No Cough, No Sputum, No Wheezing, +Dyspnea  Gastrointestinal: No Nausea, No Vomiting, No Diarrhea, No Constipation, No Pain Genitourinary: no Urinary Incontinence, No Urgency, No Flank Pain Musculoskeletal: No Arthralgias, No Myalgias Skin: No Skin Lesions, No Pruritus, Neuro: no Weakness, + chronic peripheral Numbness,   Psych: No Anxiety/Panic, No Depression, +decrease appetite Heme/Lymph: No Bruising, No Bleeding  Past Medical History:  Diagnosis Date  . Breast cancer metastasized to bone ([place])   . Diabetes mellitus without complication ([place])   . History of radiation therapy 11/18/2018-12/02/2018   lumbar spine and pelvis   Dr [name]  . History of radiation therapy 11/08/2020-11/18/2020   right arm   Dr [name]  . Hyperlipidemia   . Hypertension   . Lymphedema of right arm   . Neuropathy associated with cancer ([place])   . Obesity (BMI 35.0-39.9 without comorbidity)     Past Surgical History:  Procedure Laterality Date  . CATARACT EXTRACTION Left   . HYSTERECTOMY ABDOMINAL WITH SALPINGO-OOPHORECTOMY    . MODIFIED RADICAL MASTECTOMY Right      reports that she has never smoked. She has  never used smokeless tobacco. She reports previous alcohol use. She reports that she does not use drugs. Social History  Allergies  Allergen Reactions  . Morphine And Related Anaphylaxis    Family History  Problem Relation Age of Onset  . Diabetes Mother   . Breast cancer  Mother 79  . Cerebral aneurysm Mother   . Pulmonary embolism Father   . Colon cancer Father 55  . Breast cancer Sister 25       noninvasive  . Breast cancer Paternal Grandmother 54     Prior to Admission medications   Medication Sig Start Date End Date Taking? Authorizing Provider  amLODipine (NORVASC) 10 MG tablet Take 10 mg by mouth daily. 09/22/20  Yes [provider]  aspirin 81 MG chewable tablet Chew 81 mg by mouth daily.   Yes [provider]  fenofibrate (TRICOR) 145 MG tablet TAKE 1 TABLET BY MOUTH EVERY DAY Patient taking differently: Take 145 mg by mouth daily. 12/21/20  Yes Cirigliano, Mary K, DO  fluticasone (FLONASE) 50 MCG/ACT nasal spray SPRAY 2 SPRAYS INTO EACH NOSTRIL EVERY DAY Patient taking differently: Place 1 spray into both nostrils daily. 10/03/20  Yes Cirigliano, Mary K, DO  gabapentin (NEURONTIN) 300 MG capsule Take 1 capsule (300 mg total) by mouth 4 (four) times daily. Pt is only taking 2 capsules a day Patient taking differently: Take 300 mg by mouth 2 (two) times daily. 12/28/20  Yes Magrinat, Virgie Dad, MD  ibuprofen (ADVIL) 800 MG tablet Take 800 mg by mouth every 8 (eight) hours as needed for moderate pain. 10/26/20  Yes Magrinat, Virgie Dad, MD  insulin glargine (LANTUS SOLOSTAR) 100 UNIT/ML Solostar Pen INJECT 24 UNITS INTO THE SKIN DAILY Patient taking differently: Inject 24 Units into the skin daily. 10/17/20  Yes Cirigliano, Mary K, DO  losartan (COZAAR) 50 MG tablet Take 1 tablet (50 mg total) by mouth 2 (two) times daily. 12/21/20  Yes Cirigliano, Mary K, DO  metoprolol succinate (TOPROL-XL) 25 MG 24 hr tablet TAKE 1 TABLET BY MOUTH EVERY DAY Patient taking differently: Take 25 mg by mouth daily. 10/17/20  Yes Cirigliano, Mary K, DO  oxyCODONE (OXY IR/ROXICODONE) 5 MG immediate release tablet Take 1-2 tablets (5-10 mg total) by mouth every 4 (four) hours as needed for severe pain. 12/06/20  Yes Magrinat, Virgie Dad, MD  oxyCODONE (OXYCONTIN) 20 mg  12 hr tablet Take 1 tablet (20 mg total) by mouth every 8 (eight) hours. 12/01/20  Yes Magrinat, Virgie Dad, MD  pantoprazole (PROTONIX) 40 MG tablet TAKE 1 TABLET BY MOUTH EVERY DAY Patient taking differently: Take 40 mg by mouth daily. 01/02/21  Yes Cirigliano, Mary K, DO  pioglitazone (ACTOS) 15 MG tablet TAKE 1 TABLET BY MOUTH EVERY DAY Patient taking differently: Take 15 mg by mouth daily. 10/06/20  Yes Cirigliano, Mary K, DO  pravastatin (PRAVACHOL) 20 MG tablet TAKE 1 TABLET BY MOUTH EVERY DAY Patient taking differently: Take 20 mg by mouth daily. 12/21/20  Yes Cirigliano, Garvin Fila, DO  sitaGLIPtin-metformin (JANUMET) 50-1000 MG tablet Take 1 tablet by mouth daily with breakfast. 10/06/20  Yes Cirigliano, Mary K, DO  Wheat Dextrin-Calcium (BENEFIBER PLUS CALCIUM) CHEW Chew 2 tablets by mouth daily.   Yes [provider]  COVID-19 mRNA vaccine, Pfizer, 30 MCG/0.3ML injection AS DIRECTED 08/16/20 08/16/21  Carlyle Basques, MD  Insulin Admin Supplies MISC Inject 24 Units into the skin daily.    [provider]  Insulin Pen Needle 31G X 5 MM MISC  UAD for Sq inj for DM qd 01/13/20   Libby Maw, MD  prochlorperazine (COMPAZINE) 10 MG tablet Take 1 tablet (10 mg total) by mouth every 6 (six) hours as needed (Nausea or vomiting). Patient not taking: No sig reported 10/26/20   Magrinat, Virgie Dad, MD    Physical Exam: Vitals:   01/05/21 1845 01/05/21 1916 01/05/21 1950 01/05/21 2023  BP: 97/61  (!) 88/62 102/62  Pulse: 82 77 77   Resp: 18 18 17    Temp:      TempSrc:      SpO2: 99% 100% 98%   Weight:      Height:        Constitutional: NAD, calm, comfortable, chronically ill-appearing elderly female with alopecia laying at 30 degree incline in bed Vitals:   01/05/21 1845 01/05/21 1916 01/05/21 1950 01/05/21 2023  BP: 97/61  (!) 88/62 102/62  Pulse: 82 77 77   Resp: 18 18 17    Temp:      TempSrc:      SpO2: 99% 100% 98%   Weight:      Height:       Eyes: PERRL, lids  and conjunctivae normal ENMT: Mucous membranes are moist.  Neck: normal, supple, Respiratory: No aeration heard in left lung, no wheezing or crackle. Normal respiratory effort on 2 L.  Able to speak full sentences.  No accessory muscle use.  Cardiovascular: Regular rate and rhythm, no murmurs / rubs / gallops.  +2 pitting edema up to mid pretibial region bilaterally.  Circumferential nonpitting edema of the right upper extremity which is chronic. Abdomen: no tenderness, no masses palpated.  Bowel sounds positive.  Musculoskeletal: no clubbing / cyanosis. No joint deformity upper and lower extremities. Unable to lift right upper extremity due to pain. Skin: no rashes, lesions, ulcers. No induration Neurologic: CN 2-12 grossly intact. Sensation intact,Strength 5/5 except for right UE due to pain. Psychiatric: Normal judgment and insight. Alert and oriented x 3. Normal mood.     Labs on Admission: I have personally reviewed following labs and imaging studies  CBC: Recent Labs  Lab 01/05/21 1911  WBC 1.0*  NEUTROABS 0.3*  HGB 10.4*  HCT 33.7*  MCV 100.0  PLT 017*   Basic Metabolic Panel: Recent Labs  Lab 01/05/21 1911  NA 128*  K 5.0  CL 92*  CO2 28  GLUCOSE 107*  BUN 33*  CREATININE 2.03*  CALCIUM 8.8*   GFR: Estimated Creatinine Clearance: 32 mL/min (A) (by C-G formula based on SCr of 2.03 mg/dL (H)). Liver Function Tests: Recent Labs  Lab 01/05/21 1911  AST 17  ALT 10  ALKPHOS 103  BILITOT 0.5  PROT 6.0*  ALBUMIN 2.7*   No results for input(s): LIPASE, AMYLASE in the last 168 hours. No results for input(s): AMMONIA in the last 168 hours. Coagulation Profile: No results for input(s): INR, PROTIME in the last 168 hours. Cardiac Enzymes: No results for input(s): CKTOTAL, CKMB, CKMBINDEX, TROPONINI in the last 168 hours. BNP (last 3 results) No results for input(s): PROBNP in the last 8760 hours. HbA1C: No results for input(s): HGBA1C in the last 72  hours. CBG: No results for input(s): GLUCAP in the last 168 hours. Lipid Profile: No results for input(s): CHOL, HDL, LDLCALC, TRIG, CHOLHDL, LDLDIRECT in the last 72 hours. Thyroid Function Tests: No results for input(s): TSH, T4TOTAL, FREET4, T3FREE, THYROIDAB in the last 72 hours. Anemia Panel: No results for input(s): VITAMINB12, FOLATE, FERRITIN, TIBC, IRON, RETICCTPCT in the  last 72 hours. Urine analysis: No results found for: COLORURINE, APPEARANCEUR, LABSPEC, [place], GLUCOSEU, HGBUR, BILIRUBINUR, KETONESUR, PROTEINUR, UROBILINOGEN, NITRITE, LEUKOCYTESUR  Radiological Exams on Admission: DG Chest 2 View  Result Date: 01/05/2021 CLINICAL DATA:  Worsening shortness of breath with history of right-sided metastatic breast cancer and lung cancer. EXAM: CHEST - 2 VIEW COMPARISON:  November 21, 2020 FINDINGS: There is complete opacification of the left hemithorax. This is increased in severity when compared to the prior study. The right lung is clear. The heart size and mediastinal contours are within normal limits. Marked severity calcification of the aortic arch is noted. Radiopaque surgical clips are seen overlying the lateral aspect of the right hemithorax. A linear cortical defect is seen overlying the proximal shaft of the right humerus. This represents a new finding when compared to the prior study. IMPRESSION: 1. Complete opacification of the left hemithorax, likely secondary to an extensive amount of atelectasis, infiltrate and associated pleural effusion. Correlation with chest CT is recommended. 2. Additional findings suggestive of a nondisplaced fracture of the proximal right humerus. Correlation with physical examination and patient history is recommended. Electronically Signed   By: Virgina Norfolk M.D.   On: 01/05/2021 17:41      Assessment/Plan  Acute hypoxic respiratory failure secondary to left pleural effusion Admitted on 2 L of O2.  Maintain O2 greater than 92% Large  left-sided pleural effusion noted on chest x-ray.  Unable to obtain contrast CT due to worsening renal insufficiency. Last thoracentesis with malignant fluid on 11/21/2020 with 1.5 L removed  Obtain thoracentesis with fluid study-NPO past midnight  Neutropenia WBC of 1, absolute neutrophils 0.3 Afebrile but concern for sepsis given that patient was also hypotensive and unable to verify pleural effusion further with CT chest, could not rule out any consolidation. Start empiric IV antibiotics with vancomycin and cefepime and can de-escalate accordingly. Obtain lactic acid, procalcitonin Blood culture pending Urinalysis pending  Hypotension Due to volume depletion.  Give small 500 cc LR bolus-monitor fluid status closely given large pleural effusion. Hold all antihypertensives-amlodipine, losartan, metoprolol  Hyponatremia Secondary to volume depletion.  Follow with repeat labs in the morning after fluids  AKI Creatinine of 2.03 on admission from prior of 1.24 Due to volume depletion.  Fluid as above. Monitor with repeat labs in the morning  History of stage IV metastatic breast cancer to lungs and bones Follows with oncologist Dr. [name] - need consult in the morning  Chemo-induced peripheral neuropathy Continue gabapentin  Type 2 diabetes Takes 24 units Lantus at home Hold Actos and [place] on moderate SSI for now since she will be NPO at midnight for thoracentesis tomorrow  Hyperlipidemia Continue statin, fenofibrate  Right shoulder pain/pathologic fx of proximal right humerus with angulation Patient reports right shoulder pain and decreased range of motion since January 2022. Has known metastatic disease to peri-glenoid region of right scapula back in 2019.   She is being followed up outpatient by oncology and originally had MRI planned for today. Could consider MRI inpatient depending on her length of stay-unable to get this tonight since she cannot lay flat due to  dyspnea -now with new humerus fracture-consider ortho consult. Pt not likely to tolerate sling due to severe shoulder pain  DVT prophylaxis:.SCDs  Code Status: Full Family Communication: Plan discussed with patient and husband at bedside  disposition Plan: Home with at least 2 midnight stays  Consults called:  Admission status: inpatient Level of care: Progressive  Status is: Inpatient  Remains inpatient  appropriate because:Inpatient level of care appropriate due to severity of illness   Dispo: The patient is from: Home              Anticipated d/c is to: Home              Patient currently is not medically stable to d/c.   Difficult to place patient No         Orene Desanctis DO [facility]   If 7PM-7AM, please contact night-coverage [email or web address]   01/05/2021, 9:17 PM

## 2021-01-05 NOTE — ED Notes (Signed)
Bladder scan resulted in (0) ml

## 2021-01-05 NOTE — Telephone Encounter (Signed)
Returned call to pt . Pt states she was unable to void yesterday except for a few drops. Pt states today she has only voided normally one time. Pt to come in to see Wilber Bihari NP for symptom management. Pt acknowledged.

## 2021-01-05 NOTE — Progress Notes (Signed)
[facility]  [phone] Fax:(336) [phone]    ID: Mia [name] DOB: [birth year]  MR#: [id number]  YIA#:[id number]  Patient Care Team: Ronnald Nian, DO as PCP - General (Family Medicine) Magrinat, Virgie Dad, MD as Consulting Physician (Oncology) [name], [name] PA   CHIEF COMPLAINT: Estrogen receptor positive stage IV breast cancer (s/p right mastectomy)  CURRENT TREATMENT: CMF chemotherapy;    INTERVAL HISTORY: Mia [name] returns today for follow-up and treatment of her estrogen receptor positive stage IV breast cancer.   She is receiving CMF chemotherapy every three weeks.  Her most recent cycle was given on 12/22/2020 and she says she tolerated it well.  Anvita could not remember the dates she received the chemo, or how often she receives it.     REVIEW OF SYSTEMS: Mia [name] is here today for urgent evaluation of her inability to urinate.  She says that this started two days ago.  At first, she increased her fluid intake and was able to urinate easier, however now, she in the past 24 hours she has not felt the need to urinate despite increasing her fluid intake.  She went today at 11 am, but made herself go and didn't feel the need to go.  Today she noted she is increasingly off with her balance, and is having increased tremors and twitching.  She is short of breath, worse with exertion, which happens frequently when her pleural effusion needs to be drained.  She denies fever, chills,.  She drinks about 20 ounces of fluid per day, and notes her urine was not increasingly concentrated.     COVID 19 VACCINATION STATUS: [place] x2, with the booster 08/16/2020   HISTORY OF CURRENT ILLNESS: From the original intake note:  We have reviewed the medical records from [place], which is the source of the information below:  Dell Hurtubise was initially diagnosed with Stage IIIA (T2N2M0) invasive ductal carcinoma, estrogen  receptor positive and HER2 negative right breast cancer in 2000. She underwent right mastectomy, with 4 positive metastatic lymph nodes. She had chemotherapy with taxol and cytoxan and fulvestrant in the past. She had radiation and completed 5 years of tamoxifen.   She was diagnosed with Stage IV disease 04/18/2005 with metastases to the bone, lung, and additional nodes. She was on capecitabine but was discontinued after rising CA 27-29 and scans.   She started anastrozole and everolimus with Delton See in April 2014. She tolerated this well, but this was discontinued in July 2014 due to abnormal blood tests, hyperlipidemia and hyperglycemia.  She began Abraxane 130m /m2 and Xgeva in August 2014 weekly x3 with 1 week off. She tolerated this treatment well. She discontinued Xgeva January 2016 due to osteonecrosis of the jaw and mouth issues. She continued abraxane alone until her last dose on 11/18/2015 due to neuropathy and disease progression with restaging studies on 11/23/2015 with a CT chest abdomen and pelvis and one scan showing skull, sternal and femoral lesions.   She was switched to gemcitabine starting 01/20/2016 weekly x3 and 1 week off. This was discontinued on 06/15/2016 due to a port related jugular clot on the right side. She was given Lovenox for 3 months.  She was switched to Letrozole 2.5 mg and palbociclib 125 mg starting 07/13/2016. She tolerated this well. Restaging studies with CT chest abdomen and pelvis and bone scan on 07/01/2017 showed stable/ improving lesions. CA 27-29 was stable.  During the last few months of follow up in [place]  she completed restaging studies with a CT chest abdomen and pelvis at [facility] on 12/17/2017 showing: A 6 mm pulmonary nodule in the left upper lobe laterally. Progression of metastatic disease to the bones at the L4 and L5 vertebral bodies. Sclerotic metastases at T8, T10, an T11, are similar to previous exams. Sclerotic metastases in the sternum  stable. Hepatic steatosis. Aortic atherosclerosis.  Most recent CA-30-29 (January 2019) was 58.  Most recent hemoglobin A1c was 7.1 according to the patient  The patient's subsequent history is as detailed below.   PAST MEDICAL HISTORY: Past Medical History:  Diagnosis Date  . Breast cancer metastasized to bone ([place])   . Diabetes mellitus without complication ([place])   . History of radiation therapy 11/18/2018-12/02/2018   lumbar spine and pelvis   Dr [name]  . History of radiation therapy 11/08/2020-11/18/2020   right arm   Dr [name]  . Hyperlipidemia   . Hypertension   . Lymphedema of right arm   . Neuropathy associated with cancer ([place])   . Obesity (BMI 35.0-39.9 without comorbidity)      PAST SURGICAL HISTORY: Past Surgical History:  Procedure Laterality Date  . CATARACT EXTRACTION Left   . HYSTERECTOMY ABDOMINAL WITH SALPINGO-OOPHORECTOMY    . MODIFIED RADICAL MASTECTOMY Right      FAMILY HISTORY: Family History  Problem Relation Age of Onset  . Diabetes Mother   . Breast cancer Mother 64  . Cerebral aneurysm Mother   . Pulmonary embolism Father   . Colon cancer Father 11  . Breast cancer Sister 16       noninvasive  . Breast cancer Paternal Grandmother 78   The patient reports she had negative genetic testing at [facility] 2014, report not available. --The patient's father died at age 72 due to a PE, and he also had a history of colon cancer diagnosed at age 30. The patient's mother died at age 68 due to diabetes and survived a cerebral aneurysm. The patient's mother also had a history of breast cancer diagnosed at age 10.  The patient has no brothers and 1 sister. The patient's sister was diagnosed with non invasive breast cancer at age 33. There was a paternal grandmother diagnosed with breast cancer at age 32. The patient denies a family history of ovarian cancer.    GYNECOLOGIC HISTORY:  Patient's last menstrual period was 10/01/2006. Menarche:  65 years old Age at first live birth: 65 years old She is [place] P2.  She is status post hysterectomy with bilateral salpingo- oophorectomy 12/23/2006 with benign pathology ([id number]) She never used HRT.    SOCIAL HISTORY:  Mia [name] is disabled due to her breast cancer. She used to be Surveyor, quantity for a cardiology office. Her husband, [name] is in the process of retiring. He is a Electrical engineer for a power company. The patient's daughter, [name], lives in [place] and works as a Teaching laboratory technician. The patient's second daughter, [name] is a stay at home mom and is a special needs teacher, who will soon move to [facility] [place]). The patient plans on attending [employer].     ADVANCED DIRECTIVES: In the absence of any documentation to the contrary, the patient's spouse is their HCPOA.    HEALTH MAINTENANCE: Social History   Tobacco Use  . Smoking status: Never Smoker  . Smokeless tobacco: Never Used  Vaping Use  . Vaping Use: Never used  Substance Use Topics  . Alcohol use: Not Currently  .  Drug use: Never    Colonoscopy: 2017   PAP:  Bone density: never  Mammogram: 2017   Allergies  Allergen Reactions  . Morphine And Related Anaphylaxis    Current Outpatient Medications  Medication Sig Dispense Refill  . amLODipine (NORVASC) 10 MG tablet Take 10 mg by mouth daily.    Marland Kitchen aspirin 81 MG chewable tablet Chew by mouth daily.    Marland Kitchen COVID-19 mRNA vaccine, Pfizer, 30 MCG/0.3ML injection AS DIRECTED .3 mL 0  . fenofibrate (TRICOR) 145 MG tablet TAKE 1 TABLET BY MOUTH EVERY DAY 90 tablet 2  . fluticasone (FLONASE) 50 MCG/ACT nasal spray SPRAY 2 SPRAYS INTO EACH NOSTRIL EVERY DAY 48 mL 1  . gabapentin (NEURONTIN) 300 MG capsule Take 1 capsule (300 mg total) by mouth 4 (four) times daily. Pt is only taking 2 capsules a day 180 capsule 0  . ibuprofen (ADVIL) 800 MG tablet Take 1 tablet (800 mg total) by mouth every 8 (eight) hours as needed. 30 tablet  0  . Insulin Admin Supplies MISC Inject 24 Units into the skin daily.    . insulin glargine (LANTUS SOLOSTAR) 100 UNIT/ML Solostar Pen INJECT 24 UNITS INTO THE SKIN DAILY 15 mL 1  . Insulin Pen Needle 31G X 5 MM MISC UAD for Sq inj for DM qd 100 each PRN  . losartan (COZAAR) 50 MG tablet Take 1 tablet (50 mg total) by mouth 2 (two) times daily. 180 tablet 3  . metoprolol succinate (TOPROL-XL) 25 MG 24 hr tablet TAKE 1 TABLET BY MOUTH EVERY DAY 90 tablet 1  . oxyCODONE (OXY IR/ROXICODONE) 5 MG immediate release tablet Take 1-2 tablets (5-10 mg total) by mouth every 4 (four) hours as needed for severe pain. 60 tablet 0  . oxyCODONE (OXYCONTIN) 20 mg 12 hr tablet Take 1 tablet (20 mg total) by mouth every 8 (eight) hours. 90 tablet 0  . pantoprazole (PROTONIX) 40 MG tablet TAKE 1 TABLET BY MOUTH EVERY DAY 90 tablet 0  . pioglitazone (ACTOS) 15 MG tablet TAKE 1 TABLET BY MOUTH EVERY DAY 90 tablet 1  . pravastatin (PRAVACHOL) 20 MG tablet TAKE 1 TABLET BY MOUTH EVERY DAY 90 tablet 2  . prochlorperazine (COMPAZINE) 10 MG tablet Take 1 tablet (10 mg total) by mouth every 6 (six) hours as needed (Nausea or vomiting). 30 tablet 1  . sitaGLIPtin-metformin (JANUMET) 50-1000 MG tablet Take 1 tablet by mouth daily with breakfast. 90 tablet 1   No current facility-administered medications for this visit.    OBJECTIVE: White woman using a cane Vitals:   01/05/21 1404  BP: 117/87  [name]: 89  Resp: 18  Temp: (!) 97.5 F (36.4 C)  SpO2: (!) 73%   Wt Readings from Last 3 Encounters:  01/05/21 219 lb (99.3 kg)  12/01/20 215 lb 6.4 oz (97.7 kg)  11/18/20 219 lb 12.8 oz (99.7 kg)   Body mass index is 37.59 kg/m.   ECOG FS:2 - Symptomatic, <50% confined to bed  GENERAL: Patient is chronically ill appearing, however appears more unwell and short of breath than I am accustomed to seeing her. HEENT:  Sclerae anicteric.  Oropharynx clear and moist. No ulcerations or evidence of oropharyngeal candidiasis.  Neck is supple.  NODES:  No cervical, supraclavicular, or axillary lymphadenopathy palpated.  BREAST EXAM:  Deferred. LUNGS:  Left lung diminished, clear on right. No wheezes or rhonchi. HEART:  Regular rate and rhythm. No murmur appreciated. ABDOMEN:  Soft, nontender.  Positive, normoactive bowel sounds. No  organomegaly palpated. MSK:  No focal spinal tenderness to palpation.  EXTREMITIES:  No peripheral edema.   SKIN:  Clear with no obvious rashes or skin changes. No nail dyscrasia. NEURO: Patient is twitching and jerking more involuntarily (which is a new exam finding) She also has difficulty with remembering her treatment dates which is new as well (she normally does not have difficulty with her memory)    LAB RESULTS:  CMP   No results found for: TOTALPROTELP, ALBUMINELP, A1GS, A2GS, BETS, BETA2SER, GAMS, MSPIKE, SPEI  No results found for: KPAFRELGTCHN, LAMBDASER, KAPLAMBRATIO  Lab Results  Component Value Date   WBC 8.4 12/22/2020   NEUTROABS 5.3 12/22/2020   HGB 8.5 (L) 12/22/2020   HCT 26.5 (L) 12/22/2020   MCV 99.3 12/22/2020   PLT 763 (H) 12/22/2020    No results found for: LABCA2  No components found for: [id number]  No results for input(s): INR in the last 168 hours.  No results found for: LABCA2  No results found for: GEZ662  No results found for: HUT654  No results found for: YTK354  Lab Results  Component Value Date   CA2729 193.1 (H) 12/22/2020    No components found for: HGQUANT  No results found for: CEA1 / No results found for: CEA1   No results found for: AFPTUMOR  No results found for: CHROMOGRNA  No results found for: HGBA, HGBA2QUANT, HGBFQUANT, HGBSQUAN (Hemoglobinopathy evaluation)   No results found for: LDH  No results found for: IRON, TIBC, IRONPCTSAT (Iron and TIBC)  No results found for: FERRITIN  Urinalysis No results found for: COLORURINE, APPEARANCEUR, LABSPEC, PHURINE, GLUCOSEU, HGBUR, BILIRUBINUR, KETONESUR,  PROTEINUR, UROBILINOGEN, NITRITE, LEUKOCYTESUR   STUDIES: Refuses mammography at this point (12/23/2019)   ELIGIBLE FOR AVAILABLE RESEARCH PROTOCOL: no   ASSESSMENT: 65 y.o. [place], [place] woman with stage IV breast cancer, as follows:  (1) status post right mastectomy in 2000, for a pT2 pN2, stage IIIA invasive ductal carcinoma, estrogen receptor positive, progesterone receptor not tested, HER-2 negative (0) by immunohistochemistry  (a) adjuvant chemotherapy with doxorubicin and cyclophosphamide in dose dense fashion x4 followed by paclitaxel in dose dense fashion x4  (b) adjuvant radiation: 30 doses  (c) antiestrogens: Tamoxifen for 5 years, completed 2005  (2) METASTASTIC DISEASE: 2008, involving bones, lungs, and lymph nodes  (a) CA 27-29 is informative  (b) CT of the chest abdomen and pelvis in [facility] finds stable sclerotic metastases (T8, T10, T11, sternum, L4, L5); 0.6 cm left upper lobe lung nodule stable  (3)  prior anti-estrogen treatments:  (a) fulvestrant--progression  (b) exemestane/everolimus--hyperlipidemia, hyperglycemia  (4) prior chemotherapy treatments:  (a) capecitabine: progression  (b) Abraxane, August 2014 through 11/18/2015: good response but stopped due to neuropathy  (c) gemcitabine 01/20/2016--?; multiple interruptions secondary to infections  (5) radiation therapy:  (a) T spine and Right femur, completed 01/10/2016  (6) letrozole/ palbociclib started 07/13/2016, discontinued 10/26/2020 with evidence of progression  (7) bone treatment:  (a) denosumab/Xgeva--discontinued January 2016 due to osteonecrosis of the jaw  (8) cancer associated pain: Updated 12/01/2020 (a) OxyIR 5 mg QID PRN  (b) PMP Aware reviewed 12/01/2020  (c) OxyContin 20 mg p.o. 3 times daily  (d) bowel prophylaxis: MiraLAX as needed  (9) restaging studies:  (a) mostly stable bone lesions with evidence of progression at L3-L5; no epidural tumor by total spinal MRI  in November 2019  (b) no evidence of progressive lung, lymph node or liver lesions by CT scans of the chest abdomen and pelvis  and bone scan December 2019  (c) CT scans abd/pelvis 03/31/2019 shows stable bone metastases, no extraosseous disease  (d) MRI pelvis 04/09/2019 shows bilateral ilium, left acetabulum and lumbar mets, stable  (e) CT abd/pelvis 09/01/2019 shows stable bone mets, slight increase left effusion  (f) CT of the chest 05/05/2020 again shows minimal increase in the left pleural effusion, possible resorption of bone around the left glenoid lesion, otherwise stable  (g) CT of the chest abdomen and pelvis 10/18/2020 again shows no evidence of visceral disease, but there is apparent progression in the bone disease, and some enlargement of the left pleural effusion.  The CA 27-29 has also been rising.  Accordingly her letrozole and palbociclib are being discontinued  (10) palliative radiation 11/18/2018 through 12/02/2018 Site/dose:   1. Lumbar spine; 35 Gy in 14 fractions of 2.5 Gy                      2. Pelvis; 35 Gy in 14 fractions of 2.5 Gy  (11) started cyclophosphamide, methotrexate and fluorouracil [CMF] 11/10/2020  (a) methotrexate held first dose, during palliative radiation  (12) status post left thoracentesis 11/21/2020, with positive cytology  (a) tumor cells are strongly estrogen and progesterone receptor positive, HER-2 negative  (b) foundation 1 requested on the thoracentesis sample shows a PI K3 CA mutation.  Microsatellite status is stable and the tumor mutation burden is low.  There are mutations in ESR 1, GAT A3, and [place] 6  PLAN: Mia [name] unfortunately appears acutely unwell.  Considering her oxygen desaturation, inability to feel the urge to void, and the new onset of tremors and difficulty with balance, I do not feel comfortable ordering additional tests and treating her as an outpatient.  I recommended that she go to the ER for a more thorough work up and possible  admission so that she can have a safe plan of care moving forward to identify the etiology and adequately manage her symptoms.  Mia [name] was in agreement with this.    I called the ER and let charge nurse stacey know about her condition.  I also sent the ER MD Wenda Overland Little a message.    Once we understand her disposition, we will schedule her for follow-up accordingly.    Total encounter time 45 minutes meeting with patient, communicating with ER, reviewing with Dr. [name], discussing the plan with patient.    Wilber Bihari, NP 01/05/21 2:19 PM Medical Oncology and Hematology [facility] [place], [place] [address] Tel. [phone]    Fax. [phone]   *Total Encounter Time as defined by the Centers for Medicare and Medicaid Services includes, in addition to the face-to-face time of a patient visit (documented in the note above) non-face-to-face time: obtaining and reviewing outside history, ordering and reviewing medications, tests or procedures, care coordination (communications with other health care professionals or caregivers) and documentation in the medical record.

## 2021-01-05 NOTE — ED Notes (Signed)
Patient's IV which was placed by U/S IV team is no longer flushing or drawing back blood. Will place another IV team consult in.

## 2021-01-06 ENCOUNTER — Telehealth: Payer: Self-pay | Admitting: Pharmacist

## 2021-01-06 ENCOUNTER — Other Ambulatory Visit (HOSPITAL_COMMUNITY): Payer: Self-pay

## 2021-01-06 ENCOUNTER — Telehealth: Payer: Self-pay

## 2021-01-06 ENCOUNTER — Other Ambulatory Visit: Payer: Self-pay | Admitting: Oncology

## 2021-01-06 ENCOUNTER — Inpatient Hospital Stay (HOSPITAL_COMMUNITY): Payer: MEDICARE

## 2021-01-06 ENCOUNTER — Ambulatory Visit (HOSPITAL_COMMUNITY): Payer: MEDICARE | Attending: Oncology

## 2021-01-06 DIAGNOSIS — J9601 Acute respiratory failure with hypoxia: Secondary | ICD-10-CM

## 2021-01-06 DIAGNOSIS — C50919 Malignant neoplasm of unspecified site of unspecified female breast: Secondary | ICD-10-CM

## 2021-01-06 DIAGNOSIS — Z7189 Other specified counseling: Secondary | ICD-10-CM

## 2021-01-06 DIAGNOSIS — J90 Pleural effusion, not elsewhere classified: Secondary | ICD-10-CM | POA: Diagnosis not present

## 2021-01-06 DIAGNOSIS — N179 Acute kidney failure, unspecified: Secondary | ICD-10-CM | POA: Diagnosis not present

## 2021-01-06 DIAGNOSIS — C7951 Secondary malignant neoplasm of bone: Secondary | ICD-10-CM | POA: Diagnosis not present

## 2021-01-06 LAB — GLUCOSE, PLEURAL OR PERITONEAL FLUID: Glucose, Fluid: 103 mg/dL

## 2021-01-06 LAB — CBC
HCT: 25.8 % — ABNORMAL LOW (ref 36.0–46.0)
Hemoglobin: 7.9 g/dL — ABNORMAL LOW (ref 12.0–15.0)
MCH: 31.1 pg (ref 26.0–34.0)
MCHC: 30.6 g/dL (ref 30.0–36.0)
MCV: 101.6 fL — ABNORMAL HIGH (ref 80.0–100.0)
Platelets: 456 10*3/uL — ABNORMAL HIGH (ref 150–400)
RBC: 2.54 MIL/uL — ABNORMAL LOW (ref 3.87–5.11)
RDW: 18.2 % — ABNORMAL HIGH (ref 11.5–15.5)
WBC: 1.4 10*3/uL — CL (ref 4.0–10.5)
nRBC: 0 % (ref 0.0–0.2)

## 2021-01-06 LAB — AMYLASE, PLEURAL OR PERITONEAL FLUID: Amylase, Fluid: 39 U/L

## 2021-01-06 LAB — PROCALCITONIN: Procalcitonin: <0.1 ng/mL

## 2021-01-06 LAB — BODY FLUID CELL COUNT WITH DIFFERENTIAL
Eos, Fluid: 3 %
Lymphs, Fluid: 37 %
Monocyte-Macrophage-Serous Fluid: 59 % (ref 50–90)
Neutrophil Count, Fluid: 1 % (ref 0–25)
Total Nucleated Cell Count, Fluid: 93 cu mm (ref 0–1000)

## 2021-01-06 LAB — BASIC METABOLIC PANEL
Anion gap: 9 (ref 5–15)
BUN: 31 mg/dL — ABNORMAL HIGH (ref 8–23)
CO2: 26 mmol/L (ref 22–32)
Calcium: 8.9 mg/dL (ref 8.9–10.3)
Chloride: 94 mmol/L — ABNORMAL LOW (ref 98–111)
Creatinine, Ser: 1.72 mg/dL — ABNORMAL HIGH (ref 0.44–1.00)
GFR, Estimated: 33 mL/min — ABNORMAL LOW (ref >60)
Glucose, Bld: 117 mg/dL — ABNORMAL HIGH (ref 70–99)
Potassium: 4.8 mmol/L (ref 3.5–5.1)
Sodium: 129 mmol/L — ABNORMAL LOW (ref 135–145)

## 2021-01-06 LAB — GLUCOSE, CAPILLARY
Glucose-Capillary: 108 mg/dL — ABNORMAL HIGH (ref 70–99)
Glucose-Capillary: 135 mg/dL — ABNORMAL HIGH (ref 70–99)

## 2021-01-06 LAB — LACTATE DEHYDROGENASE, PLEURAL OR PERITONEAL FLUID: LD, Fluid: 191 U/L — ABNORMAL HIGH (ref 3–23)

## 2021-01-06 LAB — HEMOGLOBIN A1C
Hgb A1c MFr Bld: 6.3 % — ABNORMAL HIGH (ref 4.8–5.6)
Mean Plasma Glucose: 134.11 mg/dL

## 2021-01-06 LAB — PROTEIN, PLEURAL OR PERITONEAL FLUID: Total protein, fluid: 3.6 g/dL

## 2021-01-06 LAB — ALBUMIN, PLEURAL OR PERITONEAL FLUID: Albumin, Fluid: 2 g/dL

## 2021-01-06 LAB — MRSA PCR SCREENING: MRSA by PCR: NEGATIVE

## 2021-01-06 LAB — LACTIC ACID, PLASMA: Lactic Acid, Venous: 1.5 mmol/L (ref 0.5–1.9)

## 2021-01-06 MED ORDER — ORAL CARE MOUTH RINSE
15.0000 mL | Freq: Two times a day (BID) | OROMUCOSAL | Status: DC
  Administered 2021-01-06 – 2021-01-14 (×13): 15 mL via OROMUCOSAL

## 2021-01-06 MED ORDER — OXYCODONE HCL ER 20 MG PO T12A
20.0000 mg | EXTENDED_RELEASE_TABLET | Freq: Two times a day (BID) | ORAL | Status: DC
  Administered 2021-01-06 – 2021-01-11 (×12): 20 mg via ORAL
  Filled 2021-01-06 (×13): qty 1

## 2021-01-06 MED ORDER — SODIUM CHLORIDE 0.9 % IV SOLN
2.0000 g | Freq: Two times a day (BID) | INTRAVENOUS | Status: AC
  Administered 2021-01-06 – 2021-01-08 (×5): 2 g via INTRAVENOUS
  Filled 2021-01-06: qty 2
  Filled 2021-01-06: qty 1.67
  Filled 2021-01-06 (×3): qty 2

## 2021-01-06 MED ORDER — ALPELISIB (300 MG DAILY DOSE) 2 X 150 MG PO TBPK
300.0000 mg | ORAL_TABLET | Freq: Every day | ORAL | 6 refills | Status: DC
  Filled 2021-01-06: qty 60, fill #0

## 2021-01-06 MED ORDER — ALPELISIB (300 MG DAILY DOSE) 2 X 150 MG PO TBPK: 300.0000 mg | ORAL_TABLET | Freq: Every day | ORAL | 6 refills | Status: DC

## 2021-01-06 MED ORDER — LIDOCAINE HCL 1 % IJ SOLN
INTRAMUSCULAR | Status: AC
  Filled 2021-01-06: qty 20

## 2021-01-06 NOTE — Consult Note (Signed)
Orthopaedic Trauma Service (OTS) Consult   Patient ID: Mia [name] MRN: [id number] DOB/AGE: 03-08-1956 65 y.o.  Reason for Consult:Right pathologic proximal humerus fracture Referring Physician: Dr. Flora Lipps, MD [facility]  HPI: Mia [name] is an 65 y.o. female who is being seen in consultation at the request of Dr. Louanne Belton for evaluation of right pathologic proximal humerus fracture.  This is a 65 year old female with history of metastatic breast cancer that was incidentally found to have a pathologic proximal humerus fracture on radiographs.  The patient is right-hand dominant.  She was recently admitted for shortness of breath due to pulmonary effusions.  She had a thoracentesis today to drain almost a liter off of her lungs.  Her husband is at bedside.  Patient was seen on the [facility] long oncology floor.  Patient states that she started developed pain in her right shoulder in January.  She had no traumatic incident.  At the end of January it had progressively gotten worse.  CT scan in January 18 shows pathologic changes in her proximal humerus.  Patient states that she underwent radiation therapy to her right shoulder in February which did not significantly assist with the pain that she was having.  Currently she is not able to do anything with that arm or lift her arm up without severe pain.  She states that she cannot perform activities of daily living.  She cannot raise her shoulder at all secondary to the pain and discomfort.  Past Medical History:  Diagnosis Date  . Breast cancer metastasized to bone ([place])   . Diabetes mellitus without complication ([place])   . History of radiation therapy 11/18/2018-12/02/2018   lumbar spine and pelvis   Dr [name]  . History of radiation therapy 11/08/2020-11/18/2020   right arm   Dr [name]  . Hyperlipidemia   . Hypertension   . Lymphedema of right arm   . Neuropathy associated with cancer ([place])   . Obesity (BMI  35.0-39.9 without comorbidity)     Past Surgical History:  Procedure Laterality Date  . CATARACT EXTRACTION Left   . HYSTERECTOMY ABDOMINAL WITH SALPINGO-OOPHORECTOMY    . MODIFIED RADICAL MASTECTOMY Right     Family History  Problem Relation Age of Onset  . Diabetes Mother   . Breast cancer Mother 19  . Cerebral aneurysm Mother   . Pulmonary embolism Father   . Colon cancer Father 69  . Breast cancer Sister 75       noninvasive  . Breast cancer Paternal Grandmother 63    Social History:  reports that she has never smoked. She has never used smokeless tobacco. She reports previous alcohol use. She reports that she does not use drugs.  Allergies:  Allergies  Allergen Reactions  . Morphine And Related Anaphylaxis    Medications:  No current facility-administered medications on file prior to encounter.   Current Outpatient Medications on File Prior to Encounter  Medication Sig Dispense Refill  . amLODipine (NORVASC) 10 MG tablet Take 10 mg by mouth daily.    Marland Kitchen aspirin 81 MG chewable tablet Chew 81 mg by mouth daily.    . fenofibrate (TRICOR) 145 MG tablet TAKE 1 TABLET BY MOUTH EVERY DAY (Patient taking differently: Take 145 mg by mouth daily.) 90 tablet 2  . fluticasone (FLONASE) 50 MCG/ACT nasal spray SPRAY 2 SPRAYS INTO EACH NOSTRIL EVERY DAY (Patient taking differently: Place 1 spray into both nostrils daily.) 48 mL 1  . gabapentin (NEURONTIN) 300 MG capsule Take 1  capsule (300 mg total) by mouth 4 (four) times daily. Pt is only taking 2 capsules a day (Patient taking differently: Take 300 mg by mouth 2 (two) times daily.) 180 capsule 0  . ibuprofen (ADVIL) 800 MG tablet Take 800 mg by mouth every 8 (eight) hours as needed for moderate pain. 30 tablet 0  . insulin glargine (LANTUS SOLOSTAR) 100 UNIT/ML Solostar Pen INJECT 24 UNITS INTO THE SKIN DAILY (Patient taking differently: Inject 24 Units into the skin daily.) 15 mL 1  . losartan (COZAAR) 50 MG tablet Take 1 tablet  (50 mg total) by mouth 2 (two) times daily. 180 tablet 3  . metoprolol succinate (TOPROL-XL) 25 MG 24 hr tablet TAKE 1 TABLET BY MOUTH EVERY DAY (Patient taking differently: Take 25 mg by mouth daily.) 90 tablet 1  . oxyCODONE (OXY IR/ROXICODONE) 5 MG immediate release tablet Take 1-2 tablets (5-10 mg total) by mouth every 4 (four) hours as needed for severe pain. 60 tablet 0  . oxyCODONE (OXYCONTIN) 20 mg 12 hr tablet Take 1 tablet (20 mg total) by mouth every 8 (eight) hours. 90 tablet 0  . pantoprazole (PROTONIX) 40 MG tablet TAKE 1 TABLET BY MOUTH EVERY DAY (Patient taking differently: Take 40 mg by mouth daily.) 90 tablet 0  . pioglitazone (ACTOS) 15 MG tablet TAKE 1 TABLET BY MOUTH EVERY DAY (Patient taking differently: Take 15 mg by mouth daily.) 90 tablet 1  . pravastatin (PRAVACHOL) 20 MG tablet TAKE 1 TABLET BY MOUTH EVERY DAY (Patient taking differently: Take 20 mg by mouth daily.) 90 tablet 2  . sitaGLIPtin-metformin (JANUMET) 50-1000 MG tablet Take 1 tablet by mouth daily with breakfast. 90 tablet 1  . Wheat Dextrin-Calcium (BENEFIBER PLUS CALCIUM) CHEW Chew 2 tablets by mouth daily.    Marland Kitchen COVID-19 mRNA vaccine, Pfizer, 30 MCG/0.3ML injection AS DIRECTED .3 mL 0  . Insulin Admin Supplies MISC Inject 24 Units into the skin daily.    . Insulin Pen Needle 31G X 5 MM MISC UAD for Sq inj for DM qd 100 each PRN  . [DISCONTINUED] prochlorperazine (COMPAZINE) 10 MG tablet Take 1 tablet (10 mg total) by mouth every 6 (six) hours as needed (Nausea or vomiting). (Patient not taking: No sig reported) 30 tablet 1    ROS: Constitutional: No fever or chills Vision: No changes in vision ENT: No difficulty swallowing CV: No chest pain Pulm: +SOB GI: No nausea or vomiting GU: No urgency or inability to hold urine Skin: No poor wound healing Neurologic: No numbness or tingling Psychiatric: WNL Heme: No bruising Allergic: No reaction to medications or food   Exam: Blood pressure (!) 109/56,  pulse (!) 105, temperature 98.5 F (36.9 C), temperature source Oral, resp. rate 18, height 5\' 4"  (1.626 m), weight 99.3 kg, last menstrual period 10/01/2006, SpO2 99 %. General: No acute distress Orientation: Awake alert and oriented x3 Mood and Affect: Cooperative and pleasant Gait: Did not assess on exam today Coordination and balance: Within normal limits   Right upper extremity: Arm is held to the side.  Some lymphedema in the upper extremity.  She has motor and sensory function to median, radial and ulnar nerve distribution.  She is able to bend and extend her elbow however she is unable to elevate her shoulder or raise her arm.  She has severe pain when she passively moves her shoulder or arm.  She has a warm well-perfused hand.  Left upper extremity skin without lesions. No tenderness to palpation. Full painless  ROM, full strength in each muscle groups without evidence of instability.   Medical Decision Making: Data: Imaging: X-rays of the right humerus show what appears to be a pathologic right proximal humerus fracture with mild angulation and displacement.  I also reviewed the CT scan of her chest that was performed in January of this year.  She has mottled appearance with lytic lesions to her proximal humerus.  No acute fracture was noted on that scan however was not dedicated of the shoulder.  Labs:  Results for orders placed or performed during the hospital encounter of 01/05/21 (from the past 24 hour(s))  Urinalysis, Routine w reflex microscopic Urine, Clean Catch     Status: None   Collection Time: 01/05/21 10:00 PM  Result Value Ref Range   Color, Urine YELLOW YELLOW   APPearance CLEAR CLEAR   Specific Gravity, Urine 1.009 1.005 - 1.030   pH 5.0 5.0 - 8.0   Glucose, UA NEGATIVE NEGATIVE mg/dL   Hgb urine dipstick NEGATIVE NEGATIVE   Bilirubin Urine NEGATIVE NEGATIVE   Ketones, ur NEGATIVE NEGATIVE mg/dL   Protein, ur NEGATIVE NEGATIVE mg/dL   Nitrite NEGATIVE NEGATIVE    Leukocytes,Ua NEGATIVE NEGATIVE  Procalcitonin     Status: None   Collection Time: 01/06/21  1:34 AM  Result Value Ref Range   Procalcitonin <0.10 ng/mL  Basic metabolic panel     Status: Abnormal   Collection Time: 01/06/21  1:34 AM  Result Value Ref Range   Sodium 129 (L) 135 - 145 mmol/L   Potassium 4.8 3.5 - 5.1 mmol/L   Chloride 94 (L) 98 - 111 mmol/L   CO2 26 22 - 32 mmol/L   Glucose, Bld 117 (H) 70 - 99 mg/dL   BUN 31 (H) 8 - 23 mg/dL   Creatinine, Ser 1.72 (H) 0.44 - 1.00 mg/dL   Calcium 8.9 8.9 - 10.3 mg/dL   GFR, Estimated 33 (L) >60 mL/min   Anion gap 9 5 - 15  CBC     Status: Abnormal   Collection Time: 01/06/21  1:34 AM  Result Value Ref Range   WBC 1.4 (LL) 4.0 - 10.5 K/uL   RBC 2.54 (L) 3.87 - 5.11 MIL/uL   Hemoglobin 7.9 (L) 12.0 - 15.0 g/dL   HCT 25.8 (L) 36.0 - 46.0 %   MCV 101.6 (H) 80.0 - 100.0 fL   MCH 31.1 26.0 - 34.0 pg   MCHC 30.6 30.0 - 36.0 g/dL   RDW 18.2 (H) 11.5 - 15.5 %   Platelets 456 (H) 150 - 400 K/uL   nRBC 0.0 0.0 - 0.2 %  Hemoglobin A1c     Status: Abnormal   Collection Time: 01/06/21  1:34 AM  Result Value Ref Range   Hgb A1c MFr Bld 6.3 (H) 4.8 - 5.6 %   Mean Plasma Glucose 134.11 mg/dL  MRSA PCR Screening     Status: None   Collection Time: 01/06/21  6:22 AM   Specimen: Nasal Mucosa; Nasopharyngeal  Result Value Ref Range   MRSA by PCR NEGATIVE NEGATIVE  Lactate dehydrogenase (pleural or peritoneal fluid)     Status: Abnormal   Collection Time: 01/06/21  1:03 PM  Result Value Ref Range   LD, Fluid 191 (H) 3 - 23 U/L   Fluid Type-FLDH CYTO PLEU   Amylase, pleural or peritoneal fluid     Status: None   Collection Time: 01/06/21  1:03 PM  Result Value Ref Range   Amylase, Fluid 39 U/L  Fluid Type-FAMY CYTO PLEU   Albumin, pleural or peritoneal fluid     Status: None   Collection Time: 01/06/21  1:03 PM  Result Value Ref Range   Albumin, Fluid 2.0 g/dL   Fluid Type-FALB CYTO PLEU   Protein, pleural or peritoneal fluid      Status: None   Collection Time: 01/06/21  1:03 PM  Result Value Ref Range   Total protein, fluid 3.6 g/dL   Fluid Type-FTP CYTO PLEU   Glucose, pleural or peritoneal fluid     Status: None   Collection Time: 01/06/21  1:03 PM  Result Value Ref Range   Glucose, Fluid 103 mg/dL   Fluid Type-FGLU CYTO PLEU   Glucose, capillary     Status: Abnormal   Collection Time: 01/06/21  4:37 PM  Result Value Ref Range   Glucose-Capillary 108 (H) 70 - 99 mg/dL  Glucose, capillary     Status: Abnormal   Collection Time: 01/06/21  7:35 PM  Result Value Ref Range   Glucose-Capillary 135 (H) 70 - 99 mg/dL    Imaging or Labs ordered: None  Medical history and chart was reviewed and case discussed with medical provider.  Assessment/Plan: 65 year old female right-hand-dominant with a history of metastatic breast cancer with pathologic proximal humerus fracture.  The patient is severely debilitated by this fracture.  Due to the quality of life issues by continued nonoperative treatment I would potentially lean towards operative management.  I discussed with her and her husband potential prophylactic nailing of the proximal humerus versus possible reverse total shoulder arthroplasty.  I discussed with her that she is a high risk candidate due to previous radiation therapy in her shoulder as well as the other medical comorbidities that she is on.  However in light of her continued debilitated state of her shoulder it may be worth the risk of pursuing.  I have reached out to my colleague Dr. Griffin Basil to review her imaging and determine whether he feels anything would be warranted.  After his review we will reevaluate and discuss further with the patient and her husband.  Shona Needles, MD [facility] [phone] (office) [email or web address]

## 2021-01-06 NOTE — Progress Notes (Signed)
Spoke with Steffanie Dunn, RN to clarify no labs were drawn with PIV start by this RN. Pt with limited vascular access options.

## 2021-01-06 NOTE — Progress Notes (Signed)
[place]  [phone] [phone]    ID: [name] DOB: 02-06-1956  MR#: [id number]  VPX#:[id number]  Patient Care Team: Ronnald Nian, DO as PCP - General (Family Medicine) Yael Angerer, Virgie Dad, MD as Consulting Physician (Oncology) [name], [name] PA   CHIEF COMPLAINT: Estrogen receptor positive stage IV breast cancer (s/p right mastectomy)  CURRENT TREATMENT: CMF chemotherapy;    INTERVAL HISTORY: Mia [name] returns today for follow-up and treatment of her estrogen receptor positive stage IV breast cancer.   She started CMF on 11/11/2020 and tolerated this well.  She has had no acute side effects from this that she has to report.  Today is cycle 3 day 1.   REVIEW OF SYSTEMS: Mia [name] continues to have significant pain in the right shoulder area.  She wonders if there is something going on there that could be addressed as the radiation did not help very much.  Overall the pain is moderately well controlled on her current medications and she has an adequate bowel prophylaxis regimen.  A detailed review of systems today was otherwise stable   COVID 19 VACCINATION STATUS: [name] x2, with the booster 08/16/2020   HISTORY OF CURRENT ILLNESS: From the original intake note:  We have reviewed the medical records from [place], which is the source of the information below:  Mia [name] was initially diagnosed with Stage IIIA (T2N2M0) invasive ductal carcinoma, estrogen receptor positive and HER2 negative right breast cancer in 2000. She underwent right mastectomy, with 4 positive metastatic lymph nodes. She had chemotherapy with taxol and cytoxan and fulvestrant in the past. She had radiation and completed 5 years of tamoxifen.   She was diagnosed with Stage IV disease 04/18/2005 with metastases to the bone, lung, and additional nodes. She was on capecitabine but was discontinued after rising CA 27-29 and  scans.   She started anastrozole and everolimus with Delton See in April 2014. She tolerated this well, but this was discontinued in July 2014 due to abnormal blood tests, hyperlipidemia and hyperglycemia.  She began Abraxane 17m /m2 and Xgeva in August 2014 weekly x3 with 1 week off. She tolerated this treatment well. She discontinued Xgeva January 2016 due to osteonecrosis of the jaw and mouth issues. She continued abraxane alone until her last dose on 11/18/2015 due to neuropathy and disease progression with restaging studies on 11/23/2015 with a CT chest abdomen and pelvis and one scan showing skull, sternal and femoral lesions.   She was switched to gemcitabine starting 01/20/2016 weekly x3 and 1 week off. This was discontinued on 06/15/2016 due to a port related jugular clot on the right side. She was given Lovenox for 3 months.  She was switched to Letrozole 2.5 mg and palbociclib 125 mg starting 07/13/2016. She tolerated this well. Restaging studies with CT chest abdomen and pelvis and bone scan on 07/01/2017 showed stable/ improving lesions. CA 27-29 was stable.  During the last few months of follow up in [place] she completed restaging [facility] showing: A 6 mm pulmonary nodule in the left upper lobe laterally. Progression of metastatic disease to the bones at the L4 and L5 vertebral bodies. Sclerotic metastases at T8, T10, an T11, are similar to previous exams. Sclerotic metastases in the sternum stable. Hepatic steatosis. Aortic atherosclerosis.  Most recent CCA-67-29(January 2019) was 58.  Most recent hemoglobin A1c was 7.1 according to the patient  The patient's  subsequent history is as detailed below.   PAST MEDICAL HISTORY: Past Medical History:  Diagnosis Date  . Breast cancer metastasized to bone ([place])   . Diabetes mellitus without complication ([place])   . History of radiation therapy 11/18/2018-12/02/2018   lumbar spine and  pelvis   Dr [name]  . History of radiation therapy 11/08/2020-11/18/2020   right arm   Dr [name]  . Hyperlipidemia   . Hypertension   . Lymphedema of right arm   . Neuropathy associated with cancer ([facility])   . Obesity (BMI 35.0-39.9 without comorbidity)      PAST SURGICAL HISTORY: Past Surgical History:  Procedure Laterality Date  . CATARACT EXTRACTION Left   . HYSTERECTOMY ABDOMINAL WITH SALPINGO-OOPHORECTOMY    . MODIFIED RADICAL MASTECTOMY Right      FAMILY HISTORY: Family History  Problem Relation Age of Onset  . Diabetes Mother   . Breast cancer Mother 63  . Cerebral aneurysm Mother   . Pulmonary embolism Father   . Colon cancer Father 95  . Breast cancer Sister 61       noninvasive  . Breast cancer Paternal Grandmother 78   The patient reports she had negative genetic testing at [facility] 2014, report not available. --The patient's father died at age 31 due to a PE, and he also had a history of colon cancer diagnosed at age 5. The patient's mother died at age 86 due to diabetes and survived a cerebral aneurysm. The patient's mother also had a history of breast cancer diagnosed at age 95.  The patient has no brothers and 1 sister. The patient's sister was diagnosed with non invasive breast cancer at age 53. There was a paternal grandmother diagnosed with breast cancer at age 81. The patient denies a family history of ovarian cancer.    GYNECOLOGIC HISTORY:  Patient's last menstrual period was 10/01/2006. Menarche: 65 years old Age at first live birth: 65 years old She is [place] P2.  She is status post hysterectomy with bilateral salpingo- oophorectomy 12/23/2006 with benign pathology ([id number]) She never used HRT.    SOCIAL HISTORY:  Mia [name] is disabled due to her breast cancer. She used to be Surveyor, quantity for a cardiology office. Her husband, [name] is in the process of retiring. He is a Electrical engineer for a power company. The patient's  daughter, Mia [name], lives in [place] and works as a Teaching laboratory technician. The patient's second daughter, Mia [name] is a stay at home mom and is a special needs teacher, who will soon move to [facility] [place] The patient plans on attending [employer].     ADVANCED DIRECTIVES: In the absence of any documentation to the contrary, the patient's spouse is their HCPOA.    HEALTH MAINTENANCE: Social History   Tobacco Use  . Smoking status: Never Smoker  . Smokeless tobacco: Never Used  Vaping Use  . Vaping Use: Never used  Substance Use Topics  . Alcohol use: Not Currently  . Drug use: Never    Colonoscopy: 2017   PAP:  Bone density: never  Mammogram: 2017   Allergies  Allergen Reactions  . Morphine And Related Anaphylaxis    Current Facility-Administered Medications  Medication Dose Route Frequency Provider Last Rate Last Admin  . aspirin chewable tablet 81 mg  81 mg Oral Daily Tu, Ching T, DO      . ceFEPIme (MAXIPIME) 2 g in sodium chloride 0.9 % 100 mL IVPB  2 g Intravenous Q12H  Pokhrel, Laxman, MD      . fenofibrate tablet 160 mg  160 mg Oral Daily Tu, Ching T, DO      . fluticasone (FLONASE) 50 MCG/ACT nasal spray 1 spray  1 spray Each Nare Daily Tu, Ching T, DO      . gabapentin (NEURONTIN) capsule 300 mg  300 mg Oral BID Tu, Ching T, DO   300 mg at 01/06/21 0128  . insulin aspart (novoLOG) injection 0-15 Units  0-15 Units Subcutaneous TID WC Tu, Ching T, DO      . insulin aspart (novoLOG) injection 0-5 Units  0-5 Units Subcutaneous QHS Tu, Ching T, DO      . MEDLINE mouth rinse  15 mL Mouth Rinse BID Tu, Ching T, DO      . oxyCODONE (Oxy IR/ROXICODONE) immediate release tablet 5-10 mg  5-10 mg Oral Q4H PRN Tu, Ching T, DO      . oxyCODONE (OXYCONTIN) 12 hr tablet 20 mg  20 mg Oral Q12H Noelle Sease, Virgie Dad, MD      . pantoprazole (PROTONIX) EC tablet 40 mg  40 mg Oral Daily Tu, Ching T, DO      . pravastatin (PRAVACHOL) tablet 20 mg  20 mg Oral Daily  Tu, Ching T, DO        OBJECTIVE: White woman using a cane Vitals:   01/06/21 0400 01/06/21 0739  BP: (!) 103/53 104/79  [name]: 84 84  Resp:  15  Temp: 97.7 F (36.5 C) 98.4 F (36.9 C)  SpO2: 100% 99%   Wt Readings from Last 3 Encounters:  01/05/21 219 lb (99.3 kg)  01/05/21 219 lb (99.3 kg)  12/01/20 215 lb 6.4 oz (97.7 kg)   Body mass index is 37.59 kg/m.   ECOG FS:2 - Symptomatic, <50% confined to bed  Sclerae unicteric, EOMs intact Wearing a mask No cervical or supraclavicular adenopathy Lungs no rales or rhonchi Heart regular rate and rhythm Abd soft, nontender, positive bowel sounds MSK no focal spinal tenderness, no upper extremity lymphedema Neuro: nonfocal, well oriented, appropriate affect Breasts: Deferred   12/01/2020 right axilla    LAB RESULTS:  CMP   No results found for: TOTALPROTELP, ALBUMINELP, A1GS, A2GS, BETS, BETA2SER, GAMS, MSPIKE, SPEI  No results found for: KPAFRELGTCHN, LAMBDASER, KAPLAMBRATIO  Lab Results  Component Value Date   WBC 1.4 (LL) 01/06/2021   NEUTROABS 0.3 (LL) 01/05/2021   HGB 7.9 (L) 01/06/2021   HCT 25.8 (L) 01/06/2021   MCV 101.6 (H) 01/06/2021   PLT 456 (H) 01/06/2021    No results found for: LABCA2  No components found for: [id number]  No results for input(s): INR in the last 168 hours.  No results found for: LABCA2  No results found for: NWG956  No results found for: OZH086  No results found for: VHQ469  Lab Results  Component Value Date   CA2729 193.1 (H) 12/22/2020    No components found for: HGQUANT  No results found for: CEA1 / No results found for: CEA1   No results found for: AFPTUMOR  No results found for: CHROMOGRNA  No results found for: HGBA, HGBA2QUANT, HGBFQUANT, HGBSQUAN (Hemoglobinopathy evaluation)   No results found for: LDH  No results found for: IRON, TIBC, IRONPCTSAT (Iron and TIBC)  No results found for: FERRITIN  Urinalysis    Component Value Date/Time    COLORURINE YELLOW 01/05/2021 2200   APPEARANCEUR CLEAR 01/05/2021 2200   LABSPEC 1.009 01/05/2021 2200   PHURINE 5.0 01/05/2021 2200  GLUCOSEU NEGATIVE 01/05/2021 2200   HGBUR NEGATIVE 01/05/2021 2200   BILIRUBINUR NEGATIVE 01/05/2021 2200   KETONESUR NEGATIVE 01/05/2021 2200   PROTEINUR NEGATIVE 01/05/2021 2200   NITRITE NEGATIVE 01/05/2021 2200   LEUKOCYTESUR NEGATIVE 01/05/2021 2200     STUDIES: Refuses mammography at this point (12/23/2019)   ELIGIBLE FOR AVAILABLE RESEARCH PROTOCOL: no   ASSESSMENT: 65 y.o. [place], [place] woman with stage IV breast cancer, as follows:  (1) status post right mastectomy in 2000, for a pT2 pN2, stage IIIA invasive ductal carcinoma, estrogen receptor positive, progesterone receptor not tested, HER-2 negative (0) by immunohistochemistry  (a) adjuvant chemotherapy with doxorubicin and cyclophosphamide in dose dense fashion x4 followed by paclitaxel in dose dense fashion x4  (b) adjuvant radiation: 30 doses  (c) antiestrogens: Tamoxifen for 5 years, completed 2005  (2) METASTASTIC DISEASE: 2008, involving bones, lungs, and lymph nodes  (a) CA 27-29 is informative  (b) CT of the chest abdomen and pelvis in [facility] finds stable sclerotic metastases (T8, T10, T11, sternum, L4, L5); 0.6 cm left upper lobe lung nodule stable  (3)  prior anti-estrogen treatments:  (a) fulvestrant--progression  (b) exemestane/everolimus--hyperlipidemia, hyperglycemia  (4) prior chemotherapy treatments:  (a) capecitabine: progression  (b) Abraxane, August 2014 through 11/18/2015: good response but stopped due to neuropathy  (c) gemcitabine 01/20/2016--?; multiple interruptions secondary to infections  (5) radiation therapy:  (a) T spine and Right femur, completed 01/10/2016  (6) letrozole/ palbociclib started 07/13/2016, discontinued 10/26/2020 with evidence of progression  (7) bone treatment:  (a) denosumab/Xgeva--discontinued January 2016 due to  osteonecrosis of the jaw  (8) cancer associated pain: Updated 12/01/2020 (a) OxyIR 5 mg QID PRN  (b) PMP Aware reviewed 12/01/2020  (c) OxyContin 20 mg p.o. 3 times daily  (d) bowel prophylaxis: MiraLAX as needed  (9) restaging studies:  (a) mostly stable bone lesions with evidence of progression at L3-L5; no epidural tumor by total spinal MRI in November 2019  (b) no evidence of progressive lung, lymph node or liver lesions by CT scans of the chest abdomen and pelvis and bone scan December 2019  (c) CT scans abd/pelvis 03/31/2019 shows stable bone metastases, no extraosseous disease  (d) MRI pelvis 04/09/2019 shows bilateral ilium, left acetabulum and lumbar mets, stable  (e) CT abd/pelvis 09/01/2019 shows stable bone mets, slight increase left effusion  (f) CT of the chest 05/05/2020 again shows minimal increase in the left pleural effusion, possible resorption of bone around the left glenoid lesion, otherwise stable  (g) CT of the chest abdomen and pelvis 10/18/2020 again shows no evidence of visceral disease, but there is apparent progression in the bone disease, and some enlargement of the left pleural effusion.  The CA 27-29 has also been rising.  Accordingly her letrozole and palbociclib are being discontinued  (10) palliative radiation 11/18/2018 through 12/02/2018 Site/dose:   1. Lumbar spine; 35 Gy in 14 fractions of 2.5 Gy                      2. Pelvis; 35 Gy in 14 fractions of 2.5 Gy  (11) started cyclophosphamide, methotrexate and fluorouracil [CMF] 11/10/2020  (a) methotrexate held first dose, during palliative radiation  (12) status post left thoracentesis 11/21/2020, with positive cytology  (a) tumor cells are strongly estrogen and progesterone receptor positive, HER-2 negative  (b) foundation 1 requested on the thoracentesis sample shows a PI K3 CA mutation.  Microsatellite status is stable and the tumor mutation burden is low.  There  are mutations in ESR 1, GAT A3, and Garden  6  PLAN: Mia [name] continues to have significant pain.  We discussed the fact that the goal of pain treatment is to make her more functional and if she is not functional on the current doses of what she is getting to then she may need more.  Currently the place where the pain is least control is the right shoulder.  We are going to obtain an MRI of that area just to see if we are missing something that could be fixed.  She does have also significant lymphedema there.  That seems to be worsening.  She might benefit from a compression machine and I will refer her to physical therapy with all those considerations in mind.  Since we have a PI 3K mutation in her tumor she would be a good candidate for alpelisib.  Very likely after 4 cycles of CMF unless we have a significant response we will go to fulvestrant and alpelisib.  I have set her up for a CT of the chest to be done after her fourth CMF dose.    She knows to call for any other issue that may develop before the next visit.  Total encounter time 40 minutes.Sarajane Jews C. Lucien Budney, MD 01/06/21 9:25 AM Medical Oncology and Hematology [facility] [place], [place] [address] Tel. [phone]    Fax. [phone]   I, Wilburn Mylar, am acting as scribe for Dr. Virgie Dad. Caraline Deutschman.  I, Lurline Del MD, have reviewed the above documentation for accuracy and completeness, and I agree with the above.   *Total Encounter Time as defined by the Centers for Medicare and Medicaid Services includes, in addition to the face-to-face time of a patient visit (documented in the note above) non-face-to-face time: obtaining and reviewing outside history, ordering and reviewing medications, tests or procedures, care coordination (communications with other health care professionals or caregivers) and documentation in the medical record.

## 2021-01-06 NOTE — Procedures (Signed)
Ultrasound-guided diagnostic and therapeutic left thoracentesis performed yielding 1.2 liters of blood-tinged fluid. No immediate complications. Follow-up chest x-ray pending.The fluid was sent to the lab for preordered studies. Due to pt chest discomfort only the above amount of fluid was removed today. EBL< 1 cc.

## 2021-01-06 NOTE — Telephone Encounter (Signed)
Oral Oncology Patient Advocate Encounter  Received notification from [employer] that prior authorization for Mia [name] is required.  PA submitted on CoverMyMeds Key [id number] Status is pending  Oral Oncology Clinic will continue to follow.  [place] Patient [place] Phone [phone] Fax [phone] 01/06/2021 10:19 AM

## 2021-01-06 NOTE — Progress Notes (Signed)
Orthopedic Tech Progress Note Patient Details:  Mia [name] 1956/08/14 [id number]  Patient ID: Mia [name], female   DOB: April 08, 1956, 65 y.o.   MRN: [id number]   [name] 01/06/2021, 6:47 PM Shoulder immobilizer applied per verbal from Dr Verlon Setting.

## 2021-01-06 NOTE — Telephone Encounter (Signed)
Oral Oncology Pharmacist Encounter  Received new prescription for Piqray (alpelisib) for the treatment of ER/PR positive, HER-2 negative, PIK3CA-mutated metastatic breast cancer in conjunction with fulvestrant, planned duration until disease progression or unacceptable drug toxicity.  Prescription dose and frequency assessed for appropriateness.   CBC, BMP and A1c from 01/06/21 assessed, pt Scr of 1.72 mg/dL (CrCl ~33 mL/min) no renal dose adjustments required. Reviewed LFTs from CMP on 01/05/21 and these were WNL. Patient with WBC of 1.4 K/uL and Hgb down to 7.9 g/dL - Piqray will not be initiated until after patient sees MD on 01/26/21.    Of note - patient has history of T2DM and currently on insulin glargine, pioglitazone, and Janumet at home. A1c of 6.3% on 01/06/21 and FPG of 117 mg/dL. Blood sugars will need to be closely monitored after initiation of Piqray due to risk of hyperglycemia with medication, and there may be need for dose adjustment of current antihyperglycemic regimen.   Current medication list in Epic reviewed, no relevant/significant DDIs with Piqray identified.  Evaluated chart and no patient barriers to medication adherence noted.   Patient's insurance requires that [place] to be filled through [facility] - prescription redirected for dispensing.   Oral Oncology Clinic will continue to follow for insurance authorization, copayment issues, initial counseling and start date.  Leron Croak, PharmD, BCPS Hematology/Oncology Clinical Pharmacist [facility] [phone] 01/06/2021 10:40 AM

## 2021-01-06 NOTE — Progress Notes (Addendum)
PROGRESS NOTE  Mia [name] KPT:[id number] DOB: 1956/06/23 DOA: 01/05/2021 PCP: Ronnald Nian, DO   LOS: 1 day   Brief narrative:   Mia [name] is a 65 y.o. female with medical history significant for stage IV metastatic breast cancer to lung and bones s/p right mastectomy, radiation on chemotherapy, history of malignant pleural effusion requiring thoracentesis, essential hypertension, insulin-dependent type 2 diabetes presented to hospital with difficulty urinating, shortness of breath and worsening tremors.  Patient does have chronic shortness of breath and tremors but had recently been worsening.   She had thoracentesis on 11/21/2020 with 1.5 L malignant effusion removed. She also notes chronic right-sided shoulder pain that started in January 2022 and has progressively gotten to the point where she can no longer lift her arm.  She was supposed to get an MRI to follow-up yesterday.   In the ED, patient was afebrile, borderline hypotensive, and had oxygen desaturation down to 78% requiring 2 L of O2.  Labs notable for significant neutropenia with WBC of 1, absolute neutrophils 0.3.  Hemoglobin 10.4, HCT of 33.7, Na of 128, Creatinine of 2.03 from 1.24.  Chest x-ray showed complete approximation of the left hemothorax. Post-void residual was zero and pt ultimately urinated in ED.  Patient was then admitted to hospital for further evaluation and treatment.  Assessment/Plan:  Principal Problem:   Acute respiratory failure with hypoxia (HCC) Active Problems:   Metastatic breast cancer ([place])   Bone metastases (HCC)   Lung metastases ([place])   Peripheral neuropathy due to chemotherapy ([place])   Diabetes mellitus type 2 in obese (HCC)   Pleural effusion   Hypotension   AKI (acute kidney injury) ([place])   Hyponatremia   Neutropenia (HCC)   Shoulder pain, right  Acute hypoxic respiratory failure secondary to left pleural effusion Chest x-ray in the ED showed large left pleural  effusion.  Patient was unable to get CT scan due to worsening renal insufficiency.  Patient did have thoracocentesis with 1.5 L of fluid removal.  Ultrasound-guided thoracocentesis has been requested with fluid study.  Currently on 2 L of oxygen by nasal cannula saturating around 98%.  We will continue to monitor while in hospital.  Neutropenia WBC of 1.0, absolute neutrophils 0.3.  WBC of 1.4 today.  Patient is afebrile but concern for sepsis.  CT could not rule out consolidation.  On empiric antibiotic with vancomycin and cefepime.  Procalcitonin < 0.10.  MRSA PCR was negative. Blood culture and urine cultures are pending.  Will discontinue vancomycin.    Hypotension Was initially given 500 mL of LR bolus.  She does have significant pleural effusion as well.  Antihypertensives on hold.  Patient is on amlodipine losartan and metoprolol at home.   Hyponatremia We will continue to monitor.  Sodium of 129 today.  Received IV fluids.  Acute kidney injury. Creatinine of 2.03 on admission from prior creatinine of 1.24.  Creatinine of 1.7 today and downtrending.  Received IV fluids.  Continue to monitor renal function closely.  History of stage IV metastatic breast cancer to lungs and bones Follows with oncologist Dr. [name].  Will notify oncology team.  Chemo-induced peripheral neuropathy Continue gabapentin, renally dosed.  Type 2 diabetes Continue to hold Actos and Janumet.  Takes 24 units of Lantus at home.  Continue moderate sliding scale insulin, Accu-Cheks for now.    Hyperlipidemia Continue statin, fenofibrate  Right shoulder pain/pathologic fx of proximal right humerus with angulation  Has known metastatic disease to peri-glenoid region of right  scapula back in 2019.   Now noted to have new humerus fracture.  Communicated with orthopedics Dr. Doreatha Martin.  DVT prophylaxis: SCDs Start: 01/05/21 2046   Code Status: Full code  Family Communication: spoke with the patient's  husband at bedside updated him about the clinical condition of the patient.   Status is: Inpatient  Remains inpatient appropriate because:IV treatments appropriate due to intensity of illness or inability to take PO and Inpatient level of care appropriate due to severity of illness   Dispo: The patient is from: Home              Anticipated d/c is to: Home              Patient currently is not medically stable to d/c.   Difficult to place patient No  Consultants:  Oncology  Procedures:  Plan for thoracocentesis  Anti-infectives:  Marland Kitchen Vancomycin and cefepime given in the ED, continue with cefepime.  Anti-infectives (From admission, onward)   Start     Dose/Rate Route Frequency Ordered Stop   01/05/21 2100  vancomycin (VANCOREADY) IVPB 1500 mg/300 mL        1,500 mg 150 mL/hr over 120 Minutes Intravenous  Once 01/05/21 2055 01/06/21 0305   01/05/21 2100  ceFEPIme (MAXIPIME) 2 g in sodium chloride 0.9 % 100 mL IVPB        2 g 200 mL/hr over 30 Minutes Intravenous  Once 01/05/21 2055 01/06/21 0355     Subjective: Today, patient was seen and examined at bedside. Complains of right upper extremity pain and difficulty moving the arm.  Denies shortness of breath cough fever.  Patient's husband at bedside.  Objective: Vitals:   01/06/21 0400 01/06/21 0739  BP: (!) 103/53 104/79  Pulse: 84 84  Resp:  15  Temp: 97.7 F (36.5 C) 98.4 F (36.9 C)  SpO2: 100% 99%    Intake/Output Summary (Last 24 hours) at 01/06/2021 0757 Last data filed at 01/06/2021 0445 Gross per 24 hour  Intake 400.19 ml  Output 500 ml  Net -99.81 ml   Filed Weights   01/05/21 1508  Weight: 99.3 kg   Body mass index is 37.59 kg/m.   Physical Exam: GENERAL: Obese, patient is alert awake and oriented. Not in obvious distress.  On 2 L of oxygen by nasal cannula HENT: No scleral pallor or icterus. Pupils equally reactive to light. Oral mucosa is moist. NECK: is supple, no gross swelling noted. CHEST:  Diminished breath sounds bilaterally. CVS: S1 and S2 heard, no murmur. Regular rate and rhythm.  ABDOMEN: Soft, non-tender, bowel sounds are present. EXTREMITIES: Bilateral lower extremity pitting edema noted.  Right upper extremity tenderness on palpation. CNS: Cranial nerves are intact. No focal motor deficits. SKIN: warm and dry without rashes.  Data Review: I have personally reviewed the following laboratory data and studies,  CBC: Recent Labs  Lab 01/05/21 1911 01/06/21 0134  WBC 1.0* 1.4*  NEUTROABS 0.3*  --   HGB 10.4* 7.9*  HCT 33.7* 25.8*  MCV 100.0 101.6*  PLT 418* 867*   Basic Metabolic Panel: Recent Labs  Lab 01/05/21 1911 01/06/21 0134  NA 128* 129*  K 5.0 4.8  CL 92* 94*  CO2 28 26  GLUCOSE 107* 117*  BUN 33* 31*  CREATININE 2.03* 1.72*  CALCIUM 8.8* 8.9   Liver Function Tests: Recent Labs  Lab 01/05/21 1911  AST 17  ALT 10  ALKPHOS 103  BILITOT 0.5  PROT 6.0*  ALBUMIN 2.7*  No results for input(s): LIPASE, AMYLASE in the last 168 hours. No results for input(s): AMMONIA in the last 168 hours. Cardiac Enzymes: No results for input(s): CKTOTAL, CKMB, CKMBINDEX, TROPONINI in the last 168 hours. BNP (last 3 results) No results for input(s): BNP in the last 8760 hours.  ProBNP (last 3 results) No results for input(s): PROBNP in the last 8760 hours.  CBG: No results for input(s): GLUCAP in the last 168 hours. Recent Results (from the past 240 hour(s))  SARS CORONAVIRUS 2 (TAT 6-24 HRS) Nasopharyngeal Nasopharyngeal Swab     Status: None   Collection Time: 01/03/21 12:53 PM   Specimen: Nasopharyngeal Swab  Result Value Ref Range Status   SARS Coronavirus 2 NEGATIVE NEGATIVE Final    Comment: (NOTE) SARS-CoV-2 target nucleic acids are NOT DETECTED.  The SARS-CoV-2 RNA is generally detectable in upper and lower respiratory specimens during the acute phase of infection. Negative results do not preclude SARS-CoV-2 infection, do not rule  out co-infections with other pathogens, and should not be used as the sole basis for treatment or other patient management decisions. Negative results must be combined with clinical observations, patient history, and epidemiological information. The expected result is Negative.  Fact Sheet for Patients: SugarRoll.be  Fact Sheet for Healthcare Providers: https://www.woods-mathews.com/  This test is not yet approved or cleared by the [place] FDA and  has been authorized for detection and/or diagnosis of SARS-CoV-2 by FDA under an Emergency Use Authorization (EUA). This EUA will remain  in effect (meaning this test can be used) for the duration of the COVID-19 declaration under Se ction 564(b)(1) of the Act, 21 U.S.C. section 360bbb-3(b)(1), unless the authorization is terminated or revoked sooner.  Performed at [facility], [place] [address] [place], [place] [address]   Resp Panel by RT-PCR (Flu A&B, Covid) Nasopharyngeal Swab     Status: None   Collection Time: 01/05/21  4:20 PM   Specimen: Nasopharyngeal Swab; Nasopharyngeal(NP) swabs in vial transport medium  Result Value Ref Range Status   SARS Coronavirus 2 by RT PCR NEGATIVE NEGATIVE Final    Comment: (NOTE) SARS-CoV-2 target nucleic acids are NOT DETECTED.  The SARS-CoV-2 RNA is generally detectable in upper respiratory specimens during the acute phase of infection. The lowest concentration of SARS-CoV-2 viral copies this assay can detect is 138 copies/mL. A negative result does not preclude SARS-Cov-2 infection and should not be used as the sole basis for treatment or other patient management decisions. A negative result may occur with  improper specimen collection/handling, submission of specimen other than nasopharyngeal swab, presence of viral mutation(s) within the areas targeted by this assay, and inadequate number of viral copies(<138 copies/mL). A negative result  must be combined with clinical observations, patient history, and epidemiological information. The expected result is Negative.  Fact Sheet for Patients:  EntrepreneurPulse.com.au  Fact Sheet for Healthcare Providers:  IncredibleEmployment.be  This test is no t yet approved or cleared by the [place] FDA and  has been authorized for detection and/or diagnosis of SARS-CoV-2 by FDA under an Emergency Use Authorization (EUA). This EUA will remain  in effect (meaning this test can be used) for the duration of the COVID-19 declaration under Section 564(b)(1) of the Act, 21 U.S.C.section 360bbb-3(b)(1), unless the authorization is terminated  or revoked sooner.       Influenza A by PCR NEGATIVE NEGATIVE Final   Influenza B by PCR NEGATIVE NEGATIVE Final    Comment: (NOTE) The Xpert Xpress SARS-CoV-2/FLU/RSV plus  assay is intended as an aid in the diagnosis of influenza from Nasopharyngeal swab specimens and should not be used as a sole basis for treatment. Nasal washings and aspirates are unacceptable for Xpert Xpress SARS-CoV-2/FLU/RSV testing.  Fact Sheet for Patients: EntrepreneurPulse.com.au  Fact Sheet for Healthcare Providers: IncredibleEmployment.be  This test is not yet approved or cleared by the [place] FDA and has been authorized for detection and/or diagnosis of SARS-CoV-2 by FDA under an Emergency Use Authorization (EUA). This EUA will remain in effect (meaning this test can be used) for the duration of the COVID-19 declaration under Section 564(b)(1) of the Act, 21 U.S.C. section 360bbb-3(b)(1), unless the authorization is terminated or revoked.  Performed at [facility], [place] [address], [place], [place] [address]   MRSA PCR Screening     Status: None   Collection Time: 01/06/21  6:22 AM   Specimen: Nasal Mucosa; Nasopharyngeal  Result Value Ref Range Status    MRSA by PCR NEGATIVE NEGATIVE Final    Comment:        The GeneXpert MRSA Assay (FDA approved for NASAL specimens only), is one component of a comprehensive MRSA colonization surveillance program. It is not intended to diagnose MRSA infection nor to guide or monitor treatment for MRSA infections. Performed at [facility], [place] [address] [place], [place] [address]      Studies: DG Chest 2 View  Result Date: 01/05/2021 CLINICAL DATA:  Worsening shortness of breath with history of right-sided metastatic breast cancer and lung cancer. EXAM: CHEST - 2 VIEW COMPARISON:  November 21, 2020 FINDINGS: There is complete opacification of the left hemithorax. This is increased in severity when compared to the prior study. The right lung is clear. The heart size and mediastinal contours are within normal limits. Marked severity calcification of the aortic arch is noted. Radiopaque surgical clips are seen overlying the lateral aspect of the right hemithorax. A linear cortical defect is seen overlying the proximal shaft of the right humerus. This represents a new finding when compared to the prior study. IMPRESSION: 1. Complete opacification of the left hemithorax, likely secondary to an extensive amount of atelectasis, infiltrate and associated pleural effusion. Correlation with chest CT is recommended. 2. Additional findings suggestive of a nondisplaced fracture of the proximal right humerus. Correlation with physical examination and patient history is recommended. Electronically Signed   By: Virgina Norfolk M.D.   On: 01/05/2021 17:41   DG Humerus Right  Result Date: 01/06/2021 CLINICAL DATA:  Possible fracture on chest radiograph. Pain for several days. No known injury. EXAM: RIGHT HUMERUS - 2+ VIEW COMPARISON:  10/24/2020 right shoulder radiographs. FINDINGS: Transverse fracture of the proximal right humeral neck with mild lateral angulation of the distal fracture fragment. The  fracture appears to be old with callus formation present although union is incomplete. Mild poorly defined decreased attenuation in the bone at the level of the fracture may indicate a pathologic fracture. The lesion was demonstrated in the proximal right humerus on the previous shoulder study. No expansile changes. Soft tissues are unremarkable. IMPRESSION: Transverse pathologic fracture of the proximal right humerus with mild angulation. Callus formation is present without complete union. Electronically Signed   By: Lucienne Capers M.D.   On: 01/06/2021 00:28      Flora Lipps, MD  [facility] 01/06/2021  If 7PM-7AM, please contact night-coverage

## 2021-01-06 NOTE — Progress Notes (Unsigned)
ADDENDUM:  Will start fulvesttrant/alpelisib when patient sees me 4/28

## 2021-01-06 NOTE — Progress Notes (Signed)
DISCONTINUE OFF PATHWAY REGIMEN - Breast   [id number]:CMF (IV cyclophosphamide) q21 days:   A cycle is every 21 days:     Cyclophosphamide      Methotrexate      Fluorouracil   **Always confirm dose/schedule in your pharmacy ordering system**  REASON: Toxicities / Adverse Event PRIOR TREATMENT: Off Pathway: CMF (IV cyclophosphamide) q21 days TREATMENT RESPONSE: Unable to Evaluate  START OFF PATHWAY REGIMEN - Breast   [id number]:Alpelisib 300 mg PO Daily D1-28 + Fulvestrant 500 mg IM D1,15/D1 q28 Days:   Cycle 1: A cycle is 28 days:     Alpelisib      Fulvestrant    Cycles 2 and beyond: A cycle is every 28 days:     Alpelisib      Fulvestrant   **Always confirm dose/schedule in your pharmacy ordering system**  Patient Characteristics: Distant Metastases or Locoregional Recurrent Disease - Unresected or Locally Advanced Unresectable Disease Progressing after Neoadjuvant and Local Therapies, HER2 Negative/Unknown/Equivocal, ER Positive, Molecular Alterations (BRCAm, MSI-H/dMMR, TMB-H,  or NTRK Gene Fusion), Other Therapeutic Status: Distant Metastases ER Status: Positive (+) HER2 Status: Negative (-) PR Status: Positive (+) Therapy Approach Indicated: Molecular Alteration Present (BRCA-Mutated, MSI-H/dMMR, TMB-H, or NTRK Gene Fusion-Positive) BRCA Mutation Status: Absent Microsatellite/Mismatch Repair Status: Unknown NTRK Gene Fusion Status: Quantity Not Sufficient Other Mutations/Biomarkers: Yes Tumor Mutational Burden (TMB): Unknown Intent of Therapy: Non-Curative / Palliative Intent, Discussed with Patient

## 2021-01-06 NOTE — Telephone Encounter (Signed)
Oral Oncology Patient Advocate Encounter  Prior Authorization for Mia [name] Draft has been approved.    PA# [id number] Effective dates: 12/07/20 through 01/06/22  Patient must fill at [facility] will continue to follow.   [place] Patient [place] Phone [phone] Fax [phone] 01/06/2021 10:22 AM

## 2021-01-06 NOTE — Progress Notes (Signed)
Mia [name]   DOB:06/26/1956   NL#:[id number]   XTK#:[id number]  Subjective:  Mia [name] was admitted yesterday with hypoxia and uncontrolled pain, as well as inability to urinate. She was also experiencing involuntary jerking motions. In the ED CXR showed complete opacification of the left hemithorax. [She had been scheduled for thoracentesis this AM at [facility].] She was also dehydrated. Pain mostly refers to the right shoulder [she had to cancel an MRI to the right shoulder 4/6 because she couldn't lay flat for the test]. She also has some back pain when she ambulates--she has no pain when sitting.  Since admission she has urinated x2 and feels like she needs to go again. Bowel movements are normal. She denies weakness or paresthesias in the legs. Husband Mia [name].   Objective: white woman examined in bed Vitals:   01/06/21 0400 01/06/21 0739  BP: (!) 103/53 104/79  [name]: 84 84  Resp:  15  Temp: 97.7 F (36.5 C) 98.4 F (36.9 C)  SpO2: 100% 99%    Body mass index is 37.59 kg/m.  Intake/Output Summary (Last 24 hours) at 01/06/2021 0901 Last data filed at 01/06/2021 0445 Gross per 24 hour  Intake 400.19 ml  Output 500 ml  Net -99.81 ml     Sclerae unicteric  Lungs no rales or wheezes--auscultated anterolaterally  Heart regular rate and rhythm  Abdomen soft, +BS  Neuro nonfocal--moves both legs w/o difficulty, no sensory level  Breast exam: deferred  CBG (last 3)  No results for input(s): GLUCAP in the last 72 hours.   Labs:  Lab Results  Component Value Date   WBC 1.4 (LL) 01/06/2021   HGB 7.9 (L) 01/06/2021   HCT 25.8 (L) 01/06/2021   MCV 101.6 (H) 01/06/2021   PLT 456 (H) 01/06/2021   NEUTROABS 0.3 (LL) 01/05/2021    '@LASTCHEMISTRY' @  Urine Studies No results for input(s): UHGB, CRYS in the last 72 hours.  Invalid input(s): UACOL, UAPR, USPG, UPH, UTP, UGL, UKET, UBIL, UNIT, UROB, ULEU, UEPI, UWBC, URBC, UBAC, CAST, UCOM, BILUA  Basic Metabolic  Panel: Recent Labs  Lab 01/05/21 1911 01/06/21 0134  NA 128* 129*  K 5.0 4.8  CL 92* 94*  CO2 28 26  GLUCOSE 107* 117*  BUN 33* 31*  CREATININE 2.03* 1.72*  CALCIUM 8.8* 8.9   GFR Estimated Creatinine Clearance: 37.8 mL/min (A) (by C-G formula based on SCr of 1.72 mg/dL (H)). Liver Function Tests: Recent Labs  Lab 01/05/21 1911  AST 17  ALT 10  ALKPHOS 103  BILITOT 0.5  PROT 6.0*  ALBUMIN 2.7*   No results for input(s): LIPASE, AMYLASE in the last 168 hours. No results for input(s): AMMONIA in the last 168 hours. Coagulation profile No results for input(s): INR, PROTIME in the last 168 hours.  CBC: Recent Labs  Lab 01/05/21 1911 01/06/21 0134  WBC 1.0* 1.4*  NEUTROABS 0.3*  --   HGB 10.4* 7.9*  HCT 33.7* 25.8*  MCV 100.0 101.6*  PLT 418* 456*   Cardiac Enzymes: No results for input(s): CKTOTAL, CKMB, CKMBINDEX, TROPONINI in the last 168 hours. BNP: Invalid input(s): POCBNP CBG: No results for input(s): GLUCAP in the last 168 hours. D-Dimer No results for input(s): DDIMER in the last 72 hours. Hgb A1c No results for input(s): HGBA1C in the last 72 hours. Lipid Profile No results for input(s): CHOL, HDL, LDLCALC, TRIG, CHOLHDL, LDLDIRECT in the last 72 hours. Thyroid function studies No results for input(s): TSH, T4TOTAL, T3FREE, THYROIDAB in the last 72  hours.  Invalid input(s): FREET3 Anemia work up No results for input(s): VITAMINB12, FOLATE, FERRITIN, TIBC, IRON, RETICCTPCT in the last 72 hours. Microbiology Recent Results (from the past 240 hour(s))  SARS CORONAVIRUS 2 (TAT 6-24 HRS) Nasopharyngeal Nasopharyngeal Swab     Status: None   Collection Time: 01/03/21 12:53 PM   Specimen: Nasopharyngeal Swab  Result Value Ref Range Status   SARS Coronavirus 2 NEGATIVE NEGATIVE Final    Comment: (NOTE) SARS-CoV-2 target nucleic acids are NOT DETECTED.  The SARS-CoV-2 RNA is generally detectable in upper and lower respiratory specimens during the  acute phase of infection. Negative results do not preclude SARS-CoV-2 infection, do not rule out co-infections with other pathogens, and should not be used as the sole basis for treatment or other patient management decisions. Negative results must be combined with clinical observations, patient history, and epidemiological information. The expected result is Negative.  Fact Sheet for Patients: SugarRoll.be  Fact Sheet for Healthcare Providers: https://www.woods-mathews.com/  This test is not yet approved or cleared by the [place] FDA and  has been authorized for detection and/or diagnosis of SARS-CoV-2 by FDA under an Emergency Use Authorization (EUA). This EUA will remain  in effect (meaning this test can be used) for the duration of the COVID-19 declaration under Se ction 564(b)(1) of the Act, 21 U.S.C. section 360bbb-3(b)(1), unless the authorization is terminated or revoked sooner.  Performed at [facility], [place] [address], [place], [place] [address]   Resp Panel by RT-PCR (Flu A&B, Covid) Nasopharyngeal Swab     Status: None   Collection Time: 01/05/21  4:20 PM   Specimen: Nasopharyngeal Swab; Nasopharyngeal(NP) swabs in vial transport medium  Result Value Ref Range Status   SARS Coronavirus 2 by RT PCR NEGATIVE NEGATIVE Final    Comment: (NOTE) SARS-CoV-2 target nucleic acids are NOT DETECTED.  The SARS-CoV-2 RNA is generally detectable in upper respiratory specimens during the acute phase of infection. The lowest concentration of SARS-CoV-2 viral copies this assay can detect is 138 copies/mL. A negative result does not preclude SARS-Cov-2 infection and should not be used as the sole basis for treatment or other patient management decisions. A negative result may occur with  improper specimen collection/handling, submission of specimen other than nasopharyngeal swab, presence of viral mutation(s) within the areas  targeted by this assay, and inadequate number of viral copies(<138 copies/mL). A negative result must be combined with clinical observations, patient history, and epidemiological information. The expected result is Negative.  Fact Sheet for Patients:  EntrepreneurPulse.com.au  Fact Sheet for Healthcare Providers:  IncredibleEmployment.be  This test is no t yet approved or cleared by the [place] FDA and  has been authorized for detection and/or diagnosis of SARS-CoV-2 by FDA under an Emergency Use Authorization (EUA). This EUA will remain  in effect (meaning this test can be used) for the duration of the COVID-19 declaration under Section 564(b)(1) of the Act, 21 U.S.C.section 360bbb-3(b)(1), unless the authorization is terminated  or revoked sooner.       Influenza A by PCR NEGATIVE NEGATIVE Final   Influenza B by PCR NEGATIVE NEGATIVE Final    Comment: (NOTE) The Xpert Xpress SARS-CoV-2/FLU/RSV plus assay is intended as an aid in the diagnosis of influenza from Nasopharyngeal swab specimens and should not be used as a sole basis for treatment. Nasal washings and aspirates are unacceptable for Xpert Xpress SARS-CoV-2/FLU/RSV testing.  Fact Sheet for Patients: EntrepreneurPulse.com.au  Fact Sheet for Healthcare Providers: IncredibleEmployment.be  This test  is not yet approved or cleared by the [place] and has been authorized for detection and/or diagnosis of SARS-CoV-2 by FDA under an Emergency Use Authorization (EUA). This EUA will remain in effect (meaning this test can be used) for the duration of the COVID-19 declaration under Section 564(b)(1) of the Act, 21 U.S.C. section 360bbb-3(b)(1), unless the authorization is terminated or revoked.  Performed at [facility], [place] [address], [place], [place] [address]   MRSA PCR Screening     Status: None   Collection  Time: 01/06/21  6:22 AM   Specimen: Nasal Mucosa; Nasopharyngeal  Result Value Ref Range Status   MRSA by PCR NEGATIVE NEGATIVE Final    Comment:        The GeneXpert MRSA Assay (FDA approved for NASAL specimens only), is one component of a comprehensive MRSA colonization surveillance program. It is not intended to diagnose MRSA infection nor to guide or monitor treatment for MRSA infections. Performed at [facility], [place] [address], [place], [place] [address]       Studies:  DG Chest 2 View  Result Date: 01/05/2021 CLINICAL DATA:  Worsening shortness of breath with history of right-sided metastatic breast cancer and lung cancer. EXAM: CHEST - 2 VIEW COMPARISON:  November 21, 2020 FINDINGS: There is complete opacification of the left hemithorax. This is increased in severity when compared to the prior study. The right lung is clear. The heart size and mediastinal contours are within normal limits. Marked severity calcification of the aortic arch is noted. Radiopaque surgical clips are seen overlying the lateral aspect of the right hemithorax. A linear cortical defect is seen overlying the proximal shaft of the right humerus. This represents a new finding when compared to the prior study. IMPRESSION: 1. Complete opacification of the left hemithorax, likely secondary to an extensive amount of atelectasis, infiltrate and associated pleural effusion. Correlation with chest CT is recommended. 2. Additional findings suggestive of a nondisplaced fracture of the proximal right humerus. Correlation with physical examination and patient history is recommended. Electronically Signed   By: Virgina Norfolk M.D.   On: 01/05/2021 17:41   DG Humerus Right  Result Date: 01/06/2021 CLINICAL DATA:  Possible fracture on chest radiograph. Pain for several days. No known injury. EXAM: RIGHT HUMERUS - 2+ VIEW COMPARISON:  10/24/2020 right shoulder radiographs. FINDINGS: Transverse fracture of  the proximal right humeral neck with mild lateral angulation of the distal fracture fragment. The fracture appears to be old with callus formation present although union is incomplete. Mild poorly defined decreased attenuation in the bone at the level of the fracture may indicate a pathologic fracture. The lesion was demonstrated in the proximal right humerus on the previous shoulder study. No expansile changes. Soft tissues are unremarkable. IMPRESSION: Transverse pathologic fracture of the proximal right humerus with mild angulation. Callus formation is present without complete union. Electronically Signed   By: Lucienne Capers M.D.   On: 01/06/2021 00:28    Assessment: 65 y.o. [place], [place] woman with stage IV breast cancer, as follows:  (1) status post right mastectomy in 2000, for a pT2 pN2, stage IIIA invasive ductal carcinoma, estrogen receptor positive, progesterone receptor not tested, HER-2 negative (0) by immunohistochemistry             (a) adjuvant chemotherapy with doxorubicin and cyclophosphamide in dose dense fashion x4 followed by paclitaxel in dose dense fashion x4             (b)  adjuvant radiation: 30 doses             (c) antiestrogens: Tamoxifen for 5 years, completed 2005  (2) METASTASTIC DISEASE: 2008, involving bones, lungs, and lymph nodes             (a) CA 27-29 is informative             (b) CT of the chest abdomen and pelvis in [facility] finds stable sclerotic metastases (T8, T10, T11, sternum, L4, L5); 0.6 cm left upper lobe lung nodule stable  (3)  prior anti-estrogen treatments:             (a) fulvestrant--progression             (b) exemestane/everolimus--hyperlipidemia, hyperglycemia  (4) prior chemotherapy treatments:             (a) capecitabine: progression             (b) Abraxane, August 2014 through 11/18/2015: good response but stopped due to neuropathy             (c) gemcitabine 01/20/2016--?; multiple interruptions secondary to  infections  (5) radiation therapy:             (a) T spine and Right femur, completed 01/10/2016  (6) letrozole/ palbociclib started 07/13/2016, discontinued 10/26/2020 with evidence of progression  (7) bone treatment:             (a) denosumab/Xgeva--discontinued January 2016 due to osteonecrosis of the jaw  (8) cancer associated pain: Updated 12/01/2020      (a) OxyIR 5 mg QID PRN             (b) PMP Aware reviewed 12/01/2020             (c) OxyContin 20 mg p.o. 3 times daily             (d) bowel prophylaxis: MiraLAX as needed  (9) restaging studies:             (a) mostly stable bone lesions with evidence of progression at L3-L5; no epidural tumor by total spinal MRI in November 2019             (b) no evidence of progressive lung, lymph node or liver lesions by CT scans of the chest abdomen and pelvis and bone scan December 2019             (c) CT scans abd/pelvis 03/31/2019 shows stable bone metastases, no extraosseous disease             (d) MRI pelvis 04/09/2019 shows bilateral ilium, left acetabulum and lumbar mets, stable             (e) CT abd/pelvis 09/01/2019 shows stable bone mets, slight increase left effusion             (f) CT of the chest 05/05/2020 again shows minimal increase in the left pleural effusion, possible resorption of bone around the left glenoid lesion, otherwise stable             (g) CT of the chest abdomen and pelvis 10/18/2020 again shows no evidence of visceral disease, but there is apparent progression in the bone disease, and some enlargement of the left pleural effusion.  The CA 27-29 has also been rising.  Accordingly her letrozole and palbociclib are being discontinued  (10) palliative radiation 11/18/2018 through 12/02/2018 Site/dose:1. Lumbar spine; 35 Gy in 14 fractions of 2.5 Gy 2. Pelvis; 35 Gy in  14 fractions of 2.5 Gy  (11) started cyclophosphamide, methotrexate and fluorouracil [CMF] 11/10/2020              (a) methotrexate held first dose, during palliative radiation  (b) CMF discontinued after 3d dose (12/22/2020) with no evicence of response  (12) status post left thoracentesis 11/21/2020, with positive cytology             (a) tumor cells are strongly estrogen and progesterone receptor positive, HER-2 negative             (b) foundation 1 requested on the thoracentesis sample shows a PI K3 CA mutation.  Microsatellite status is stable and the tumor mutation burden is low.  There are mutations in ESR 1, GAT A3, and [place] 6   Plan:  Mia [name] is now day 16 cycle 3 CMF chemotherapy. Her WBC is recovering and should normalize within 48-72 hours. She is anemic and likely will benefit from transfusion this admission.  I was concerned she might have meningeal carcinomatosis as the explanation for her inability to urinate but in the absence of other signs/symptoms (see "subjective" above) I think the urination problem is more likely due to narcotic effect on the urethral sphincter. The involuntary jerking is due to the same cause. I discussed decreasing her oxycontin to BID (she was not having these problems on that dose) and she is agreeable-- order entered.  She will benefit from thoracentesis as ordered. I don't think she needs a pleurx at this point.  The big issue is the right shoulder pain. Possibly a sling, possibly a steroid injection may help-- ortho consult has been placed. She is scheduled for right shoulder MRI 01/23/2021 but it would be helpful if that could be done this admission.  She has an appointment at the cancer center 4/21 which I will cancel. She will see me 4/28 and we will start a new treatment on that date, specifics to be decided.  I will be back on Monday. Please let my partners know if we can be of further help.     Chauncey Cruel, MD 01/06/2021  9:01 AM Medical Oncology and Hematology [facility] [address] [place], [place] [address] Tel.  [phone]    Fax. [phone]

## 2021-01-07 ENCOUNTER — Inpatient Hospital Stay (HOSPITAL_COMMUNITY): Payer: MEDICARE

## 2021-01-07 DIAGNOSIS — J9601 Acute respiratory failure with hypoxia: Secondary | ICD-10-CM | POA: Diagnosis not present

## 2021-01-07 DIAGNOSIS — J90 Pleural effusion, not elsewhere classified: Secondary | ICD-10-CM | POA: Diagnosis not present

## 2021-01-07 DIAGNOSIS — N179 Acute kidney failure, unspecified: Secondary | ICD-10-CM | POA: Diagnosis not present

## 2021-01-07 DIAGNOSIS — C7951 Secondary malignant neoplasm of bone: Secondary | ICD-10-CM | POA: Diagnosis not present

## 2021-01-07 LAB — CBC WITH DIFFERENTIAL/PLATELET
Abs Immature Granulocytes: 0.18 10*3/uL — ABNORMAL HIGH (ref 0.00–0.07)
Basophils Absolute: 0 10*3/uL (ref 0.0–0.1)
Basophils Relative: 1 %
Eosinophils Absolute: 0.1 10*3/uL (ref 0.0–0.5)
Eosinophils Relative: 8 %
HCT: 21.2 % — ABNORMAL LOW (ref 36.0–46.0)
Hemoglobin: 6.5 g/dL — CL (ref 12.0–15.0)
Immature Granulocytes: 12 %
Lymphocytes Relative: 9 %
Lymphs Abs: 0.1 10*3/uL — ABNORMAL LOW (ref 0.7–4.0)
MCH: 30.8 pg (ref 26.0–34.0)
MCHC: 30.7 g/dL (ref 30.0–36.0)
MCV: 100.5 fL — ABNORMAL HIGH (ref 80.0–100.0)
Monocytes Absolute: 0.9 10*3/uL (ref 0.1–1.0)
Monocytes Relative: 59 %
Neutro Abs: 0.2 10*3/uL — CL (ref 1.7–7.7)
Neutrophils Relative %: 11 %
Platelets: 577 10*3/uL — ABNORMAL HIGH (ref 150–400)
RBC: 2.11 MIL/uL — ABNORMAL LOW (ref 3.87–5.11)
RDW: 18.4 % — ABNORMAL HIGH (ref 11.5–15.5)
WBC: 1.5 10*3/uL — ABNORMAL LOW (ref 4.0–10.5)
nRBC: 0 % (ref 0.0–0.2)

## 2021-01-07 LAB — BASIC METABOLIC PANEL
Anion gap: 7 (ref 5–15)
BUN: 21 mg/dL (ref 8–23)
CO2: 29 mmol/L (ref 22–32)
Calcium: 8.5 mg/dL — ABNORMAL LOW (ref 8.9–10.3)
Chloride: 98 mmol/L (ref 98–111)
Creatinine, Ser: 1.21 mg/dL — ABNORMAL HIGH (ref 0.44–1.00)
GFR, Estimated: 50 mL/min — ABNORMAL LOW (ref >60)
Glucose, Bld: 98 mg/dL (ref 70–99)
Potassium: 5 mmol/L (ref 3.5–5.1)
Sodium: 134 mmol/L — ABNORMAL LOW (ref 135–145)

## 2021-01-07 LAB — GLUCOSE, CAPILLARY
Glucose-Capillary: 94 mg/dL (ref 70–99)
Glucose-Capillary: 112 mg/dL — ABNORMAL HIGH (ref 70–99)
Glucose-Capillary: 148 mg/dL — ABNORMAL HIGH (ref 70–99)

## 2021-01-07 LAB — MAGNESIUM: Magnesium: 1.8 mg/dL (ref 1.7–2.4)

## 2021-01-07 LAB — PREPARE RBC (CROSSMATCH)

## 2021-01-07 LAB — ABO/RH: ABO/RH(D): A NEG

## 2021-01-07 LAB — PROCALCITONIN: Procalcitonin: <0.1 ng/mL

## 2021-01-07 LAB — PHOSPHORUS: Phosphorus: 2.4 mg/dL — ABNORMAL LOW (ref 2.5–4.6)

## 2021-01-07 MED ORDER — GADOBUTROL 1 MMOL/ML IV SOLN
10.0000 mL | Freq: Once | INTRAVENOUS | Status: AC | PRN
  Administered 2021-01-07: 10 mL via INTRAVENOUS

## 2021-01-07 MED ORDER — K PHOS MONO-SOD PHOS DI & MONO 155-852-130 MG PO TABS
500.0000 mg | ORAL_TABLET | Freq: Three times a day (TID) | ORAL | Status: AC
  Administered 2021-01-07 (×3): 500 mg via ORAL
  Filled 2021-01-07 (×3): qty 2

## 2021-01-07 MED ORDER — SODIUM CHLORIDE 0.9% IV SOLUTION: Freq: Once | INTRAVENOUS | Status: AC

## 2021-01-07 MED ORDER — POLYETHYLENE GLYCOL 3350 17 G PO PACK
17.0000 g | PACK | Freq: Every day | ORAL | Status: DC
  Administered 2021-01-07 – 2021-01-08 (×2): 17 g via ORAL
  Filled 2021-01-07 (×6): qty 1

## 2021-01-07 MED ORDER — DOCUSATE SODIUM 100 MG PO CAPS
100.0000 mg | ORAL_CAPSULE | Freq: Two times a day (BID) | ORAL | Status: DC
  Administered 2021-01-07 – 2021-01-11 (×7): 100 mg via ORAL
  Filled 2021-01-07 (×9): qty 1

## 2021-01-07 MED ORDER — LIDOCAINE 5 % EX PTCH
2.0000 | MEDICATED_PATCH | CUTANEOUS | Status: DC
  Administered 2021-01-07 – 2021-01-13 (×5): 2 via TRANSDERMAL
  Filled 2021-01-07 (×8): qty 2

## 2021-01-07 NOTE — Progress Notes (Addendum)
PROGRESS NOTE  Mia [name] UQJ:[id number] DOB: 1955/11/13 DOA: 01/05/2021 PCP: Ronnald Nian, DO   LOS: 2 days   Brief narrative:   Mia [name] is a 65 y.o. female with medical history significant for stage IV metastatic breast cancer to lung and bones s/p right mastectomy, radiation on chemotherapy, history of malignant pleural effusion requiring thoracentesis, essential hypertension, insulin-dependent type 2 diabetes presented to hospital with difficulty urinating, shortness of breath and worsening tremors.  Patient does have chronic shortness of breath and tremors but had recently been worsening.   She had thoracentesis on 11/21/2020 with 1.5 L malignant effusion removed. She also notes chronic right-sided shoulder pain that started in January 2022 and has progressively gotten to the point where she can no longer lift her arm.  She was supposed to get an MRI to follow-up yesterday.   In the ED, patient was afebrile, borderline hypotensive, and had oxygen desaturation down to 78% requiring 2 L of O2.  Labs notable for significant neutropenia with WBC of 1, absolute neutrophils 0.3.  Hemoglobin 10.4, HCT of 33.7, Na of 128, Creatinine of 2.03 from 1.24.  Chest x-ray showed complete approximation of the left hemothorax. Post-void residual was zero and pt ultimately urinated in ED.  Patient was then admitted to hospital for further evaluation and treatment.  Assessment/Plan:  Principal Problem:   Acute respiratory failure with hypoxia (HCC) Active Problems:   Metastatic breast cancer ([place])   Bone metastases (HCC)   Lung metastases ([place])   Peripheral neuropathy due to chemotherapy ([place])   Diabetes mellitus type 2 in obese (HCC)   Pleural effusion   Hypotension   AKI (acute kidney injury) ([place])   Hyponatremia   Neutropenia (HCC)   Shoulder pain, right  Acute hypoxic respiratory failure secondary to left pleural effusion Chest x-ray in the ED showed large left pleural  effusion.  Patient was unable to get CT scan due to worsening renal insufficiency.  Patient did have thoracocentesis with 1.5 L of fluid removal.  Ultrasound-guided thoracocentesis has been requested with fluid study.  Currently on 2 L of oxygen by nasal cannula saturating around 98%.  We will continue to monitor while in hospital.  Feels little better after thoracocentesis.  Neutropenia WBC of 1.5, absolute neutrophils 0.2. Marland Kitchen  Patient is afebrile but concern for sepsis.  CT could not rule out consolidation.  On empiric antibiotic with cefepime.  Procalcitonin < 0.10.  MRSA PCR was negative. Blood culture negative.  Urine culture with 20,000 gram-negative rods.   Anemia likely from malignancy.  Hemoglobin of 6.4 today.  Will transfuse 1 L of packed RBC.   Hypotension Initially received lactated Ringer's.  Antihypertensives on hold.  Patient is on amlodipine losartan and metoprolol at home.   Hyponatremia With IV fluids.  Sodium today at 134.  Acute kidney injury. Creatinine of 2.03 on admission from prior creatinine of 1.24.  Currently at baseline.  Improved after IV fluids.  Creatinine from today at 1.2.  History of stage IV metastatic breast cancer to lungs and bones Follows with oncologist Dr. [name].  Seen by oncology team.  Chemo-induced peripheral neuropathy Continue gabapentin  Type 2 diabetes Continue to hold Actos and Janumet.   Continue moderate sliding scale insulin, Lantus Accu-Cheks for now.  Closely monitor blood glucose levels.  Latest POC glucose of 112  Hyperlipidemia Continue statin, fenofibrate  Right shoulder pain/pathologic fx of proximal right humerus with angulation  Has known metastatic disease to peri-glenoid region of right scapula back in  2019.   Now noted to have new humerus fracture.  Seen by orthopedics orthopedics Dr. Doreatha Martin.  MRI of the shoulder and humerus has been ordered.  Will follow orthopedic recommendation.  Focus on pain control.  Sling has  been ordered for right upper extremity.  Hypophosphatemia.  Will replace orally.  Check levels in a.m.  DVT prophylaxis: SCDs Start: 01/05/21 2046   Code Status: Full code  Family Communication:  I spoke with patient's daughter at bedside today.    Status is: Inpatient  Remains inpatient appropriate because:IV treatments appropriate due to intensity of illness or inability to take PO and Inpatient level of care appropriate due to severity of illness   Dispo: The patient is from: Home              Anticipated d/c is to: Home              Patient currently is not medically stable to d/c.   Difficult to place patient No  Consultants:  Oncology  Procedures:  Status post thoracocentesis on 01/06/2021.  Anti-infectives:  .  cefepime 4/7>  Anti-infectives (From admission, onward)   Start     Dose/Rate Route Frequency Ordered Stop   01/06/21 1400  ceFEPIme (MAXIPIME) 2 g in sodium chloride 0.9 % 100 mL IVPB        2 g 200 mL/hr over 30 Minutes Intravenous Every 12 hours 01/06/21 0807     01/05/21 2100  vancomycin (VANCOREADY) IVPB 1500 mg/300 mL        1,500 mg 150 mL/hr over 120 Minutes Intravenous  Once 01/05/21 2055 01/06/21 0305   01/05/21 2100  ceFEPIme (MAXIPIME) 2 g in sodium chloride 0.9 % 100 mL IVPB        2 g 200 mL/hr over 30 Minutes Intravenous  Once 01/05/21 2055 01/06/21 0355     Subjective: Today, patient was seen and examined at bedside.  Complains of right shoulder pain with difficulty moving.  Denies any shortness of breath fever chills.  Patient's daughter at bedside.  Has not had a bowel movement  Objective: Vitals:   01/07/21 0519 01/07/21 0831  BP: (!) 94/49 128/72  Pulse: 68   Resp: 18   Temp: 98.4 F (36.9 C)   SpO2: 98%     Intake/Output Summary (Last 24 hours) at 01/07/2021 1207 Last data filed at 01/07/2021 0930 Gross per 24 hour  Intake 660 ml  Output 2100 ml  Net -1440 ml   Filed Weights   01/05/21 1508  Weight: 99.3 kg   Body  mass index is 37.59 kg/m.   Physical Exam:  General: Obese built, not in obvious distress, alert awake communicative, on 2 L of oxygen by nasal cannula HENT:   No scleral pallor or icterus noted. Oral mucosa is moist.  Chest:   Diminished breath sounds bilaterally, but improved aeration on the left. CVS: S1 &S2 heard. No murmur.  Regular rate and rhythm. Abdomen: Soft, nontender, nondistended.  Bowel sounds are heard.   Extremities: No cyanosis, clubbing but trace pitting edema noted bilaterally.  Upper extremity tenderness from  fracture.  Peripheral pulses are palpable. Psych: Alert, awake and oriented, normal mood CNS:  No cranial nerve deficits.  Power equal in all extremities.   Skin: Warm and dry.  No rashes noted.   Data Review: I have personally reviewed the following laboratory data and studies,  CBC: Recent Labs  Lab 01/05/21 1911 01/06/21 0134 01/07/21 0441  WBC 1.0* 1.4* 1.5*  NEUTROABS 0.3*  --  0.2*  HGB 10.4* 7.9* 6.5*  HCT 33.7* 25.8* 21.2*  MCV 100.0 101.6* 100.5*  PLT 418* 456* 665*   Basic Metabolic Panel: Recent Labs  Lab 01/05/21 1911 01/06/21 0134 01/07/21 0441  NA 128* 129* 134*  K 5.0 4.8 5.0  CL 92* 94* 98  CO2 28 26 29   GLUCOSE 107* 117* 98  BUN 33* 31* 21  CREATININE 2.03* 1.72* 1.21*  CALCIUM 8.8* 8.9 8.5*  MG  --   --  1.8  PHOS  --   --  2.4*   Liver Function Tests: Recent Labs  Lab 01/05/21 1911  AST 17  ALT 10  ALKPHOS 103  BILITOT 0.5  PROT 6.0*  ALBUMIN 2.7*   No results for input(s): LIPASE, AMYLASE in the last 168 hours. No results for input(s): AMMONIA in the last 168 hours. Cardiac Enzymes: No results for input(s): CKTOTAL, CKMB, CKMBINDEX, TROPONINI in the last 168 hours. BNP (last 3 results) No results for input(s): BNP in the last 8760 hours.  ProBNP (last 3 results) No results for input(s): PROBNP in the last 8760 hours.  CBG: Recent Labs  Lab 01/06/21 1637 01/06/21 1935 01/07/21 0725 01/07/21 1127   GLUCAP 108* 135* 94 112*   Recent Results (from the past 240 hour(s))  SARS CORONAVIRUS 2 (TAT 6-24 HRS) Nasopharyngeal Nasopharyngeal Swab     Status: None   Collection Time: 01/03/21 12:53 PM   Specimen: Nasopharyngeal Swab  Result Value Ref Range Status   SARS Coronavirus 2 NEGATIVE NEGATIVE Final    Comment: (NOTE) SARS-CoV-2 target nucleic acids are NOT DETECTED.  The SARS-CoV-2 RNA is generally detectable in upper and lower respiratory specimens during the acute phase of infection. Negative results do not preclude SARS-CoV-2 infection, do not rule out co-infections with other pathogens, and should not be used as the sole basis for treatment or other patient management decisions. Negative results must be combined with clinical observations, patient history, and epidemiological information. The expected result is Negative.  Fact Sheet for Patients: SugarRoll.be  Fact Sheet for Healthcare Providers: https://www.woods-mathews.com/  This test is not yet approved or cleared by the [place] FDA and  has been authorized for detection and/or diagnosis of SARS-CoV-2 by FDA under an Emergency Use Authorization (EUA). This EUA will remain  in effect (meaning this test can be used) for the duration of the COVID-19 declaration under Se ction 564(b)(1) of the Act, 21 U.S.C. section 360bbb-3(b)(1), unless the authorization is terminated or revoked sooner.  Performed at [facility], [place] [address], [place], [place] [address]   Resp Panel by RT-PCR (Flu A&B, Covid) Nasopharyngeal Swab     Status: None   Collection Time: 01/05/21  4:20 PM   Specimen: Nasopharyngeal Swab; Nasopharyngeal(NP) swabs in vial transport medium  Result Value Ref Range Status   SARS Coronavirus 2 by RT PCR NEGATIVE NEGATIVE Final    Comment: (NOTE) SARS-CoV-2 target nucleic acids are NOT DETECTED.  The SARS-CoV-2 RNA is generally detectable in upper  respiratory specimens during the acute phase of infection. The lowest concentration of SARS-CoV-2 viral copies this assay can detect is 138 copies/mL. A negative result does not preclude SARS-Cov-2 infection and should not be used as the sole basis for treatment or other patient management decisions. A negative result may occur with  improper specimen collection/handling, submission of specimen other than nasopharyngeal swab, presence of viral mutation(s) within the areas targeted by this assay, and inadequate number of viral  copies(<138 copies/mL). A negative result must be combined with clinical observations, patient history, and epidemiological information. The expected result is Negative.  Fact Sheet for Patients:  EntrepreneurPulse.com.au  Fact Sheet for Healthcare Providers:  IncredibleEmployment.be  This test is no t yet approved or cleared by the [place] FDA and  has been authorized for detection and/or diagnosis of SARS-CoV-2 by FDA under an Emergency Use Authorization (EUA). This EUA will remain  in effect (meaning this test can be used) for the duration of the COVID-19 declaration under Section 564(b)(1) of the Act, 21 U.S.C.section 360bbb-3(b)(1), unless the authorization is terminated  or revoked sooner.       Influenza A by PCR NEGATIVE NEGATIVE Final   Influenza B by PCR NEGATIVE NEGATIVE Final    Comment: (NOTE) The Xpert Xpress SARS-CoV-2/FLU/RSV plus assay is intended as an aid in the diagnosis of influenza from Nasopharyngeal swab specimens and should not be used as a sole basis for treatment. Nasal washings and aspirates are unacceptable for Xpert Xpress SARS-CoV-2/FLU/RSV testing.  Fact Sheet for Patients: EntrepreneurPulse.com.au  Fact Sheet for Healthcare Providers: IncredibleEmployment.be  This test is not yet approved or cleared by the [place] FDA and has been  authorized for detection and/or diagnosis of SARS-CoV-2 by FDA under an Emergency Use Authorization (EUA). This EUA will remain in effect (meaning this test can be used) for the duration of the COVID-19 declaration under Section 564(b)(1) of the Act, 21 U.S.C. section 360bbb-3(b)(1), unless the authorization is terminated or revoked.  Performed at [facility], [place] [address], [place], [place] [address]   Urine culture     Status: Abnormal (Preliminary result)   Collection Time: 01/05/21 10:00 PM   Specimen: Urine, Random  Result Value Ref Range Status   Specimen Description   Final    URINE, RANDOM Performed at [facility], [place] [address], [place], [place] [address]    Special Requests   Final    NONE Performed at [facility], [place] [address], [place], [place] [address]    Culture (A)  Final    20,000 COLONIES/mL GRAM NEGATIVE RODS SUSCEPTIBILITIES TO FOLLOW Performed at [facility], [place] [address], [place], [place] [address]    Report Status PENDING  Incomplete  MRSA PCR Screening     Status: None   Collection Time: 01/06/21  6:22 AM   Specimen: Nasal Mucosa; Nasopharyngeal  Result Value Ref Range Status   MRSA by PCR NEGATIVE NEGATIVE Final    Comment:        The GeneXpert MRSA Assay (FDA approved for NASAL specimens only), is one component of a comprehensive MRSA colonization surveillance program. It is not intended to diagnose MRSA infection nor to guide or monitor treatment for MRSA infections. Performed at [facility], [place] [address], [name], [place] [address]      Studies: DG Chest 1 View  Result Date: 01/06/2021 CLINICAL DATA:  Post LEFT thoracentesis removal of 1.2 L by report. EXAM: CHEST  1 VIEW COMPARISON:  CT chest from January 2022 and chest x-ray from January 05, 2021. FINDINGS: Cardiomediastinal contours largely obscured by persistent opacification of LEFT hemithorax.  Trachea midline. Crescentic graded opacity along the LEFT hemithorax suggest residual pleural fluid despite removal of 1.2 L. RIGHT lung is clear. No sign of pneumothorax on the LEFT following thoracentesis. Suspected pathologic fracture of the RIGHT proximal humerus is not visible on the current study. Changes of RIGHT axillary dissection. EKG  leads project over the chest. Calcification along the tract of previous catheter projects over the LEFT neck. IMPRESSION: 1. No sign of pneumothorax on the LEFT following LEFT thoracentesis with improved aeration but with persistent opacification of LEFT hemithorax with residual airspace disease and moderately large pleural effusion. 2. Suspected pathologic fracture RIGHT proximal humerus not visible on the current study. Electronically Signed   By: Zetta Bills M.D.   On: 01/06/2021 13:38   DG Chest 2 View  Result Date: 01/05/2021 CLINICAL DATA:  Worsening shortness of breath with history of right-sided metastatic breast cancer and lung cancer. EXAM: CHEST - 2 VIEW COMPARISON:  November 21, 2020 FINDINGS: There is complete opacification of the left hemithorax. This is increased in severity when compared to the prior study. The right lung is clear. The heart size and mediastinal contours are within normal limits. Marked severity calcification of the aortic arch is noted. Radiopaque surgical clips are seen overlying the lateral aspect of the right hemithorax. A linear cortical defect is seen overlying the proximal shaft of the right humerus. This represents a new finding when compared to the prior study. IMPRESSION: 1. Complete opacification of the left hemithorax, likely secondary to an extensive amount of atelectasis, infiltrate and associated pleural effusion. Correlation with chest CT is recommended. 2. Additional findings suggestive of a nondisplaced fracture of the proximal right humerus. Correlation with physical examination and patient history is recommended.  Electronically Signed   By: Virgina Norfolk M.D.   On: 01/05/2021 17:41   DG Humerus Right  Result Date: 01/06/2021 CLINICAL DATA:  Possible fracture on chest radiograph. Pain for several days. No known injury. EXAM: RIGHT HUMERUS - 2+ VIEW COMPARISON:  10/24/2020 right shoulder radiographs. FINDINGS: Transverse fracture of the proximal right humeral neck with mild lateral angulation of the distal fracture fragment. The fracture appears to be old with callus formation present although union is incomplete. Mild poorly defined decreased attenuation in the bone at the level of the fracture may indicate a pathologic fracture. The lesion was demonstrated in the proximal right humerus on the previous shoulder study. No expansile changes. Soft tissues are unremarkable. IMPRESSION: Transverse pathologic fracture of the proximal right humerus with mild angulation. Callus formation is present without complete union. Electronically Signed   By: Lucienne Capers M.D.   On: 01/06/2021 00:28   US THORACENTESIS ASP PLEURAL SPACE W/IMG GUIDE  Result Date: 01/06/2021 INDICATION: Patient with history of metastatic breast carcinoma, dyspnea, recurrent malignant left pleural effusion; request received for diagnostic and therapeutic left thoracentesis. EXAM: ULTRASOUND GUIDED DIAGNOSTIC AND THERAPEUTIC LEFT THORACENTESIS MEDICATIONS: 1% lidocaine to skin and subcutaneous tissue COMPLICATIONS: None immediate. PROCEDURE: An ultrasound guided thoracentesis was thoroughly discussed with the patient and questions answered. The benefits, risks, alternatives and complications were also discussed. The patient understands and wishes to proceed with the procedure. Written consent was obtained. Ultrasound was performed to localize and mark an adequate pocket of fluid in the left chest. The area was then prepped and draped in the normal sterile fashion. 1% Lidocaine was used for local anesthesia. Under ultrasound guidance a 6 Fr  Safe-T-Centesis catheter was introduced. Thoracentesis was performed. The catheter was removed and a dressing applied. FINDINGS: A total of approximately 1.2 liters of blood-tinged fluid was removed. Samples were sent to the laboratory as requested by the clinical team. Due to patient chest discomfort only the above amount of fluid was removed today. IMPRESSION: Successful ultrasound guided diagnostic and therapeutic left thoracentesis yielding 1.2 liters of pleural fluid.  Read by: Rowe Robert, PA-C Electronically Signed   By: Ruthann Cancer MD   On: 01/06/2021 13:14      Flora Lipps, MD  [facility] 01/07/2021  If 7PM-7AM, please contact night-coverage

## 2021-01-07 NOTE — Consult Note (Signed)
I was asked by Dr. Doreatha Martin to weigh in on the patient's shoulder - we have ordered MRI's of the shoulder and humerus to define any metastatic lesions to determine if ORIF vs arthroplasty may be more beneficial.  Pending results we will discuss the case further with the patient and try and determine the best course of action.

## 2021-01-07 NOTE — Progress Notes (Signed)
Date and time results received: 01/07/21 0645 (use smartphrase ".now" to insert current time)  Test: lab Critical Value: Hgb 6.5 & Absolute Neutraphils 0.2  Name of Provider Notified: X. Blount  Orders Received? Or Actions Taken?: Orders Received - See Orders for details

## 2021-01-07 NOTE — Progress Notes (Signed)
Blood stopped at this time due to patient stating discomfort to arm. Site looks and flushes fine.  IV nurse had accessed prior to starting blood, however , due to discomfort at this time, IV team has been reconsulted to try to find another IV site.

## 2021-01-07 NOTE — Plan of Care (Signed)
  Problem: Education: Goal: Knowledge of General Education information will improve Description: Including pain rating scale, medication(s)/side effects and non-pharmacologic comfort measures Outcome: Progressing   Problem: Clinical Measurements: Goal: Ability to maintain clinical measurements within normal limits will improve Outcome: Progressing Goal: Will remain free from infection Outcome: Progressing Goal: Diagnostic test results will improve Outcome: Progressing Goal: Respiratory complications will improve Outcome: Progressing   Problem: Coping: Goal: Level of anxiety will decrease Outcome: Progressing

## 2021-01-08 DIAGNOSIS — N179 Acute kidney failure, unspecified: Secondary | ICD-10-CM | POA: Diagnosis not present

## 2021-01-08 DIAGNOSIS — C7951 Secondary malignant neoplasm of bone: Secondary | ICD-10-CM | POA: Diagnosis not present

## 2021-01-08 DIAGNOSIS — J9601 Acute respiratory failure with hypoxia: Secondary | ICD-10-CM | POA: Diagnosis not present

## 2021-01-08 DIAGNOSIS — J90 Pleural effusion, not elsewhere classified: Secondary | ICD-10-CM | POA: Diagnosis not present

## 2021-01-08 LAB — SURGICAL PCR SCREEN
MRSA, PCR: NEGATIVE
Staphylococcus aureus: NEGATIVE

## 2021-01-08 LAB — CBC WITH DIFFERENTIAL/PLATELET
Abs Immature Granulocytes: 0.62 10*3/uL — ABNORMAL HIGH (ref 0.00–0.07)
Basophils Absolute: 0 10*3/uL (ref 0.0–0.1)
Basophils Relative: 2 %
Eosinophils Absolute: 0.3 10*3/uL (ref 0.0–0.5)
Eosinophils Relative: 12 %
HCT: 26.5 % — ABNORMAL LOW (ref 36.0–46.0)
Hemoglobin: 8.4 g/dL — ABNORMAL LOW (ref 12.0–15.0)
Immature Granulocytes: 27 %
Lymphocytes Relative: 8 %
Lymphs Abs: 0.2 10*3/uL — ABNORMAL LOW (ref 0.7–4.0)
MCH: 30.4 pg (ref 26.0–34.0)
MCHC: 31.7 g/dL (ref 30.0–36.0)
MCV: 96 fL (ref 80.0–100.0)
Monocytes Absolute: 1.1 10*3/uL — ABNORMAL HIGH (ref 0.1–1.0)
Monocytes Relative: 48 %
Neutro Abs: 0.1 10*3/uL — CL (ref 1.7–7.7)
Neutrophils Relative %: 3 %
Platelets: 654 10*3/uL — ABNORMAL HIGH (ref 150–400)
RBC: 2.76 MIL/uL — ABNORMAL LOW (ref 3.87–5.11)
RDW: 18.3 % — ABNORMAL HIGH (ref 11.5–15.5)
WBC: 2.3 10*3/uL — ABNORMAL LOW (ref 4.0–10.5)
nRBC: 0.9 % — ABNORMAL HIGH (ref 0.0–0.2)

## 2021-01-08 LAB — URINE CULTURE: Culture: 20000 — AB

## 2021-01-08 LAB — COMPREHENSIVE METABOLIC PANEL
ALT: 9 U/L (ref 0–44)
AST: 24 U/L (ref 15–41)
Albumin: 2.2 g/dL — ABNORMAL LOW (ref 3.5–5.0)
Alkaline Phosphatase: 83 U/L (ref 38–126)
Anion gap: 11 (ref 5–15)
BUN: 18 mg/dL (ref 8–23)
CO2: 25 mmol/L (ref 22–32)
Calcium: 8.4 mg/dL — ABNORMAL LOW (ref 8.9–10.3)
Chloride: 102 mmol/L (ref 98–111)
Creatinine, Ser: 1 mg/dL (ref 0.44–1.00)
GFR, Estimated: >60 mL/min (ref >60)
Glucose, Bld: 98 mg/dL (ref 70–99)
Potassium: 5.1 mmol/L (ref 3.5–5.1)
Sodium: 138 mmol/L (ref 135–145)
Total Bilirubin: 0.9 mg/dL (ref 0.3–1.2)
Total Protein: 5.2 g/dL — ABNORMAL LOW (ref 6.5–8.1)

## 2021-01-08 LAB — PHOSPHORUS: Phosphorus: 3.5 mg/dL (ref 2.5–4.6)

## 2021-01-08 LAB — TYPE AND SCREEN
ABO/RH(D): A NEG
Antibody Screen: NEGATIVE
Unit division: 0

## 2021-01-08 LAB — MAGNESIUM: Magnesium: 1.7 mg/dL (ref 1.7–2.4)

## 2021-01-08 LAB — BPAM RBC
Blood Product Expiration Date: 202205072359
ISSUE DATE / TIME: 202204091526
Unit Type and Rh: 600

## 2021-01-08 LAB — GLUCOSE, CAPILLARY
Glucose-Capillary: 105 mg/dL — ABNORMAL HIGH (ref 70–99)
Glucose-Capillary: 128 mg/dL — ABNORMAL HIGH (ref 70–99)
Glucose-Capillary: 111 mg/dL — ABNORMAL HIGH (ref 70–99)
Glucose-Capillary: 116 mg/dL — ABNORMAL HIGH (ref 70–99)

## 2021-01-08 MED ORDER — MUPIROCIN 2 % EX OINT
1.0000 "application " | TOPICAL_OINTMENT | Freq: Two times a day (BID) | CUTANEOUS | Status: AC
  Administered 2021-01-08 – 2021-01-13 (×9): 1 via NASAL
  Filled 2021-01-08: qty 22

## 2021-01-08 NOTE — H&P (View-Only) (Signed)
ORTHOPAEDIC CONSULTATION  REQUESTING PHYSICIAN: Flora Lipps, MD  Chief Complaint: Proximal humeral nonunion  HPI: Mia [name] is a 65 y.o. female with metastatic breast cancer and injury in January with a diagnosis of proximal humerus fracture.  At this point she is gone on the proximal humeral nonunion and I was asked to weigh in to consider fixation versus arthroplasty options.  Unfortunately patient is very debilitated by her injury.  She is admitted for hypoxic respiratory failure.  She is currently comfortable on nasal cannula in her bed and we discussed the case with the patient as well as her family.  She still has significant pain and dysfunction her right arm.  Past Medical History:  Diagnosis Date  . Breast cancer metastasized to bone ([place])   . Diabetes mellitus without complication ([place])   . History of radiation therapy 11/18/2018-12/02/2018   lumbar spine and pelvis   Dr [name]  . History of radiation therapy 11/08/2020-11/18/2020   right arm   Dr [name]  . Hyperlipidemia   . Hypertension   . Lymphedema of right arm   . Neuropathy associated with cancer ([place])   . Obesity (BMI 35.0-39.9 without comorbidity)    Past Surgical History:  Procedure Laterality Date  . CATARACT EXTRACTION Left   . HYSTERECTOMY ABDOMINAL WITH SALPINGO-OOPHORECTOMY    . MODIFIED RADICAL MASTECTOMY Right    Social History   Socioeconomic History  . Marital status: Married    Spouse name: Not on file  . Number of children: Not on file  . Years of education: Not on file  . Highest education level: Not on file  Occupational History  . Not on file  Tobacco Use  . Smoking status: Never Smoker  . Smokeless tobacco: Never Used  Vaping Use  . Vaping Use: Never used  Substance and Sexual Activity  . Alcohol use: Not Currently  . Drug use: Never  . Sexual activity: Not Currently  Other Topics Concern  . Not on file  Social History Narrative  . Not on file   Social  Determinants of Health   Financial Resource Strain: Low Risk   . Difficulty of Paying Living Expenses: Not hard at all  Food Insecurity: No Food Insecurity  . Worried About Charity fundraiser in the Last Year: Never true  . Ran Out of Food in the Last Year: Never true  Transportation Needs: No Transportation Needs  . Lack of Transportation (Medical): No  . Lack of Transportation (Non-Medical): No  Physical Activity: Inactive  . Days of Exercise per Week: 0 days  . Minutes of Exercise per Session: 0 min  Stress: No Stress Concern Present  . Feeling of Stress : Not at all  Social Connections: Moderately Isolated  . Frequency of Communication with Friends and Family: More than three times a week  . Frequency of Social Gatherings with Friends and Family: More than three times a week  . Attends Religious Services: Never  . Active Member of Clubs or Organizations: No  . Attends Archivist Meetings: Never  . Marital Status: Married   Family History  Problem Relation Age of Onset  . Diabetes Mother   . Breast cancer Mother 72  . Cerebral aneurysm Mother   . Pulmonary embolism Father   . Colon cancer Father 41  . Breast cancer Sister 72       noninvasive  . Breast cancer Paternal Grandmother 37   Allergies  Allergen Reactions  .  Morphine And Related Anaphylaxis   Prior to Admission medications   Medication Sig Start Date End Date Taking? Authorizing Provider  amLODipine (NORVASC) 10 MG tablet Take 10 mg by mouth daily. 09/22/20  Yes [provider]  aspirin 81 MG chewable tablet Chew 81 mg by mouth daily.   Yes [provider]  fenofibrate (TRICOR) 145 MG tablet TAKE 1 TABLET BY MOUTH EVERY DAY Patient taking differently: Take 145 mg by mouth daily. 12/21/20  Yes Cirigliano, Mary K, DO  fluticasone (FLONASE) 50 MCG/ACT nasal spray SPRAY 2 SPRAYS INTO EACH NOSTRIL EVERY DAY Patient taking differently: Place 1 spray into both nostrils daily. 10/03/20  Yes  Cirigliano, Mary K, DO  gabapentin (NEURONTIN) 300 MG capsule Take 1 capsule (300 mg total) by mouth 4 (four) times daily. Pt is only taking 2 capsules a day Patient taking differently: Take 300 mg by mouth 2 (two) times daily. 12/28/20  Yes Magrinat, Virgie Dad, MD  ibuprofen (ADVIL) 800 MG tablet Take 800 mg by mouth every 8 (eight) hours as needed for moderate pain. 10/26/20  Yes Magrinat, Virgie Dad, MD  insulin glargine (LANTUS SOLOSTAR) 100 UNIT/ML Solostar Pen INJECT 24 UNITS INTO THE SKIN DAILY Patient taking differently: Inject 24 Units into the skin daily. 10/17/20  Yes Cirigliano, Mary K, DO  losartan (COZAAR) 50 MG tablet Take 1 tablet (50 mg total) by mouth 2 (two) times daily. 12/21/20  Yes Cirigliano, Mary K, DO  metoprolol succinate (TOPROL-XL) 25 MG 24 hr tablet TAKE 1 TABLET BY MOUTH EVERY DAY Patient taking differently: Take 25 mg by mouth daily. 10/17/20  Yes Cirigliano, Mary K, DO  oxyCODONE (OXY IR/ROXICODONE) 5 MG immediate release tablet Take 1-2 tablets (5-10 mg total) by mouth every 4 (four) hours as needed for severe pain. 12/06/20  Yes Magrinat, Virgie Dad, MD  oxyCODONE (OXYCONTIN) 20 mg 12 hr tablet Take 1 tablet (20 mg total) by mouth every 8 (eight) hours. 12/01/20  Yes Magrinat, Virgie Dad, MD  pantoprazole (PROTONIX) 40 MG tablet TAKE 1 TABLET BY MOUTH EVERY DAY Patient taking differently: Take 40 mg by mouth daily. 01/02/21  Yes Cirigliano, Mary K, DO  pioglitazone (ACTOS) 15 MG tablet TAKE 1 TABLET BY MOUTH EVERY DAY Patient taking differently: Take 15 mg by mouth daily. 10/06/20  Yes Cirigliano, Mary K, DO  pravastatin (PRAVACHOL) 20 MG tablet TAKE 1 TABLET BY MOUTH EVERY DAY Patient taking differently: Take 20 mg by mouth daily. 12/21/20  Yes Cirigliano, Garvin Fila, DO  sitaGLIPtin-metformin (JANUMET) 50-1000 MG tablet Take 1 tablet by mouth daily with breakfast. 10/06/20  Yes Cirigliano, Mary K, DO  Wheat Dextrin-Calcium (BENEFIBER PLUS CALCIUM) CHEW Chew 2 tablets by mouth daily.   Yes  [provider]  alpelisib (PIQRAY 300MG DAILY DOSE) 2 x 150 MG Therapy Pack Take 300 mg by mouth daily. Take with food on days 1-28 at the same time daily. Swallow whole, do not crush, chew, or split. 01/06/21   Magrinat, Virgie Dad, MD  COVID-19 mRNA vaccine, Pfizer, 30 MCG/0.3ML injection AS DIRECTED 08/16/20 08/16/21  Carlyle Basques, MD  Insulin Admin Supplies MISC Inject 24 Units into the skin daily.    [provider]  Insulin Pen Needle 31G X 5 MM MISC UAD for Sq inj for DM qd 01/13/20   Libby Maw, MD  prochlorperazine (COMPAZINE) 10 MG tablet Take 1 tablet (10 mg total) by mouth every 6 (six) hours as needed (Nausea or vomiting). Patient not taking: No sig reported 10/26/20  01/06/21  Magrinat, Virgie Dad, MD   MR HUMERUS RIGHT W WO CONTRAST  Result Date: 01/07/2021 CLINICAL DATA:  Pathologic proximal humerus fracture. History of metastatic breast cancer. EXAM: MRI OF THE RIGHT SHOULDER WITHOUT AND WITH CONTRAST; MRI OF THE RIGHT HUMERUS WITHOUT AND WITH CONTRAST TECHNIQUE: Multiplanar, multisequence MR imaging of the right shoulder and humerus was performed before and after the administration of intravenous contrast. CONTRAST:  41mL GADAVIST GADOBUTROL 1 MMOL/ML IV SOLN COMPARISON:  Right humerus x-rays from yesterday. Right shoulder x-rays dated October 24, 2020. FINDINGS: Rotator cuff:  Intact rotator cuff.  Moderate tendinosis. Muscles: Diffuse muscle edema about the right shoulder, presumably related to prior radiation. No significant atrophy. Biceps long head:  Intact and normally positioned. Acromioclavicular Joint: Moderate arthropathy of the acromioclavicular joint. Type I acromion. No subacromial/subdeltoid bursal fluid. Glenohumeral Joint: Small joint effusion.  No chondral defect. Labrum:  Intact. Bones: Large marrow replacing lesion involving the majority of the humeral head and proximal metadiaphysis, spanning a length of approximately 8.5 cm, best appreciated on  the sagittal T1 images. There is a subacute mildly displaced pathologic fracture of the proximal metadiaphysis with 5 mm anterior displacement. There is a small amount of subperiosteal fluid. No additional metastatic lesions involving the right upper arm. Incompletely evaluated metastases involving multiple thoracic vertebral bodies. Other: Scattered soft tissue swelling of the right upper arm. Partially visualized left pleural effusion. IMPRESSION: 1. Large osseous metastasis involving the majority of the humeral head and proximal metadiaphysis, spanning a length of approximately 8.5 cm. Associated subacute mildly displaced pathologic fracture of the proximal metadiaphysis. 2. No additional metastatic lesions involving the right upper arm. 3. Incompletely evaluated thoracic vertebral metastatic disease. 4. Intact rotator cuff with moderate tendinosis. Electronically Signed   By: Titus Dubin M.D.   On: 01/07/2021 13:18   MR SHOULDER RIGHT W WO CONTRAST  Result Date: 01/07/2021 CLINICAL DATA:  Pathologic proximal humerus fracture. History of metastatic breast cancer. EXAM: MRI OF THE RIGHT SHOULDER WITHOUT AND WITH CONTRAST; MRI OF THE RIGHT HUMERUS WITHOUT AND WITH CONTRAST TECHNIQUE: Multiplanar, multisequence MR imaging of the right shoulder and humerus was performed before and after the administration of intravenous contrast. CONTRAST:  40mL GADAVIST GADOBUTROL 1 MMOL/ML IV SOLN COMPARISON:  Right humerus x-rays from yesterday. Right shoulder x-rays dated October 24, 2020. FINDINGS: Rotator cuff:  Intact rotator cuff.  Moderate tendinosis. Muscles: Diffuse muscle edema about the right shoulder, presumably related to prior radiation. No significant atrophy. Biceps long head:  Intact and normally positioned. Acromioclavicular Joint: Moderate arthropathy of the acromioclavicular joint. Type I acromion. No subacromial/subdeltoid bursal fluid. Glenohumeral Joint: Small joint effusion.  No chondral defect.  Labrum:  Intact. Bones: Large marrow replacing lesion involving the majority of the humeral head and proximal metadiaphysis, spanning a length of approximately 8.5 cm, best appreciated on the sagittal T1 images. There is a subacute mildly displaced pathologic fracture of the proximal metadiaphysis with 5 mm anterior displacement. There is a small amount of subperiosteal fluid. No additional metastatic lesions involving the right upper arm. Incompletely evaluated metastases involving multiple thoracic vertebral bodies. Other: Scattered soft tissue swelling of the right upper arm. Partially visualized left pleural effusion. IMPRESSION: 1. Large osseous metastasis involving the majority of the humeral head and proximal metadiaphysis, spanning a length of approximately 8.5 cm. Associated subacute mildly displaced pathologic fracture of the proximal metadiaphysis. 2. No additional metastatic lesions involving the right upper arm. 3. Incompletely evaluated thoracic vertebral metastatic disease. 4. Intact rotator cuff with  moderate tendinosis. Electronically Signed   By: Titus Dubin M.D.   On: 01/07/2021 13:18   Family History Reviewed and non-contributory, no pertinent history of problems with bleeding or anesthesia      Review of Systems 14 system ROS conducted and negative except for that noted in HPI   OBJECTIVE  Vitals: Patient Vitals for the past 8 hrs:  BP Temp Temp src Pulse Resp SpO2  01/08/21 1410 (!) 96/54 98.2 F (36.8 C) Oral (!) 102 16 98 %   General: Alert, no acute distress Cardiovascular: Warm extremities noted Respiratory: No cyanosis, no use of accessory musculature GI: No organomegaly, abdomen is soft and non-tender Skin: No lesions in the area of chief complaint other than those listed below in MSK exam.  Neurologic: Sensation intact distally save for the below mentioned MSK exam Psychiatric: Patient is competent for consent with normal mood and affect Lymphatic: No  swelling obvious and reported other than the area involved in the exam below Extremities  Right upper extremity: Limited function with axillary nerve sensation appears to be intact is difficult to check motor due to fracture.  Distal motor and sensory functions intact, well perfused hand.  Median, ulnar, radial function all preserved.  Test Results Imaging X-rays and MRI demonstrated pathologic proximal humerus fracture with metastases extending about half the length of the humeral diaphysis.  Cuff appears to be intact.  Labs cbc Recent Labs    01/07/21 0441 01/08/21 0557  WBC 1.5* 2.3*  HGB 6.5* 8.4*  HCT 21.2* 26.5*  PLT 577* 654*    Labs inflam No results for input(s): CRP in the last 72 hours.  Invalid input(s): ESR  Labs coag No results for input(s): INR, PTT in the last 72 hours.  Invalid input(s): PT  Recent Labs    01/07/21 0441 01/08/21 0557  NA 134* 138  K 5.0 5.1  CL 98 102  CO2 29 25  GLUCOSE 98 98  BUN 21 18  CREATININE 1.21* 1.00  CALCIUM 8.5* 8.4*     ASSESSMENT AND PLAN: 65 y.o. female with the following: Pathologic right humerus fracture with nonunion  The patient has a complex issue and is gone on to nonunion at this point.  Due to her radiated skin we feel that limiting her exposure and dissection will be prudent.  She is extraordinarily high risk for nonunion, infection and failure of hardware.  We discussed an intramedullary nail providing her instrumentation of the entirety of the humerus while still fixing her fracture.  We will limit her dissection.  Specific risks including infection, continued nonunion, neurovascular injury and need for arthroplasty were all discussed.  Due to operating room availability we will not be able to perform the surgery until April 13.  We will plan for an intramedullary nail at that point.

## 2021-01-08 NOTE — Consult Note (Signed)
ORTHOPAEDIC CONSULTATION  REQUESTING PHYSICIAN: Flora Lipps, MD  Chief Complaint: Proximal humeral nonunion  HPI: Mia [name] is a 65 y.o. female with metastatic breast cancer and injury in January with a diagnosis of proximal humerus fracture.  At this point she is gone on the proximal humeral nonunion and I was asked to weigh in to consider fixation versus arthroplasty options.  Unfortunately patient is very debilitated by her injury.  She is admitted for hypoxic respiratory failure.  She is currently comfortable on nasal cannula in her bed and we discussed the case with the patient as well as her family.  She still has significant pain and dysfunction her right arm.  Past Medical History:  Diagnosis Date  . Breast cancer metastasized to bone ([place])   . Diabetes mellitus without complication ([place])   . History of radiation therapy 11/18/2018-12/02/2018   lumbar spine and pelvis   Dr [name]  . History of radiation therapy 11/08/2020-11/18/2020   right arm   Dr [name]  . Hyperlipidemia   . Hypertension   . Lymphedema of right arm   . Neuropathy associated with cancer ([place])   . Obesity (BMI 35.0-39.9 without comorbidity)    Past Surgical History:  Procedure Laterality Date  . CATARACT EXTRACTION Left   . HYSTERECTOMY ABDOMINAL WITH SALPINGO-OOPHORECTOMY    . MODIFIED RADICAL MASTECTOMY Right    Social History   Socioeconomic History  . Marital status: Married    Spouse name: Not on file  . Number of children: Not on file  . Years of education: Not on file  . Highest education level: Not on file  Occupational History  . Not on file  Tobacco Use  . Smoking status: Never Smoker  . Smokeless tobacco: Never Used  Vaping Use  . Vaping Use: Never used  Substance and Sexual Activity  . Alcohol use: Not Currently  . Drug use: Never  . Sexual activity: Not Currently  Other Topics Concern  . Not on file  Social History Narrative  . Not on file   Social  Determinants of Health   Financial Resource Strain: Low Risk   . Difficulty of Paying Living Expenses: Not hard at all  Food Insecurity: No Food Insecurity  . Worried About Charity fundraiser in the Last Year: Never true  . Ran Out of Food in the Last Year: Never true  Transportation Needs: No Transportation Needs  . Lack of Transportation (Medical): No  . Lack of Transportation (Non-Medical): No  Physical Activity: Inactive  . Days of Exercise per Week: 0 days  . Minutes of Exercise per Session: 0 min  Stress: No Stress Concern Present  . Feeling of Stress : Not at all  Social Connections: Moderately Isolated  . Frequency of Communication with Friends and Family: More than three times a week  . Frequency of Social Gatherings with Friends and Family: More than three times a week  . Attends Religious Services: Never  . Active Member of Clubs or Organizations: No  . Attends Archivist Meetings: Never  . Marital Status: Married   Family History  Problem Relation Age of Onset  . Diabetes Mother   . Breast cancer Mother 72  . Cerebral aneurysm Mother   . Pulmonary embolism Father   . Colon cancer Father 41  . Breast cancer Sister 72       noninvasive  . Breast cancer Paternal Grandmother 37   Allergies  Allergen Reactions  .  Morphine And Related Anaphylaxis   Prior to Admission medications   Medication Sig Start Date End Date Taking? Authorizing Provider  amLODipine (NORVASC) 10 MG tablet Take 10 mg by mouth daily. 09/22/20  Yes [provider]  aspirin 81 MG chewable tablet Chew 81 mg by mouth daily.   Yes [provider]  fenofibrate (TRICOR) 145 MG tablet TAKE 1 TABLET BY MOUTH EVERY DAY Patient taking differently: Take 145 mg by mouth daily. 12/21/20  Yes Cirigliano, Mary K, DO  fluticasone (FLONASE) 50 MCG/ACT nasal spray SPRAY 2 SPRAYS INTO EACH NOSTRIL EVERY DAY Patient taking differently: Place 1 spray into both nostrils daily. 10/03/20  Yes  Cirigliano, Mary K, DO  gabapentin (NEURONTIN) 300 MG capsule Take 1 capsule (300 mg total) by mouth 4 (four) times daily. Pt is only taking 2 capsules a day Patient taking differently: Take 300 mg by mouth 2 (two) times daily. 12/28/20  Yes Magrinat, Virgie Dad, MD  ibuprofen (ADVIL) 800 MG tablet Take 800 mg by mouth every 8 (eight) hours as needed for moderate pain. 10/26/20  Yes Magrinat, Virgie Dad, MD  insulin glargine (LANTUS SOLOSTAR) 100 UNIT/ML Solostar Pen INJECT 24 UNITS INTO THE SKIN DAILY Patient taking differently: Inject 24 Units into the skin daily. 10/17/20  Yes Cirigliano, Mary K, DO  losartan (COZAAR) 50 MG tablet Take 1 tablet (50 mg total) by mouth 2 (two) times daily. 12/21/20  Yes Cirigliano, Mary K, DO  metoprolol succinate (TOPROL-XL) 25 MG 24 hr tablet TAKE 1 TABLET BY MOUTH EVERY DAY Patient taking differently: Take 25 mg by mouth daily. 10/17/20  Yes Cirigliano, Mary K, DO  oxyCODONE (OXY IR/ROXICODONE) 5 MG immediate release tablet Take 1-2 tablets (5-10 mg total) by mouth every 4 (four) hours as needed for severe pain. 12/06/20  Yes Magrinat, Virgie Dad, MD  oxyCODONE (OXYCONTIN) 20 mg 12 hr tablet Take 1 tablet (20 mg total) by mouth every 8 (eight) hours. 12/01/20  Yes Magrinat, Virgie Dad, MD  pantoprazole (PROTONIX) 40 MG tablet TAKE 1 TABLET BY MOUTH EVERY DAY Patient taking differently: Take 40 mg by mouth daily. 01/02/21  Yes Cirigliano, Mary K, DO  pioglitazone (ACTOS) 15 MG tablet TAKE 1 TABLET BY MOUTH EVERY DAY Patient taking differently: Take 15 mg by mouth daily. 10/06/20  Yes Cirigliano, Mary K, DO  pravastatin (PRAVACHOL) 20 MG tablet TAKE 1 TABLET BY MOUTH EVERY DAY Patient taking differently: Take 20 mg by mouth daily. 12/21/20  Yes Cirigliano, Garvin Fila, DO  sitaGLIPtin-metformin (JANUMET) 50-1000 MG tablet Take 1 tablet by mouth daily with breakfast. 10/06/20  Yes Cirigliano, Mary K, DO  Wheat Dextrin-Calcium (BENEFIBER PLUS CALCIUM) CHEW Chew 2 tablets by mouth daily.   Yes  [provider]  alpelisib (PIQRAY 300MG DAILY DOSE) 2 x 150 MG Therapy Pack Take 300 mg by mouth daily. Take with food on days 1-28 at the same time daily. Swallow whole, do not crush, chew, or split. 01/06/21   Magrinat, Virgie Dad, MD  COVID-19 mRNA vaccine, Pfizer, 30 MCG/0.3ML injection AS DIRECTED 08/16/20 08/16/21  Carlyle Basques, MD  Insulin Admin Supplies MISC Inject 24 Units into the skin daily.    [provider]  Insulin Pen Needle 31G X 5 MM MISC UAD for Sq inj for DM qd 01/13/20   Libby Maw, MD  prochlorperazine (COMPAZINE) 10 MG tablet Take 1 tablet (10 mg total) by mouth every 6 (six) hours as needed (Nausea or vomiting). Patient not taking: No sig reported 10/26/20  01/06/21  Magrinat, Virgie Dad, MD   MR HUMERUS RIGHT W WO CONTRAST  Result Date: 01/07/2021 CLINICAL DATA:  Pathologic proximal humerus fracture. History of metastatic breast cancer. EXAM: MRI OF THE RIGHT SHOULDER WITHOUT AND WITH CONTRAST; MRI OF THE RIGHT HUMERUS WITHOUT AND WITH CONTRAST TECHNIQUE: Multiplanar, multisequence MR imaging of the right shoulder and humerus was performed before and after the administration of intravenous contrast. CONTRAST:  18mL GADAVIST GADOBUTROL 1 MMOL/ML IV SOLN COMPARISON:  Right humerus x-rays from yesterday. Right shoulder x-rays dated October 24, 2020. FINDINGS: Rotator cuff:  Intact rotator cuff.  Moderate tendinosis. Muscles: Diffuse muscle edema about the right shoulder, presumably related to prior radiation. No significant atrophy. Biceps long head:  Intact and normally positioned. Acromioclavicular Joint: Moderate arthropathy of the acromioclavicular joint. Type I acromion. No subacromial/subdeltoid bursal fluid. Glenohumeral Joint: Small joint effusion.  No chondral defect. Labrum:  Intact. Bones: Large marrow replacing lesion involving the majority of the humeral head and proximal metadiaphysis, spanning a length of approximately 8.5 cm, best appreciated on  the sagittal T1 images. There is a subacute mildly displaced pathologic fracture of the proximal metadiaphysis with 5 mm anterior displacement. There is a small amount of subperiosteal fluid. No additional metastatic lesions involving the right upper arm. Incompletely evaluated metastases involving multiple thoracic vertebral bodies. Other: Scattered soft tissue swelling of the right upper arm. Partially visualized left pleural effusion. IMPRESSION: 1. Large osseous metastasis involving the majority of the humeral head and proximal metadiaphysis, spanning a length of approximately 8.5 cm. Associated subacute mildly displaced pathologic fracture of the proximal metadiaphysis. 2. No additional metastatic lesions involving the right upper arm. 3. Incompletely evaluated thoracic vertebral metastatic disease. 4. Intact rotator cuff with moderate tendinosis. Electronically Signed   By: Titus Dubin M.D.   On: 01/07/2021 13:18   MR SHOULDER RIGHT W WO CONTRAST  Result Date: 01/07/2021 CLINICAL DATA:  Pathologic proximal humerus fracture. History of metastatic breast cancer. EXAM: MRI OF THE RIGHT SHOULDER WITHOUT AND WITH CONTRAST; MRI OF THE RIGHT HUMERUS WITHOUT AND WITH CONTRAST TECHNIQUE: Multiplanar, multisequence MR imaging of the right shoulder and humerus was performed before and after the administration of intravenous contrast. CONTRAST:  19mL GADAVIST GADOBUTROL 1 MMOL/ML IV SOLN COMPARISON:  Right humerus x-rays from yesterday. Right shoulder x-rays dated October 24, 2020. FINDINGS: Rotator cuff:  Intact rotator cuff.  Moderate tendinosis. Muscles: Diffuse muscle edema about the right shoulder, presumably related to prior radiation. No significant atrophy. Biceps long head:  Intact and normally positioned. Acromioclavicular Joint: Moderate arthropathy of the acromioclavicular joint. Type I acromion. No subacromial/subdeltoid bursal fluid. Glenohumeral Joint: Small joint effusion.  No chondral defect.  Labrum:  Intact. Bones: Large marrow replacing lesion involving the majority of the humeral head and proximal metadiaphysis, spanning a length of approximately 8.5 cm, best appreciated on the sagittal T1 images. There is a subacute mildly displaced pathologic fracture of the proximal metadiaphysis with 5 mm anterior displacement. There is a small amount of subperiosteal fluid. No additional metastatic lesions involving the right upper arm. Incompletely evaluated metastases involving multiple thoracic vertebral bodies. Other: Scattered soft tissue swelling of the right upper arm. Partially visualized left pleural effusion. IMPRESSION: 1. Large osseous metastasis involving the majority of the humeral head and proximal metadiaphysis, spanning a length of approximately 8.5 cm. Associated subacute mildly displaced pathologic fracture of the proximal metadiaphysis. 2. No additional metastatic lesions involving the right upper arm. 3. Incompletely evaluated thoracic vertebral metastatic disease. 4. Intact rotator cuff with  moderate tendinosis. Electronically Signed   By: Titus Dubin M.D.   On: 01/07/2021 13:18   Family History Reviewed and non-contributory, no pertinent history of problems with bleeding or anesthesia      Review of Systems 14 system ROS conducted and negative except for that noted in HPI   OBJECTIVE  Vitals: Patient Vitals for the past 8 hrs:  BP Temp Temp src Pulse Resp SpO2  01/08/21 1410 (!) 96/54 98.2 F (36.8 C) Oral (!) 102 16 98 %   General: Alert, no acute distress Cardiovascular: Warm extremities noted Respiratory: No cyanosis, no use of accessory musculature GI: No organomegaly, abdomen is soft and non-tender Skin: No lesions in the area of chief complaint other than those listed below in MSK exam.  Neurologic: Sensation intact distally save for the below mentioned MSK exam Psychiatric: Patient is competent for consent with normal mood and affect Lymphatic: No  swelling obvious and reported other than the area involved in the exam below Extremities  Right upper extremity: Limited function with axillary nerve sensation appears to be intact is difficult to check motor due to fracture.  Distal motor and sensory functions intact, well perfused hand.  Median, ulnar, radial function all preserved.  Test Results Imaging X-rays and MRI demonstrated pathologic proximal humerus fracture with metastases extending about half the length of the humeral diaphysis.  Cuff appears to be intact.  Labs cbc Recent Labs    01/07/21 0441 01/08/21 0557  WBC 1.5* 2.3*  HGB 6.5* 8.4*  HCT 21.2* 26.5*  PLT 577* 654*    Labs inflam No results for input(s): CRP in the last 72 hours.  Invalid input(s): ESR  Labs coag No results for input(s): INR, PTT in the last 72 hours.  Invalid input(s): PT  Recent Labs    01/07/21 0441 01/08/21 0557  NA 134* 138  K 5.0 5.1  CL 98 102  CO2 29 25  GLUCOSE 98 98  BUN 21 18  CREATININE 1.21* 1.00  CALCIUM 8.5* 8.4*     ASSESSMENT AND PLAN: 65 y.o. female with the following: Pathologic right humerus fracture with nonunion  The patient has a complex issue and is gone on to nonunion at this point.  Due to her radiated skin we feel that limiting her exposure and dissection will be prudent.  She is extraordinarily high risk for nonunion, infection and failure of hardware.  We discussed an intramedullary nail providing her instrumentation of the entirety of the humerus while still fixing her fracture.  We will limit her dissection.  Specific risks including infection, continued nonunion, neurovascular injury and need for arthroplasty were all discussed.  Due to operating room availability we will not be able to perform the surgery until April 13.  We will plan for an intramedullary nail at that point.

## 2021-01-08 NOTE — Progress Notes (Addendum)
Unfortunately due to OR availability will not be able to perform humeral nail until Wednesday. Please keep npo for surgery April 13th. I discussed this with the patient.    I will post for intramedullary nail for Wednesday April 13.

## 2021-01-08 NOTE — Progress Notes (Signed)
PROGRESS NOTE  Nija Koopman YHC:[id number] DOB: 1955/12/10 DOA: 01/05/2021 PCP: Ronnald Nian, DO   LOS: 3 days   Brief narrative:   Brittnae Aschenbrenner is a 65 y.o. female with medical history significant for stage IV metastatic breast cancer to lung and bones s/p right mastectomy, radiation on chemotherapy, history of malignant pleural effusion requiring thoracentesis, essential hypertension, insulin-dependent type 2 diabetes presented to hospital with difficulty urinating, shortness of breath and worsening tremors.  Patient does have chronic shortness of breath and tremors but had recently been worsening.   She had thoracentesis on 11/21/2020 with 1.5 L malignant effusion removed. She also notes chronic right-sided shoulder pain that started in January 2022 and has progressively gotten to the point where she can no longer lift her arm.  She was supposed to get an MRI to follow-up yesterday.   In the ED, patient was afebrile, borderline hypotensive, and had oxygen desaturation down to 78% requiring 2 L of O2.  Labs notable for significant neutropenia with WBC of 1, absolute neutrophils 0.3.  Hemoglobin 10.4, HCT of 33.7, Na of 128, Creatinine of 2.03 from 1.24.  Chest x-ray showed complete approximation of the left hemothorax. Post-void residual was zero and pt ultimately urinated in ED.  Patient was then admitted to hospital for further evaluation and treatment.  Assessment/Plan:  Principal Problem:   Acute respiratory failure with hypoxia (HCC) Active Problems:   Metastatic breast cancer ([place])   Bone metastases (HCC)   Lung metastases ([place])   Peripheral neuropathy due to chemotherapy ([place])   Diabetes mellitus type 2 in obese (HCC)   Pleural effusion   Hypotension   AKI (acute kidney injury) ([place])   Hyponatremia   Neutropenia (HCC)   Shoulder pain, right  Acute hypoxic respiratory failure secondary to malignant left pleural effusion Improved after thoracocentesis.  Chest  x-ray in the ED showed large left pleural effusion.  Patient was unable to get CT scan due to worsening renal insufficiency.  Patient did have thoracocentesis with 1.5 L of fluid removal.  Feels much improved.  Ultrasound-guided thoracocentesis has been requested with fluid study.  Cytology pending.  Currently on 2 L of oxygen by nasal cannula saturating around 98%.  We will continue to monitor while in hospital.  Neutropenia Has improved.  WBC of 2.3 hemoglobin 8.4 and platelet of 654 at this time.    On empiric antibiotic with  cefepime.  Procalcitonin < 0.10.  MRSA PCR was negative.  Urine culture with insignificant colonies, blood cultures negative in 1 day.  Fluid culture negative so far.  Off vancomycin.  Patient is afebrile.  Anemia likely secondary to malignancy.  Status post 1 unit of packed RBC.  Hemoglobin has improved.   Hypotension Blood pressure 123/60.  Patient is on amlodipine losartan and metoprolol at home.  Antihypertensives on hold at this time.  Hyponatremia We will continue to monitor.  Sodium of 138 today.  Acute kidney injury. Improved after IV fluids.  Creatinine of 1.0.  History of stage IV metastatic breast cancer to lungs and bones Follows with oncologist Dr. [name].  Oncology has seen the patient, will follow up as outpatient.  Chemo-induced peripheral neuropathy Continue gabapentin  Type 2 diabetes Continue to hold Actos and Janumet.  Continue moderate sliding scale insulin, this, Accu-Cheks for now.    Hyperlipidemia Continue statin, fenofibrate  Right shoulder pain/pathologic fx of proximal right humerus with angulation  Has known metastatic disease to peri-glenoid region of right scapula back in 2019.  Now noted to have new humerus fracture.  MRI of the right shoulder showed around 8 cm metastatic lesion over the proximal humerus with pathological fracture.  Spoke with Dr. Griffin Basil, plan for IM nailing tomorrow.    DVT prophylaxis: SCDs Start:  01/05/21 2046   Code Status: Full code  Family Communication:  I again spoke with the patient's husband at bedside updated him about the clinical condition of the patient.   Status is: Inpatient  Remains inpatient appropriate because:IV treatments appropriate due to intensity of illness or inability to take PO and Inpatient level of care appropriate due to severity of illness, IV narcotics for pain relief, plan for IM nailing of the right humerus   Dispo: The patient is from: Home              Anticipated d/c is to: Home in 2 to 3 days               Patient currently is not medically stable to d/c.   Difficult to place patient No  Consultants:  Oncology  Orthopedics  Procedures:  Status post thoracocentesis on 01/06/2021.    Anti-infectives:  . Cefepime 4/7>  Anti-infectives (From admission, onward)   Start     Dose/Rate Route Frequency Ordered Stop   01/06/21 1400  ceFEPIme (MAXIPIME) 2 g in sodium chloride 0.9 % 100 mL IVPB        2 g 200 mL/hr over 30 Minutes Intravenous Every 12 hours 01/06/21 0807     01/05/21 2100  vancomycin (VANCOREADY) IVPB 1500 mg/300 mL        1,500 mg 150 mL/hr over 120 Minutes Intravenous  Once 01/05/21 2055 01/06/21 0305   01/05/21 2100  ceFEPIme (MAXIPIME) 2 g in sodium chloride 0.9 % 100 mL IVPB        2 g 200 mL/hr over 30 Minutes Intravenous  Once 01/05/21 2055 01/06/21 0355     Subjective: Today, patient was seen and examined at bedside.  Complains of right arm pain little better on sling.  No nausea vomiting or increasing shortness of breath. Objective: Vitals:   01/08/21 0000 01/08/21 0616  BP:  123/60  Pulse:  99  Resp: 15 20  Temp:  98.4 F (36.9 C)  SpO2:  98%    Intake/Output Summary (Last 24 hours) at 01/08/2021 1004 Last data filed at 01/08/2021 0933 Gross per 24 hour  Intake 300 ml  Output 1675 ml  Net -1375 ml   Filed Weights   01/05/21 1508  Weight: 99.3 kg   Body mass index is 37.59 kg/m.   Physical  Exam: GENERAL: Obese, patient is alert awake and oriented. Not in obvious distress.  On 2 L of oxygen by nasal cannula HENT: Pallor noted.  Pupils equally reactive to light. Oral mucosa is moist. NECK: is supple, no gross swelling noted. CHEST: Diminished breath sounds bilaterally.  Aeration improved on the left side. CVS: S1 and S2 heard, no murmur. Regular rate and rhythm.  ABDOMEN: Soft, non-tender, bowel sounds are present. EXTREMITIES: Bilateral lower extremity trace pitting edema noted.  Right upper extremity tenderness on palpation on a sling.. CNS: Cranial nerves are intact. No focal motor deficits. SKIN: warm and dry without rashes.  Data Review: I have personally reviewed the following laboratory data and studies,  CBC: Recent Labs  Lab 01/05/21 1911 01/06/21 0134 01/07/21 0441 01/08/21 0557  WBC 1.0* 1.4* 1.5* 2.3*  NEUTROABS 0.3*  --  0.2* 0.1*  HGB 10.4* 7.9* 6.5*  8.4*  HCT 33.7* 25.8* 21.2* 26.5*  MCV 100.0 101.6* 100.5* 96.0  PLT 418* 456* 577* 157*   Basic Metabolic Panel: Recent Labs  Lab 01/05/21 1911 01/06/21 0134 01/07/21 0441 01/08/21 0557  NA 128* 129* 134* 138  K 5.0 4.8 5.0 5.1  CL 92* 94* 98 102  CO2 28 26 29 25   GLUCOSE 107* 117* 98 98  BUN 33* 31* 21 18  CREATININE 2.03* 1.72* 1.21* 1.00  CALCIUM 8.8* 8.9 8.5* 8.4*  MG  --   --  1.8 1.7  PHOS  --   --  2.4* 3.5   Liver Function Tests: Recent Labs  Lab 01/05/21 1911 01/08/21 0557  AST 17 24  ALT 10 9  ALKPHOS 103 83  BILITOT 0.5 0.9  PROT 6.0* 5.2*  ALBUMIN 2.7* 2.2*   No results for input(s): LIPASE, AMYLASE in the last 168 hours. No results for input(s): AMMONIA in the last 168 hours. Cardiac Enzymes: No results for input(s): CKTOTAL, CKMB, CKMBINDEX, TROPONINI in the last 168 hours. BNP (last 3 results) No results for input(s): BNP in the last 8760 hours.  ProBNP (last 3 results) No results for input(s): PROBNP in the last 8760 hours.  CBG: Recent Labs  Lab  01/06/21 1935 01/07/21 0725 01/07/21 1127 01/07/21 2234 01/08/21 0745  GLUCAP 135* 94 112* 148* 105*   Recent Results (from the past 240 hour(s))  SARS CORONAVIRUS 2 (TAT 6-24 HRS) Nasopharyngeal Nasopharyngeal Swab     Status: None   Collection Time: 01/03/21 12:53 PM   Specimen: Nasopharyngeal Swab  Result Value Ref Range Status   SARS Coronavirus 2 NEGATIVE NEGATIVE Final    Comment: (NOTE) SARS-CoV-2 target nucleic acids are NOT DETECTED.  The SARS-CoV-2 RNA is generally detectable in upper and lower respiratory specimens during the acute phase of infection. Negative results do not preclude SARS-CoV-2 infection, do not rule out co-infections with other pathogens, and should not be used as the sole basis for treatment or other patient management decisions. Negative results must be combined with clinical observations, patient history, and epidemiological information. The expected result is Negative.  Fact Sheet for Patients: SugarRoll.be  Fact Sheet for Healthcare Providers: https://www.woods-mathews.com/  This test is not yet approved or cleared by the [place] FDA and  has been authorized for detection and/or diagnosis of SARS-CoV-2 by FDA under an Emergency Use Authorization (EUA). This EUA will remain  in effect (meaning this test can be used) for the duration of the COVID-19 declaration under Se ction 564(b)(1) of the Act, 21 U.S.C. section 360bbb-3(b)(1), unless the authorization is terminated or revoked sooner.  Performed at [facility], [place] [address] [place], [place] [address]   Resp Panel by RT-PCR (Flu A&B, Covid) Nasopharyngeal Swab     Status: None   Collection Time: 01/05/21  4:20 PM   Specimen: Nasopharyngeal Swab; Nasopharyngeal(NP) swabs in vial transport medium  Result Value Ref Range Status   SARS Coronavirus 2 by RT PCR NEGATIVE NEGATIVE Final    Comment: (NOTE) SARS-CoV-2 target nucleic acids  are NOT DETECTED.  The SARS-CoV-2 RNA is generally detectable in upper respiratory specimens during the acute phase of infection. The lowest concentration of SARS-CoV-2 viral copies this assay can detect is 138 copies/mL. A negative result does not preclude SARS-Cov-2 infection and should not be used as the sole basis for treatment or other patient management decisions. A negative result may occur with  improper specimen collection/handling, submission of specimen other than nasopharyngeal swab,  presence of viral mutation(s) within the areas targeted by this assay, and inadequate number of viral copies(<138 copies/mL). A negative result must be combined with clinical observations, patient history, and epidemiological information. The expected result is Negative.  Fact Sheet for Patients:  EntrepreneurPulse.com.au  Fact Sheet for Healthcare Providers:  IncredibleEmployment.be  This test is no t yet approved or cleared by the [place] FDA and  has been authorized for detection and/or diagnosis of SARS-CoV-2 by FDA under an Emergency Use Authorization (EUA). This EUA will remain  in effect (meaning this test can be used) for the duration of the COVID-19 declaration under Section 564(b)(1) of the Act, 21 U.S.C.section 360bbb-3(b)(1), unless the authorization is terminated  or revoked sooner.       Influenza A by PCR NEGATIVE NEGATIVE Final   Influenza B by PCR NEGATIVE NEGATIVE Final    Comment: (NOTE) The Xpert Xpress SARS-CoV-2/FLU/RSV plus assay is intended as an aid in the diagnosis of influenza from Nasopharyngeal swab specimens and should not be used as a sole basis for treatment. Nasal washings and aspirates are unacceptable for Xpert Xpress SARS-CoV-2/FLU/RSV testing.  Fact Sheet for Patients: EntrepreneurPulse.com.au  Fact Sheet for Healthcare Providers: IncredibleEmployment.be  This test is  not yet approved or cleared by the [place] FDA and has been authorized for detection and/or diagnosis of SARS-CoV-2 by FDA under an Emergency Use Authorization (EUA). This EUA will remain in effect (meaning this test can be used) for the duration of the COVID-19 declaration under Section 564(b)(1) of the Act, 21 U.S.C. section 360bbb-3(b)(1), unless the authorization is terminated or revoked.  Performed at [facility], [place] [address] [place], [place] [address]   Urine culture     Status: Abnormal (Preliminary result)   Collection Time: 01/05/21 10:00 PM   Specimen: Urine, Random  Result Value Ref Range Status   Specimen Description   Final    URINE, RANDOM Performed at [facility], [place] [address], [place], [place] [address]    Special Requests   Final    NONE Performed at [facility], [place] [address], [place], [place] [address]    Culture (A)  Final    20,000 COLONIES/mL ESCHERICHIA COLI SUSCEPTIBILITIES TO FOLLOW Performed at [facility], [place] [address] [place], [place] [address]    Report Status PENDING  Incomplete  Culture, blood (routine x 2)     Status: None (Preliminary result)   Collection Time: 01/06/21  1:34 AM   Specimen: BLOOD  Result Value Ref Range Status   Specimen Description   Final    BLOOD BLOOD LEFT HAND Performed at [place] [address] [place], [place] [address]    Special Requests   Final    BOTTLES DRAWN AEROBIC ONLY Blood Culture adequate volume Performed at [place] [address], [place], [place] [address]    Culture   Final    NO GROWTH 1 DAY Performed at [facility], [place] [address], [place], [place] [address]    Report Status PENDING  Incomplete  Culture, blood (routine x 2)     Status: None (Preliminary result)   Collection Time: 01/06/21  1:34 AM   Specimen: BLOOD  Result Value Ref Range Status   Specimen  Description   Final    BLOOD BLOOD LEFT WRIST Performed at [place] [address], [place], [place] [address]    Special Requests  Final    BOTTLES DRAWN AEROBIC ONLY Blood Culture adequate volume Performed at [place] [address], [place], [place] [address]    Culture   Final    NO GROWTH 1 DAY Performed at [facility], [place] [address], [place], [place] [address]    Report Status PENDING  Incomplete  MRSA PCR Screening     Status: None   Collection Time: 01/06/21  6:22 AM   Specimen: Nasal Mucosa; Nasopharyngeal  Result Value Ref Range Status   MRSA by PCR NEGATIVE NEGATIVE Final    Comment:        The GeneXpert MRSA Assay (FDA approved for NASAL specimens only), is one component of a comprehensive MRSA colonization surveillance program. It is not intended to diagnose MRSA infection nor to guide or monitor treatment for MRSA infections. Performed at [facility], [place] [address], [place], [place] [address]   Body fluid culture w Gram Stain     Status: None (Preliminary result)   Collection Time: 01/06/21  1:03 PM   Specimen: PATH Cytology Pleural fluid  Result Value Ref Range Status   Specimen Description   Final    PLEURAL Performed at [place] [address], [place], [place] [address]    Special Requests   Final    NONE Performed at [facility], [place] [address], [place], [place] [address]    Gram Stain   Final    NO WBC SEEN NO ORGANISMS SEEN Performed at [facility], [place] [address], [place], [place] [address]    Culture PENDING  Incomplete   Report Status PENDING  Incomplete     Studies: DG Chest 1 View  Result Date: 01/06/2021 CLINICAL DATA:  Post LEFT thoracentesis removal of 1.2 L by report. EXAM: CHEST  1 VIEW COMPARISON:  CT chest from January 2022 and chest x-ray from January 05, 2021. FINDINGS: Cardiomediastinal contours largely  obscured by persistent opacification of LEFT hemithorax. Trachea midline. Crescentic graded opacity along the LEFT hemithorax suggest residual pleural fluid despite removal of 1.2 L. RIGHT lung is clear. No sign of pneumothorax on the LEFT following thoracentesis. Suspected pathologic fracture of the RIGHT proximal humerus is not visible on the current study. Changes of RIGHT axillary dissection. EKG leads project over the chest. Calcification along the tract of previous catheter projects over the LEFT neck. IMPRESSION: 1. No sign of pneumothorax on the LEFT following LEFT thoracentesis with improved aeration but with persistent opacification of LEFT hemithorax with residual airspace disease and moderately large pleural effusion. 2. Suspected pathologic fracture RIGHT proximal humerus not visible on the current study. Electronically Signed   By: Zetta Bills M.D.   On: 01/06/2021 13:38   MR HUMERUS RIGHT W WO CONTRAST  Result Date: 01/07/2021 CLINICAL DATA:  Pathologic proximal humerus fracture. History of metastatic breast cancer. EXAM: MRI OF THE RIGHT SHOULDER WITHOUT AND WITH CONTRAST; MRI OF THE RIGHT HUMERUS WITHOUT AND WITH CONTRAST TECHNIQUE: Multiplanar, multisequence MR imaging of the right shoulder and humerus was performed before and after the administration of intravenous contrast. CONTRAST:  59mL GADAVIST GADOBUTROL 1 MMOL/ML IV SOLN COMPARISON:  Right humerus x-rays from yesterday. Right shoulder x-rays dated October 24, 2020. FINDINGS: Rotator cuff:  Intact rotator cuff.  Moderate tendinosis. Muscles: Diffuse muscle edema about the right shoulder, presumably related to prior radiation. No significant atrophy. Biceps long head:  Intact and normally positioned. Acromioclavicular Joint: Moderate arthropathy of the acromioclavicular  joint. Type I acromion. No subacromial/subdeltoid bursal fluid. Glenohumeral Joint: Small joint effusion.  No chondral defect. Labrum:  Intact. Bones: Large marrow  replacing lesion involving the majority of the humeral head and proximal metadiaphysis, spanning a length of approximately 8.5 cm, best appreciated on the sagittal T1 images. There is a subacute mildly displaced pathologic fracture of the proximal metadiaphysis with 5 mm anterior displacement. There is a small amount of subperiosteal fluid. No additional metastatic lesions involving the right upper arm. Incompletely evaluated metastases involving multiple thoracic vertebral bodies. Other: Scattered soft tissue swelling of the right upper arm. Partially visualized left pleural effusion. IMPRESSION: 1. Large osseous metastasis involving the majority of the humeral head and proximal metadiaphysis, spanning a length of approximately 8.5 cm. Associated subacute mildly displaced pathologic fracture of the proximal metadiaphysis. 2. No additional metastatic lesions involving the right upper arm. 3. Incompletely evaluated thoracic vertebral metastatic disease. 4. Intact rotator cuff with moderate tendinosis. Electronically Signed   By: Titus Dubin M.D.   On: 01/07/2021 13:18   MR SHOULDER RIGHT W WO CONTRAST  Result Date: 01/07/2021 CLINICAL DATA:  Pathologic proximal humerus fracture. History of metastatic breast cancer. EXAM: MRI OF THE RIGHT SHOULDER WITHOUT AND WITH CONTRAST; MRI OF THE RIGHT HUMERUS WITHOUT AND WITH CONTRAST TECHNIQUE: Multiplanar, multisequence MR imaging of the right shoulder and humerus was performed before and after the administration of intravenous contrast. CONTRAST:  33mL GADAVIST GADOBUTROL 1 MMOL/ML IV SOLN COMPARISON:  Right humerus x-rays from yesterday. Right shoulder x-rays dated October 24, 2020. FINDINGS: Rotator cuff:  Intact rotator cuff.  Moderate tendinosis. Muscles: Diffuse muscle edema about the right shoulder, presumably related to prior radiation. No significant atrophy. Biceps long head:  Intact and normally positioned. Acromioclavicular Joint: Moderate arthropathy of the  acromioclavicular joint. Type I acromion. No subacromial/subdeltoid bursal fluid. Glenohumeral Joint: Small joint effusion.  No chondral defect. Labrum:  Intact. Bones: Large marrow replacing lesion involving the majority of the humeral head and proximal metadiaphysis, spanning a length of approximately 8.5 cm, best appreciated on the sagittal T1 images. There is a subacute mildly displaced pathologic fracture of the proximal metadiaphysis with 5 mm anterior displacement. There is a small amount of subperiosteal fluid. No additional metastatic lesions involving the right upper arm. Incompletely evaluated metastases involving multiple thoracic vertebral bodies. Other: Scattered soft tissue swelling of the right upper arm. Partially visualized left pleural effusion. IMPRESSION: 1. Large osseous metastasis involving the majority of the humeral head and proximal metadiaphysis, spanning a length of approximately 8.5 cm. Associated subacute mildly displaced pathologic fracture of the proximal metadiaphysis. 2. No additional metastatic lesions involving the right upper arm. 3. Incompletely evaluated thoracic vertebral metastatic disease. 4. Intact rotator cuff with moderate tendinosis. Electronically Signed   By: Titus Dubin M.D.   On: 01/07/2021 13:18   US THORACENTESIS ASP PLEURAL SPACE W/IMG GUIDE  Result Date: 01/06/2021 INDICATION: Patient with history of metastatic breast carcinoma, dyspnea, recurrent malignant left pleural effusion; request received for diagnostic and therapeutic left thoracentesis. EXAM: ULTRASOUND GUIDED DIAGNOSTIC AND THERAPEUTIC LEFT THORACENTESIS MEDICATIONS: 1% lidocaine to skin and subcutaneous tissue COMPLICATIONS: None immediate. PROCEDURE: An ultrasound guided thoracentesis was thoroughly discussed with the patient and questions answered. The benefits, risks, alternatives and complications were also discussed. The patient understands and wishes to proceed with the procedure. Written  consent was obtained. Ultrasound was performed to localize and mark an adequate pocket of fluid in the left chest. The area was then prepped and draped in the normal  sterile fashion. 1% Lidocaine was used for local anesthesia. Under ultrasound guidance a 6 Fr Safe-T-Centesis catheter was introduced. Thoracentesis was performed. The catheter was removed and a dressing applied. FINDINGS: A total of approximately 1.2 liters of blood-tinged fluid was removed. Samples were sent to the laboratory as requested by the clinical team. Due to patient chest discomfort only the above amount of fluid was removed today. IMPRESSION: Successful ultrasound guided diagnostic and therapeutic left thoracentesis yielding 1.2 liters of pleural fluid. Read by: Rowe Robert, PA-C Electronically Signed   By: Ruthann Cancer MD   On: 01/06/2021 13:14      Flora Lipps, MD  [facility] 01/08/2021  If 7PM-7AM, please contact night-coverage

## 2021-01-09 ENCOUNTER — Other Ambulatory Visit (HOSPITAL_COMMUNITY): Payer: Self-pay

## 2021-01-09 DIAGNOSIS — J9601 Acute respiratory failure with hypoxia: Secondary | ICD-10-CM | POA: Diagnosis not present

## 2021-01-09 DIAGNOSIS — C7951 Secondary malignant neoplasm of bone: Secondary | ICD-10-CM | POA: Diagnosis not present

## 2021-01-09 DIAGNOSIS — N179 Acute kidney failure, unspecified: Secondary | ICD-10-CM | POA: Diagnosis not present

## 2021-01-09 DIAGNOSIS — J90 Pleural effusion, not elsewhere classified: Secondary | ICD-10-CM | POA: Diagnosis not present

## 2021-01-09 LAB — CBC WITH DIFFERENTIAL/PLATELET
Abs Immature Granulocytes: 0.4 10*3/uL — ABNORMAL HIGH (ref 0.00–0.07)
Band Neutrophils: 16 %
Basophils Absolute: 0.1 10*3/uL (ref 0.0–0.1)
Basophils Relative: 2 %
Eosinophils Absolute: 0.4 10*3/uL (ref 0.0–0.5)
Eosinophils Relative: 12 %
HCT: 27.6 % — ABNORMAL LOW (ref 36.0–46.0)
Hemoglobin: 8.7 g/dL — ABNORMAL LOW (ref 12.0–15.0)
Lymphocytes Relative: 12 %
Lymphs Abs: 0.4 10*3/uL — ABNORMAL LOW (ref 0.7–4.0)
MCH: 30.5 pg (ref 26.0–34.0)
MCHC: 31.5 g/dL (ref 30.0–36.0)
MCV: 96.8 fL (ref 80.0–100.0)
Metamyelocytes Relative: 5 %
Monocytes Absolute: 1 10*3/uL (ref 0.1–1.0)
Monocytes Relative: 30 %
Myelocytes: 6 %
Neutro Abs: 1.1 10*3/uL — ABNORMAL LOW (ref 1.7–7.7)
Neutrophils Relative %: 17 %
Platelets: 667 10*3/uL — ABNORMAL HIGH (ref 150–400)
RBC: 2.85 MIL/uL — ABNORMAL LOW (ref 3.87–5.11)
RDW: 17.9 % — ABNORMAL HIGH (ref 11.5–15.5)
WBC: 3.4 10*3/uL — ABNORMAL LOW (ref 4.0–10.5)
nRBC: 0 % (ref 0.0–0.2)

## 2021-01-09 LAB — GLUCOSE, CAPILLARY
Glucose-Capillary: 102 mg/dL — ABNORMAL HIGH (ref 70–99)
Glucose-Capillary: 175 mg/dL — ABNORMAL HIGH (ref 70–99)
Glucose-Capillary: 93 mg/dL (ref 70–99)
Glucose-Capillary: 141 mg/dL — ABNORMAL HIGH (ref 70–99)

## 2021-01-09 LAB — CYTOLOGY - NON PAP

## 2021-01-09 LAB — PH, BODY FLUID: pH, Body Fluid: 7.6

## 2021-01-09 MED ORDER — METOPROLOL SUCCINATE ER 25 MG PO TB24
25.0000 mg | ORAL_TABLET | Freq: Every day | ORAL | Status: DC
  Administered 2021-01-09 – 2021-01-14 (×6): 25 mg via ORAL
  Filled 2021-01-09 (×6): qty 1

## 2021-01-09 NOTE — Progress Notes (Signed)
PROGRESS NOTE  Mia [name] QQP:[id number] DOB: 02-09-1956 DOA: 01/05/2021 PCP: Ronnald Nian, DO   LOS: 4 days   Brief narrative:   Mia [name] is a 65 y.o. female with medical history significant for stage IV metastatic breast cancer to lung and bones s/p right mastectomy, radiation on chemotherapy, history of malignant pleural effusion requiring thoracentesis, essential hypertension, insulin-dependent type 2 diabetes presented to hospital with difficulty urinating, shortness of breath and worsening tremors.  Patient does have chronic shortness of breath and tremors but had recently been worsening.   She had thoracentesis on 11/21/2020 with 1.5 L malignant effusion removed. She also notes chronic right-sided shoulder pain that started in January 2022 and has progressively gotten to the point where she can no longer lift her arm.  She was supposed to get an MRI to follow-up yesterday.   In the ED, patient was afebrile, borderline hypotensive, and had oxygen desaturation down to 78% requiring 2 L of O2.  Labs notable for significant neutropenia with WBC of 1, absolute neutrophils 0.3.  Hemoglobin 10.4, HCT of 33.7, Na of 128, Creatinine of 2.03 from 1.24.  Chest x-ray showed complete approximation of the left hemothorax. Post-void residual was zero and pt ultimately urinated in ED.  Patient was then admitted to hospital for further evaluation and treatment.  Assessment/Plan:  Principal Problem:   Acute respiratory failure with hypoxia (HCC) Active Problems:   Metastatic breast cancer ([place])   Bone metastases ([place])   Lung metastases ([place])   Peripheral neuropathy due to chemotherapy ([place])   Diabetes mellitus type 2 in obese (HCC)   Pleural effusion   Hypotension   AKI (acute kidney injury) ([place])   Hyponatremia   Neutropenia (HCC)   Shoulder pain, right  Right shoulder pain/pathologic fx of proximal right humerus with angulation  Has known metastatic disease to peri-glenoid  region of right scapula back in 2019.   Now noted to have new humerus fracture.  MRI of the right shoulder showed around 8 cm metastatic lesion over the proximal humerus with pathological fracture. Dr. Griffin Basil orthopedics on board, plan for IM nailing on 01/11/2021.  On sling.  As per the patient pain is under good control.  Acute hypoxic respiratory failure secondary to malignant left pleural effusion Shortness of breath has improved after thoracocentesis.  Status post ultrasound-guided thoracocentesis with 1.5 L of fluid removal.  Fluid culture  negative so far.  Cytology pending.  On nasal cannula oxygen at 2 L/min.  Will wean as able if not she might need oxygen to go home with.  Could repeat chest x-ray in 1 to 2 days.  Neutropenia Has improved.  WBC of 3.4 hemoglobin 8.7 and platelet of 667 at this time.    Received empiric antibiotic with cefepime.  No signs of infection so far.  Procalcitonin < 0.10.  MRSA PCR was negative.  Urine culture with insignificant colonies, blood cultures negative in 3 days..  Fluid culture negative so far.  Will discontinue cefepime.  Patient is afebrile.  Anemia likely secondary to malignancy.  Status post 1 unit of packed RBC.  Hemoglobin has improved.  Hemoglobin from today at 8.7.   Hypotension Initially on presentation.  Current blood pressure has improved, on amlodipine losartan and metoprolol at home.  Oral antihypertensives on hold at this time..  Will resume long-acting metoprolol.  Hyponatremia We will continue to monitor.  Sodium of 138 today.  Acute kidney injury. Improved after IV fluids.  Creatinine of 1.0.  Losartan on hold.  History of stage IV metastatic breast cancer to lungs and bones Follows with oncologist Dr. [name].  Oncology has seen the patient, will follow up as outpatient.  Chemo-induced peripheral neuropathy Continue gabapentin  Type 2 diabetes Continue to hold Actos and Janumet.  Continue moderate sliding scale insulin,  this, Accu-Cheks for now.    Hyperlipidemia Continue statin, fenofibrate   DVT prophylaxis: SCDs Start: 01/05/21 2046   Code Status: Full code  Family Communication:  Spoke with the patient's husband at bedside.  Status is: Inpatient  Remains inpatient appropriate because:IV treatments appropriate due to intensity of illness or inability to take PO and Inpatient level of care appropriate due to severity of illness, narcotics for pain relief, plan for IM nailing of the right humerus   Dispo: The patient is from: Home              Anticipated d/c is to: Home in 2 to 3 days               Patient currently is not medically stable to d/c.   Difficult to place patient No  Consultants:  Oncology  Orthopedics  Procedures:  Status post thoracocentesis on 01/06/2021.  Anti-infectives:  . Cefepime 4/7>4/10  Anti-infectives (From admission, onward)   Start     Dose/Rate Route Frequency Ordered Stop   01/06/21 1400  ceFEPIme (MAXIPIME) 2 g in sodium chloride 0.9 % 100 mL IVPB        2 g 200 mL/hr over 30 Minutes Intravenous Every 12 hours 01/06/21 0807 01/08/21 1510   01/05/21 2100  vancomycin (VANCOREADY) IVPB 1500 mg/300 mL        1,500 mg 150 mL/hr over 120 Minutes Intravenous  Once 01/05/21 2055 01/06/21 0305   01/05/21 2100  ceFEPIme (MAXIPIME) 2 g in sodium chloride 0.9 % 100 mL IVPB        2 g 200 mL/hr over 30 Minutes Intravenous  Once 01/05/21 2055 01/06/21 0355     Subjective: Today, patient was seen and examined at bedside.  Complains of right arm pain especially on moving.  No nausea vomiting fever chills or rigor.  No shortness of breath cough or chest pain.  Objective: Vitals:   01/08/21 2052 01/09/21 0520  BP: 117/65 124/60  Pulse: (!) 107 88  Resp: 20 20  Temp:  98.1 F (36.7 C)  SpO2: 98% 100%    Intake/Output Summary (Last 24 hours) at 01/09/2021 0932 Last data filed at 01/09/2021 0745 Gross per 24 hour  Intake 560 ml  Output 775 ml  Net -215 ml    Filed Weights   01/05/21 1508  Weight: 99.3 kg   Body mass index is 37.59 kg/m.   Physical Exam: General:   not in obvious distress, obese, on nasal cannula oxygen HENT: Mild pallor noted.. Oral mucosa is moist.  Chest: .  Diminished breath sounds bilaterally.  Improved aeration on the left side. CVS: S1 &S2 heard. No murmur.  Regular rate and rhythm. Abdomen: Soft, nontender, nondistended.  Bowel sounds are heard.   Extremities: No cyanosis, clubbing trace lower extremity edema.  Right upper extremity on a sling.  Peripheral pulses are palpable. Psych: Alert, awake and oriented, normal mood CNS:  No cranial nerve deficits.  Power equal in all extremities.   Skin: Warm and dry.  No rashes noted.  Data Review: I have personally reviewed the following laboratory data and studies,  CBC: Recent Labs  Lab 01/05/21 1911 01/06/21 0134 01/07/21 0441 01/08/21 0557 01/09/21  0543  WBC 1.0* 1.4* 1.5* 2.3* 3.4*  NEUTROABS 0.3*  --  0.2* 0.1* 1.1*  HGB 10.4* 7.9* 6.5* 8.4* 8.7*  HCT 33.7* 25.8* 21.2* 26.5* 27.6*  MCV 100.0 101.6* 100.5* 96.0 96.8  PLT 418* 456* 577* 654* 098*   Basic Metabolic Panel: Recent Labs  Lab 01/05/21 1911 01/06/21 0134 01/07/21 0441 01/08/21 0557  NA 128* 129* 134* 138  K 5.0 4.8 5.0 5.1  CL 92* 94* 98 102  CO2 28 26 29 25   GLUCOSE 107* 117* 98 98  BUN 33* 31* 21 18  CREATININE 2.03* 1.72* 1.21* 1.00  CALCIUM 8.8* 8.9 8.5* 8.4*  MG  --   --  1.8 1.7  PHOS  --   --  2.4* 3.5   Liver Function Tests: Recent Labs  Lab 01/05/21 1911 01/08/21 0557  AST 17 24  ALT 10 9  ALKPHOS 103 83  BILITOT 0.5 0.9  PROT 6.0* 5.2*  ALBUMIN 2.7* 2.2*   No results for input(s): LIPASE, AMYLASE in the last 168 hours. No results for input(s): AMMONIA in the last 168 hours. Cardiac Enzymes: No results for input(s): CKTOTAL, CKMB, CKMBINDEX, TROPONINI in the last 168 hours. BNP (last 3 results) No results for input(s): BNP in the last 8760 hours.  ProBNP  (last 3 results) No results for input(s): PROBNP in the last 8760 hours.  CBG: Recent Labs  Lab 01/08/21 0745 01/08/21 1126 01/08/21 1710 01/08/21 2051 01/09/21 0748  GLUCAP 105* 128* 111* 116* 102*   Recent Results (from the past 240 hour(s))  SARS CORONAVIRUS 2 (TAT 6-24 HRS) Nasopharyngeal Nasopharyngeal Swab     Status: None   Collection Time: 01/03/21 12:53 PM   Specimen: Nasopharyngeal Swab  Result Value Ref Range Status   SARS Coronavirus 2 NEGATIVE NEGATIVE Final    Comment: (NOTE) SARS-CoV-2 target nucleic acids are NOT DETECTED.  The SARS-CoV-2 RNA is generally detectable in upper and lower respiratory specimens during the acute phase of infection. Negative results do not preclude SARS-CoV-2 infection, do not rule out co-infections with other pathogens, and should not be used as the sole basis for treatment or other patient management decisions. Negative results must be combined with clinical observations, patient history, and epidemiological information. The expected result is Negative.  Fact Sheet for Patients: SugarRoll.be  Fact Sheet for Healthcare Providers: https://www.woods-mathews.com/  This test is not yet approved or cleared by the [place] FDA and  has been authorized for detection and/or diagnosis of SARS-CoV-2 by FDA under an Emergency Use Authorization (EUA). This EUA will remain  in effect (meaning this test can be used) for the duration of the COVID-19 declaration under Se ction 564(b)(1) of the Act, 21 U.S.C. section 360bbb-3(b)(1), unless the authorization is terminated or revoked sooner.  Performed at [facility], [place] [address] [place], [place] [address]   Resp Panel by RT-PCR (Flu A&B, Covid) Nasopharyngeal Swab     Status: None   Collection Time: 01/05/21  4:20 PM   Specimen: Nasopharyngeal Swab; Nasopharyngeal(NP) swabs in vial transport medium  Result Value Ref Range Status    SARS Coronavirus 2 by RT PCR NEGATIVE NEGATIVE Final    Comment: (NOTE) SARS-CoV-2 target nucleic acids are NOT DETECTED.  The SARS-CoV-2 RNA is generally detectable in upper respiratory specimens during the acute phase of infection. The lowest concentration of SARS-CoV-2 viral copies this assay can detect is 138 copies/mL. A negative result does not preclude SARS-Cov-2 infection and should not be used as the  sole basis for treatment or other patient management decisions. A negative result may occur with  improper specimen collection/handling, submission of specimen other than nasopharyngeal swab, presence of viral mutation(s) within the areas targeted by this assay, and inadequate number of viral copies(<138 copies/mL). A negative result must be combined with clinical observations, patient history, and epidemiological information. The expected result is Negative.  Fact Sheet for Patients:  EntrepreneurPulse.com.au  Fact Sheet for Healthcare Providers:  IncredibleEmployment.be  This test is no t yet approved or cleared by the [place] FDA and  has been authorized for detection and/or diagnosis of SARS-CoV-2 by FDA under an Emergency Use Authorization (EUA). This EUA will remain  in effect (meaning this test can be used) for the duration of the COVID-19 declaration under Section 564(b)(1) of the Act, 21 U.S.C.section 360bbb-3(b)(1), unless the authorization is terminated  or revoked sooner.       Influenza A by PCR NEGATIVE NEGATIVE Final   Influenza B by PCR NEGATIVE NEGATIVE Final    Comment: (NOTE) The Xpert Xpress SARS-CoV-2/FLU/RSV plus assay is intended as an aid in the diagnosis of influenza from Nasopharyngeal swab specimens and should not be used as a sole basis for treatment. Nasal washings and aspirates are unacceptable for Xpert Xpress SARS-CoV-2/FLU/RSV testing.  Fact Sheet for  Patients: EntrepreneurPulse.com.au  Fact Sheet for Healthcare Providers: IncredibleEmployment.be  This test is not yet approved or cleared by the [place] FDA and has been authorized for detection and/or diagnosis of SARS-CoV-2 by FDA under an Emergency Use Authorization (EUA). This EUA will remain in effect (meaning this test can be used) for the duration of the COVID-19 declaration under Section 564(b)(1) of the Act, 21 U.S.C. section 360bbb-3(b)(1), unless the authorization is terminated or revoked.  Performed at [facility], [place] [address] [place], [place] [address]   Urine culture     Status: Abnormal   Collection Time: 01/05/21 10:00 PM   Specimen: Urine, Random  Result Value Ref Range Status   Specimen Description   Final    URINE, RANDOM Performed at [facility] [address] [place], [place] [address]    Special Requests   Final    NONE Performed at [facility], [place] [address] [place], [place] [address]    Culture 20,000 COLONIES/mL ESCHERICHIA COLI (A)  Final   Report Status 01/08/2021 FINAL  Final   Organism ID, Bacteria ESCHERICHIA COLI (A)  Final      Susceptibility   Escherichia coli - MIC*    AMPICILLIN <=2 SENSITIVE Sensitive     CEFAZOLIN <=4 SENSITIVE Sensitive     CEFEPIME <=0.12 SENSITIVE Sensitive     CEFTRIAXONE <=0.25 SENSITIVE Sensitive     CIPROFLOXACIN <=0.25 SENSITIVE Sensitive     GENTAMICIN <=1 SENSITIVE Sensitive     IMIPENEM <=0.25 SENSITIVE Sensitive     NITROFURANTOIN <=16 SENSITIVE Sensitive     TRIMETH/SULFA <=20 SENSITIVE Sensitive     AMPICILLIN/SULBACTAM <=2 SENSITIVE Sensitive     PIP/TAZO <=4 SENSITIVE Sensitive     * 20,000 COLONIES/mL ESCHERICHIA COLI  Culture, blood (routine x 2)     Status: None (Preliminary result)   Collection Time: 01/06/21  1:34 AM   Specimen: BLOOD  Result Value Ref Range Status   Specimen  Description   Final    BLOOD BLOOD LEFT HAND Performed at [facility] [address] [place], [place] [address]    Special Requests   Final  BOTTLES DRAWN AEROBIC ONLY Blood Culture adequate volume Performed at [facility] [address], [place], [place] [address]    Culture   Final    NO GROWTH 3 DAYS Performed at [facility], [place] [address] [place], [place] [address]    Report Status PENDING  Incomplete  Culture, blood (routine x 2)     Status: None (Preliminary result)   Collection Time: 01/06/21  1:34 AM   Specimen: BLOOD  Result Value Ref Range Status   Specimen Description   Final    BLOOD BLOOD LEFT WRIST Performed at [place] [address] [place], [place] [address]    Special Requests   Final    BOTTLES DRAWN AEROBIC ONLY Blood Culture adequate volume Performed at [facility] [address], [place], [place] [address]    Culture   Final    NO GROWTH 3 DAYS Performed at [facility], [place] [address], [place], [place] [address]    Report Status PENDING  Incomplete  MRSA PCR Screening     Status: None   Collection Time: 01/06/21  6:22 AM   Specimen: Nasal Mucosa; Nasopharyngeal  Result Value Ref Range Status   MRSA by PCR NEGATIVE NEGATIVE Final    Comment:        The GeneXpert MRSA Assay (FDA approved for NASAL specimens only), is one component of a comprehensive MRSA colonization surveillance program. It is not intended to diagnose MRSA infection nor to guide or monitor treatment for MRSA infections. Performed at [facility], [place] [address], [place], [place] [address]   Body fluid culture w Gram Stain     Status: None (Preliminary result)   Collection Time: 01/06/21  1:03 PM   Specimen: PATH Cytology Pleural fluid  Result Value Ref Range Status   Specimen Description   Final    PLEURAL Performed at [place] [address] [place], [place] [address]    Special Requests   Final    NONE Performed at [facility], [place] [address], [place], [place] [address]    Gram Stain NO WBC SEEN NO ORGANISMS SEEN   Final   Culture   Final    NO GROWTH 2 DAYS Performed at [facility], [place] [address], [place], [place] [address]    Report Status PENDING  Incomplete  Surgical PCR screen     Status: None   Collection Time: 01/08/21  5:23 PM   Specimen: Nasal Mucosa; Nasal Swab  Result Value Ref Range Status   MRSA, PCR NEGATIVE NEGATIVE Final   Staphylococcus aureus NEGATIVE NEGATIVE Final    Comment: (NOTE) The Xpert SA Assay (FDA approved for NASAL specimens in patients 26 years of age and older), is one component of a comprehensive surveillance program. It is not intended to diagnose infection nor to guide or monitor treatment. Performed at [facility], [place] [address], [place],  [address]      Studies: MR HUMERUS RIGHT W WO CONTRAST  Result Date: 01/07/2021 CLINICAL DATA:  Pathologic proximal humerus fracture. History of metastatic breast cancer. EXAM: MRI OF THE RIGHT SHOULDER WITHOUT AND WITH CONTRAST; MRI OF THE RIGHT HUMERUS WITHOUT AND WITH CONTRAST TECHNIQUE: Multiplanar, multisequence MR imaging of the right shoulder and humerus was performed before and after the administration of intravenous contrast. CONTRAST:  97mL GADAVIST GADOBUTROL 1 MMOL/ML IV SOLN COMPARISON:  Right humerus x-rays from  yesterday. Right shoulder x-rays dated October 24, 2020. FINDINGS: Rotator cuff:  Intact rotator cuff.  Moderate tendinosis. Muscles: Diffuse muscle edema about the right shoulder, presumably related to prior radiation. No significant atrophy. Biceps long head:  Intact and normally positioned. Acromioclavicular Joint: Moderate arthropathy of the acromioclavicular joint. Type I acromion. No subacromial/subdeltoid bursal fluid. Glenohumeral Joint: Small joint  effusion.  No chondral defect. Labrum:  Intact. Bones: Large marrow replacing lesion involving the majority of the humeral head and proximal metadiaphysis, spanning a length of approximately 8.5 cm, best appreciated on the sagittal T1 images. There is a subacute mildly displaced pathologic fracture of the proximal metadiaphysis with 5 mm anterior displacement. There is a small amount of subperiosteal fluid. No additional metastatic lesions involving the right upper arm. Incompletely evaluated metastases involving multiple thoracic vertebral bodies. Other: Scattered soft tissue swelling of the right upper arm. Partially visualized left pleural effusion. IMPRESSION: 1. Large osseous metastasis involving the majority of the humeral head and proximal metadiaphysis, spanning a length of approximately 8.5 cm. Associated subacute mildly displaced pathologic fracture of the proximal metadiaphysis. 2. No additional metastatic lesions involving the right upper arm. 3. Incompletely evaluated thoracic vertebral metastatic disease. 4. Intact rotator cuff with moderate tendinosis. Electronically Signed   By: Titus Dubin M.D.   On: 01/07/2021 13:18   MR SHOULDER RIGHT W WO CONTRAST  Result Date: 01/07/2021 CLINICAL DATA:  Pathologic proximal humerus fracture. History of metastatic breast cancer. EXAM: MRI OF THE RIGHT SHOULDER WITHOUT AND WITH CONTRAST; MRI OF THE RIGHT HUMERUS WITHOUT AND WITH CONTRAST TECHNIQUE: Multiplanar, multisequence MR imaging of the right shoulder and humerus was performed before and after the administration of intravenous contrast. CONTRAST:  66mL GADAVIST GADOBUTROL 1 MMOL/ML IV SOLN COMPARISON:  Right humerus x-rays from yesterday. Right shoulder x-rays dated October 24, 2020. FINDINGS: Rotator cuff:  Intact rotator cuff.  Moderate tendinosis. Muscles: Diffuse muscle edema about the right shoulder, presumably related to prior radiation. No significant atrophy. Biceps long head:  Intact and  normally positioned. Acromioclavicular Joint: Moderate arthropathy of the acromioclavicular joint. Type I acromion. No subacromial/subdeltoid bursal fluid. Glenohumeral Joint: Small joint effusion.  No chondral defect. Labrum:  Intact. Bones: Large marrow replacing lesion involving the majority of the humeral head and proximal metadiaphysis, spanning a length of approximately 8.5 cm, best appreciated on the sagittal T1 images. There is a subacute mildly displaced pathologic fracture of the proximal metadiaphysis with 5 mm anterior displacement. There is a small amount of subperiosteal fluid. No additional metastatic lesions involving the right upper arm. Incompletely evaluated metastases involving multiple thoracic vertebral bodies. Other: Scattered soft tissue swelling of the right upper arm. Partially visualized left pleural effusion. IMPRESSION: 1. Large osseous metastasis involving the majority of the humeral head and proximal metadiaphysis, spanning a length of approximately 8.5 cm. Associated subacute mildly displaced pathologic fracture of the proximal metadiaphysis. 2. No additional metastatic lesions involving the right upper arm. 3. Incompletely evaluated thoracic vertebral metastatic disease. 4. Intact rotator cuff with moderate tendinosis. Electronically Signed   By: Titus Dubin M.D.   On: 01/07/2021 13:18      Flora Lipps, MD  [facility] 01/09/2021  If 7PM-7AM, please contact night-coverage

## 2021-01-09 NOTE — Progress Notes (Signed)
Mia [name]   DOB:09/26/1956   QI#:[id number]   LOV#:[id number]  Subjective:  Mia [name] tells me her pain is moderately well-controlled on BID oxycontin (was on TID previously and that caused spams and ureteral dysfunction); she is now voiding more normally; also having normal BMs. The right arm doesn't hurt if she doesn;t move it, but if she does move it there is a great deal of pain. She is very hopeful that surgery 04/13 will make the arm functinal again and bring th pain down to a level she can be more active. Husband in room   Objective: white woman examined in bed Vitals:   01/08/21 2052 01/09/21 0520  BP: 117/65 124/60  [name]: (!) 107 88  Resp: 20 20  Temp:  98.1 F (36.7 C)  SpO2: 98% 100%    Body mass index is 37.59 kg/m.  Intake/Output Summary (Last 24 hours) at 01/09/2021 0851 Last data filed at 01/09/2021 0745 Gross per 24 hour  Intake 560 ml  Output 775 ml  Net -215 ml     CBG (last 3)  Recent Labs    01/08/21 1710 01/08/21 2051 01/09/21 0748  GLUCAP 111* 116* 102*     Labs:  Lab Results  Component Value Date   WBC 3.4 (L) 01/09/2021   HGB 8.7 (L) 01/09/2021   HCT 27.6 (L) 01/09/2021   MCV 96.8 01/09/2021   PLT 667 (H) 01/09/2021   NEUTROABS 1.1 (L) 01/09/2021    '@LASTCHEMISTRY' @  Urine Studies No results for input(s): UHGB, CRYS in the last 72 hours.  Invalid input(s): UACOL, UAPR, USPG, UPH, UTP, UGL, UKET, UBIL, UNIT, UROB, [place], UEPI, UWBC, [place], [place], [place], [place], [place]  Basic Metabolic Panel: Recent Labs  Lab 01/05/21 1911 01/06/21 0134 01/07/21 0441 01/08/21 0557  NA 128* 129* 134* 138  K 5.0 4.8 5.0 5.1  CL 92* 94* 98 102  CO2 '28 26 29 25  ' GLUCOSE 107* 117* 98 98  BUN 33* 31* 21 18  CREATININE 2.03* 1.72* 1.21* 1.00  CALCIUM 8.8* 8.9 8.5* 8.4*  MG  --   --  1.8 1.7  PHOS  --   --  2.4* 3.5   GFR Estimated Creatinine Clearance: 65 mL/min (by C-G formula based on SCr of 1 mg/dL). Liver Function Tests: Recent Labs  Lab  01/05/21 1911 01/08/21 0557  AST 17 24  ALT 10 9  ALKPHOS 103 83  BILITOT 0.5 0.9  PROT 6.0* 5.2*  ALBUMIN 2.7* 2.2*   No results for input(s): LIPASE, AMYLASE in the last 168 hours. No results for input(s): AMMONIA in the last 168 hours. Coagulation profile No results for input(s): INR, PROTIME in the last 168 hours.  CBC: Recent Labs  Lab 01/05/21 1911 01/06/21 0134 01/07/21 0441 01/08/21 0557 01/09/21 0543  WBC 1.0* 1.4* 1.5* 2.3* 3.4*  NEUTROABS 0.3*  --  0.2* 0.1* 1.1*  HGB 10.4* 7.9* 6.5* 8.4* 8.7*  HCT 33.7* 25.8* 21.2* 26.5* 27.6*  MCV 100.0 101.6* 100.5* 96.0 96.8  PLT 418* 456* 577* 654* 667*   Cardiac Enzymes: No results for input(s): CKTOTAL, CKMB, CKMBINDEX, TROPONINI in the last 168 hours. BNP: Invalid input(s): POCBNP CBG: Recent Labs  Lab 01/08/21 0745 01/08/21 1126 01/08/21 1710 01/08/21 2051 01/09/21 0748  GLUCAP 105* 128* 111* 116* 102*   D-Dimer No results for input(s): DDIMER in the last 72 hours. Hgb A1c No results for input(s): HGBA1C in the last 72 hours. Lipid Profile No results for input(s): CHOL, HDL, LDLCALC, TRIG, CHOLHDL, LDLDIRECT  in the last 72 hours. Thyroid function studies No results for input(s): TSH, T4TOTAL, T3FREE, THYROIDAB in the last 72 hours.  Invalid input(s): FREET3 Anemia work up No results for input(s): VITAMINB12, FOLATE, FERRITIN, TIBC, IRON, RETICCTPCT in the last 72 hours. Microbiology Recent Results (from the past 240 hour(s))  SARS CORONAVIRUS 2 (TAT 6-24 HRS) Nasopharyngeal Nasopharyngeal Swab     Status: None   Collection Time: 01/03/21 12:53 PM   Specimen: Nasopharyngeal Swab  Result Value Ref Range Status   SARS Coronavirus 2 NEGATIVE NEGATIVE Final    Comment: (NOTE) SARS-CoV-2 target nucleic acids are NOT DETECTED.  The SARS-CoV-2 RNA is generally detectable in upper and lower respiratory specimens during the acute phase of infection. Negative results do not preclude SARS-CoV-2 infection, do  not rule out co-infections with other pathogens, and should not be used as the sole basis for treatment or other patient management decisions. Negative results must be combined with clinical observations, patient history, and epidemiological information. The expected result is Negative.  Fact Sheet for Patients: SugarRoll.be  Fact Sheet for Healthcare Providers: https://www.woods-mathews.com/  This test is not yet approved or cleared by the [place] FDA and  has been authorized for detection and/or diagnosis of SARS-CoV-2 by FDA under an Emergency Use Authorization (EUA). This EUA will remain  in effect (meaning this test can be used) for the duration of the COVID-19 declaration under Se ction 564(b)(1) of the Act, 21 U.S.C. section 360bbb-3(b)(1), unless the authorization is terminated or revoked sooner.  Performed at [facility], [place] [address], [place], [place] [address]   Resp Panel by RT-PCR (Flu A&B, Covid) Nasopharyngeal Swab     Status: None   Collection Time: 01/05/21  4:20 PM   Specimen: Nasopharyngeal Swab; Nasopharyngeal(NP) swabs in vial transport medium  Result Value Ref Range Status   SARS Coronavirus 2 by RT PCR NEGATIVE NEGATIVE Final    Comment: (NOTE) SARS-CoV-2 target nucleic acids are NOT DETECTED.  The SARS-CoV-2 RNA is generally detectable in upper respiratory specimens during the acute phase of infection. The lowest concentration of SARS-CoV-2 viral copies this assay can detect is 138 copies/mL. A negative result does not preclude SARS-Cov-2 infection and should not be used as the sole basis for treatment or other patient management decisions. A negative result may occur with  improper specimen collection/handling, submission of specimen other than nasopharyngeal swab, presence of viral mutation(s) within the areas targeted by this assay, and inadequate number of viral copies(<138 copies/mL). A  negative result must be combined with clinical observations, patient history, and epidemiological information. The expected result is Negative.  Fact Sheet for Patients:  EntrepreneurPulse.com.au  Fact Sheet for Healthcare Providers:  IncredibleEmployment.be  This test is no t yet approved or cleared by the [place] FDA and  has been authorized for detection and/or diagnosis of SARS-CoV-2 by FDA under an Emergency Use Authorization (EUA). This EUA will remain  in effect (meaning this test can be used) for the duration of the COVID-19 declaration under Section 564(b)(1) of the Act, 21 U.S.C.section 360bbb-3(b)(1), unless the authorization is terminated  or revoked sooner.       Influenza A by PCR NEGATIVE NEGATIVE Final   Influenza B by PCR NEGATIVE NEGATIVE Final    Comment: (NOTE) The Xpert Xpress SARS-CoV-2/FLU/RSV plus assay is intended as an aid in the diagnosis of influenza from Nasopharyngeal swab specimens and should not be used as a sole basis for treatment. Nasal washings and aspirates are unacceptable for  Xpert Xpress SARS-CoV-2/FLU/RSV testing.  Fact Sheet for Patients: EntrepreneurPulse.com.au  Fact Sheet for Healthcare Providers: IncredibleEmployment.be  This test is not yet approved or cleared by the [place] FDA and has been authorized for detection and/or diagnosis of SARS-CoV-2 by FDA under an Emergency Use Authorization (EUA). This EUA will remain in effect (meaning this test can be used) for the duration of the COVID-19 declaration under Section 564(b)(1) of the Act, 21 U.S.C. section 360bbb-3(b)(1), unless the authorization is terminated or revoked.  Performed at [facility], [place] [address], [place], [place] [address]   Urine culture     Status: Abnormal   Collection Time: 01/05/21 10:00 PM   Specimen: Urine, Random  Result Value Ref Range Status    Specimen Description   Final    URINE, RANDOM Performed at [facility] [address] [place], [place] [address]    Special Requests   Final    NONE Performed at [facility], [place] [address], [place], [place] [address]    Culture 20,000 COLONIES/mL ESCHERICHIA COLI (A)  Final   Report Status 01/08/2021 FINAL  Final   Organism ID, Bacteria ESCHERICHIA COLI (A)  Final      Susceptibility   Escherichia coli - MIC*    AMPICILLIN <=2 SENSITIVE Sensitive     CEFAZOLIN <=4 SENSITIVE Sensitive     CEFEPIME <=0.12 SENSITIVE Sensitive     CEFTRIAXONE <=0.25 SENSITIVE Sensitive     CIPROFLOXACIN <=0.25 SENSITIVE Sensitive     GENTAMICIN <=1 SENSITIVE Sensitive     IMIPENEM <=0.25 SENSITIVE Sensitive     NITROFURANTOIN <=16 SENSITIVE Sensitive     TRIMETH/SULFA <=20 SENSITIVE Sensitive     AMPICILLIN/SULBACTAM <=2 SENSITIVE Sensitive     PIP/TAZO <=4 SENSITIVE Sensitive     * 20,000 COLONIES/mL ESCHERICHIA COLI  Culture, blood (routine x 2)     Status: None (Preliminary result)   Collection Time: 01/06/21  1:34 AM   Specimen: BLOOD  Result Value Ref Range Status   Specimen Description   Final    BLOOD BLOOD LEFT HAND Performed at [facility] [address], [place], [place] [address]    Special Requests   Final    BOTTLES DRAWN AEROBIC ONLY Blood Culture adequate volume Performed at [facility] [address] [place], [place] [address]    Culture   Final    NO GROWTH 3 DAYS Performed at [facility], [place] [address], [place], [place] [address]    Report Status PENDING  Incomplete  Culture, blood (routine x 2)     Status: None (Preliminary result)   Collection Time: 01/06/21  1:34 AM   Specimen: BLOOD  Result Value Ref Range Status   Specimen Description   Final    BLOOD BLOOD LEFT WRIST Performed at [facility] [address] [place], [place] [address]    Special  Requests   Final    BOTTLES DRAWN AEROBIC ONLY Blood Culture adequate volume Performed at [facility] [address], [place], [place] [address]    Culture   Final    NO GROWTH 3 DAYS Performed at [facility], [place] [address], [place], [place] [address]    Report Status PENDING  Incomplete  MRSA PCR Screening     Status: None   Collection Time: 01/06/21  6:22 AM   Specimen: Nasal Mucosa; Nasopharyngeal  Result Value Ref Range Status  MRSA by PCR NEGATIVE NEGATIVE Final    Comment:        The GeneXpert MRSA Assay (FDA approved for NASAL specimens only), is one component of a comprehensive MRSA colonization surveillance program. It is not intended to diagnose MRSA infection nor to guide or monitor treatment for MRSA infections. Performed at [facility], [place] [address] [place], [place] [address]   Body fluid culture w Gram Stain     Status: None (Preliminary result)   Collection Time: 01/06/21  1:03 PM   Specimen: PATH Cytology Pleural fluid  Result Value Ref Range Status   Specimen Description   Final    PLEURAL Performed at [facility] [address] [place], [place] [address]    Special Requests   Final    NONE Performed at [facility], [place] [address], [place], [place] [address]    Gram Stain NO WBC SEEN NO ORGANISMS SEEN   Final   Culture   Final    NO GROWTH 2 DAYS Performed at [facility], [place] [address] [place], [place] [address]    Report Status PENDING  Incomplete  Surgical PCR screen     Status: None   Collection Time: 01/08/21  5:23 PM   Specimen: Nasal Mucosa; Nasal Swab  Result Value Ref Range Status   MRSA, PCR NEGATIVE NEGATIVE Final   Staphylococcus aureus NEGATIVE NEGATIVE Final    Comment: (NOTE) The Xpert SA Assay (FDA approved for NASAL specimens in patients 55 years of age and older), is one component of a comprehensive surveillance program. It is  not intended to diagnose infection nor to guide or monitor treatment. Performed at [facility], [place] [address], [place], [place] [address]       Studies:  MR HUMERUS RIGHT W WO CONTRAST  Result Date: 01/07/2021 CLINICAL DATA:  Pathologic proximal humerus fracture. History of metastatic breast cancer. EXAM: MRI OF THE RIGHT SHOULDER WITHOUT AND WITH CONTRAST; MRI OF THE RIGHT HUMERUS WITHOUT AND WITH CONTRAST TECHNIQUE: Multiplanar, multisequence MR imaging of the right shoulder and humerus was performed before and after the administration of intravenous contrast. CONTRAST:  18m GADAVIST GADOBUTROL 1 MMOL/ML IV SOLN COMPARISON:  Right humerus x-rays from yesterday. Right shoulder x-rays dated October 24, 2020. FINDINGS: Rotator cuff:  Intact rotator cuff.  Moderate tendinosis. Muscles: Diffuse muscle edema about the right shoulder, presumably related to prior radiation. No significant atrophy. Biceps long head:  Intact and normally positioned. Acromioclavicular Joint: Moderate arthropathy of the acromioclavicular joint. Type I acromion. No subacromial/subdeltoid bursal fluid. Glenohumeral Joint: Small joint effusion.  No chondral defect. Labrum:  Intact. Bones: Large marrow replacing lesion involving the majority of the humeral head and proximal metadiaphysis, spanning a length of approximately 8.5 cm, best appreciated on the sagittal T1 images. There is a subacute mildly displaced pathologic fracture of the proximal metadiaphysis with 5 mm anterior displacement. There is a small amount of subperiosteal fluid. No additional metastatic lesions involving the right upper arm. Incompletely evaluated metastases involving multiple thoracic vertebral bodies. Other: Scattered soft tissue swelling of the right upper arm. Partially visualized left pleural effusion. IMPRESSION: 1. Large osseous metastasis involving the majority of the humeral head and proximal metadiaphysis, spanning a length of  approximately 8.5 cm. Associated subacute mildly displaced pathologic fracture of the proximal metadiaphysis. 2. No additional metastatic lesions involving the right upper arm. 3. Incompletely evaluated thoracic vertebral metastatic disease. 4. Intact rotator cuff with moderate tendinosis.  Electronically Signed   By: Titus Dubin M.D.   On: 01/07/2021 13:18   MR SHOULDER RIGHT W WO CONTRAST  Result Date: 01/07/2021 CLINICAL DATA:  Pathologic proximal humerus fracture. History of metastatic breast cancer. EXAM: MRI OF THE RIGHT SHOULDER WITHOUT AND WITH CONTRAST; MRI OF THE RIGHT HUMERUS WITHOUT AND WITH CONTRAST TECHNIQUE: Multiplanar, multisequence MR imaging of the right shoulder and humerus was performed before and after the administration of intravenous contrast. CONTRAST:  43m GADAVIST GADOBUTROL 1 MMOL/ML IV SOLN COMPARISON:  Right humerus x-rays from yesterday. Right shoulder x-rays dated October 24, 2020. FINDINGS: Rotator cuff:  Intact rotator cuff.  Moderate tendinosis. Muscles: Diffuse muscle edema about the right shoulder, presumably related to prior radiation. No significant atrophy. Biceps long head:  Intact and normally positioned. Acromioclavicular Joint: Moderate arthropathy of the acromioclavicular joint. Type I acromion. No subacromial/subdeltoid bursal fluid. Glenohumeral Joint: Small joint effusion.  No chondral defect. Labrum:  Intact. Bones: Large marrow replacing lesion involving the majority of the humeral head and proximal metadiaphysis, spanning a length of approximately 8.5 cm, best appreciated on the sagittal T1 images. There is a subacute mildly displaced pathologic fracture of the proximal metadiaphysis with 5 mm anterior displacement. There is a small amount of subperiosteal fluid. No additional metastatic lesions involving the right upper arm. Incompletely evaluated metastases involving multiple thoracic vertebral bodies. Other: Scattered soft tissue swelling of the right  upper arm. Partially visualized left pleural effusion. IMPRESSION: 1. Large osseous metastasis involving the majority of the humeral head and proximal metadiaphysis, spanning a length of approximately 8.5 cm. Associated subacute mildly displaced pathologic fracture of the proximal metadiaphysis. 2. No additional metastatic lesions involving the right upper arm. 3. Incompletely evaluated thoracic vertebral metastatic disease. 4. Intact rotator cuff with moderate tendinosis. Electronically Signed   By: WTitus DubinM.D.   On: 01/07/2021 13:18    Assessment: 65y.o[place] with stage IV breast cancer, as follows:  (1) status post right mastectomy in 2000, for a pT2 pN2, stage IIIA invasive ductal carcinoma, estrogen receptor positive, progesterone receptor not tested, HER-2 negative (0) by immunohistochemistry             (a) adjuvant chemotherapy with doxorubicin and cyclophosphamide in dose dense fashion x4 followed by paclitaxel in dose dense fashion x4             (b) adjuvant radiation: 30 doses             (c) antiestrogens: Tamoxifen for 5 years, completed 2005  (2) METASTASTIC DISEASE: 2008, involving bones, lungs, and lymph nodes             (a) CA 27-29 is informative             (b[facility] (T8, T10, T11, sternum, L4, L5); 0.6 cm left upper lobe lung nodule stable  (3)  prior anti-estrogen treatments:             (a) fulvestrant--progression             (b) exemestane/everolimus--hyperlipidemia, hyperglycemia  (4) prior chemotherapy treatments:             (a) capecitabine: progression             (b) Abraxane, August 2014 through 11/18/2015: good response but stopped due to neuropathy             (c) gemcitabine 01/20/2016--?; multiple interruptions  secondary to infections  (5) radiation therapy:             (a) T spine and Right femur, completed 01/10/2016  (6) letrozole/  palbociclib started 07/13/2016, discontinued 10/26/2020 with evidence of progression  (7) bone treatment:             (a) denosumab/Xgeva--discontinued January 2016 due to osteonecrosis of the jaw  (8) cancer associated pain: Updated 12/01/2020      (a) OxyIR 5 mg QID PRN             (b) PMP Aware reviewed 12/01/2020             (c) OxyContin 20 mg p.o. 3 times daily             (d) bowel prophylaxis: MiraLAX as needed  (9) restaging studies:             (a) mostly stable bone lesions with evidence of progression at L3-L5; no epidural tumor by total spinal MRI in November 2019             (b) no evidence of progressive lung, lymph node or liver lesions by CT scans of the chest abdomen and pelvis and bone scan December 2019             (c) CT scans abd/pelvis 03/31/2019 shows stable bone metastases, no extraosseous disease             (d) MRI pelvis 04/09/2019 shows bilateral ilium, left acetabulum and lumbar mets, stable             (e) CT abd/pelvis 09/01/2019 shows stable bone mets, slight increase left effusion             (f) CT of the chest 05/05/2020 again shows minimal increase in the left pleural effusion, possible resorption of bone around the left glenoid lesion, otherwise stable             (g) CT of the chest abdomen and pelvis 10/18/2020 again shows no evidence of visceral disease, but there is apparent progression in the bone disease, and some enlargement of the left pleural effusion.  The CA 27-29 has also been rising.  Accordingly her letrozole and palbociclib are being discontinued  (10) palliative radiation 11/18/2018 through 12/02/2018 Site/dose:1. Lumbar spine; 35 Gy in 14 fractions of 2.5 Gy 2. Pelvis; 35 Gy in 14 fractions of 2.5 Gy  (11) started cyclophosphamide, methotrexate and fluorouracil [CMF] 11/10/2020             (a) methotrexate held first dose, during palliative radiation  (b) CMF discontinued after 3d dose (12/22/2020) with no  evicence of response  (12) status post left thoracentesis 11/21/2020, with positive cytology             (a) tumor cells are strongly estrogen and progesterone receptor positive, HER-2 negative             (b) foundation 1 requested on the thoracentesis sample shows a PI K3 CA mutation.  Microsatellite status is stable and the tumor mutation burden is low.  There are mutations in ESR 1, GAT A3, and [place] 6   Plan:  Mia [name] is now day 19 cycle 3 CMF chemotherapy. Her WBC continues to improve and I expect her [place] will be more than adequate by 4/13 which is when her surgery is scheduled for.  She is now voiding more normally c/w the idea that ureteral spasm was the reason (due to narcotics; she is  now on lower doses of oxycontin). Breathing is improved after thoracentesis 4/8.  I have encouraged her to sit up OOBTC as much as possible during the day.  She is scheduled to see me 4/21 in the cancer clnic and we will start a new regimen (fulvestrant/alpelisib) that day.  Will follow with you.  Chauncey Cruel, MD 01/09/2021  8:51 AM Medical Oncology and Hematology [facility] [address] [place], [place] [address] Tel. [phone]    Fax. [phone]

## 2021-01-10 ENCOUNTER — Inpatient Hospital Stay (HOSPITAL_COMMUNITY): Payer: MEDICARE

## 2021-01-10 DIAGNOSIS — J9601 Acute respiratory failure with hypoxia: Secondary | ICD-10-CM | POA: Diagnosis not present

## 2021-01-10 DIAGNOSIS — C7951 Secondary malignant neoplasm of bone: Secondary | ICD-10-CM | POA: Diagnosis not present

## 2021-01-10 DIAGNOSIS — N179 Acute kidney failure, unspecified: Secondary | ICD-10-CM | POA: Diagnosis not present

## 2021-01-10 DIAGNOSIS — J90 Pleural effusion, not elsewhere classified: Secondary | ICD-10-CM | POA: Diagnosis not present

## 2021-01-10 LAB — PROTIME-INR
INR: 1.1 (ref 0.8–1.2)
Prothrombin Time: 13.5 seconds (ref 11.4–15.2)

## 2021-01-10 LAB — GLUCOSE, CAPILLARY
Glucose-Capillary: 115 mg/dL — ABNORMAL HIGH (ref 70–99)
Glucose-Capillary: 165 mg/dL — ABNORMAL HIGH (ref 70–99)
Glucose-Capillary: 144 mg/dL — ABNORMAL HIGH (ref 70–99)
Glucose-Capillary: 114 mg/dL — ABNORMAL HIGH (ref 70–99)

## 2021-01-10 LAB — CBC WITH DIFFERENTIAL/PLATELET
Abs Immature Granulocytes: 1.4 10*3/uL — ABNORMAL HIGH (ref 0.00–0.07)
Basophils Absolute: 0 10*3/uL (ref 0.0–0.1)
Basophils Relative: 0 %
Eosinophils Absolute: 0.2 10*3/uL (ref 0.0–0.5)
Eosinophils Relative: 4 %
HCT: 29.9 % — ABNORMAL LOW (ref 36.0–46.0)
Hemoglobin: 9.1 g/dL — ABNORMAL LOW (ref 12.0–15.0)
Lymphocytes Relative: 9 %
Lymphs Abs: 0.4 10*3/uL — ABNORMAL LOW (ref 0.7–4.0)
MCH: 30.3 pg (ref 26.0–34.0)
MCHC: 30.4 g/dL (ref 30.0–36.0)
MCV: 99.7 fL (ref 80.0–100.0)
Metamyelocytes Relative: 7 %
Monocytes Absolute: 1.2 10*3/uL — ABNORMAL HIGH (ref 0.1–1.0)
Monocytes Relative: 25 %
Myelocytes: 22 %
Neutro Abs: 1.4 10*3/uL — ABNORMAL LOW (ref 1.7–7.7)
Neutrophils Relative %: 31 %
Platelets: 653 10*3/uL — ABNORMAL HIGH (ref 150–400)
Promyelocytes Relative: 2 %
RBC: 3 MIL/uL — ABNORMAL LOW (ref 3.87–5.11)
RDW: 17.4 % — ABNORMAL HIGH (ref 11.5–15.5)
WBC: 4.6 10*3/uL (ref 4.0–10.5)
nRBC: 0 % (ref 0.0–0.2)

## 2021-01-10 LAB — BASIC METABOLIC PANEL
Anion gap: 7 (ref 5–15)
BUN: 12 mg/dL (ref 8–23)
CO2: 29 mmol/L (ref 22–32)
Calcium: 8.7 mg/dL — ABNORMAL LOW (ref 8.9–10.3)
Chloride: 97 mmol/L — ABNORMAL LOW (ref 98–111)
Creatinine, Ser: 0.87 mg/dL (ref 0.44–1.00)
GFR, Estimated: >60 mL/min (ref >60)
Glucose, Bld: 100 mg/dL — ABNORMAL HIGH (ref 70–99)
Potassium: 4.9 mmol/L (ref 3.5–5.1)
Sodium: 133 mmol/L — ABNORMAL LOW (ref 135–145)

## 2021-01-10 LAB — ACID FAST SMEAR (AFB, MYCOBACTERIA): Acid Fast Smear: NEGATIVE

## 2021-01-10 LAB — BODY FLUID CULTURE W GRAM STAIN
Culture: NO GROWTH
Gram Stain: NONE SEEN

## 2021-01-10 LAB — MAGNESIUM: Magnesium: 1.5 mg/dL — ABNORMAL LOW (ref 1.7–2.4)

## 2021-01-10 LAB — PHOSPHORUS: Phosphorus: 2.6 mg/dL (ref 2.5–4.6)

## 2021-01-10 MED ORDER — MAGNESIUM SULFATE 2 GM/50ML IV SOLN
2.0000 g | Freq: Once | INTRAVENOUS | Status: AC
  Administered 2021-01-10: 2 g via INTRAVENOUS
  Filled 2021-01-10: qty 50

## 2021-01-10 MED ORDER — MAGNESIUM OXIDE 400 (241.3 MG) MG PO TABS
400.0000 mg | ORAL_TABLET | Freq: Two times a day (BID) | ORAL | Status: DC
  Administered 2021-01-10 – 2021-01-14 (×9): 400 mg via ORAL
  Filled 2021-01-10 (×9): qty 1

## 2021-01-10 NOTE — Care Management Important Message (Signed)
Important Message  Patient Details IM Letter given to the Patient. Name: Mia [name] MRN: [id number] Date of Birth: Jul 22, 1956   Medicare Important Message Given:  Yes     Kerin Salen 01/10/2021, 10:27 AM

## 2021-01-10 NOTE — Progress Notes (Addendum)
PROGRESS NOTE  [name] CHE:[id number] DOB: 1956-03-03 DOA: 01/05/2021 PCP: Ronnald Nian, DO   LOS: 5 days   Brief narrative:   Mia [name] is a 65 y.o. female with medical history significant for stage IV metastatic breast cancer to lung and bones s/p right mastectomy, radiation on chemotherapy, history of malignant pleural effusion requiring thoracentesis, essential hypertension, insulin-dependent type 2 diabetes presented to hospital with difficulty urinating, shortness of breath and worsening tremors.    She had thoracentesis on 11/21/2020 with 1.5 L malignant effusion removed. She also notes chronic right-sided shoulder pain that started in January 2022 and has progressively gotten to the point where she can no longer lift her arm.    In the ED, patient was afebrile, borderline hypotensive, and had oxygen desaturation down to 78% requiring 2 L of O2.  Labs notable for significant neutropenia with WBC of 1, absolute neutrophils 0.3.  Hemoglobin 10.4, HCT of 33.7, Na of 128, Creatinine of 2.03 from 1.24.  Chest x-ray showed complete opacification of the left hemothorax.  Patient was then admitted to hospital for further evaluation and treatment.  During hospitalization, patient persisted to have right upper extremity pain.  MRI of the shoulder and humerus was obtained which showed pathological fracture with large tumor burden.  Orthopedics was consulted in currently awaiting for IM nailing on 01/11/2021.  Assessment/Plan:  Principal Problem:   Acute respiratory failure with hypoxia (HCC) Active Problems:   Metastatic breast cancer ([place])   Bone metastases ([place])   Lung metastases ([place])   Peripheral neuropathy due to chemotherapy ([place])   Diabetes mellitus type 2 in obese (HCC)   Pleural effusion   Hypotension   AKI (acute kidney injury) ([place])   Hyponatremia   Neutropenia (HCC)   Shoulder pain, right  Right shoulder pain/pathologic fracture of proximal right humerus  with angulation  Has known metastatic disease to peri-glenoid region of right scapula back in 2019.   Now noted to have new humerus pathological fracture.  MRI of the right shoulder showed around 8 cm metastatic lesion over the proximal humerus with pathological fracture. Dr. Griffin Basil orthopedics on board, plan for IM nailing on 01/11/2021.  Continue sling.  Focus on analgesia.  Continue Lidoderm patch.  Currently on OxyContin and oxycodone for pain relief.  Acute hypoxic respiratory failure secondary to malignant left pleural effusion Symptoms of dyspnea and shortness of breath has improved after thoracocentesis.  Status post ultrasound-guided thoracocentesis with 1.5 L of fluid removal.  22..  Fluid culture  negative so far.  Cytology of the fluid shows malignant cells consistent with metastatic adenocarcinoma.  Repeat chest x-ray from 01/10/2021 shows 5 patient fixation of the most of the left hemithorax.  Patient might need repeat thoracocentesis at some point.  Neutropenia Has improved overall.  Initially received empiric antibiotic.  Currently off antibiotic.  Afebrile.  WBC at 4.6 hemoglobin 9.1  Anemia likely secondary to malignancy.  Status post 1 unit of packed RBC.  Hemoglobin improved after transfusion.  Hemoglobin of 9.1 after transfusion.  Hypotension Initially on presentation.  Long-acting metoprolol has been resumed.  Blood pressure is stable at this time.  Hyponatremia Mild.  We will continue to monitor closely.  Hypomagnesemia.  Mild.  We will continue to replenish orally and through IV.  Check levels in a.m.  Acute kidney injury. Improved after IV fluids.  Creatinine of 0.8.  Losartan on hold.  History of stage IV metastatic breast cancer to lungs and bones Follows with oncologist Dr. [name].  Oncology has seen during hospitalization but  Chemo-induced peripheral neuropathy Continue gabapentin  Type 2 diabetes mellitus Continue to hold Actos and Janumet.  Continue  moderate sliding scale insulin, , Accu-Cheks for now.    Hyperlipidemia Continue statin, fenofibrate   DVT prophylaxis: SCDs Start: 01/05/21 2046   Code Status: Full code  Family Communication:  I again spoke with the patient's husband at bedside.  Status is: Inpatient  Remains inpatient appropriate because:IV treatments appropriate due to intensity of illness or inability to take PO and Inpatient level of care appropriate due to severity of illness, narcotics for pain relief, plan for IM nailing of the right humerus on 01/11/2021   Dispo: The patient is from: Home              Anticipated d/c is to: Home in 2 to 3 days               Patient currently is not medically stable to d/c.   Difficult to place patient No  Consultants:  Oncology  Orthopedics  Procedures:  Status post thoracocentesis on 01/06/2021.  Anti-infectives:  . Cefepime 4/7>4/10  Anti-infectives (From admission, onward)   Start     Dose/Rate Route Frequency Ordered Stop   01/06/21 1400  ceFEPIme (MAXIPIME) 2 g in sodium chloride 0.9 % 100 mL IVPB        2 g 200 mL/hr over 30 Minutes Intravenous Every 12 hours 01/06/21 0807 01/08/21 1510   01/05/21 2100  vancomycin (VANCOREADY) IVPB 1500 mg/300 mL        1,500 mg 150 mL/hr over 120 Minutes Intravenous  Once 01/05/21 2055 01/06/21 0305   01/05/21 2100  ceFEPIme (MAXIPIME) 2 g in sodium chloride 0.9 % 100 mL IVPB        2 g 200 mL/hr over 30 Minutes Intravenous  Once 01/05/21 2055 01/06/21 0355     Subjective: Today, patient was seen and examined at bedside.  Denies any worsening shortness of breath.  Has baseline mild shoulder pain but controlled.  Patient is a difficult IV access.  Will likely need midline.  Objective: Vitals:   01/09/21 2012 01/10/21 0453  BP: 115/87 123/83  Pulse: (!) 107 96  Resp: 18 18  Temp: 98.3 F (36.8 C) 98.2 F (36.8 C)  SpO2: 98% 98%    Intake/Output Summary (Last 24 hours) at 01/10/2021 0748 Last data filed at  01/10/2021 0530 Gross per 24 hour  Intake --  Output 1100 ml  Net -1100 ml   Filed Weights   01/05/21 1508  Weight: 99.3 kg   Body mass index is 37.59 kg/m.   Physical Exam:  General:   not in obvious distress, obese, on nasal cannula oxygen HENT: Mild pallor noted.. Oral mucosa is moist.  Chest: .  Diminished breath sounds bilaterally.  Improved aeration on the left side. CVS: S1 &S2 heard. No murmur.  Regular rate and rhythm. Abdomen: Soft, nontender, nondistended.  Bowel sounds are heard.   Extremities: No cyanosis, clubbing, trace lower extremity edema.  Right upper extremity in a sling.   Psych: Alert, awake and oriented, normal mood CNS:  No cranial nerve deficits.  Power equal in all extremities.   Skin: Warm and dry.  No rashes noted.  Data Review: I have personally reviewed the following laboratory data and studies,  CBC: Recent Labs  Lab 01/05/21 1911 01/06/21 0134 01/07/21 0441 01/08/21 0557 01/09/21 0543  WBC 1.0* 1.4* 1.5* 2.3* 3.4*  NEUTROABS 0.3*  --  0.2*  0.1* 1.1*  HGB 10.4* 7.9* 6.5* 8.4* 8.7*  HCT 33.7* 25.8* 21.2* 26.5* 27.6*  MCV 100.0 101.6* 100.5* 96.0 96.8  PLT 418* 456* 577* 654* 073*   Basic Metabolic Panel: Recent Labs  Lab 01/05/21 1911 01/06/21 0134 01/07/21 0441 01/08/21 0557 01/10/21 0434  NA 128* 129* 134* 138 133*  K 5.0 4.8 5.0 5.1 4.9  CL 92* 94* 98 102 97*  CO2 28 26 29 25 29   GLUCOSE 107* 117* 98 98 100*  BUN 33* 31* 21 18 12   CREATININE 2.03* 1.72* 1.21* 1.00 0.87  CALCIUM 8.8* 8.9 8.5* 8.4* 8.7*  MG  --   --  1.8 1.7 1.5*  PHOS  --   --  2.4* 3.5 2.6   Liver Function Tests: Recent Labs  Lab 01/05/21 1911 01/08/21 0557  AST 17 24  ALT 10 9  ALKPHOS 103 83  BILITOT 0.5 0.9  PROT 6.0* 5.2*  ALBUMIN 2.7* 2.2*   No results for input(s): LIPASE, AMYLASE in the last 168 hours. No results for input(s): AMMONIA in the last 168 hours. Cardiac Enzymes: No results for input(s): CKTOTAL, CKMB, CKMBINDEX, TROPONINI in  the last 168 hours. BNP (last 3 results) No results for input(s): BNP in the last 8760 hours.  ProBNP (last 3 results) No results for input(s): PROBNP in the last 8760 hours.  CBG: Recent Labs  Lab 01/09/21 0748 01/09/21 1124 01/09/21 1642 01/09/21 2013 01/10/21 0743  GLUCAP 102* 175* 93 141* 115*   Recent Results (from the past 240 hour(s))  SARS CORONAVIRUS 2 (TAT 6-24 HRS) Nasopharyngeal Nasopharyngeal Swab     Status: None   Collection Time: 01/03/21 12:53 PM   Specimen: Nasopharyngeal Swab  Result Value Ref Range Status   SARS Coronavirus 2 NEGATIVE NEGATIVE Final    Comment: (NOTE) SARS-CoV-2 target nucleic acids are NOT DETECTED.  The SARS-CoV-2 RNA is generally detectable in upper and lower respiratory specimens during the acute phase of infection. Negative results do not preclude SARS-CoV-2 infection, do not rule out co-infections with other pathogens, and should not be used as the sole basis for treatment or other patient management decisions. Negative results must be combined with clinical observations, patient history, and epidemiological information. The expected result is Negative.  Fact Sheet for Patients: SugarRoll.be  Fact Sheet for Healthcare Providers: https://www.woods-mathews.com/  This test is not yet approved or cleared by the [place] FDA and  has been authorized for detection and/or diagnosis of SARS-CoV-2 by FDA under an Emergency Use Authorization (EUA). This EUA will remain  in effect (meaning this test can be used) for the duration of the COVID-19 declaration under Se ction 564(b)(1) of the Act, 21 U.S.C. section 360bbb-3(b)(1), unless the authorization is terminated or revoked sooner.  Performed at [facility], [place] [address], [place], [place] [address]   Resp Panel by RT-PCR (Flu A&B, Covid) Nasopharyngeal Swab     Status: None   Collection Time: 01/05/21  4:20 PM   Specimen:  Nasopharyngeal Swab; Nasopharyngeal(NP) swabs in vial transport medium  Result Value Ref Range Status   SARS Coronavirus 2 by RT PCR NEGATIVE NEGATIVE Final    Comment: (NOTE) SARS-CoV-2 target nucleic acids are NOT DETECTED.  The SARS-CoV-2 RNA is generally detectable in upper respiratory specimens during the acute phase of infection. The lowest concentration of SARS-CoV-2 viral copies this assay can detect is 138 copies/mL. A negative result does not preclude SARS-Cov-2 infection and should not be used as the sole basis for  treatment or other patient management decisions. A negative result may occur with  improper specimen collection/handling, submission of specimen other than nasopharyngeal swab, presence of viral mutation(s) within the areas targeted by this assay, and inadequate number of viral copies(<138 copies/mL). A negative result must be combined with clinical observations, patient history, and epidemiological information. The expected result is Negative.  Fact Sheet for Patients:  EntrepreneurPulse.com.au  Fact Sheet for Healthcare Providers:  IncredibleEmployment.be  This test is no t yet approved or cleared by the [place] FDA and  has been authorized for detection and/or diagnosis of SARS-CoV-2 by FDA under an Emergency Use Authorization (EUA). This EUA will remain  in effect (meaning this test can be used) for the duration of the COVID-19 declaration under Section 564(b)(1) of the Act, 21 U.S.C.section 360bbb-3(b)(1), unless the authorization is terminated  or revoked sooner.       Influenza A by PCR NEGATIVE NEGATIVE Final   Influenza B by PCR NEGATIVE NEGATIVE Final    Comment: (NOTE) The Xpert Xpress SARS-CoV-2/FLU/RSV plus assay is intended as an aid in the diagnosis of influenza from Nasopharyngeal swab specimens and should not be used as a sole basis for treatment. Nasal washings and aspirates are unacceptable for  Xpert Xpress SARS-CoV-2/FLU/RSV testing.  Fact Sheet for Patients: EntrepreneurPulse.com.au  Fact Sheet for Healthcare Providers: IncredibleEmployment.be  This test is not yet approved or cleared by the [place] FDA and has been authorized for detection and/or diagnosis of SARS-CoV-2 by FDA under an Emergency Use Authorization (EUA). This EUA will remain in effect (meaning this test can be used) for the duration of the COVID-19 declaration under Section 564(b)(1) of the Act, 21 U.S.C. section 360bbb-3(b)(1), unless the authorization is terminated or revoked.  Performed at [facility], [place] [address] [place], [place] [address]   Urine culture     Status: Abnormal   Collection Time: 01/05/21 10:00 PM   Specimen: Urine, Random  Result Value Ref Range Status   Specimen Description   Final    URINE, RANDOM Performed at [place] [address], [place],  [address]    Special Requests   Final    NONE Performed at [facility], [place] [address] [place], [place] [address]    Culture 20,000 COLONIES/mL ESCHERICHIA COLI (A)  Final   Report Status 01/08/2021 FINAL  Final   Organism ID, Bacteria ESCHERICHIA COLI (A)  Final      Susceptibility   Escherichia coli - MIC*    AMPICILLIN <=2 SENSITIVE Sensitive     CEFAZOLIN <=4 SENSITIVE Sensitive     CEFEPIME <=0.12 SENSITIVE Sensitive     CEFTRIAXONE <=0.25 SENSITIVE Sensitive     CIPROFLOXACIN <=0.25 SENSITIVE Sensitive     GENTAMICIN <=1 SENSITIVE Sensitive     IMIPENEM <=0.25 SENSITIVE Sensitive     NITROFURANTOIN <=16 SENSITIVE Sensitive     TRIMETH/SULFA <=20 SENSITIVE Sensitive     AMPICILLIN/SULBACTAM <=2 SENSITIVE Sensitive     PIP/TAZO <=4 SENSITIVE Sensitive     * 20,000 COLONIES/mL ESCHERICHIA COLI  Culture, blood (routine x 2)     Status: None (Preliminary result)   Collection Time: 01/06/21  1:34 AM   Specimen:  BLOOD  Result Value Ref Range Status   Specimen Description   Final    BLOOD BLOOD LEFT HAND Performed at [facility] [address], [place],  [address]    Special Requests   Final    BOTTLES DRAWN  AEROBIC ONLY Blood Culture adequate volume Performed at [facility] [address], [place], [place] [address]    Culture   Final    NO GROWTH 3 DAYS Performed at [facility], [place] [address], [place], [place] [address]    Report Status PENDING  Incomplete  Culture, blood (routine x 2)     Status: None (Preliminary result)   Collection Time: 01/06/21  1:34 AM   Specimen: BLOOD  Result Value Ref Range Status   Specimen Description   Final    BLOOD BLOOD LEFT WRIST Performed at [facility] [address] [place], [place] [address]    Special Requests   Final    BOTTLES DRAWN AEROBIC ONLY Blood Culture adequate volume Performed at [place] [address], [place], [place] [address]    Culture   Final    NO GROWTH 3 DAYS Performed at [facility], [place] [address], [place], [place] [address]    Report Status PENDING  Incomplete  MRSA PCR Screening     Status: None   Collection Time: 01/06/21  6:22 AM   Specimen: Nasal Mucosa; Nasopharyngeal  Result Value Ref Range Status   MRSA by PCR NEGATIVE NEGATIVE Final    Comment:        The GeneXpert MRSA Assay (FDA approved for NASAL specimens only), is one component of a comprehensive MRSA colonization surveillance program. It is not intended to diagnose MRSA infection nor to guide or monitor treatment for MRSA infections. Performed at [facility], [place] [address], [place], [place] [address]   Body fluid culture w Gram Stain     Status: None (Preliminary result)   Collection Time: 01/06/21  1:03 PM   Specimen: PATH Cytology Pleural fluid  Result Value Ref Range Status   Specimen Description   Final     PLEURAL Performed at [facility] [address], [place], [place] [address]    Special Requests   Final    NONE Performed at [facility], [place] [address] [place], [place] [address]    Gram Stain NO WBC SEEN NO ORGANISMS SEEN   Final   Culture   Final    NO GROWTH 3 DAYS Performed at [facility], [place] [address] [place], [place] [address]    Report Status PENDING  Incomplete  Surgical PCR screen     Status: None   Collection Time: 01/08/21  5:23 PM   Specimen: Nasal Mucosa; Nasal Swab  Result Value Ref Range Status   MRSA, PCR NEGATIVE NEGATIVE Final   Staphylococcus aureus NEGATIVE NEGATIVE Final    Comment: (NOTE) The Xpert SA Assay (FDA approved for NASAL specimens in patients 86 years of age and older), is one component of a comprehensive surveillance program. It is not intended to diagnose infection nor to guide or monitor treatment. Performed at [facility], [place] [address], [place], [place] [address]      Studies: No results found.    Flora Lipps, MD  [facility] 01/10/2021  If 7PM-7AM, please contact night-coverage

## 2021-01-10 NOTE — Care Management Important Message (Deleted)
Important Message  Patient Details IM Letter given to the Patient. Name: Mia [name] MRN: [id number] Date of Birth: 10-10-1955   Medicare Important Message Given:  Yes     Kerin Salen 01/10/2021, 10:31 AM

## 2021-01-10 NOTE — Progress Notes (Signed)
Assessed patient for midline, no suitable vein  found for midline.Patient L upper and forearm is so swollen/ edematous.RN aware.

## 2021-01-11 ENCOUNTER — Encounter (HOSPITAL_COMMUNITY): Payer: Self-pay | Admitting: Family Medicine

## 2021-01-11 ENCOUNTER — Encounter (HOSPITAL_COMMUNITY)
Admission: EM | Disposition: A | Discharge status: Home Health | Payer: Self-pay | Source: Home / Self Care | Attending: Internal Medicine

## 2021-01-11 ENCOUNTER — Inpatient Hospital Stay (HOSPITAL_COMMUNITY): Payer: MEDICARE

## 2021-01-11 ENCOUNTER — Inpatient Hospital Stay (HOSPITAL_COMMUNITY): Payer: MEDICARE | Admitting: Anesthesiology

## 2021-01-11 DIAGNOSIS — J9601 Acute respiratory failure with hypoxia: Secondary | ICD-10-CM | POA: Diagnosis not present

## 2021-01-11 DIAGNOSIS — N179 Acute kidney failure, unspecified: Secondary | ICD-10-CM | POA: Diagnosis not present

## 2021-01-11 DIAGNOSIS — C7951 Secondary malignant neoplasm of bone: Secondary | ICD-10-CM | POA: Diagnosis not present

## 2021-01-11 DIAGNOSIS — J90 Pleural effusion, not elsewhere classified: Secondary | ICD-10-CM | POA: Diagnosis not present

## 2021-01-11 HISTORY — PX: HUMERUS IM NAIL: SHX1769

## 2021-01-11 LAB — CULTURE, BLOOD (ROUTINE X 2)
Culture: NO GROWTH
Culture: NO GROWTH
Special Requests: ADEQUATE
Special Requests: ADEQUATE

## 2021-01-11 LAB — CBC WITH DIFFERENTIAL/PLATELET
Abs Immature Granulocytes: 0.9 10*3/uL — ABNORMAL HIGH (ref 0.00–0.07)
Band Neutrophils: 2 %
Basophils Absolute: 0.1 10*3/uL (ref 0.0–0.1)
Basophils Relative: 2 %
Eosinophils Absolute: 0.3 10*3/uL (ref 0.0–0.5)
Eosinophils Relative: 5 %
HCT: 28.9 % — ABNORMAL LOW (ref 36.0–46.0)
Hemoglobin: 8.6 g/dL — ABNORMAL LOW (ref 12.0–15.0)
Lymphocytes Relative: 8 %
Lymphs Abs: 0.4 10*3/uL — ABNORMAL LOW (ref 0.7–4.0)
MCH: 29.8 pg (ref 26.0–34.0)
MCHC: 29.8 g/dL — ABNORMAL LOW (ref 30.0–36.0)
MCV: 100 fL (ref 80.0–100.0)
Metamyelocytes Relative: 3 %
Monocytes Absolute: 1.5 10*3/uL — ABNORMAL HIGH (ref 0.1–1.0)
Monocytes Relative: 28 %
Myelocytes: 14 %
Neutro Abs: 2.2 10*3/uL (ref 1.7–7.7)
Neutrophils Relative %: 38 %
Platelets: 632 10*3/uL — ABNORMAL HIGH (ref 150–400)
RBC: 2.89 MIL/uL — ABNORMAL LOW (ref 3.87–5.11)
RDW: 17.4 % — ABNORMAL HIGH (ref 11.5–15.5)
WBC: 5.5 10*3/uL (ref 4.0–10.5)
nRBC: 0 % (ref 0.0–0.2)

## 2021-01-11 LAB — GLUCOSE, CAPILLARY
Glucose-Capillary: 96 mg/dL (ref 70–99)
Glucose-Capillary: 101 mg/dL — ABNORMAL HIGH (ref 70–99)
Glucose-Capillary: 168 mg/dL — ABNORMAL HIGH (ref 70–99)

## 2021-01-11 LAB — TYPE AND SCREEN
ABO/RH(D): A NEG
Antibody Screen: NEGATIVE

## 2021-01-11 LAB — BASIC METABOLIC PANEL
Anion gap: 9 (ref 5–15)
BUN: 10 mg/dL (ref 8–23)
CO2: 29 mmol/L (ref 22–32)
Calcium: 9.2 mg/dL (ref 8.9–10.3)
Chloride: 98 mmol/L (ref 98–111)
Creatinine, Ser: 0.97 mg/dL (ref 0.44–1.00)
GFR, Estimated: >60 mL/min (ref >60)
Glucose, Bld: 114 mg/dL — ABNORMAL HIGH (ref 70–99)
Potassium: 4.4 mmol/L (ref 3.5–5.1)
Sodium: 136 mmol/L (ref 135–145)

## 2021-01-11 LAB — MAGNESIUM: Magnesium: 1.8 mg/dL (ref 1.7–2.4)

## 2021-01-11 SURGERY — INSERTION, INTRAMEDULLARY ROD, HUMERUS
Anesthesia: General | Laterality: Right

## 2021-01-11 MED ORDER — ROCURONIUM BROMIDE 10 MG/ML (PF) SYRINGE
PREFILLED_SYRINGE | INTRAVENOUS | Status: DC | PRN
  Administered 2021-01-11: 60 mg via INTRAVENOUS

## 2021-01-11 MED ORDER — CHLORHEXIDINE GLUCONATE 0.12 % MT SOLN
15.0000 mL | Freq: Once | OROMUCOSAL | Status: AC
  Administered 2021-01-11: 15 mL via OROMUCOSAL

## 2021-01-11 MED ORDER — ONDANSETRON HCL 4 MG/2ML IJ SOLN
INTRAMUSCULAR | Status: DC | PRN
  Administered 2021-01-11: 4 mg via INTRAVENOUS

## 2021-01-11 MED ORDER — SUGAMMADEX SODIUM 200 MG/2ML IV SOLN
INTRAVENOUS | Status: DC | PRN
  Administered 2021-01-11: 200 mg via INTRAVENOUS

## 2021-01-11 MED ORDER — VANCOMYCIN HCL 1 G IV SOLR
INTRAVENOUS | Status: DC | PRN
  Administered 2021-01-11: 1000 mg via TOPICAL

## 2021-01-11 MED ORDER — ROCURONIUM BROMIDE 10 MG/ML (PF) SYRINGE
PREFILLED_SYRINGE | INTRAVENOUS | Status: AC
  Filled 2021-01-11: qty 10

## 2021-01-11 MED ORDER — VANCOMYCIN HCL 1000 MG IV SOLR
INTRAVENOUS | Status: AC
  Filled 2021-01-11: qty 1000

## 2021-01-11 MED ORDER — LIDOCAINE 2% (20 MG/ML) 5 ML SYRINGE
INTRAMUSCULAR | Status: AC
  Filled 2021-01-11: qty 5

## 2021-01-11 MED ORDER — BUPIVACAINE HCL (PF) 0.5 % IJ SOLN
INTRAMUSCULAR | Status: DC | PRN
  Administered 2021-01-11: 15 mL via PERINEURAL

## 2021-01-11 MED ORDER — FENTANYL CITRATE (PF) 100 MCG/2ML IJ SOLN
INTRAMUSCULAR | Status: AC
  Filled 2021-01-11: qty 2

## 2021-01-11 MED ORDER — LIDOCAINE HCL (CARDIAC) PF 100 MG/5ML IV SOSY
PREFILLED_SYRINGE | INTRAVENOUS | Status: DC | PRN
  Administered 2021-01-11: 100 mg via INTRAVENOUS

## 2021-01-11 MED ORDER — FENTANYL CITRATE (PF) 100 MCG/2ML IJ SOLN
50.0000 ug | INTRAMUSCULAR | Status: DC
  Administered 2021-01-11: 50 ug via INTRAVENOUS
  Filled 2021-01-11: qty 2

## 2021-01-11 MED ORDER — CEFAZOLIN SODIUM-DEXTROSE 2-4 GM/100ML-% IV SOLN
2.0000 g | INTRAVENOUS | Status: AC
  Administered 2021-01-11: 2 g via INTRAVENOUS
  Filled 2021-01-11: qty 100

## 2021-01-11 MED ORDER — LACTATED RINGERS IV SOLN: INTRAVENOUS | Status: DC

## 2021-01-11 MED ORDER — ONDANSETRON HCL 4 MG/2ML IJ SOLN: 4.0000 mg | Freq: Once | INTRAMUSCULAR | Status: DC | PRN

## 2021-01-11 MED ORDER — ORAL CARE MOUTH RINSE: 15.0000 mL | Freq: Once | OROMUCOSAL | Status: AC

## 2021-01-11 MED ORDER — PROPOFOL 10 MG/ML IV BOLUS
INTRAVENOUS | Status: DC | PRN
  Administered 2021-01-11: 70 mg via INTRAVENOUS

## 2021-01-11 MED ORDER — PROPOFOL 10 MG/ML IV BOLUS
INTRAVENOUS | Status: AC
  Filled 2021-01-11: qty 20

## 2021-01-11 MED ORDER — 0.9 % SODIUM CHLORIDE (POUR BTL) OPTIME
TOPICAL | Status: DC | PRN
  Administered 2021-01-11: 1000 mL

## 2021-01-11 MED ORDER — PHENYLEPHRINE 40 MCG/ML (10ML) SYRINGE FOR IV PUSH (FOR BLOOD PRESSURE SUPPORT)
PREFILLED_SYRINGE | INTRAVENOUS | Status: DC | PRN
  Administered 2021-01-11: 160 ug via INTRAVENOUS

## 2021-01-11 MED ORDER — PHENYLEPHRINE HCL (PRESSORS) 10 MG/ML IV SOLN
INTRAVENOUS | Status: AC
  Filled 2021-01-11: qty 1

## 2021-01-11 MED ORDER — DEXAMETHASONE SODIUM PHOSPHATE 10 MG/ML IJ SOLN
INTRAMUSCULAR | Status: DC | PRN
  Administered 2021-01-11: 5 mg via INTRAVENOUS

## 2021-01-11 MED ORDER — PHENYLEPHRINE HCL-NACL 10-0.9 MG/250ML-% IV SOLN
INTRAVENOUS | Status: DC | PRN
  Administered 2021-01-11: 50 ug/min via INTRAVENOUS

## 2021-01-11 MED ORDER — HYDROMORPHONE HCL 1 MG/ML IJ SOLN: 0.2500 mg | INTRAMUSCULAR | Status: DC | PRN

## 2021-01-11 MED ORDER — MIDAZOLAM HCL 2 MG/2ML IJ SOLN
1.0000 mg | INTRAMUSCULAR | Status: DC
  Administered 2021-01-11: 1 mg via INTRAVENOUS
  Filled 2021-01-11: qty 2

## 2021-01-11 MED ORDER — BUPIVACAINE LIPOSOME 1.3 % IJ SUSP
INTRAMUSCULAR | Status: DC | PRN
  Administered 2021-01-11: 10 mL via PERINEURAL

## 2021-01-11 SURGICAL SUPPLY — 56 items
BAG ZIPLOCK 12X15 (MISCELLANEOUS) ×2 IMPLANT
BIT DRILL 3.8 CALIBRATED STRL (BIT) ×1 IMPLANT
BIT DRILL FLUTED 3.2X145 SHORT (BIT) ×1 IMPLANT
BIT DRILL NL HOLLOW 10 STRL (BIT) ×1 IMPLANT
CLSR STERI-STRIP ANTIMIC 1/2X4 (GAUZE/BANDAGES/DRESSINGS) ×4 IMPLANT
COVER SURGICAL LIGHT HANDLE (MISCELLANEOUS) ×2 IMPLANT
COVER WAND RF STERILE (DRAPES) IMPLANT
DRAPE C-ARM 42X120 X-RAY (DRAPES) ×2 IMPLANT
DRAPE ORTHO SPLIT 77X108 STRL (DRAPES) ×2
DRAPE SHEET LG 3/4 BI-LAMINATE (DRAPES) ×4 IMPLANT
DRAPE SURG ORHT 6 SPLT 77X108 (DRAPES) ×2 IMPLANT
DRAPE U-SHAPE 47X51 STRL (DRAPES) ×2 IMPLANT
DRSG ADAPTIC 3X8 NADH LF (GAUZE/BANDAGES/DRESSINGS) IMPLANT
DRSG AQUACEL AG ADV 3.5X 6 (GAUZE/BANDAGES/DRESSINGS) IMPLANT
DRSG AQUACEL AG ADV 3.5X10 (GAUZE/BANDAGES/DRESSINGS) IMPLANT
DRSG PAD ABDOMINAL 8X10 ST (GAUZE/BANDAGES/DRESSINGS) ×2 IMPLANT
DURAPREP 26ML APPLICATOR (WOUND CARE) ×2 IMPLANT
ELECT REM PT RETURN 15FT ADLT (MISCELLANEOUS) ×2 IMPLANT
GAUZE SPONGE 4X4 12PLY STRL (GAUZE/BANDAGES/DRESSINGS) ×1 IMPLANT
GAUZE XEROFORM 1X8 LF (GAUZE/BANDAGES/DRESSINGS) ×2 IMPLANT
GLOVE SRG 8 PF TXTR STRL LF DI (GLOVE) ×1 IMPLANT
GLOVE SURG ENC MOIS LTX SZ6.5 (GLOVE) ×2 IMPLANT
GLOVE SURG LTX SZ8 (GLOVE) ×4 IMPLANT
GLOVE SURG UNDER POLY LF SZ6.5 (GLOVE) ×2 IMPLANT
GLOVE SURG UNDER POLY LF SZ8 (GLOVE) ×1
GOWN STRL REUS W/TWL LRG LVL3 (GOWN DISPOSABLE) ×4 IMPLANT
K-WIRE TROCAR POINT 2.5X280 (WIRE) ×2
KIT BASIN OR (CUSTOM PROCEDURE TRAY) ×2 IMPLANT
KIT STABILIZATION SHOULDER (MISCELLANEOUS) ×2 IMPLANT
KIT TURNOVER KIT A (KITS) ×2 IMPLANT
KWIRE TROCAR POINT 2.5X280 (WIRE) IMPLANT
NAIL 7TI HUMERAL MULTILOC (Nail) ×1 IMPLANT
NS IRRIG 1000ML POUR BTL (IV SOLUTION) ×2 IMPLANT
PACK SHOULDER (CUSTOM PROCEDURE TRAY) ×2 IMPLANT
PROTECTOR NERVE ULNAR (MISCELLANEOUS) ×2 IMPLANT
RESTRAINT HEAD UNIVERSAL NS (MISCELLANEOUS) ×2 IMPLANT
ROD GUIDE W/STOP 2.5 SRTL (ROD) ×1 IMPLANT
ROD REAMING 2.5 (INSTRUMENTS) ×1 IMPLANT
SCREW LOCKING 4.0 28MM (Screw) ×2 IMPLANT
SCREW MULTILOC 4.5X32 (Screw) ×1 IMPLANT
SCREW MULTILOC 4.5X34 (Screw) ×1 IMPLANT
SCREW MULTILOC 4.5X38 (Screw) ×1 IMPLANT
SLING ARM FOAM STRAP LRG (SOFTGOODS) ×1 IMPLANT
SUT ETHIBOND NAB CT1 #1 30IN (SUTURE) IMPLANT
SUT ETHILON 2 0 PS N (SUTURE) ×4 IMPLANT
SUT FIBERWIRE #2 38 T-5 BLUE (SUTURE) ×2
SUT MNCRL AB 4-0 PS2 18 (SUTURE) ×2 IMPLANT
SUT VIC AB 0 CT1 36 (SUTURE) ×5 IMPLANT
SUT VIC AB 1 CT1 36 (SUTURE) ×2 IMPLANT
SUT VIC AB 2-0 CT1 27 (SUTURE) ×1
SUT VIC AB 2-0 CT1 TAPERPNT 27 (SUTURE) ×1 IMPLANT
SUT VIC AB 3-0 CT1 27 (SUTURE) ×1
SUT VIC AB 3-0 CT1 TAPERPNT 27 (SUTURE) IMPLANT
SUTURE FIBERWR #2 38 T-5 BLUE (SUTURE) IMPLANT
TOWEL OR 17X26 10 PK STRL BLUE (TOWEL DISPOSABLE) ×2 IMPLANT
WATER STERILE IRR 1000ML POUR (IV SOLUTION) ×2 IMPLANT

## 2021-01-11 NOTE — Progress Notes (Signed)
Assisted Dr. Myrtie Soman with Right shoulder  block. Side rails up, monitors on throughout procedure. See vital signs in flow sheet. Tolerated Procedure well.

## 2021-01-11 NOTE — Anesthesia Procedure Notes (Signed)
Anesthesia Regional Block: Interscalene brachial plexus block   Pre-Anesthetic Checklist: ,, timeout performed, Correct Patient, Correct Site, Correct Laterality, Correct Procedure, Correct Position, site marked, Risks and benefits discussed,  Surgical consent,  Pre-op evaluation,  At surgeon's request and post-op pain management  Laterality: Right  Prep: chloraprep       Needles:  Injection technique: Single-shot  Needle Type: Echogenic Needle     Needle Length: 9cm      Additional Needles:   Procedures:,,,, ultrasound used (permanent image in chart),,,,  Narrative:  Start time: 01/11/2021 1:40 PM End time: 01/11/2021 1:49 PM Injection made incrementally with aspirations every 5 mL.  Performed by: Personally  Anesthesiologist: Myrtie Soman, MD  Additional Notes: Patient tolerated the procedure well without complications

## 2021-01-11 NOTE — Anesthesia Procedure Notes (Signed)
Anesthesia Procedure Image    

## 2021-01-11 NOTE — Anesthesia Procedure Notes (Signed)
Procedure Name: Intubation Date/Time: 01/11/2021 2:19 PM Performed by: Raenette Rover, CRNA Pre-anesthesia Checklist: Patient identified, Emergency Drugs available, Suction available and Patient being monitored Patient Re-evaluated:Patient Re-evaluated prior to induction Oxygen Delivery Method: Circle system utilized Preoxygenation: Pre-oxygenation with 100% oxygen Induction Type: IV induction Ventilation: Mask ventilation without difficulty Laryngoscope Size: Mac and 3 Grade View: Grade I Tube type: Oral Tube size: 7.0 mm Number of attempts: 1 Airway Equipment and Method: Stylet Placement Confirmation: ETT inserted through vocal cords under direct vision,  positive ETCO2 and breath sounds checked- equal and bilateral Secured at: 21 cm Tube secured with: Tape Dental Injury: Teeth and Oropharynx as per pre-operative assessment

## 2021-01-11 NOTE — Progress Notes (Signed)
PROGRESS NOTE  Mia [name] KZS:[id number] DOB: [birth year] DOA: 01/05/2021 PCP: Ronnald Nian, DO   LOS: 6 days   Brief narrative:   Mia [name] is a 65 y.o. female with medical history significant for stage IV metastatic breast cancer to lung and bones s/p right mastectomy, radiation on chemotherapy, history of malignant pleural effusion requiring thoracentesis, essential hypertension, insulin-dependent type 2 diabetes presented to hospital with difficulty urinating, shortness of breath and worsening tremors.    She had thoracentesis on 11/21/2020 with 1.5 L malignant effusion removed. She also notes chronic right-sided shoulder pain that started in January 2022 and has progressively gotten to the point where she can no longer lift her arm.    In the ED, patient was afebrile, borderline hypotensive, and had oxygen desaturation down to 78% requiring 2 L of O2.  Labs notable for significant neutropenia with WBC of 1, absolute neutrophils 0.3.  Hemoglobin 10.4, HCT of 33.7, Na of 128, Creatinine of 2.03 from 1.24.  Chest x-ray showed complete opacification of the left hemothorax.  Patient was then admitted to hospital for further evaluation and treatment.  During hospitalization, patient persisted to have right upper extremity pain.  MRI of the shoulder and humerus was obtained which showed pathological fracture with large tumor burden.  Orthopedics was consulted in currently awaiting for IM nailing on 01/11/2021.  Assessment/Plan:  Principal Problem:   Acute respiratory failure with hypoxia (HCC) Active Problems:   Metastatic breast cancer ([place])   Bone metastases ([place])   Lung metastases ([place])   Peripheral neuropathy due to chemotherapy ([place])   Diabetes mellitus type 2 in obese (HCC)   Pleural effusion   Hypotension   AKI (acute kidney injury) ([place])   Hyponatremia   Neutropenia (HCC)   Shoulder pain, right  Right shoulder pain/pathologic fracture of proximal right humerus  with angulation   Has known metastatic disease to peri-glenoid region of right scapula back in 2019.   Now noted to have new humerus pathological fracture.  MRI of the right shoulder showed around 8 cm metastatic lesion over the proximal humerus with pathological fracture. Dr. Griffin Basil orthopedics on board, plan for IM nailing on 01/11/2021.  Continue arm sling.  Focus on analgesia.  Continue Lidoderm patch.  Currently on OxyContin and oxycodone for pain relief.  Pain is under control.  Awaiting for surgical intervention today.  Acute hypoxic respiratory failure secondary to malignant left pleural effusion Symptoms of dyspnea and shortness of breath has improved after thoracocentesis.  Status post ultrasound-guided thoracocentesis with 1.5 L of fluid removal.  22..  Fluid culture  negative so far.  Cytology of the fluid shows malignant cells consistent with metastatic adenocarcinoma.  Repeat chest x-ray from 01/10/2021 shows recommendation of fluid and opacification left hemithorax.  Patient might need repeat thoracocentesis prior to discharge  Neutropenia Resolved at this time.  Significant improvement in WBC count.  5.5.  Anemia likely secondary to malignancy.  Status post 1 unit of packed RBC.  Hemoglobin improved after transfusion.  Hemoglobin of 8.6 today after transfusion.  Hypotension Initially on presentation.  Continue metoprolol.  Blood pressure is stable at this time.  Hyponatremia Resolved.  Latest sodium of 136  Hypomagnesemia.  Mild.  Improved after replacement.  Latest magnesium 1.8  Acute kidney injury. Improved after IV fluids.  Creatinine of 0.9.  Losartan on hold.  History of stage IV metastatic breast cancer to lungs and bones Follows with oncologist Dr. [name].  Oncology has seen during hospitalization and will need outpatient  follow-up after discharge  Chemo-induced peripheral neuropathy Continue gabapentin  Type 2 diabetes mellitus Continue to hold Actos and  Janumet.  Continue moderate sliding scale insulin, , Accu-Cheks for now.  Latest POC glucose of 96  Hyperlipidemia Continue statin, fenofibrate   DVT prophylaxis: SCDs Start: 01/05/21 2046   Code Status: Full code  Family Communication:  I again spoke with the patient's husband at bedside.  Status is: Inpatient  Remains inpatient appropriate because:IV treatments appropriate due to intensity of illness or inability to take PO and Inpatient level of care appropriate due to severity of illness,  plan for IM nailing of the right humerus on 01/11/2021   Dispo: The patient is from: Home              Anticipated d/c is to: Home in 2 to 3 days               Patient currently is not medically stable to d/c.   Difficult to place patient No  Consultants:  Oncology  Orthopedics  Procedures:  Status post thoracocentesis on 01/06/2021.  Anti-infectives:  . Cefepime 4/7>4/10  Anti-infectives (From admission, onward)   Start     Dose/Rate Route Frequency Ordered Stop   01/06/21 1400  ceFEPIme (MAXIPIME) 2 g in sodium chloride 0.9 % 100 mL IVPB        2 g 200 mL/hr over 30 Minutes Intravenous Every 12 hours 01/06/21 0807 01/08/21 1510   01/05/21 2100  vancomycin (VANCOREADY) IVPB 1500 mg/300 mL        1,500 mg 150 mL/hr over 120 Minutes Intravenous  Once 01/05/21 2055 01/06/21 0305   01/05/21 2100  ceFEPIme (MAXIPIME) 2 g in sodium chloride 0.9 % 100 mL IVPB        2 g 200 mL/hr over 30 Minutes Intravenous  Once 01/05/21 2055 01/06/21 0355     Subjective: Today, patient was seen and examined at bedside.  Denies overt pain nausea vomiting or shortness of breath.  Awaiting for surgical intervention today.  Objective: Vitals:   01/10/21 2039 01/11/21 0440  BP: 128/68 121/69  Pulse: 99 92  Resp: 20 20  Temp: 99.2 F (37.3 C) 98.6 F (37 C)  SpO2: 98% 98%    Intake/Output Summary (Last 24 hours) at 01/11/2021 0841 Last data filed at 01/11/2021 0817 Gross per 24 hour  Intake  600 ml  Output 2451 ml  Net -1851 ml   Filed Weights   01/05/21 1508  Weight: 99.3 kg   Body mass index is 37.59 kg/m.   Physical Exam:  General: Obese built, not in obvious distress, on nasal cannula oxygen 2 L/min HENT:   Mild pallor noted.. Oral mucosa is moist.  Chest:   Diminished breath sounds bilaterally mostly on the left side. CVS: S1 &S2 heard. No murmur.  Regular rate and rhythm. Abdomen: Soft, nontender, nondistended.  Bowel sounds are heard.   Extremities: No cyanosis, clubbing but trace bilateral lower extremity edema.  Right upper extremity in a sling.  Peripheral pulses are palpable. Psych: Alert, awake and oriented, normal mood CNS:  No cranial nerve deficits.  Power equal in all extremities.   Skin: Warm and dry.  No rashes noted.  Data Review: I have personally reviewed the following laboratory data and studies,  CBC: Recent Labs  Lab 01/07/21 0441 01/08/21 0557 01/09/21 0543 01/10/21 0902 01/11/21 0454  WBC 1.5* 2.3* 3.4* 4.6 5.5  NEUTROABS 0.2* 0.1* 1.1* 1.4* 2.2  HGB 6.5* 8.4* 8.7* 9.1* 8.6*  HCT 21.2* 26.5* 27.6* 29.9* 28.9*  MCV 100.5* 96.0 96.8 99.7 100.0  PLT 577* 654* 667* 653* 993*   Basic Metabolic Panel: Recent Labs  Lab 01/06/21 0134 01/07/21 0441 01/08/21 0557 01/10/21 0434 01/11/21 0454  NA 129* 134* 138 133* 136  K 4.8 5.0 5.1 4.9 4.4  CL 94* 98 102 97* 98  CO2 26 29 25 29 29   GLUCOSE 117* 98 98 100* 114*  BUN 31* 21 18 12 10   CREATININE 1.72* 1.21* 1.00 0.87 0.97  CALCIUM 8.9 8.5* 8.4* 8.7* 9.2  MG  --  1.8 1.7 1.5* 1.8  PHOS  --  2.4* 3.5 2.6  --    Liver Function Tests: Recent Labs  Lab 01/05/21 1911 01/08/21 0557  AST 17 24  ALT 10 9  ALKPHOS 103 83  BILITOT 0.5 0.9  PROT 6.0* 5.2*  ALBUMIN 2.7* 2.2*   No results for input(s): LIPASE, AMYLASE in the last 168 hours. No results for input(s): AMMONIA in the last 168 hours. Cardiac Enzymes: No results for input(s): CKTOTAL, CKMB, CKMBINDEX, TROPONINI in the  last 168 hours. BNP (last 3 results) No results for input(s): BNP in the last 8760 hours.  ProBNP (last 3 results) No results for input(s): PROBNP in the last 8760 hours.  CBG: Recent Labs  Lab 01/10/21 0743 01/10/21 1148 01/10/21 1714 01/10/21 2036 01/11/21 0742  GLUCAP 115* 165* 144* 114* 96   Recent Results (from the past 240 hour(s))  SARS CORONAVIRUS 2 (TAT 6-24 HRS) Nasopharyngeal Nasopharyngeal Swab     Status: None   Collection Time: 01/03/21 12:53 PM   Specimen: Nasopharyngeal Swab  Result Value Ref Range Status   SARS Coronavirus 2 NEGATIVE NEGATIVE Final    Comment: (NOTE) SARS-CoV-2 target nucleic acids are NOT DETECTED.  The SARS-CoV-2 RNA is generally detectable in upper and lower respiratory specimens during the acute phase of infection. Negative results do not preclude SARS-CoV-2 infection, do not rule out co-infections with other pathogens, and should not be used as the sole basis for treatment or other patient management decisions. Negative results must be combined with clinical observations, patient history, and epidemiological information. The expected result is Negative.  Fact Sheet for Patients: SugarRoll.be  Fact Sheet for Healthcare Providers: https://www.woods-mathews.com/  This test is not yet approved or cleared by the [place] FDA and  has been authorized for detection and/or diagnosis of SARS-CoV-2 by FDA under an Emergency Use Authorization (EUA). This EUA will remain  in effect (meaning this test can be used) for the duration of the COVID-19 declaration under Se ction 564(b)(1) of the Act, 21 U.S.C. section 360bbb-3(b)(1), unless the authorization is terminated or revoked sooner.  Performed at [facility], [place] [address], [place], [place] [address]   Resp Panel by RT-PCR (Flu A&B, Covid) Nasopharyngeal Swab     Status: None   Collection Time: 01/05/21  4:20 PM   Specimen:  Nasopharyngeal Swab; Nasopharyngeal(NP) swabs in vial transport medium  Result Value Ref Range Status   SARS Coronavirus 2 by RT PCR NEGATIVE NEGATIVE Final    Comment: (NOTE) SARS-CoV-2 target nucleic acids are NOT DETECTED.  The SARS-CoV-2 RNA is generally detectable in upper respiratory specimens during the acute phase of infection. The lowest concentration of SARS-CoV-2 viral copies this assay can detect is 138 copies/mL. A negative result does not preclude SARS-Cov-2 infection and should not be used as the sole basis for treatment or other patient management decisions. A negative result may occur with  improper specimen collection/handling, submission of specimen other than nasopharyngeal swab, presence of viral mutation(s) within the areas targeted by this assay, and inadequate number of viral copies(<138 copies/mL). A negative result must be combined with clinical observations, patient history, and epidemiological information. The expected result is Negative.  Fact Sheet for Patients:  EntrepreneurPulse.com.au  Fact Sheet for Healthcare Providers:  IncredibleEmployment.be  This test is no t yet approved or cleared by the [place] FDA and  has been authorized for detection and/or diagnosis of SARS-CoV-2 by FDA under an Emergency Use Authorization (EUA). This EUA will remain  in effect (meaning this test can be used) for the duration of the COVID-19 declaration under Section 564(b)(1) of the Act, 21 U.S.C.section 360bbb-3(b)(1), unless the authorization is terminated  or revoked sooner.       Influenza A by PCR NEGATIVE NEGATIVE Final   Influenza B by PCR NEGATIVE NEGATIVE Final    Comment: (NOTE) The Xpert Xpress SARS-CoV-2/FLU/RSV plus assay is intended as an aid in the diagnosis of influenza from Nasopharyngeal swab specimens and should not be used as a sole basis for treatment. Nasal washings and aspirates are unacceptable for  Xpert Xpress SARS-CoV-2/FLU/RSV testing.  Fact Sheet for Patients: EntrepreneurPulse.com.au  Fact Sheet for Healthcare Providers: IncredibleEmployment.be  This test is not yet approved or cleared by the [place] FDA and has been authorized for detection and/or diagnosis of SARS-CoV-2 by FDA under an Emergency Use Authorization (EUA). This EUA will remain in effect (meaning this test can be used) for the duration of the COVID-19 declaration under Section 564(b)(1) of the Act, 21 U.S.C. section 360bbb-3(b)(1), unless the authorization is terminated or revoked.  Performed at [facility], [place] [address], [place], [place] [address]   Urine culture     Status: Abnormal   Collection Time: 01/05/21 10:00 PM   Specimen: Urine, Random  Result Value Ref Range Status   Specimen Description   Final    URINE, RANDOM Performed at [facility] [address] [place], [place] [address]    Special Requests   Final    NONE Performed at [facility], [place] [address] [place], [place] [address]    Culture 20,000 COLONIES/mL ESCHERICHIA COLI (A)  Final   Report Status 01/08/2021 FINAL  Final   Organism ID, Bacteria ESCHERICHIA COLI (A)  Final      Susceptibility   Escherichia coli - MIC*    AMPICILLIN <=2 SENSITIVE Sensitive     CEFAZOLIN <=4 SENSITIVE Sensitive     CEFEPIME <=0.12 SENSITIVE Sensitive     CEFTRIAXONE <=0.25 SENSITIVE Sensitive     CIPROFLOXACIN <=0.25 SENSITIVE Sensitive     GENTAMICIN <=1 SENSITIVE Sensitive     IMIPENEM <=0.25 SENSITIVE Sensitive     NITROFURANTOIN <=16 SENSITIVE Sensitive     TRIMETH/SULFA <=20 SENSITIVE Sensitive     AMPICILLIN/SULBACTAM <=2 SENSITIVE Sensitive     PIP/TAZO <=4 SENSITIVE Sensitive     * 20,000 COLONIES/mL ESCHERICHIA COLI  Culture, blood (routine x 2)     Status: None   Collection Time: 01/06/21  1:34 AM   Specimen: BLOOD  Result Value  Ref Range Status   Specimen Description   Final    BLOOD BLOOD LEFT HAND Performed at [facility] [address], [place], [place] [address]    Special Requests   Final    BOTTLES DRAWN AEROBIC ONLY Blood Culture adequate volume Performed at [facility]  [address] [place], [place] [address]    Culture   Final    NO GROWTH 5 DAYS Performed at [facility], [place] [address] [place], [place] [address]    Report Status 01/11/2021 FINAL  Final  Culture, blood (routine x 2)     Status: None   Collection Time: 01/06/21  1:34 AM   Specimen: BLOOD  Result Value Ref Range Status   Specimen Description   Final    BLOOD BLOOD LEFT WRIST Performed at [facility] [address], [place], [place] [address]    Special Requests   Final    BOTTLES DRAWN AEROBIC ONLY Blood Culture adequate volume Performed at [facility] [address] [place], [place] [address]    Culture   Final    NO GROWTH 5 DAYS Performed at [facility], [place] [address], [place], [place] [address]    Report Status 01/11/2021 FINAL  Final  MRSA PCR Screening     Status: None   Collection Time: 01/06/21  6:22 AM   Specimen: Nasal Mucosa; Nasopharyngeal  Result Value Ref Range Status   MRSA by PCR NEGATIVE NEGATIVE Final    Comment:        The GeneXpert MRSA Assay (FDA approved for NASAL specimens only), is one component of a comprehensive MRSA colonization surveillance program. It is not intended to diagnose MRSA infection nor to guide or monitor treatment for MRSA infections. Performed at [facility], [place] [address], [place], [place] [address]   Acid Fast Smear (AFB)     Status: None   Collection Time: 01/06/21  1:03 PM   Specimen: PATH Cytology Pleural fluid  Result Value Ref Range Status   AFB Specimen Processing Concentration  Final   Acid Fast Smear Negative  Final    Comment:  (NOTE) Performed At: [facility] [place], [place] [id number] Rush Farmer MD [id number]    Source (AFB) PLEURAL  Final    Comment: Performed at [facility], [place] [address], [place], [place] [address]  Body fluid culture w Gram Stain     Status: None   Collection Time: 01/06/21  1:03 PM   Specimen: PATH Cytology Pleural fluid  Result Value Ref Range Status   Specimen Description   Final    PLEURAL Performed at [facility] [address] [place], [place] [address]    Special Requests   Final    NONE Performed at [facility], [place] [address], [place], [place] [address]    Gram Stain NO WBC SEEN NO ORGANISMS SEEN   Final   Culture   Final    NO GROWTH 3 DAYS Performed at [facility], [place] [address], [place], [place] [address]    Report Status 01/10/2021 FINAL  Final  Surgical PCR screen     Status: None   Collection Time: 01/08/21  5:23 PM   Specimen: Nasal Mucosa; Nasal Swab  Result Value Ref Range Status   MRSA, PCR NEGATIVE NEGATIVE Final   Staphylococcus aureus NEGATIVE NEGATIVE Final    Comment: (NOTE) The Xpert SA Assay (FDA approved for NASAL specimens in patients 41 years of age and older), is one component of a comprehensive surveillance program. It is not intended to diagnose infection nor to guide or monitor treatment. Performed at [facility], [place] [address] [place], [place] [address]      Studies: DG CHEST  PORT 1 VIEW  Result Date: 01/10/2021 CLINICAL DATA:  Pleural effusion with cough.  Breast carcinoma EXAM: PORTABLE CHEST 1 VIEW COMPARISON:  January 06, 2021 chest radiograph and right humerus radiographs FINDINGS: Most of the left hemithorax remains opacified, likely due to combination of pleural effusion and atelectasis/consolidation throughout most of the left lung. Mild aeration remains in a portion of the left upper lobe. The right lung is  clear. Heart is prominent, stable, with normal appearing pulmonary vascularity on the right. Pulmonary vascularity on the left is obscured by apparent effusion and infiltrate. Surgical clips noted in the right axilla. Ill-defined mixed sclerotic and lytic changes noted in the left glenoid region. Apparent pathologic fracture proximal right humerus again noted. IMPRESSION: Opacification of most of the left hemithorax, likely due to combination of sizable pleural effusion and areas of atelectasis/consolidation. Right lung clear. Grossly stable cardiac silhouette. Bony metastases noted. Apparent pathologic fracture proximal right humerus, documented previously and not significantly changed. Surgical clips in right axilla. Electronically Signed   By: Lowella Grip III M.D.   On: 01/10/2021 08:41      Flora Lipps, MD  [facility] 01/11/2021  If 7PM-7AM, please contact night-coverage

## 2021-01-11 NOTE — Transfer of Care (Signed)
Immediate Anesthesia Transfer of Care Note  Patient: Mia [name]  Procedure(s) Performed: INTRAMEDULLARY (IM) NAIL HUMERAL (Right )  Patient Location: PACU  Anesthesia Type:GA combined with regional for post-op pain  Level of Consciousness: awake, alert , oriented, drowsy and patient cooperative  Airway & Oxygen Therapy: Patient Spontanous Breathing and Patient connected to face mask oxygen  Post-op Assessment: Report given to RN and Post -op Vital signs reviewed and stable  Post vital signs: Reviewed and stable  Last Vitals:  Vitals Value Taken Time  BP 202/97 01/11/21 1608  Temp    Pulse 99 01/11/21 1610  Resp 19 01/11/21 1610  SpO2 96 % 01/11/21 1610  Vitals shown include unvalidated device data.  Last Pain:  Vitals:   01/11/21 1301  TempSrc: Oral  PainSc: 0-No pain      Patients Stated Pain Goal: 2 (05/02/22 3612)  Complications: No complications documented.

## 2021-01-11 NOTE — Anesthesia Preprocedure Evaluation (Addendum)
Anesthesia Evaluation  Patient identified by MRN, date of birth, ID band Patient awake  General Assessment Comment:Metastatic breast cancer with bone mets and pathologic fractures  Reviewed: Allergy & Precautions, NPO status , Patient's Chart, lab work & pertinent test results  Airway Mallampati: II  TM Distance: >3 FB Neck ROM: Full    Dental no notable dental hx.    Pulmonary  Pleural effusion   Pulmonary exam normal breath sounds clear to auscultation       Cardiovascular hypertension,  Rhythm:Regular Rate:Tachycardia     Neuro/Psych negative neurological ROS  negative psych ROS   GI/Hepatic negative GI ROS, Neg liver ROS,   Endo/Other  diabetes, Insulin Dependent  Renal/GU negative Renal ROS  negative genitourinary   Musculoskeletal negative musculoskeletal ROS (+)   Abdominal   Peds negative pediatric ROS (+)  Hematology  (+) anemia ,   Anesthesia Other Findings   Reproductive/Obstetrics negative OB ROS                             Anesthesia Physical Anesthesia Plan  ASA: IV  Anesthesia Plan: General   Post-op Pain Management:  Regional for Post-op pain   Induction: Intravenous  PONV Risk Score and Plan: 3 and Ondansetron, Dexamethasone and Treatment may vary due to age or medical condition  Airway Management Planned: Oral ETT  Additional Equipment:   Intra-op Plan:   Post-operative Plan: Extubation in OR  Informed Consent: I have reviewed the patients History and Physical, chart, labs and discussed the procedure including the risks, benefits and alternatives for the proposed anesthesia with the patient or authorized representative who has indicated his/her understanding and acceptance.     Dental advisory given  Plan Discussed with: CRNA and Surgeon  Anesthesia Plan Comments:         Anesthesia Quick Evaluation

## 2021-01-11 NOTE — Interval H&P Note (Signed)
Again discuss the case and recovery with the patient's family. All questions were answered. We will proceed around lunchtime today.

## 2021-01-12 ENCOUNTER — Inpatient Hospital Stay (HOSPITAL_COMMUNITY): Payer: MEDICARE

## 2021-01-12 DIAGNOSIS — C7951 Secondary malignant neoplasm of bone: Secondary | ICD-10-CM | POA: Diagnosis not present

## 2021-01-12 DIAGNOSIS — N179 Acute kidney failure, unspecified: Secondary | ICD-10-CM | POA: Diagnosis not present

## 2021-01-12 DIAGNOSIS — J9601 Acute respiratory failure with hypoxia: Secondary | ICD-10-CM | POA: Diagnosis not present

## 2021-01-12 DIAGNOSIS — J90 Pleural effusion, not elsewhere classified: Secondary | ICD-10-CM | POA: Diagnosis not present

## 2021-01-12 LAB — PHOSPHORUS: Phosphorus: 3.2 mg/dL (ref 2.5–4.6)

## 2021-01-12 LAB — COMPREHENSIVE METABOLIC PANEL
ALT: 12 U/L (ref 0–44)
AST: 27 U/L (ref 15–41)
Albumin: 2.6 g/dL — ABNORMAL LOW (ref 3.5–5.0)
Alkaline Phosphatase: 88 U/L (ref 38–126)
Anion gap: 7 (ref 5–15)
BUN: 14 mg/dL (ref 8–23)
CO2: 30 mmol/L (ref 22–32)
Calcium: 9.5 mg/dL (ref 8.9–10.3)
Chloride: 100 mmol/L (ref 98–111)
Creatinine, Ser: 0.8 mg/dL (ref 0.44–1.00)
GFR, Estimated: >60 mL/min (ref >60)
Glucose, Bld: 154 mg/dL — ABNORMAL HIGH (ref 70–99)
Potassium: 5.2 mmol/L — ABNORMAL HIGH (ref 3.5–5.1)
Sodium: 137 mmol/L (ref 135–145)
Total Bilirubin: 0.4 mg/dL (ref 0.3–1.2)
Total Protein: 5.8 g/dL — ABNORMAL LOW (ref 6.5–8.1)

## 2021-01-12 LAB — GLUCOSE, CAPILLARY
Glucose-Capillary: 119 mg/dL — ABNORMAL HIGH (ref 70–99)
Glucose-Capillary: 189 mg/dL — ABNORMAL HIGH (ref 70–99)
Glucose-Capillary: 129 mg/dL — ABNORMAL HIGH (ref 70–99)
Glucose-Capillary: 141 mg/dL — ABNORMAL HIGH (ref 70–99)

## 2021-01-12 LAB — CBC
HCT: 31.8 % — ABNORMAL LOW (ref 36.0–46.0)
Hemoglobin: 9.6 g/dL — ABNORMAL LOW (ref 12.0–15.0)
MCH: 30.2 pg (ref 26.0–34.0)
MCHC: 30.2 g/dL (ref 30.0–36.0)
MCV: 100 fL (ref 80.0–100.0)
Platelets: 560 10*3/uL — ABNORMAL HIGH (ref 150–400)
RBC: 3.18 MIL/uL — ABNORMAL LOW (ref 3.87–5.11)
RDW: 17 % — ABNORMAL HIGH (ref 11.5–15.5)
WBC: 8.7 10*3/uL (ref 4.0–10.5)
nRBC: 0.2 % (ref 0.0–0.2)

## 2021-01-12 LAB — MAGNESIUM: Magnesium: 1.9 mg/dL (ref 1.7–2.4)

## 2021-01-12 MED ORDER — ONDANSETRON HCL 4 MG PO TABS: 4.0000 mg | ORAL_TABLET | Freq: Four times a day (QID) | ORAL | Status: DC | PRN

## 2021-01-12 MED ORDER — ONDANSETRON HCL 4 MG/2ML IJ SOLN: 4.0000 mg | Freq: Four times a day (QID) | INTRAMUSCULAR | Status: DC | PRN

## 2021-01-12 MED ORDER — OXYCODONE HCL 5 MG PO TABS
5.0000 mg | ORAL_TABLET | ORAL | Status: DC | PRN
  Administered 2021-01-14: 5 mg via ORAL
  Filled 2021-01-12: qty 1

## 2021-01-12 MED ORDER — ACETAMINOPHEN 500 MG PO TABS
1000.0000 mg | ORAL_TABLET | Freq: Three times a day (TID) | ORAL | Status: AC
  Administered 2021-01-12 – 2021-01-13 (×3): 1000 mg via ORAL
  Filled 2021-01-12 (×3): qty 2

## 2021-01-12 MED ORDER — POLYETHYLENE GLYCOL 3350 17 G PO PACK: 17.0000 g | PACK | Freq: Every day | ORAL | Status: DC | PRN

## 2021-01-12 MED ORDER — MENTHOL 3 MG MT LOZG: 1.0000 | LOZENGE | OROMUCOSAL | Status: DC | PRN

## 2021-01-12 MED ORDER — MAGNESIUM CITRATE PO SOLN: 1.0000 | Freq: Once | ORAL | Status: DC | PRN

## 2021-01-12 MED ORDER — METHOCARBAMOL 500 MG PO TABS: 500.0000 mg | ORAL_TABLET | Freq: Four times a day (QID) | ORAL | Status: DC | PRN

## 2021-01-12 MED ORDER — LIDOCAINE HCL 1 % IJ SOLN
INTRAMUSCULAR | Status: AC
  Filled 2021-01-12: qty 20

## 2021-01-12 MED ORDER — OXYCODONE HCL 5 MG PO TABS: 10.0000 mg | ORAL_TABLET | ORAL | Status: DC | PRN

## 2021-01-12 MED ORDER — BISACODYL 10 MG RE SUPP: 10.0000 mg | Freq: Every day | RECTAL | Status: DC | PRN

## 2021-01-12 MED ORDER — METOCLOPRAMIDE HCL 5 MG PO TABS: 5.0000 mg | ORAL_TABLET | Freq: Three times a day (TID) | ORAL | Status: DC | PRN

## 2021-01-12 MED ORDER — METHOCARBAMOL 1000 MG/10ML IJ SOLN
500.0000 mg | Freq: Four times a day (QID) | INTRAVENOUS | Status: DC | PRN
  Filled 2021-01-12: qty 5

## 2021-01-12 MED ORDER — DOCUSATE SODIUM 100 MG PO CAPS
100.0000 mg | ORAL_CAPSULE | Freq: Two times a day (BID) | ORAL | Status: DC
  Administered 2021-01-13: 100 mg via ORAL
  Filled 2021-01-12 (×3): qty 1

## 2021-01-12 MED ORDER — ASPIRIN 81 MG PO CHEW
81.0000 mg | CHEWABLE_TABLET | Freq: Every day | ORAL | Status: DC
  Administered 2021-01-12 – 2021-01-14 (×3): 81 mg via ORAL
  Filled 2021-01-12 (×3): qty 1

## 2021-01-12 MED ORDER — METOCLOPRAMIDE HCL 5 MG/ML IJ SOLN
5.0000 mg | Freq: Three times a day (TID) | INTRAMUSCULAR | Status: DC | PRN
Start: 2021-01-11 — End: 2021-01-14

## 2021-01-12 MED ORDER — HYDROMORPHONE HCL 1 MG/ML IJ SOLN: 0.5000 mg | INTRAMUSCULAR | Status: DC | PRN

## 2021-01-12 MED ORDER — PHENOL 1.4 % MT LIQD: 1.0000 | OROMUCOSAL | Status: DC | PRN

## 2021-01-12 MED ORDER — CEFAZOLIN SODIUM-DEXTROSE 2-4 GM/100ML-% IV SOLN
2.0000 g | Freq: Four times a day (QID) | INTRAVENOUS | Status: AC
  Administered 2021-01-12: 2 g via INTRAVENOUS
  Filled 2021-01-12 (×3): qty 100

## 2021-01-12 MED ORDER — DIPHENHYDRAMINE HCL 12.5 MG/5ML PO ELIX
12.5000 mg | ORAL_SOLUTION | ORAL | Status: DC | PRN
Start: 2021-01-12 — End: 2021-01-14

## 2021-01-12 NOTE — Op Note (Signed)
Orthopaedic Surgery Operative Note (CSN: [id number])  Mia [name]  05/10/1956 Date of Surgery: 01/11/2021   Diagnoses:  Right proximal humeral nonunion  Procedure: Right humeral shaft intramedullary nail fixation Right proximal humeral nonunion treatment   Operative Finding Successful completion of the planned procedure.  Patient had metastatic lesions throughout her proximal and diaphyseal humerus and a pathologic fracture that is now over 3 months old had not gone on to union.  She had successful fixation with a 7 mm nail.  Post-operative plan: The patient will be allowed to use her hands immediately for ADLs and activities less than 1 pound with early therapy for range of motion of the shoulder active assisted and passive.  The patient will be readmitted to the floor for medical management.  DVT prophylaxis per primary team, no orthopedic contraindications.   Pain control with PRN pain medication preferring oral medicines.  Follow up plan will be scheduled in approximately 7 days for incision check and XR.  Post-Op Diagnosis: Same Surgeons:Primary: Hiram Gash, MD Assistants:Caroline McBane PA-C Location: [place] [facility] Anesthesia: General with regional anesthesia Antibiotics: Ancef 2 g with local vancomycin powder 1 g at the surgical site Tourniquet time: * No tourniquets in log * Estimated Blood Loss: Minimal Complications: None Specimens: None Implants: Implant Name Type Inv. Item Serial No. Manufacturer Lot No. LRB No. Used Action  7 mm TI Multiloc Humeral nail right cann/257mm-sterile    DEPU[employer]ES [id number] Right 1 Implanted  4.5 multiloc screw x 43mm    [employer]PU[employer]  Right 1 Implanted  4.5 Multiloc screw x 34 mm    [employer]PU[employer]  Right 1 Implanted  4.5 multiloc screw 32 mm    DEPU[employer]  Right 1 Implanted  4.0 locking x 28 mm    DEPU[employer] SYN[employer]  Right 2 Implanted    Indications for Surgery:   Mia [name] is a 65 y.o. female with history of metastatic  breast cancer with pathologic fracture that is gone on to nonunion the proximal right humerus.  Benefits and risks of operative and nonoperative management were discussed prior to surgery with patient/guardian(s) and informed consent form was completed.  Specific risks including infection, need for additional surgery, continued nonunion, malunion, need for arthroplasty amongst others.  She is a particularly high risk for continued nonunion, skin complications and infection secondary to her radiated skin.   Procedure:   The patient was identified properly. Informed consent was obtained and the surgical site was marked. The patient was taken up to suite where general anesthesia was induced.  The patient was positioned beachchair.  The right humerus was prepped and draped in the usual sterile fashion.  Timeout was performed before the beginning of the case.  We began with a mini open approach to the anterolateral humerus using a 3 cm incision.  This was centered starting just proximal to the anterolateral corner of the acromion.  We went to skin sharply achieving hemostasis we progressed.  We identified the deltoid fascia and opened it in line with our incision in its fibers.  We able to split the fibers bluntly not detaching any of the humerus.  We did a bursectomy and identified that the cuff was intact.  We were able to split the cuff in line with its fibers and identified the biceps tendon which was intact.  We retracted this out of the way.  At this point we used fluoroscopic imaging to place a starting pin at the most proximal aspect of the humerus  on the articular margin central to the cuff insertion.  We placed a guidewire at this point and then overreamed with the entry reamer from the [employer] set.    We used orthogonal images to note that the fracture was still quite mobile and a reduction was actually necessary in order to appropriately align the fracture.  That point were able to place a  guidewire down the fracture and were able to align the fracture fragments.  We then selected a 225 mm nail and were able to place it without issue obtaining orthogonal images we progressed.  We used the outrigger to place 3 proximal screws in typical fashion using percutaneously placed incisions and spreading through the deep surfaces down to bone.  We had good fixation of these 3 screws.  We placed 2 distal interlock using perfect circle technique and making a 2 cm incision through which we dissected down all the way to the bone to avoid neurovascular structures.  Around the world films demonstrated no perforation of the joint surface and good fixation of the proximal distal aspects of the nail.  We irrigated copiously before closing the side to side rotator cuff split with #2 FiberWire.  We again irrigated before placing local vancomycin powder and closing the incision in a multilayer fashion with absorbable sutures.  Sterile dressing was placed and the patient was placed in a sling.  Patient awoken taken to PACU in stable condition.   Mia Chapel, PA-C, present and scrubbed throughout the case, critical for completion in a timely fashion, and for retraction, instrumentation, closure.

## 2021-01-12 NOTE — Progress Notes (Signed)
Occupational Therapy Evaluation  Patient lives at home with spouse in a single level home with 1 step to enter. Patient reports doing as much for herself as she could with self care since pathological fracture at the beginning of this year but spouse assists as needed. Patient uses cane for ambulation. Patient and spouse educated in R UE exercises and precautions, patient familiar with dressing technique as she has been using since prior to sx. Patient supervision to power up to standing from recliner and to take steps with cane. Recommend continued acute OT services to maximize functional use of R UE and facilitate D/C to venue listed below.    01/12/21 1500  OT Visit Information  Last OT Received On 01/12/21  Assistance Needed +1  History of Present Illness Mia [name] is a 65 y.o. female with medical history significant for stage IV metastatic breast cancer to lung and bones ,s/p right mastectomy, radiation and chemotherapy, history of malignant pleural effusion requiring thoracentesis, essential hypertension, insulin-dependent type 2 diabetes presented to hospital 01/05/21 with difficulty urinating, shortness of breath and worsening tremors.    She also reported chronic right-sided shoulder pain that started in January 2022 and has progressively got to the point where she can no longer lift her arm.Patient had metastatic lesions throughout her proximal and diaphyseal humerus and a pathologic fracture that is now over 3 months old had not gone on to union.  She is S/P ORIF right humeral fx 01/11/21.  Precautions  Precautions Fall;Shoulder  Type of Shoulder Precautions AROM to elbow, wrist and hand. allowed to use her hands immediately for ADLs and activities less than 1 pound with early therapy for range of motion of the shoulder active assisted and passive  Shoulder Interventions Shoulder sling/immobilizer;Off for dressing/bathing/exercises  Precaution Booklet Issued Yes (comment)  Precaution  Comments monitor sats, needs O2?, pt. states she has  2 lesions in righ  hip that she gets injections to them every few months  Required Braces or Orthoses Sling  Restrictions  Weight Bearing Restrictions Yes  RUE Weight Bearing NWB  Home Living  Family/patient expects to be discharged to: Private residence  Living Arrangements Spouse/significant other  Available Help at Discharge Family;Available 24 hours/day  Type of [place] to enter  Entrance Stairs-Number of Steps 1  Home Layout One level  Bathroom Shower/Tub Other (comment) (getting walk in installed)  [place] - manual;Other (comment) (shower will have seat built in)  Prior Function  Level of Independence Needs assistance  Gait / Transfers Assistance Needed ambulates with cane,  ADL's / Homemaking Assistance Needed sponge baths  Communication  Communication No difficulties  Pain Assessment  Pain Assessment Faces  Faces Pain Scale 2  Pain Location right shoulder  Pain Descriptors / Indicators Discomfort  Pain Intervention(s) Monitored during session  Cognition  Arousal/Alertness Awake/alert  Behavior During Therapy WFL for tasks assessed/performed  Overall Cognitive Status Within Functional Limits for tasks assessed  Upper Extremity Assessment  Upper Extremity Assessment RUE deficits/detail  RUE Deficits / Details edematous R UE, pt reports lymphedema at baseline but increased with shoulder immobility/surgery. limited elbow flexion ~80 degrees due to swelling  RUE Unable to fully assess due to pain  Lower Extremity Assessment  Lower Extremity Assessment Defer to PT evaluation  Cervical / Trunk Assessment  Cervical / Trunk Assessment Normal  ADL  Overall ADL's  Needs assistance/impaired  Eating/Feeding Set up;Sitting  Grooming Set up;Sitting  Upper Body  Bathing Minimal assistance;Sitting  Lower Body Bathing Minimal assistance;Sitting/lateral  leans;Sit to/from stand  Upper Body Dressing  Moderate assistance;Sitting;Standing  Upper Body Dressing Details (indicate cue type and reason) educate patient in how to doff/don, patient familiar and using same technique prior to sx  Lower Body Dressing Minimal assistance;Sitting/lateral leans;Sit to/from Arboriculturist (cane)  Toilet Transfer Details (indicate cue type and reason) patient able to power up to standing from recliner without physical assistance  Toileting- Clothing Manipulation and Hygiene Minimal assistance;Sitting/lateral lean;Sit to/from stand  Functional mobility during ADLs Supervision/safety;Cane  General ADL Comments patient and spouse educated in coompensatory strategies in order to maintain shoulder precautions during self care  Bed Mobility  General bed mobility comments in recliner  Transfers  Overall transfer level Needs assistance  Equipment used Straight cane  Transfers Sit to/from Stand  Sit to Stand Supervision  Balance  Overall balance assessment Needs assistance  Sitting-balance support Feet supported;No upper extremity supported  Sitting balance-Leahy Scale Good  Standing balance support Single extremity supported  Standing balance-Leahy Scale Poor  Standing balance comment reliant on UE support  Exercises  Exercises Other exercises;General Upper Extremity  General Exercises - Upper Extremity  Elbow Extension AROM;Right;10 reps;Seated  Elbow Flexion AROM;Right;10 reps;Seated  Wrist Flexion AROM;Right;10 reps;Seated  Wrist Extension AROM;Right;10 reps;Seated  Digit Composite Flexion AROM;Right;10 reps;Seated  Composite Extension AROM;Right;10 reps;Seated  Other Exercises  Other Exercises educated patient in AA/PROM exercises for shoulder to her tolerance. educated on towel slides, lap slides and how to use L UE to perform PROM  OT - End of Session  Activity Tolerance Patient tolerated treatment well  Patient left in  chair;with call bell/phone within reach;with family/visitor present  Nurse Communication Mobility status;Other (comment) (sling and UE use)  OT Assessment  OT Recommendation/Assessment Patient needs continued OT Services  OT Visit Diagnosis Pain  Pain - Right/Left Right  Pain - part of body Shoulder  OT Problem List Pain;Impaired UE functional use;Obesity;Decreased knowledge of precautions;Decreased range of motion  OT Plan  OT Frequency (ACUTE ONLY) Min 2X/week  OT Treatment/Interventions (ACUTE ONLY) Self-care/ADL training;Therapeutic exercise;DME and/or AE instruction;Therapeutic activities;Patient/family education;Balance training  AM-PAC OT "6 Clicks" Daily Activity Outcome Measure (Version 2)  Help from another person eating meals? 3  Help from another person taking care of personal grooming? 3  Help from another person toileting, which includes using toliet, bedpan, or urinal? 3  Help from another person bathing (including washing, rinsing, drying)? 3  Help from another person to put on and taking off regular upper body clothing? 2  Help from another person to put on and taking off regular lower body clothing? 3  6 Click Score 17  OT Recommendation  Follow Up Recommendations Home health OT;Supervision/Assistance - 24 hour  OT Equipment 3 in 1 bedside commode  Individuals Consulted  Consulted and Agree with Results and Recommendations Patient  Acute Rehab OT Goals  Patient Stated Goal to go home  OT Goal Formulation With patient/family  Time For Goal Achievement 01/26/21  Potential to Achieve Goals Good  OT Time Calculation  OT Start Time (ACUTE ONLY) 1321  OT Stop Time (ACUTE ONLY) 1348  OT Time Calculation (min) 27 min  OT General Charges  $OT Visit 1 Visit  OT Evaluation  $OT Eval Low Complexity 1 Low  OT Treatments  $Self Care/Home Management  8-22 mins  Written Expression  Dominant Hand Right   Delbert Phenix OT OT pager: [phone]

## 2021-01-12 NOTE — Progress Notes (Signed)
   ORTHOPAEDIC PROGRESS NOTE  s/p Procedure(s): INTRAMEDULLARY (IM) NAIL HUMERAL on 01/11/2021 with Dr. Griffin Basil  SUBJECTIVE: Reports minimal pain about operative site. Nerve block still in effect. Thinks it is affecting her breathing. No other complaints.  OBJECTIVE: PE: General: resting in hospital bed, NAD RUE: bulky dressing CDI. Axillary nerve sensation/motor altered in setting of block and unable to be fully tested.  Distal motor and sensory altered in setting of block.   Vitals:   01/11/21 2112 01/12/21 0458  BP: (!) 151/102 (!) 135/92  Pulse: 85 89  Resp:  (!) 22  Temp: 98 F (36.7 C) 98 F (36.7 C)  SpO2: 92% 97%     ASSESSMENT: Mia [name] is a 65 y.o. female doing well postoperatively. POD#1  PLAN: Weightbearing: NWB RUE  Therapy: allowed to use her hands immediately for ADLs and activities less than 1 pound with early therapy for range of motion of the shoulder active assisted and passive Insicional and dressing care: Reinforce dressings as needed Orthopedic device(s): Sling Showering: Post-op day #3 with assistance VTE prophylaxis: per primary team, no orthopedic contraindications Pain control: PRN pain medications, preferring oral medications Follow - up plan: 1 week in office Contact information:  Dr. Ophelia Charter, Noemi Chapel PA-C, After hours and holidays please check [email or web address] for group call information for [facility]   Noemi Chapel, PA-C 01/12/2021

## 2021-01-12 NOTE — TOC Initial Note (Signed)
Transition of Care [facility]) - Initial/Assessment Note    Patient Details  Name: Mia [name] MRN: [id number] Date of Birth: [birth year]  Transition of Care [facility]) CM/SW Contact:    Dessa Phi, RN Phone Number: 01/12/2021, 2:47 PM  Clinical Narrative:Patient/spouse had no preference-[facility] chosen for HHC-HHPT/OT-rep [facility] following.They decline 3n1-will get on own.On 02-will monitor if needed @ home-[facility] rep Zach aware to follow.                  Expected Discharge Plan: [place] Barriers to Discharge: Continued Medical Work up   Patient Goals and CMS Choice Patient states their goals for this hospitalization and ongoing recovery are:: go home CMS Medicare.gov Compare Post Acute Care list provided to:: Patient Represenative (must comment) Choice offered to / list presented to : Spouse  Expected Discharge Plan and Services Expected Discharge Plan: [place]   Discharge Planning Services: CM Consult Post Acute Care Choice: [place] arrangements for the past 2 months: [place]: PT,OT [place] Agency: [place] Date [facility] Agency Contacted: 01/12/21 Time HH Agency Contacted: [phone] Representative spoke with at [place]: Tommi Rumps  Prior Living Arrangements/Services Living arrangements for the past 2 months: [place] Lives with:: Spouse Patient language and need for interpreter reviewed:: Yes Do you feel safe going back to the place where you live?: Yes      Need for Family Participation in Patient Care: No (Comment) Care giver support system in place?: Yes (comment) Current home services: DME (cane) Criminal Activity/Legal Involvement Pertinent to Current Situation/Hospitalization: No - Comment as needed  Activities of Daily Living Home Assistive Devices/Equipment: Cane (specify quad or straight) (straight cane) ADL Screening (condition at time of  admission) Patient's cognitive ability adequate to safely complete daily activities?: Yes Is the patient deaf or have difficulty hearing?: No Does the patient have difficulty seeing, even when wearing glasses/contacts?: No Does the patient have difficulty concentrating, remembering, or making decisions?: No Patient able to express need for assistance with ADLs?: Yes Does the patient have difficulty dressing or bathing?: Yes Independently performs ADLs?: No Communication: Independent Dressing (OT): Needs assistance Is this a change from baseline?: Pre-admission baseline Grooming: Needs assistance Is this a change from baseline?: Pre-admission baseline Feeding: Independent Bathing: Needs assistance Is this a change from baseline?: Pre-admission baseline Toileting: Needs assistance Is this a change from baseline?: Pre-admission baseline In/Out Bed: Needs assistance Is this a change from baseline?: Pre-admission baseline Walks in Home: Independent with device (comment) (cane) Does the patient have difficulty walking or climbing stairs?: Yes Weakness of Legs: Both Weakness of Arms/Hands: Right  Permission Sought/Granted Permission sought to share information with : Case Manager Permission granted to share information with : Yes, Verbal Permission Granted  Share Information with NAME: Case Manager     Permission granted to share info w Relationship: [name] spouse [phone]     Emotional Assessment Appearance:: Appears stated age Attitude/Demeanor/Rapport: Gracious Affect (typically observed): Accepting Orientation: : Oriented to Self,Oriented to Place,Oriented to  Time,Oriented to Situation Alcohol / Substance Use: Not Applicable Psych Involvement: No (comment)  Admission diagnosis:  Pleural effusion [J90] Chemotherapy-induced neutropenia (HCC) [D70.1, T45.1X5A] Pleural effusion, left [J90] AKI (acute kidney injury) ([place]) [N17.9] Patient Active Problem List   Diagnosis Date  Noted  . Pleural  effusion 01/05/2021  . Acute respiratory failure with hypoxia ([place]) 01/05/2021  . Hypotension 01/05/2021  . AKI (acute kidney injury) ([place]) 01/05/2021  . Hyponatremia 01/05/2021  . Neutropenia ([place]) 01/05/2021  . Shoulder pain, right 01/05/2021  . Goals of care, counseling/discussion 09/29/2019  . Paronychia of thumb, left 02/03/2019  . Drug-induced neutropenia ([place]) 12/04/2018  . HTN (hypertension) 06/03/2018  . HLD (hyperlipidemia) 06/03/2018  . Metastatic breast cancer ([place]) 03/12/2018  . Bone metastases ([place]) 03/12/2018  . Lung metastases ([place]) 03/12/2018  . Osteonecrosis ([place]) 03/12/2018  . Peripheral neuropathy due to chemotherapy ([place]) 03/12/2018  . Diabetes mellitus type 2 in obese ([place]) 03/12/2018  . Hepatic steatosis 03/12/2018  . Aortic atherosclerosis ([place]) 03/12/2018   PCP:  Ronnald Nian, DO Pharmacy:   [id number] [place], [place] [address] [place] [address] Phone: [phone] Fax: [place], [place] [address] Phone: [phone] Fax: [place], [place] [address] [place] [address] Phone: [phone] Fax: [place] [address] Phone: [phone] Fax: [phone]     Social Determinants of Health (SDOH) Interventions    Readmission Risk Interventions No flowsheet data found.

## 2021-01-12 NOTE — Evaluation (Signed)
Physical Therapy Evaluation Patient Details Name: Mia [name] MRN: [id number] DOB: Nov 11, 1955 Today's Date: 01/12/2021   History of Present Illness  Mia [name] is a 65 y.o. female with medical history significant for stage IV metastatic breast cancer to lung and bones ,s/p right mastectomy, radiation and chemotherapy, history of malignant pleural effusion requiring thoracentesis, essential hypertension, insulin-dependent type 2 diabetes presented to hospital 01/05/21 with difficulty urinating, shortness of breath and worsening tremors.    She also reported chronic right-sided shoulder pain that started in January 2022 and has progressively got to the point where she can no longer lift her arm.Patient had metastatic lesions throughout her proximal and diaphyseal humerus and a pathologic fracture that is now over 3 months old had not gone on to union.  She is S/P ORIF right humeral fx 01/11/21.  Clinical Impression  The patient reports right shoulder  Pain is minimal but noting increased slightly.RUE  Sling adjusted. Patient required min assistance to sit up onto bed edge, ambulated  X 20' using Cane and close min guard/min assistance On 2 L [place]..  PTA, patient ambulated in home using Cane.  SPO2 on 2  L 96%. Spouse available to assist.  Pt admitted with above diagnosis. Pt currently with functional limitations due to the deficits listed below (see PT Problem List). Pt will benefit from skilled PT to increase their independence and safety with mobility to allow discharge to the venue listed below.       Follow Up Recommendations Home health PT    Equipment Recommendations  None recommended by PT    Recommendations for Other Services       Precautions / Restrictions Precautions Precautions: Fall Precaution Comments: monitor sats, needs O2?, pt. states she has  2 lesions in right  hip that she gets injections to them every few months Required Braces or Orthoses:  Sling Restrictions Weight Bearing Restrictions: Yes RUE Weight Bearing: Non weight bearing      Mobility  Bed Mobility Overal bed mobility: Needs Assistance Bed Mobility: Supine to Sit     Supine to sit: Min assist     General bed mobility comments: min hand hold assist to pull up to sitting.    Transfers Overall transfer level: Needs assistance Equipment used: Straight cane Transfers: Sit to/from Stand Sit to Stand: Min assist         General transfer comment: steady assistance to stand  Ambulation/Gait Ambulation/Gait assistance: Min assist;Min guard Gait Distance (Feet): 20 Feet Assistive device: Straight cane Gait Pattern/deviations: Step-to pattern Gait velocity: decr   General Gait Details: gait slow and cautious.  Stairs            Wheelchair Mobility    Modified Rankin (Stroke Patients Only)       Balance Overall balance assessment: Needs assistance Sitting-balance support: Feet supported;No upper extremity supported Sitting balance-Leahy Scale: Good     Standing balance support: Single extremity supported Standing balance-Leahy Scale: Poor Standing balance comment: tenuous dynamic  balance when ambulating, needs close minguard                             Pertinent Vitals/Pain Pain Assessment: Faces Pain Score: 2  Pain Location: right shoulder Pain Descriptors / Indicators: Discomfort Pain Intervention(s): Monitored during session;Premedicated before session    Home Living Family/patient expects to be discharged to:: Private residence Living Arrangements: Spouse/significant other Available Help at Discharge: Family;Available 24 hours/day Type of Home: House Home Access:  Stairs to enter   [employer] of Steps: 1 Home Layout: One level Home Equipment: Wheelchair - manual Additional Comments: new walk in shower will have a seat    Prior Function Level of Independence: Needs assistance   Gait / Transfers  Assistance Needed: ambulates with cane,  ADL's / Homemaking Assistance Needed: sponge baths        Hand Dominance        Extremity/Trunk Assessment   Upper Extremity Assessment Upper Extremity Assessment: Defer to OT evaluation;RUE deficits/detail RUE Deficits / Details: in a sling    Lower Extremity Assessment Lower Extremity Assessment: Generalized weakness    Cervical / Trunk Assessment Cervical / Trunk Assessment: Normal  Communication      Cognition Arousal/Alertness: Awake/alert Behavior During Therapy: WFL for tasks assessed/performed Overall Cognitive Status: Within Functional Limits for tasks assessed                                        General Comments      Exercises     Assessment/Plan    PT Assessment Patient needs continued PT services  PT Problem List Decreased strength;Decreased mobility;Decreased safety awareness;Decreased knowledge of precautions;Decreased activity tolerance;Decreased balance       PT Treatment Interventions      PT Goals (Current goals can be found in the Care Plan section)  Acute Rehab PT Goals Patient Stated Goal: to go home PT Goal Formulation: With patient Time For Goal Achievement: 01/26/21 Potential to Achieve Goals: Good    Frequency Min 3X/week   Barriers to discharge        Co-evaluation               AM-PAC PT "6 Clicks" Mobility  Outcome Measure Help needed turning from your back to your side while in a flat bed without using bedrails?: A Little Help needed moving from lying on your back to sitting on the side of a flat bed without using bedrails?: A Little Help needed moving to and from a bed to a chair (including a wheelchair)?: A Little Help needed standing up from a chair using your arms (e.g., wheelchair or bedside chair)?: A Little Help needed to walk in hospital room?: A Lot Help needed climbing 3-5 steps with a railing? : A Lot 6 Click Score: 16    End of Session  Equipment Utilized During Treatment: Gait belt Activity Tolerance: Patient tolerated treatment well Patient left: in chair;with call bell/phone within reach;with nursing/sitter in room Nurse Communication: Mobility status PT Visit Diagnosis: Unsteadiness on feet (R26.81);Difficulty in walking, not elsewhere classified (R26.2)    Time: 9211-9417 PT Time Calculation (min) (ACUTE ONLY): 25 min   Charges:   PT Evaluation $PT Eval Low Complexity: 1 Low PT Treatments $Gait Training: 8-22 mins        Tresa Endo PT Acute Rehabilitation Services Pager [phone] Office [phone]   Claretha Cooper 01/12/2021, 2:16 PM

## 2021-01-12 NOTE — Progress Notes (Signed)
PROGRESS NOTE  Mia [name] OEU:[id number] DOB: [birth year] DOA: 01/05/2021 PCP: Ronnald Nian, DO   LOS: 7 days   Brief narrative:   Mia [name] is a 66 y.o. female with medical history significant for stage IV metastatic breast cancer to lung and bones s/p right mastectomy, radiation and chemotherapy, history of malignant pleural effusion requiring thoracentesis, essential hypertension, insulin-dependent type 2 diabetes presented to hospital with difficulty urinating, shortness of breath and worsening tremors.    She had thoracentesis on 11/21/2020 with 1.5 L malignant effusion removed. She also reported chronic right-sided shoulder pain that started in January 2022 and has progressively got to the point where she can no longer lift her arm.    In the ED, patient was afebrile, borderline hypotensive, and had oxygen desaturation down to 78% requiring 2 L of O2.  Labs notable for significant neutropenia with WBC of 1, absolute neutrophils 0.3.  Hemoglobin 10.4, HCT of 33.7, Na of 128, Creatinine of 2.03 from 1.24.  Chest x-ray showed complete opacification of the left hemothorax.  Patient was then admitted to hospital for further evaluation and treatment.  During hospitalization, patient persisted to have right upper extremity pain.  MRI of the shoulder and humerus was obtained which showed pathological fracture with large tumor burden.  Orthopedics was consulted and patient underwent IM nailing on 01/11/2021.  Patient continues to have large left malignant pleural effusion requiring 2 L of oxygen.  Repeat thoracocentesis has been ordered including physical therapy today.  Assessment/Plan:  Principal Problem:   Acute respiratory failure with hypoxia (HCC) Active Problems:   Metastatic breast cancer ([place])   Bone metastases ([place])   Lung metastases ([place])   Peripheral neuropathy due to chemotherapy ([place])   Diabetes mellitus type 2 in obese (HCC)   Pleural effusion   Hypotension    AKI (acute kidney injury) ([place])   Hyponatremia   Neutropenia (HCC)   Shoulder pain, right  Right shoulder pain/pathologic fracture of proximal right humerus with angulation   Has known metastatic disease to peri-glenoid region of right scapula back in 2019.   Now noted to have new humerus pathological fracture.  MRI of the right shoulder showed around 8 cm metastatic lesion over the proximal humerus with pathological fracture.  Patient underwent  IM nailing on 01/11/2021 by Dr. Griffin Basil orthopedics.  Continue pain management.  Follow orthopedic recommendations.  Acute hypoxic respiratory failure secondary to malignant left pleural effusion  Status post ultrasound-guided thoracocentesis with 1.5 L of fluid removal.  Fluid culture  negative so far.  Cytology of the fluid shows malignant cells consistent with metastatic adenocarcinoma.  Repeat chest x-ray from 01/10/2021 showed accumulation of fluid and opacification left hemithorax.  Will consider for repeat thoracocentesis today.  Neutropenia Resolved at this time.  Latest WBC count of 8.7.  Anemia likely secondary to malignancy.  Status post 1 unit of packed RBC.  Hemoglobin improved after transfusion.  Latest hemoglobin of 9.6..  Hypotension Initially on presentation.  Continue metoprolol.  Blood pressure is stable at this time.  Hyponatremia Resolved.  Latest sodium of 137  Hypomagnesemia.  Mild.  Improved after replacement.  Latest magnesium of 1.9  Acute kidney injury. Improved after IV fluids.  Creatinine of 0.9.  Losartan on hold due to initial AKI and mild borderline hyperkalemia today  History of stage IV metastatic breast cancer to lungs and bones Patient follows with oncologist Dr. [name].  Oncology has seen during hospitalization and will need outpatient follow-up after discharge  Chemo-induced peripheral neuropathy  Continue gabapentin.  Currently stable  Type 2 diabetes mellitus Continue to hold Actos and Janumet.   Continue moderate sliding scale insulin, , Accu-Cheks for now.  Latest POC glucose of 109  Hyperlipidemia 0n statin, fenofibrate   DVT prophylaxis: SCD's Start: 01/12/21 0813 SCDs Start: 01/05/21 2046  Code Status: Full code  Family Communication:  I again spoke with the patient's husband at bedside.  Status is: Inpatient  Remains inpatient appropriate because:IV treatments appropriate due to intensity of illness or inability to take PO and Inpatient level of care appropriate due to severity of illness, status post IM nailing of the right humerus on 01/11/2021, plan for repeat thoracocentesis   Dispo: The patient is from: Home              Anticipated d/c is to: Home in 2 to 3 days               Patient currently is not medically stable to d/c.   Difficult to place patient No  Consultants:  Oncology  Orthopedics  Interventional radiology  Procedures:  Status post thoracocentesis on 01/06/2021.  Right humeral shaft intramedullary nail fixation on 01/11/2021  Anti-infectives:  . Cefepime 4/7>4/10  Anti-infectives (From admission, onward)   Start     Dose/Rate Route Frequency Ordered Stop   01/12/21 0900  ceFAZolin (ANCEF) IVPB 2g/100 mL premix        2 g 200 mL/hr over 30 Minutes Intravenous Every 6 hours 01/12/21 0812 01/12/21 2059   01/11/21 1513  vancomycin (VANCOCIN) powder  Status:  Discontinued          As needed 01/11/21 1518 01/11/21 1705   01/11/21 1315  ceFAZolin (ANCEF) IVPB 2g/100 mL premix        2 g 200 mL/hr over 30 Minutes Intravenous On call to O.R. 01/11/21 1303 01/11/21 1453   01/06/21 1400  ceFEPIme (MAXIPIME) 2 g in sodium chloride 0.9 % 100 mL IVPB        2 g 200 mL/hr over 30 Minutes Intravenous Every 12 hours 01/06/21 0807 01/08/21 1510   01/05/21 2100  vancomycin (VANCOREADY) IVPB 1500 mg/300 mL        1,500 mg 150 mL/hr over 120 Minutes Intravenous  Once 01/05/21 2055 01/06/21 0305   01/05/21 2100  ceFEPIme (MAXIPIME) 2 g in sodium  chloride 0.9 % 100 mL IVPB        2 g 200 mL/hr over 30 Minutes Intravenous  Once 01/05/21 2055 01/06/21 0355     Subjective: Today, patient was seen and examined at bedside.  Patient complained of mild shortness of breath after her nerve block and surgery yesterday.  Did not feel quite at her baseline this morning.  Required 4 L of oxygen.   Objective: Vitals:   01/11/21 2112 01/12/21 0458  BP: (!) 151/102 (!) 135/92  Pulse: 85 89  Resp:  (!) 22  Temp: 98 F (36.7 C) 98 F (36.7 C)  SpO2: 92% 97%    Intake/Output Summary (Last 24 hours) at 01/12/2021 0825 Last data filed at 01/12/2021 0500 Gross per 24 hour  Intake 800 ml  Output 450 ml  Net 350 ml   Filed Weights   01/05/21 1508 01/11/21 1301  Weight: 99.3 kg 99.3 kg   Body mass index is 37.58 kg/m.   Physical Exam:  General: Obese built, not in obvious distress nasal cannula 4 L/min HENT:   Mild pallor noted.  Oral mucosa is moist.  Chest: Diminished breath sound mostly  on the left side. CVS: S1 &S2 heard. No murmur.  Regular rate and rhythm. Abdomen: Soft, nontender, nondistended.  Bowel sounds are heard.   Extremities: No cyanosis, clubbing but trace lower extremity edema peripheral pulses are palpable.  Right upper extremity with large dressing.  Able to move fingers capillary refill present. Psych: Alert, awake and oriented, normal mood CNS:  No cranial nerve deficits.  Power equal in all extremities.   Skin: Warm and dry.  No rashes noted.   Data Review: I have personally reviewed the following laboratory data and studies,  CBC: Recent Labs  Lab 01/07/21 0441 01/08/21 0557 01/09/21 0543 01/10/21 0902 01/11/21 0454 01/12/21 0450  WBC 1.5* 2.3* 3.4* 4.6 5.5 8.7  NEUTROABS 0.2* 0.1* 1.1* 1.4* 2.2  --   HGB 6.5* 8.4* 8.7* 9.1* 8.6* 9.6*  HCT 21.2* 26.5* 27.6* 29.9* 28.9* 31.8*  MCV 100.5* 96.0 96.8 99.7 100.0 100.0  PLT 577* 654* 667* 653* 632* 585*   Basic Metabolic Panel: Recent Labs  Lab  01/07/21 0441 01/08/21 0557 01/10/21 0434 01/11/21 0454 01/12/21 0450  NA 134* 138 133* 136 137  K 5.0 5.1 4.9 4.4 5.2*  CL 98 102 97* 98 100  CO2 29 25 29 29 30   GLUCOSE 98 98 100* 114* 154*  BUN 21 18 12 10 14   CREATININE 1.21* 1.00 0.87 0.97 0.80  CALCIUM 8.5* 8.4* 8.7* 9.2 9.5  MG 1.8 1.7 1.5* 1.8 1.9  PHOS 2.4* 3.5 2.6  --  3.2   Liver Function Tests: Recent Labs  Lab 01/05/21 1911 01/08/21 0557 01/12/21 0450  AST 17 24 27   ALT 10 9 12   ALKPHOS 103 83 88  BILITOT 0.5 0.9 0.4  PROT 6.0* 5.2* 5.8*  ALBUMIN 2.7* 2.2* 2.6*   No results for input(s): LIPASE, AMYLASE in the last 168 hours. No results for input(s): AMMONIA in the last 168 hours. Cardiac Enzymes: No results for input(s): CKTOTAL, CKMB, CKMBINDEX, TROPONINI in the last 168 hours. BNP (last 3 results) No results for input(s): BNP in the last 8760 hours.  ProBNP (last 3 results) No results for input(s): PROBNP in the last 8760 hours.  CBG: Recent Labs  Lab 01/10/21 2036 01/11/21 0742 01/11/21 1311 01/11/21 2115 01/12/21 0746  GLUCAP 114* 96 101* 168* 119*   Recent Results (from the past 240 hour(s))  SARS CORONAVIRUS 2 (TAT 6-24 HRS) Nasopharyngeal Nasopharyngeal Swab     Status: None   Collection Time: 01/03/21 12:53 PM   Specimen: Nasopharyngeal Swab  Result Value Ref Range Status   SARS Coronavirus 2 NEGATIVE NEGATIVE Final    Comment: (NOTE) SARS-CoV-2 target nucleic acids are NOT DETECTED.  The SARS-CoV-2 RNA is generally detectable in upper and lower respiratory specimens during the acute phase of infection. Negative results do not preclude SARS-CoV-2 infection, do not rule out co-infections with other pathogens, and should not be used as the sole basis for treatment or other patient management decisions. Negative results must be combined with clinical observations, patient history, and epidemiological information. The expected result is Negative.  Fact Sheet for  Patients: SugarRoll.be  Fact Sheet for Healthcare Providers: https://www.woods-mathews.com/  This test is not yet approved or cleared by the [place] FDA and  has been authorized for detection and/or diagnosis of SARS-CoV-2 by FDA under an Emergency Use Authorization (EUA). This EUA will remain  in effect (meaning this test can be used) for the duration of the COVID-19 declaration under Se ction 564(b)(1) of the Act, 21 U.S.C. section  360bbb-3(b)(1), unless the authorization is terminated or revoked sooner.  Performed at [facility], [place] [address] [place], [place] [address]   Resp Panel by RT-PCR (Flu A&B, Covid) Nasopharyngeal Swab     Status: None   Collection Time: 01/05/21  4:20 PM   Specimen: Nasopharyngeal Swab; Nasopharyngeal(NP) swabs in vial transport medium  Result Value Ref Range Status   SARS Coronavirus 2 by RT PCR NEGATIVE NEGATIVE Final    Comment: (NOTE) SARS-CoV-2 target nucleic acids are NOT DETECTED.  The SARS-CoV-2 RNA is generally detectable in upper respiratory specimens during the acute phase of infection. The lowest concentration of SARS-CoV-2 viral copies this assay can detect is 138 copies/mL. A negative result does not preclude SARS-Cov-2 infection and should not be used as the sole basis for treatment or other patient management decisions. A negative result may occur with  improper specimen collection/handling, submission of specimen other than nasopharyngeal swab, presence of viral mutation(s) within the areas targeted by this assay, and inadequate number of viral copies(<138 copies/mL). A negative result must be combined with clinical observations, patient history, and epidemiological information. The expected result is Negative.  Fact Sheet for Patients:  EntrepreneurPulse.com.au  Fact Sheet for Healthcare Providers:  IncredibleEmployment.be  This test is  no t yet approved or cleared by the [place] FDA and  has been authorized for detection and/or diagnosis of SARS-CoV-2 by FDA under an Emergency Use Authorization (EUA). This EUA will remain  in effect (meaning this test can be used) for the duration of the COVID-19 declaration under Section 564(b)(1) of the Act, 21 U.S.C.section 360bbb-3(b)(1), unless the authorization is terminated  or revoked sooner.       Influenza A by PCR NEGATIVE NEGATIVE Final   Influenza B by PCR NEGATIVE NEGATIVE Final    Comment: (NOTE) The Xpert Xpress SARS-CoV-2/FLU/RSV plus assay is intended as an aid in the diagnosis of influenza from Nasopharyngeal swab specimens and should not be used as a sole basis for treatment. Nasal washings and aspirates are unacceptable for Xpert Xpress SARS-CoV-2/FLU/RSV testing.  Fact Sheet for Patients: EntrepreneurPulse.com.au  Fact Sheet for Healthcare Providers: IncredibleEmployment.be  This test is not yet approved or cleared by the [place] FDA and has been authorized for detection and/or diagnosis of SARS-CoV-2 by FDA under an Emergency Use Authorization (EUA). This EUA will remain in effect (meaning this test can be used) for the duration of the COVID-19 declaration under Section 564(b)(1) of the Act, 21 U.S.C. section 360bbb-3(b)(1), unless the authorization is terminated or revoked.  Performed at [facility], [place] [address], [place], [place] [address]   Urine culture     Status: Abnormal   Collection Time: 01/05/21 10:00 PM   Specimen: Urine, Random  Result Value Ref Range Status   Specimen Description   Final    URINE, RANDOM Performed at [place] [address], [place], [place] [address]    Special Requests   Final    NONE Performed at [facility], [place] [address] [place], [place] [address]    Culture 20,000 COLONIES/mL ESCHERICHIA COLI (A)   Final   Report Status 01/08/2021 FINAL  Final   Organism ID, Bacteria ESCHERICHIA COLI (A)  Final      Susceptibility   Escherichia coli - MIC*    AMPICILLIN <=2 SENSITIVE Sensitive     CEFAZOLIN <=4 SENSITIVE Sensitive     CEFEPIME <=0.12 SENSITIVE Sensitive     CEFTRIAXONE <=0.25 SENSITIVE Sensitive  CIPROFLOXACIN <=0.25 SENSITIVE Sensitive     GENTAMICIN <=1 SENSITIVE Sensitive     IMIPENEM <=0.25 SENSITIVE Sensitive     NITROFURANTOIN <=16 SENSITIVE Sensitive     TRIMETH/SULFA <=20 SENSITIVE Sensitive     AMPICILLIN/SULBACTAM <=2 SENSITIVE Sensitive     PIP/TAZO <=4 SENSITIVE Sensitive     * 20,000 COLONIES/mL ESCHERICHIA COLI  Culture, blood (routine x 2)     Status: None   Collection Time: 01/06/21  1:34 AM   Specimen: BLOOD  Result Value Ref Range Status   Specimen Description   Final    BLOOD BLOOD LEFT HAND Performed at [place] [address] [place], [place] [address]    Special Requests   Final    BOTTLES DRAWN AEROBIC ONLY Blood Culture adequate volume Performed at [place] [address], [place], [place] [address]    Culture   Final    NO GROWTH 5 DAYS Performed at [facility], [place] [address], [place], [place] [address]    Report Status 01/11/2021 FINAL  Final  Culture, blood (routine x 2)     Status: None   Collection Time: 01/06/21  1:34 AM   Specimen: BLOOD  Result Value Ref Range Status   Specimen Description   Final    BLOOD BLOOD LEFT WRIST Performed at [place] [address], [place], [place] [address]    Special Requests   Final    BOTTLES DRAWN AEROBIC ONLY Blood Culture adequate volume Performed at [place] [address] [place], [place] [address]    Culture   Final    NO GROWTH 5 DAYS Performed at [facility], [place] [address], [place], [place] [address]    Report Status 01/11/2021 FINAL  Final  MRSA PCR Screening     Status:  None   Collection Time: 01/06/21  6:22 AM   Specimen: Nasal Mucosa; Nasopharyngeal  Result Value Ref Range Status   MRSA by PCR NEGATIVE NEGATIVE Final    Comment:        The GeneXpert MRSA Assay (FDA approved for NASAL specimens only), is one component of a comprehensive MRSA colonization surveillance program. It is not intended to diagnose MRSA infection nor to guide or monitor treatment for MRSA infections. Performed at [facility], [place] [address], [place], [place] [address]   Acid Fast Smear (AFB)     Status: None   Collection Time: 01/06/21  1:03 PM   Specimen: PATH Cytology Pleural fluid  Result Value Ref Range Status   AFB Specimen Processing Concentration  Final   Acid Fast Smear Negative  Final    Comment: (NOTE) Performed At: [facility] [place], [place] [id number] Rush Farmer MD [id number]    Source (AFB) PLEURAL  Final    Comment: Performed at [facility], [place] [address] [place], [place] [address]  Body fluid culture w Gram Stain     Status: None   Collection Time: 01/06/21  1:03 PM   Specimen: PATH Cytology Pleural fluid  Result Value Ref Range Status   Specimen Description   Final    PLEURAL Performed at [place] [address], [place], [place] [address]    Special Requests   Final    NONE Performed at [facility], [place] [address], [place], [place] [address]    Gram Stain NO WBC SEEN NO ORGANISMS SEEN  Final   Culture   Final    NO GROWTH 3 DAYS Performed at [facility], [place] [address], [place], [place] [address]    Report Status 01/10/2021 FINAL  Final  Surgical PCR screen     Status: None   Collection Time: 01/08/21  5:23 PM   Specimen: Nasal Mucosa; Nasal Swab  Result Value Ref Range Status   MRSA, PCR NEGATIVE NEGATIVE Final   Staphylococcus aureus NEGATIVE NEGATIVE Final    Comment: (NOTE) The Xpert SA Assay (FDA  approved for NASAL specimens in patients 40 years of age and older), is one component of a comprehensive surveillance program. It is not intended to diagnose infection nor to guide or monitor treatment. Performed at [facility], [place] [address] [place], [place] [address]      Studies: DG Humerus Right  Result Date: 01/11/2021 CLINICAL DATA:  Right humerus fracture. EXAM: RIGHT HUMERUS - 2+ VIEW COMPARISON:  01/06/2021 FINDINGS: Again noted is a fracture involving the proximal humerus. Intramedullary rod has been placed with 3 proximal interlocking screws and 2 distal interlocking screws. Improved alignment of the humerus with near anatomic alignment. Right shoulder appears located on these views. Extensive degenerative changes at the right [facility] joint. IMPRESSION: Internal fixation of the proximal right humerus fracture. Improved alignment of the humerus. Electronically Signed   By: Markus Daft M.D.   On: 01/11/2021 16:43   DG Humerus Right  Result Date: 01/11/2021 CLINICAL DATA:  Proximal humerus fracture. EXAM: RIGHT HUMERUS - 2+ VIEW COMPARISON:  01/06/2021. FINDINGS: Intraoperative images demonstrate an intramedullary rod in the humerus with 3 proximal interlocking screws. Two distal interlocking screws. IMPRESSION: Internal fixation of the right humerus fracture. Electronically Signed   By: Markus Daft M.D.   On: 01/11/2021 16:37   DG C-Arm 1-60 Min-No Report  Result Date: 01/11/2021 Fluoroscopy was utilized by the requesting physician.  No radiographic interpretation.      Flora Lipps, MD  [facility] 01/12/2021  If 7PM-7AM, please contact night-coverage

## 2021-01-12 NOTE — Progress Notes (Signed)
IR consulted by Dr. Louanne Belton for possible image-guided thoracentesis.  Patient was called for procedure, however she refused procedure at this time. No plans for IR procedure at this time- will delete order. Please re-consult IR if procedure desired in future. Dr. Louanne Belton made aware.  IR available in future if needed.   Bea Graff Najmo Pardue, PA-C 01/12/2021, 10:10 AM

## 2021-01-12 NOTE — Anesthesia Postprocedure Evaluation (Signed)
Anesthesia Post Note  Patient: Mia [name]  Procedure(s) Performed: INTRAMEDULLARY (IM) NAIL HUMERAL (Right )     Patient location during evaluation: PACU Anesthesia Type: General Level of consciousness: awake and alert Pain management: pain level controlled Vital Signs Assessment: post-procedure vital signs reviewed and stable Respiratory status: spontaneous breathing, nonlabored ventilation, respiratory function stable and patient connected to nasal cannula oxygen Cardiovascular status: blood pressure returned to baseline and stable Postop Assessment: no apparent nausea or vomiting Anesthetic complications: no   No complications documented.  Last Vitals:  Vitals:   01/11/21 2112 01/12/21 0458  BP: (!) 151/102 (!) 135/92  Pulse: 85 89  Resp:  (!) 22  Temp: 36.7 C 36.7 C  SpO2: 92% 97%    Last Pain:  Vitals:   01/12/21 0458  TempSrc: Oral  PainSc:                  Shyah Cadmus S

## 2021-01-13 ENCOUNTER — Inpatient Hospital Stay (HOSPITAL_COMMUNITY): Payer: MEDICARE

## 2021-01-13 DIAGNOSIS — J90 Pleural effusion, not elsewhere classified: Secondary | ICD-10-CM | POA: Diagnosis not present

## 2021-01-13 DIAGNOSIS — S4291XA Fracture of right shoulder girdle, part unspecified, initial encounter for closed fracture: Secondary | ICD-10-CM

## 2021-01-13 DIAGNOSIS — C7951 Secondary malignant neoplasm of bone: Secondary | ICD-10-CM | POA: Diagnosis not present

## 2021-01-13 DIAGNOSIS — J9601 Acute respiratory failure with hypoxia: Secondary | ICD-10-CM | POA: Diagnosis not present

## 2021-01-13 LAB — COMPREHENSIVE METABOLIC PANEL
ALT: 12 U/L (ref 0–44)
AST: 26 U/L (ref 15–41)
Albumin: 2.6 g/dL — ABNORMAL LOW (ref 3.5–5.0)
Alkaline Phosphatase: 85 U/L (ref 38–126)
Anion gap: 8 (ref 5–15)
BUN: 13 mg/dL (ref 8–23)
CO2: 30 mmol/L (ref 22–32)
Calcium: 9.2 mg/dL (ref 8.9–10.3)
Chloride: 98 mmol/L (ref 98–111)
Creatinine, Ser: 0.75 mg/dL (ref 0.44–1.00)
GFR, Estimated: >60 mL/min (ref >60)
Glucose, Bld: 127 mg/dL — ABNORMAL HIGH (ref 70–99)
Potassium: 4.4 mmol/L (ref 3.5–5.1)
Sodium: 136 mmol/L (ref 135–145)
Total Bilirubin: 0.6 mg/dL (ref 0.3–1.2)
Total Protein: 5.9 g/dL — ABNORMAL LOW (ref 6.5–8.1)

## 2021-01-13 LAB — CBC
HCT: 27.3 % — ABNORMAL LOW (ref 36.0–46.0)
Hemoglobin: 8.5 g/dL — ABNORMAL LOW (ref 12.0–15.0)
MCH: 29.9 pg (ref 26.0–34.0)
MCHC: 31.1 g/dL (ref 30.0–36.0)
MCV: 96.1 fL (ref 80.0–100.0)
Platelets: 519 10*3/uL — ABNORMAL HIGH (ref 150–400)
RBC: 2.84 MIL/uL — ABNORMAL LOW (ref 3.87–5.11)
RDW: 17.1 % — ABNORMAL HIGH (ref 11.5–15.5)
WBC: 10.2 10*3/uL (ref 4.0–10.5)
nRBC: 0.2 % (ref 0.0–0.2)

## 2021-01-13 LAB — GLUCOSE, CAPILLARY
Glucose-Capillary: 111 mg/dL — ABNORMAL HIGH (ref 70–99)
Glucose-Capillary: 109 mg/dL — ABNORMAL HIGH (ref 70–99)
Glucose-Capillary: 112 mg/dL — ABNORMAL HIGH (ref 70–99)
Glucose-Capillary: 140 mg/dL — ABNORMAL HIGH (ref 70–99)

## 2021-01-13 LAB — BODY FLUID CELL COUNT WITH DIFFERENTIAL
Lymphs, Fluid: 27 %
Monocyte-Macrophage-Serous Fluid: 62 % (ref 50–90)
Neutrophil Count, Fluid: 11 % (ref 0–25)
Total Nucleated Cell Count, Fluid: 155 cu mm (ref 0–1000)

## 2021-01-13 LAB — LACTATE DEHYDROGENASE, PLEURAL OR PERITONEAL FLUID: LD, Fluid: 172 U/L — ABNORMAL HIGH (ref 3–23)

## 2021-01-13 LAB — PROTEIN, PLEURAL OR PERITONEAL FLUID: Total protein, fluid: 3.1 g/dL

## 2021-01-13 LAB — MAGNESIUM: Magnesium: 1.7 mg/dL (ref 1.7–2.4)

## 2021-01-13 MED ORDER — LIDOCAINE HCL 1 % IJ SOLN
INTRAMUSCULAR | Status: AC
  Filled 2021-01-13: qty 20

## 2021-01-13 NOTE — Progress Notes (Signed)
   ORTHOPAEDIC PROGRESS NOTE  s/p Procedure(s): INTRAMEDULLARY (IM) NAIL HUMERAL on 01/11/2021 with Dr. Griffin Basil  SUBJECTIVE: Reports minimal pain about operative site. Lymphedema of the right arm. Family at bedside No other complaints.  OBJECTIVE: PE: General: sitting up in recliner, NAD RUE: incisions CDI. New mepilex dressing placed. Lymphedema of right forearm and hand.  + Motor in  AIN, PIN, Ulnar distributions. Axillary nerve sensation preserved.  Sensation intact in medial, radial, and ulnar distributions. Well perfused digits.     Vitals:   01/13/21 1138 01/13/21 1400  BP: 119/62   Pulse:  100  Resp:  20  Temp:  98.3 F (36.8 C)  SpO2:  98%     ASSESSMENT: Mia [name] is a 65 y.o. female doing well postoperatively. POD#2  PLAN: Weightbearing: NWB RUE  Therapy: allowed to use her hands immediately for ADLs and activities less than 1 pound with early therapy for range of motion of the shoulder active assisted and passive Insicional and dressing care: Reinforce dressings as needed New Mepilex dressing placed today.  Orthopedic device(s): Sling Showering: Post-op day #3 with assistance VTE prophylaxis: per primary team, no orthopedic contraindications Pain control: PRN pain medications, preferring oral medications Follow - up plan: 1 week in office Contact information:  Dr. Ophelia Charter, Noemi Chapel PA-C, After hours and holidays please check [email or web address] for group call information for [facility]   Noemi Chapel, PA-C 01/13/2021

## 2021-01-13 NOTE — Procedures (Signed)
Ultrasound-guided diagnostic and therapeutic left thoracentesis performed yielding 570 cc of dark, bloody fluid. No immediate complications. Follow-up chest x-ray pending. The fluid was sent to the lab for preordered studies. Due to pt chest discomfort only the above amount of fluid was removed today- this is likely to occur with each thoracentesis preventing complete evacuation of fluid each time.

## 2021-01-13 NOTE — Care Management Important Message (Signed)
Medicare IM printed to give to the patient, by Quenna Doepke 

## 2021-01-13 NOTE — Progress Notes (Signed)
Physical Therapy Treatment Patient Details Name: Mia [name] MRN: [id number] DOB: 04-08-1956 Today's Date: 01/13/2021    History of Present Illness Mia [name] is a 65 y.o. female with medical history significant for stage IV metastatic breast cancer to lung and bones ,s/p right mastectomy, radiation and chemotherapy, history of malignant pleural effusion requiring thoracentesis, essential hypertension, insulin-dependent type 2 diabetes presented to hospital 01/05/21 with difficulty urinating, shortness of breath and worsening tremors.    She also reported chronic right-sided shoulder pain that started in January 2022 and has progressively got to the point where she can no longer lift her arm.Patient had metastatic lesions throughout her proximal and diaphyseal humerus and a pathologic fracture that is now over 3 months old had not gone on to union.  She is S/P ORIF right humeral fx 01/11/21.    PT Comments    Pt progressing toward goals.  Fatigues with gait. Pt reports her LEs feel very weak. Educated on performing sit<>stands ~3 reps a few times per day as tolerated with family assist/supervision. Will continue to follow in acute sitting.    Follow Up Recommendations  Home health PT;Supervision for mobility/OOB     Equipment Recommendations  None recommended by PT    Recommendations for Other Services       Precautions / Restrictions Precautions Precautions: Fall;Shoulder Shoulder Interventions: Shoulder sling/immobilizer;Off for dressing/bathing/exercises Precaution Comments: on 4L O2 today Required Braces or Orthoses: Sling Restrictions RUE Weight Bearing: Non weight bearing    Mobility  Bed Mobility Overal bed mobility: Needs Assistance Bed Mobility: Sit to Supine       Sit to supine: Min assist;Mod assist   General bed mobility comments: assist to bring her LEs on to bed    Transfers Overall transfer level: Needs assistance Equipment used: Straight  cane Transfers: Sit to/from Stand Sit to Stand: Supervision         General transfer comment: for safety  Ambulation/Gait Ambulation/Gait assistance: Min guard;Min assist Gait Distance (Feet): 70 Feet Assistive device: Straight cane Gait Pattern/deviations: Step-through pattern;Decreased stride length;Wide base of support Gait velocity: decr   General Gait Details: unsteady but without overt LOB, pt spouse assisting with O2 tank   Stairs             Wheelchair Mobility    Modified Rankin (Stroke Patients Only)       Balance                                            Cognition Arousal/Alertness: Awake/alert Behavior During Therapy: WFL for tasks assessed/performed Overall Cognitive Status: Within Functional Limits for tasks assessed                                        Exercises      General Comments General comments (skin integrity, edema, etc.): reviwed use of IS, pt completed x 10 while sitting EOB      Pertinent Vitals/Pain Pain Assessment: Faces Faces Pain Scale: Hurts a little bit Pain Location: right shoulder Pain Descriptors / Indicators: Discomfort Pain Intervention(s): Limited activity within patient's tolerance;Monitored during session    Home Living                      Prior Function  PT Goals (current goals can now be found in the care plan section) Acute Rehab PT Goals Patient Stated Goal: to go home PT Goal Formulation: With patient Time For Goal Achievement: 01/26/21 Potential to Achieve Goals: Good Progress towards PT goals: Progressing toward goals    Frequency    Min 3X/week      PT Plan Current plan remains appropriate    Co-evaluation              AM-PAC PT "6 Clicks" Mobility   Outcome Measure  Help needed turning from your back to your side while in a flat bed without using bedrails?: A Little Help needed moving from lying on your back to sitting  on the side of a flat bed without using bedrails?: A Little Help needed moving to and from a bed to a chair (including a wheelchair)?: A Little Help needed standing up from a chair using your arms (e.g., wheelchair or bedside chair)?: A Little Help needed to walk in hospital room?: A Little Help needed climbing 3-5 steps with a railing? : A Lot 6 Click Score: 17    End of Session Equipment Utilized During Treatment: Gait belt Activity Tolerance: Patient tolerated treatment well Patient left: in bed;with call bell/phone within reach;with family/visitor present   PT Visit Diagnosis: Unsteadiness on feet (R26.81);Difficulty in walking, not elsewhere classified (R26.2)     Time: 4174-0814 PT Time Calculation (min) (ACUTE ONLY): 26 min  Charges:  $Gait Training: 23-37 mins                     Baxter Flattery, PT  Acute Rehab Dept ([place]) [phone] Pager [phone]  01/13/2021    [facility] 01/13/2021, 3:46 PM

## 2021-01-13 NOTE — Progress Notes (Signed)
PROGRESS NOTE    Mia [name]  YTK:[id number] DOB: 1956-09-24 DOA: 01/05/2021 PCP: Mia Nian, DO   Brief Narrative:  Mia [name] a 65 y.o.femalewith medical history significant forstage IV metastatic breast cancer to lung and boness/p right mastectomy, radiation and chemotherapy, history of malignant pleural effusion requiring thoracentesis, essential hypertension, insulin-dependent type 2 diabetes presented to hospital with difficulty urinating, shortness of breath and worsening tremors.   She had thoracentesis on 11/21/2020 with 1.5 L malignant effusion removed. She also reported chronic right-sided shoulder pain that started in January 2022 and has progressively got to the point where she can no longer lift her arm.   In the ED, patient was afebrile, borderline hypotensive, and hadoxygen desaturation down to 78% requiring 2 L of O2.Labs notable for significant neutropenia with WBC of 1, absolute neutrophils 0.3. Hemoglobin 10.4, HCT of 33.7, Na of 128, Creatinine of 2.03 from 1.24.  Chest x-ray showed complete opacification of the left hemothorax.  Patient was then admitted to hospital for further evaluation and treatment.  During hospitalization, patient persisted to have right upper extremity pain.  MRI of the shoulder and humerus was obtained which showed pathological fracture with large tumor burden.  Orthopedics was consulted and patient underwent IM nailing on 01/11/2021.  Patient continues to have large left malignant pleural effusion requiring 2 L of oxygen.  Repeat thoracocentesis has been ordered including physical therapy today   Assessment & Plan:   Principal Problem:   Acute respiratory failure with hypoxia (HCC) Active Problems:   Metastatic breast cancer (Mia [name])   Bone metastases (HCC)   Lung metastases (Mia [name])   Peripheral neuropathy due to chemotherapy (Mia [name])   Diabetes mellitus type 2 in obese (HCC)   Pleural effusion   Hypotension   AKI  (acute kidney injury) (Mia [name])   Hyponatremia   Neutropenia (HCC)   Shoulder pain, right   Right shoulder pain/pathologic fracture of proximal right humerus with angulation  Has known metastatic disease to peri-glenoid region of right scapula back in 2019.  Now noted to have new humerus pathological fracture.  MRI of the right shoulder showed around 8 cm metastatic lesion over the proximal humerus with pathological fracture.  Patient underwent  IM nailing on 01/11/2021 by Dr. Griffin [name] orthopedics.  Continue pain management.  Follow orthopedic recommendations.  Acute hypoxic respiratory failure secondary to malignant left pleural effusion  Status post ultrasound-guided thoracocentesis with 1.5 L of fluid removal.  Fluid culture  negative so far.  Cytology of the fluid shows malignant cells consistent with metastatic adenocarcinoma.  Repeat chest x-ray from 01/10/2021 showed accumulation of fluid and opacification left hemithorax, patient was unable to tolerate thoracentesis yesterday secondary pain and deferred procedure Agreed to thoracentesis today with nearly 600 mL of fluid removed, procedure limited by discomfort.  Radiology noted this will likely be an ongoing complication of thoracentesis for this patient  Neutropenia Resolved at this time.  Latest WBC count of 10.2  Anemia likely secondary to malignancy.  Status post 1 unit of packed RBC.  Hemoglobin improved after transfusion.  Latest hemoglobin of 10.2  Hypotension Initially on presentation.  Continue metoprolol.  Blood pressure is stable at this time.  Hyponatremia Resolved.  Latest sodium of 136  Hypomagnesemia.  Mild.  Improved after replacement.  Latest magnesium of 1.7 replete with p.o.  Acute kidney injury. Improved after IV fluids.  Creatinine of 075.  Losartan on hold due to initial AKI and mild borderline hyperkalemia today  History of stage IV metastatic breast cancer  to lungs and bones Patient follows with  oncologist Mia [name].  Oncology has seen during hospitalization and will need outpatient follow-up after discharge  Chemo-induced peripheral neuropathy Continue gabapentin.  Currently stable  Type 2 diabetes mellitus Continue to hold Actos and Janumet.  Continue moderate sliding scale insulin, , Accu-Cheks for now.  Hyperlipidemia 0n statin, fenofibrate  DVT prophylaxis: SCD/Compression stockings  Code Status: full    Code Status Orders  (From admission, onward)         Start     Ordered   01/05/21 2047  Full code  Continuous        01/05/21 2047        Code Status History    This patient has a current code status but no historical code status.   Advance Care Planning Activity    Advance Directive Documentation   Flowsheet Row Most Recent Value  Type of Advance Directive Healthcare Power of Attorney, Living will  Pre-existing out of facility DNR order (yellow form or pink MOST form) --  "MOST" Form in Place? --     Family Communication: husband at bedside Disposition Plan:   Patient will remain inpatient for continued pain management, respiratory support, follow-up of pleural effusion labs.  Repeat chest x-ray in the morning Consults called: None Admission status: Inpatient   Consultants:   IR, ONC  Procedures:  DG Chest 1 View  Result Date: 01/13/2021 CLINICAL DATA:  65 year old female with history of left pleural effusion, metastatic breast cancer. Status post left thoracentesis. EXAM: CHEST  1 VIEW COMPARISON:  01/10/2021 FINDINGS: The left cardiomediastinal silhouette is obscured, similar to comparison. Similar appearing large left pleural effusion and associated left lung atelectasis, likely mostly passive secondary to effusion. The right lung is clear. No evidence of pneumothorax. Surgical clips in the right axilla, unchanged. Mild cortical irregularity about the left scapula in keeping with known osseous metastasis. The known proximal right humerus  fracture is excluded from this study. The remaining known osseous metastases are not well visualized on this study. IMPRESSION: 1. No evidence of pneumothorax after left thoracentesis. 2. Persistent large left pleural effusion and left lung atelectatic changes. Electronically Signed   By: Ruthann Cancer MD   On: 01/13/2021 12:17   DG Chest 1 View  Result Date: 01/06/2021 CLINICAL DATA:  Post LEFT thoracentesis removal of 1.2 L by report. EXAM: CHEST  1 VIEW COMPARISON:  CT chest from January 2022 and chest x-ray from January 05, 2021. FINDINGS: Cardiomediastinal contours largely obscured by persistent opacification of LEFT hemithorax. Trachea midline. Crescentic graded opacity along the LEFT hemithorax suggest residual pleural fluid despite removal of 1.2 L. RIGHT lung is clear. No sign of pneumothorax on the LEFT following thoracentesis. Suspected pathologic fracture of the RIGHT proximal humerus is not visible on the current study. Changes of RIGHT axillary dissection. EKG leads project over the chest. Calcification along the tract of previous catheter projects over the LEFT neck. IMPRESSION: 1. No sign of pneumothorax on the LEFT following LEFT thoracentesis with improved aeration but with persistent opacification of LEFT hemithorax with residual airspace disease and moderately large pleural effusion. 2. Suspected pathologic fracture RIGHT proximal humerus not visible on the current study. Electronically Signed   By: Zetta Bills M.D.   On: 01/06/2021 13:38   DG Chest 2 View  Result Date: 01/05/2021 CLINICAL DATA:  Worsening shortness of breath with history of right-sided metastatic breast cancer and lung cancer. EXAM: CHEST - 2 VIEW COMPARISON:  November 21, 2020  FINDINGS: There is complete opacification of the left hemithorax. This is increased in severity when compared to the prior study. The right lung is clear. The heart size and mediastinal contours are within normal limits. Marked severity  calcification of the aortic arch is noted. Radiopaque surgical clips are seen overlying the lateral aspect of the right hemithorax. A linear cortical defect is seen overlying the proximal shaft of the right humerus. This represents a new finding when compared to the prior study. IMPRESSION: 1. Complete opacification of the left hemithorax, likely secondary to an extensive amount of atelectasis, infiltrate and associated pleural effusion. Correlation with chest CT is recommended. 2. Additional findings suggestive of a nondisplaced fracture of the proximal right humerus. Correlation with physical examination and patient history is recommended. Electronically Signed   By: Virgina Norfolk M.D.   On: 01/05/2021 17:41   MR HUMERUS RIGHT W WO CONTRAST  Result Date: 01/07/2021 CLINICAL DATA:  Pathologic proximal humerus fracture. History of metastatic breast cancer. EXAM: MRI OF THE RIGHT SHOULDER WITHOUT AND WITH CONTRAST; MRI OF THE RIGHT HUMERUS WITHOUT AND WITH CONTRAST TECHNIQUE: Multiplanar, multisequence MR imaging of the right shoulder and humerus was performed before and after the administration of intravenous contrast. CONTRAST:  51mL GADAVIST GADOBUTROL 1 MMOL/ML IV SOLN COMPARISON:  Right humerus x-rays from yesterday. Right shoulder x-rays dated October 24, 2020. FINDINGS: Rotator cuff:  Intact rotator cuff.  Moderate tendinosis. Muscles: Diffuse muscle edema about the right shoulder, presumably related to prior radiation. No significant atrophy. Biceps long head:  Intact and normally positioned. Acromioclavicular Joint: Moderate arthropathy of the acromioclavicular joint. Type I acromion. No subacromial/subdeltoid bursal fluid. Glenohumeral Joint: Small joint effusion.  No chondral defect. Labrum:  Intact. Bones: Large marrow replacing lesion involving the majority of the humeral head and proximal metadiaphysis, spanning a length of approximately 8.5 cm, best appreciated on the sagittal T1 images. There  is a subacute mildly displaced pathologic fracture of the proximal metadiaphysis with 5 mm anterior displacement. There is a small amount of subperiosteal fluid. No additional metastatic lesions involving the right upper arm. Incompletely evaluated metastases involving multiple thoracic vertebral bodies. Other: Scattered soft tissue swelling of the right upper arm. Partially visualized left pleural effusion. IMPRESSION: 1. Large osseous metastasis involving the majority of the humeral head and proximal metadiaphysis, spanning a length of approximately 8.5 cm. Associated subacute mildly displaced pathologic fracture of the proximal metadiaphysis. 2. No additional metastatic lesions involving the right upper arm. 3. Incompletely evaluated thoracic vertebral metastatic disease. 4. Intact rotator cuff with moderate tendinosis. Electronically Signed   By: Titus Dubin M.D.   On: 01/07/2021 13:18   MR SHOULDER RIGHT W WO CONTRAST  Result Date: 01/07/2021 CLINICAL DATA:  Pathologic proximal humerus fracture. History of metastatic breast cancer. EXAM: MRI OF THE RIGHT SHOULDER WITHOUT AND WITH CONTRAST; MRI OF THE RIGHT HUMERUS WITHOUT AND WITH CONTRAST TECHNIQUE: Multiplanar, multisequence MR imaging of the right shoulder and humerus was performed before and after the administration of intravenous contrast. CONTRAST:  43mL GADAVIST GADOBUTROL 1 MMOL/ML IV SOLN COMPARISON:  Right humerus x-rays from yesterday. Right shoulder x-rays dated October 24, 2020. FINDINGS: Rotator cuff:  Intact rotator cuff.  Moderate tendinosis. Muscles: Diffuse muscle edema about the right shoulder, presumably related to prior radiation. No significant atrophy. Biceps long head:  Intact and normally positioned. Acromioclavicular Joint: Moderate arthropathy of the acromioclavicular joint. Type I acromion. No subacromial/subdeltoid bursal fluid. Glenohumeral Joint: Small joint effusion.  No chondral defect. Labrum:  Intact. Bones:  Large  marrow replacing lesion involving the majority of the humeral head and proximal metadiaphysis, spanning a length of approximately 8.5 cm, best appreciated on the sagittal T1 images. There is a subacute mildly displaced pathologic fracture of the proximal metadiaphysis with 5 mm anterior displacement. There is a small amount of subperiosteal fluid. No additional metastatic lesions involving the right upper arm. Incompletely evaluated metastases involving multiple thoracic vertebral bodies. Other: Scattered soft tissue swelling of the right upper arm. Partially visualized left pleural effusion. IMPRESSION: 1. Large osseous metastasis involving the majority of the humeral head and proximal metadiaphysis, spanning a length of approximately 8.5 cm. Associated subacute mildly displaced pathologic fracture of the proximal metadiaphysis. 2. No additional metastatic lesions involving the right upper arm. 3. Incompletely evaluated thoracic vertebral metastatic disease. 4. Intact rotator cuff with moderate tendinosis. Electronically Signed   By: Titus Dubin M.D.   On: 01/07/2021 13:18   DG CHEST PORT 1 VIEW  Result Date: 01/10/2021 CLINICAL DATA:  Pleural effusion with cough.  Breast carcinoma EXAM: PORTABLE CHEST 1 VIEW COMPARISON:  January 06, 2021 chest radiograph and right humerus radiographs FINDINGS: Most of the left hemithorax remains opacified, likely due to combination of pleural effusion and atelectasis/consolidation throughout most of the left lung. Mild aeration remains in a portion of the left upper lobe. The right lung is clear. Heart is prominent, stable, with normal appearing pulmonary vascularity on the right. Pulmonary vascularity on the left is obscured by apparent effusion and infiltrate. Surgical clips noted in the right axilla. Ill-defined mixed sclerotic and lytic changes noted in the left glenoid region. Apparent pathologic fracture proximal right humerus again noted. IMPRESSION: Opacification of  most of the left hemithorax, likely due to combination of sizable pleural effusion and areas of atelectasis/consolidation. Right lung clear. Grossly stable cardiac silhouette. Bony metastases noted. Apparent pathologic fracture proximal right humerus, documented previously and not significantly changed. Surgical clips in right axilla. Electronically Signed   By: Lowella Grip III M.D.   On: 01/10/2021 08:41   DG Humerus Right  Result Date: 01/11/2021 CLINICAL DATA:  Right humerus fracture. EXAM: RIGHT HUMERUS - 2+ VIEW COMPARISON:  01/06/2021 FINDINGS: Again noted is a fracture involving the proximal humerus. Intramedullary rod has been placed with 3 proximal interlocking screws and 2 distal interlocking screws. Improved alignment of the humerus with near anatomic alignment. Right shoulder appears located on these views. Extensive degenerative changes at the right [facility] joint. IMPRESSION: Internal fixation of the proximal right humerus fracture. Improved alignment of the humerus. Electronically Signed   By: Markus Daft M.D.   On: 01/11/2021 16:43   DG Humerus Right  Result Date: 01/11/2021 CLINICAL DATA:  Proximal humerus fracture. EXAM: RIGHT HUMERUS - 2+ VIEW COMPARISON:  01/06/2021. FINDINGS: Intraoperative images demonstrate an intramedullary rod in the humerus with 3 proximal interlocking screws. Two distal interlocking screws. IMPRESSION: Internal fixation of the right humerus fracture. Electronically Signed   By: Markus Daft M.D.   On: 01/11/2021 16:37   DG Humerus Right  Result Date: 01/06/2021 CLINICAL DATA:  Possible fracture on chest radiograph. Pain for several days. No known injury. EXAM: RIGHT HUMERUS - 2+ VIEW COMPARISON:  10/24/2020 right shoulder radiographs. FINDINGS: Transverse fracture of the proximal right humeral neck with mild lateral angulation of the distal fracture fragment. The fracture appears to be old with callus formation present although union is incomplete. Mild poorly  defined decreased attenuation in the bone at the level of the fracture may indicate a pathologic fracture.  The lesion was demonstrated in the proximal right humerus on the previous shoulder study. No expansile changes. Soft tissues are unremarkable. IMPRESSION: Transverse pathologic fracture of the proximal right humerus with mild angulation. Callus formation is present without complete union. Electronically Signed   By: Lucienne Capers M.D.   On: 01/06/2021 00:28   DG C-Arm 1-60 Min-No Report  Result Date: 01/11/2021 Fluoroscopy was utilized by the requesting physician.  No radiographic interpretation.   US THORACENTESIS ASP PLEURAL SPACE W/IMG GUIDE  Result Date: 01/13/2021 INDICATION: Patient with history of metastatic breast carcinoma, dyspnea, recurrent malignant left pleural effusion. Request received for diagnostic and therapeutic left thoracentesis. EXAM: ULTRASOUND GUIDED DIAGNOSTIC AND THERAPEUTIC LEFT THORACENTESIS MEDICATIONS: 1% lidocaine to skin and subcutaneous tissue COMPLICATIONS: None immediate. PROCEDURE: An ultrasound guided thoracentesis was thoroughly discussed with the patient and questions answered. The benefits, risks, alternatives and complications were also discussed. The patient understands and wishes to proceed with the procedure. Written consent was obtained. Ultrasound was performed to localize and mark an adequate pocket of fluid in the left chest. The area was then prepped and draped in the normal sterile fashion. 1% Lidocaine was used for local anesthesia. Under ultrasound guidance a 6 Fr Safe-T-Centesis catheter was introduced. Thoracentesis was performed. The catheter was removed and a dressing applied. FINDINGS: A total of approximately 570 cc of dark, bloody fluid was removed. Samples were sent to the laboratory as requested by the clinical team. Due to patient chest discomfort only the above amount of fluid was removed today. IMPRESSION: Successful ultrasound guided  diagnostic and therapeutic left thoracentesis yielding 570 cc of pleural fluid. Read by: Rowe Robert, PA-C Electronically Signed   By: Ruthann Cancer MD   On: 01/13/2021 11:50   US THORACENTESIS ASP PLEURAL SPACE W/IMG GUIDE  Result Date: 01/06/2021 INDICATION: Patient with history of metastatic breast carcinoma, dyspnea, recurrent malignant left pleural effusion; request received for diagnostic and therapeutic left thoracentesis. EXAM: ULTRASOUND GUIDED DIAGNOSTIC AND THERAPEUTIC LEFT THORACENTESIS MEDICATIONS: 1% lidocaine to skin and subcutaneous tissue COMPLICATIONS: None immediate. PROCEDURE: An ultrasound guided thoracentesis was thoroughly discussed with the patient and questions answered. The benefits, risks, alternatives and complications were also discussed. The patient understands and wishes to proceed with the procedure. Written consent was obtained. Ultrasound was performed to localize and mark an adequate pocket of fluid in the left chest. The area was then prepped and draped in the normal sterile fashion. 1% Lidocaine was used for local anesthesia. Under ultrasound guidance a 6 Fr Safe-T-Centesis catheter was introduced. Thoracentesis was performed. The catheter was removed and a dressing applied. FINDINGS: A total of approximately 1.2 liters of blood-tinged fluid was removed. Samples were sent to the laboratory as requested by the clinical team. Due to patient chest discomfort only the above amount of fluid was removed today. IMPRESSION: Successful ultrasound guided diagnostic and therapeutic left thoracentesis yielding 1.2 liters of pleural fluid. Read by: Rowe Robert, PA-C Electronically Signed   By: Ruthann Cancer MD   On: 01/06/2021 13:14     Antimicrobials:   NONE CURRENTLY    Subjective: Patient reports still in pain although somewhat improved from yesterday Discussed Pleurx catheter, patient reported not yet interested at this point  Objective: Vitals:   01/12/21 2051  01/13/21 0557 01/13/21 1120 01/13/21 1138  BP: (!) 105/51 (!) 152/86 137/65 119/62  Pulse: 94 97    Resp: 20 20    Temp: 98.4 F (36.9 C) 98.2 F (36.8 C)    TempSrc: Oral Oral  SpO2: 98% 98%    Weight:      Height:        Intake/Output Summary (Last 24 hours) at 01/13/2021 1316 Last data filed at 01/13/2021 0600 Gross per 24 hour  Intake 0 ml  Output 1500 ml  Net -1500 ml   Filed Weights   01/05/21 1508 01/11/21 1301  Weight: 99.3 kg 99.3 kg    Examination:  General exam: Appears calm and comfortable chronically ill-appearing Respiratory system: Decreased breath sounds left side, rales bilaterally Cardiovascular system: S1 & S2 heard, RRR. No JVD, murmurs, rubs, gallops or clicks. No pedal edema. Gastrointestinal system: Abdomen is nondistended, soft and nontender. No organomegaly or masses felt. Normal bowel sounds heard. Central nervous system: Alert and oriented. No focal neurological deficits. Extremities: Warm well perfused Skin: No rashes, lesions or ulcers Psychiatry: Judgement and insight appear normal. Mood & affect appropriate given patient's complex medical history.     Data Reviewed: I have personally reviewed following labs and imaging studies  CBC: Recent Labs  Lab 01/07/21 0441 01/08/21 0557 01/09/21 0543 01/10/21 0902 01/11/21 0454 01/12/21 0450 01/13/21 0456  WBC 1.5* 2.3* 3.4* 4.6 5.5 8.7 10.2  NEUTROABS 0.2* 0.1* 1.1* 1.4* 2.2  --   --   HGB 6.5* 8.4* 8.7* 9.1* 8.6* 9.6* 8.5*  HCT 21.2* 26.5* 27.6* 29.9* 28.9* 31.8* 27.3*  MCV 100.5* 96.0 96.8 99.7 100.0 100.0 96.1  PLT 577* 654* 667* 653* 632* 560* 086*   Basic Metabolic Panel: Recent Labs  Lab 01/07/21 0441 01/08/21 0557 01/10/21 0434 01/11/21 0454 01/12/21 0450 01/13/21 0456  NA 134* 138 133* 136 137 136  K 5.0 5.1 4.9 4.4 5.2* 4.4  CL 98 102 97* 98 100 98  CO2 29 25 29 29 30 30   GLUCOSE 98 98 100* 114* 154* 127*  BUN 21 18 12 10 14 13   CREATININE 1.21* 1.00 0.87 0.97 0.80  0.75  CALCIUM 8.5* 8.4* 8.7* 9.2 9.5 9.2  MG 1.8 1.7 1.5* 1.8 1.9 1.7  PHOS 2.4* 3.5 2.6  --  3.2  --    GFR: Estimated Creatinine Clearance: 81.3 mL/min (by C-G formula based on SCr of 0.75 mg/dL). Liver Function Tests: Recent Labs  Lab 01/08/21 0557 01/12/21 0450 01/13/21 0456  AST 24 27 26   ALT 9 12 12   ALKPHOS 83 88 85  BILITOT 0.9 0.4 0.6  PROT 5.2* 5.8* 5.9*  ALBUMIN 2.2* 2.6* 2.6*   No results for input(s): LIPASE, AMYLASE in the last 168 hours. No results for input(s): AMMONIA in the last 168 hours. Coagulation Profile: Recent Labs  Lab 01/10/21 0434  INR 1.1   Cardiac Enzymes: No results for input(s): CKTOTAL, CKMB, CKMBINDEX, TROPONINI in the last 168 hours. BNP (last 3 results) No results for input(s): PROBNP in the last 8760 hours. HbA1C: No results for input(s): HGBA1C in the last 72 hours. CBG: Recent Labs  Lab 01/12/21 1202 01/12/21 1641 01/12/21 2026 01/13/21 0723 01/13/21 1208  GLUCAP 189* 129* 141* 111* 109*   Lipid Profile: No results for input(s): CHOL, HDL, LDLCALC, TRIG, CHOLHDL, LDLDIRECT in the last 72 hours. Thyroid Function Tests: No results for input(s): TSH, T4TOTAL, FREET4, T3FREE, THYROIDAB in the last 72 hours. Anemia Panel: No results for input(s): VITAMINB12, FOLATE, FERRITIN, TIBC, IRON, RETICCTPCT in the last 72 hours. Sepsis Labs: Recent Labs  Lab 01/07/21 0441  PROCALCITON <0.10    Recent Results (from the past 240 hour(s))  Resp Panel by RT-PCR (Flu A&B, Covid) Nasopharyngeal Swab  Status: None   Collection Time: 01/05/21  4:20 PM   Specimen: Nasopharyngeal Swab; Nasopharyngeal(NP) swabs in vial transport medium  Result Value Ref Range Status   SARS Coronavirus 2 by RT PCR NEGATIVE NEGATIVE Final    Comment: (NOTE) SARS-CoV-2 target nucleic acids are NOT DETECTED.  The SARS-CoV-2 RNA is generally detectable in upper respiratory specimens during the acute phase of infection. The lowest concentration of  SARS-CoV-2 viral copies this assay can detect is 138 copies/mL. A negative result does not preclude SARS-Cov-2 infection and should not be used as the sole basis for treatment or other patient management decisions. A negative result may occur with  improper specimen collection/handling, submission of specimen other than nasopharyngeal swab, presence of viral mutation(s) within the areas targeted by this assay, and inadequate number of viral copies(<138 copies/mL). A negative result must be combined with clinical observations, patient history, and epidemiological information. The expected result is Negative.  Fact Sheet for Patients:  EntrepreneurPulse.com.au  Fact Sheet for Healthcare Providers:  IncredibleEmployment.be  This test is no t yet approved or cleared by the [place] FDA and  has been authorized for detection and/or diagnosis of SARS-CoV-2 by FDA under an Emergency Use Authorization (EUA). This EUA will remain  in effect (meaning this test can be used) for the duration of the COVID-19 declaration under Section 564(b)(1) of the Act, 21 U.S.C.section 360bbb-3(b)(1), unless the authorization is terminated  or revoked sooner.       Influenza A by PCR NEGATIVE NEGATIVE Final   Influenza B by PCR NEGATIVE NEGATIVE Final    Comment: (NOTE) The Xpert Xpress SARS-CoV-2/FLU/RSV plus assay is intended as an aid in the diagnosis of influenza from Nasopharyngeal swab specimens and should not be used as a sole basis for treatment. Nasal washings and aspirates are unacceptable for Xpert Xpress SARS-CoV-2/FLU/RSV testing.  Fact Sheet for Patients: EntrepreneurPulse.com.au  Fact Sheet for Healthcare Providers: IncredibleEmployment.be  This test is not yet approved or cleared by the [place] FDA and has been authorized for detection and/or diagnosis of SARS-CoV-2 by FDA under an Emergency Use  Authorization (EUA). This EUA will remain in effect (meaning this test can be used) for the duration of the COVID-19 declaration under Section 564(b)(1) of the Act, 21 U.S.C. section 360bbb-3(b)(1), unless the authorization is terminated or revoked.  Performed at [facility], [place] [address], [place], [place] [address]   Urine culture     Status: Abnormal   Collection Time: 01/05/21 10:00 PM   Specimen: Urine, Random  Result Value Ref Range Status   Specimen Description   Final    URINE, RANDOM Performed at [place] [address] [place], [place] [address]    Special Requests   Final    NONE Performed at [facility], [place] [address] [place], [place] [address]    Culture 20,000 COLONIES/mL ESCHERICHIA COLI (A)  Final   Report Status 01/08/2021 FINAL  Final   Organism ID, Bacteria ESCHERICHIA COLI (A)  Final      Susceptibility   Escherichia coli - MIC*    AMPICILLIN <=2 SENSITIVE Sensitive     CEFAZOLIN <=4 SENSITIVE Sensitive     CEFEPIME <=0.12 SENSITIVE Sensitive     CEFTRIAXONE <=0.25 SENSITIVE Sensitive     CIPROFLOXACIN <=0.25 SENSITIVE Sensitive     GENTAMICIN <=1 SENSITIVE Sensitive     IMIPENEM <=0.25 SENSITIVE Sensitive     NITROFURANTOIN <=16 SENSITIVE Sensitive     TRIMETH/SULFA <=20  SENSITIVE Sensitive     AMPICILLIN/SULBACTAM <=2 SENSITIVE Sensitive     PIP/TAZO <=4 SENSITIVE Sensitive     * 20,000 COLONIES/mL ESCHERICHIA COLI  Culture, blood (routine x 2)     Status: None   Collection Time: 01/06/21  1:34 AM   Specimen: BLOOD  Result Value Ref Range Status   Specimen Description   Final    BLOOD BLOOD LEFT HAND Performed at [place] [address], [place], [place] [address]    Special Requests   Final    BOTTLES DRAWN AEROBIC ONLY Blood Culture adequate volume Performed at [place] [address] [place], [place] [address]    Culture    Final    NO GROWTH 5 DAYS Performed at [facility], [place] [address], [place], [place] [address]    Report Status 01/11/2021 FINAL  Final  Culture, blood (routine x 2)     Status: None   Collection Time: 01/06/21  1:34 AM   Specimen: BLOOD  Result Value Ref Range Status   Specimen Description   Final    BLOOD BLOOD LEFT WRIST Performed at [place] [address] [place], [place] [address]    Special Requests   Final    BOTTLES DRAWN AEROBIC ONLY Blood Culture adequate volume Performed at [facility] [address], [place], [place] [address]    Culture   Final    NO GROWTH 5 DAYS Performed at [facility], [place] [address] [place], [place] [address]    Report Status 01/11/2021 FINAL  Final  MRSA PCR Screening     Status: None   Collection Time: 01/06/21  6:22 AM   Specimen: Nasal Mucosa; Nasopharyngeal  Result Value Ref Range Status   MRSA by PCR NEGATIVE NEGATIVE Final    Comment:        The GeneXpert MRSA Assay (FDA approved for NASAL specimens only), is one component of a comprehensive MRSA colonization surveillance program. It is not intended to diagnose MRSA infection nor to guide or monitor treatment for MRSA infections. Performed at [facility], [place] [address], [place], [place] [address]   Fungus Culture With Stain     Status: None (Preliminary result)   Collection Time: 01/06/21  1:03 PM   Specimen: PATH Cytology Pleural fluid  Result Value Ref Range Status   Fungus Stain Final report  Final    Comment: (NOTE) Performed At: [facility] [address] [place], [place] [id number] Rush Farmer MD [id number]    Fungus (Mycology) Culture PENDING  Incomplete   Fungal Source PLEURAL  Final    Comment: Performed at [facility], [place] [address], [place], [place] [address]  Acid Fast Smear (AFB)     Status: None   Collection Time: 01/06/21  1:03 PM    Specimen: PATH Cytology Pleural fluid  Result Value Ref Range Status   AFB Specimen Processing Concentration  Final   Acid Fast Smear Negative  Final    Comment: (NOTE) Performed At: [facility] [place], [place] [id number] Rush Farmer MD [id number]    Source (AFB) PLEURAL  Final    Comment: Performed at [facility], [place] [address], [place], [place] [address]  Body fluid culture w Gram Stain     Status: None   Collection Time: 01/06/21  1:03 PM   Specimen: PATH Cytology Pleural fluid  Result Value Ref Range  Status   Specimen Description   Final    PLEURAL Performed at [place] [address] [place], [place] [address]    Special Requests   Final    NONE Performed at [facility], [place] [address] [place], [place] [address]    Gram Stain NO WBC SEEN NO ORGANISMS SEEN   Final   Culture   Final    NO GROWTH 3 DAYS Performed at [facility], [place] [address] [place], [place] [address]    Report Status 01/10/2021 FINAL  Final  Fungus Culture Result     Status: None   Collection Time: 01/06/21  1:03 PM  Result Value Ref Range Status   Result 1 Comment  Final    Comment: (NOTE) KOH/Calcofluor preparation:  no fungus observed. Performed At: [facility] [place], [place] [id number] Rush Farmer MD [id number]   Surgical PCR screen     Status: None   Collection Time: 01/08/21  5:23 PM   Specimen: Nasal Mucosa; Nasal Swab  Result Value Ref Range Status   MRSA, PCR NEGATIVE NEGATIVE Final   Staphylococcus aureus NEGATIVE NEGATIVE Final    Comment: (NOTE) The Xpert SA Assay (FDA approved for NASAL specimens in patients 72 years of age and older), is one component of a comprehensive surveillance program. It is not intended to diagnose infection nor to guide or monitor treatment. Performed at [facility] [place] [address] [place], [place] [address]          Radiology Studies: DG Chest 1 View  Result Date: 01/13/2021 CLINICAL DATA:  65 year old female with history of left pleural effusion, metastatic breast cancer. Status post left thoracentesis. EXAM: CHEST  1 VIEW COMPARISON:  01/10/2021 FINDINGS: The left cardiomediastinal silhouette is obscured, similar to comparison. Similar appearing large left pleural effusion and associated left lung atelectasis, likely mostly passive secondary to effusion. The right lung is clear. No evidence of pneumothorax. Surgical clips in the right axilla, unchanged. Mild cortical irregularity about the left scapula in keeping with known osseous metastasis. The known proximal right humerus fracture is excluded from this study. The remaining known osseous metastases are not well visualized on this study. IMPRESSION: 1. No evidence of pneumothorax after left thoracentesis. 2. Persistent large left pleural effusion and left lung atelectatic changes. Electronically Signed   By: Ruthann Cancer MD   On: 01/13/2021 12:17   DG Humerus Right  Result Date: 01/11/2021 CLINICAL DATA:  Right humerus fracture. EXAM: RIGHT HUMERUS - 2+ VIEW COMPARISON:  01/06/2021 FINDINGS: Again noted is a fracture involving the proximal humerus. Intramedullary rod has been placed with 3 proximal interlocking screws and 2 distal interlocking screws. Improved alignment of the humerus with near anatomic alignment. Right shoulder appears located on these views. Extensive degenerative changes at the right [facility] joint. IMPRESSION: Internal fixation of the proximal right humerus fracture. Improved alignment of the humerus. Electronically Signed   By: Markus Daft M.D.   On: 01/11/2021 16:43   DG Humerus Right  Result Date: 01/11/2021 CLINICAL DATA:  Proximal humerus fracture. EXAM: RIGHT HUMERUS - 2+ VIEW COMPARISON:  01/06/2021. FINDINGS: Intraoperative images demonstrate an intramedullary rod in the humerus with 3 proximal  interlocking screws. Two distal interlocking screws. IMPRESSION: Internal fixation of the right humerus fracture. Electronically Signed   By: Markus Daft M.D.   On: 01/11/2021 16:37   DG C-Arm 1-60 Min-No Report  Result Date: 01/11/2021 Fluoroscopy was utilized by the  requesting physician.  No radiographic interpretation.   US THORACENTESIS ASP PLEURAL SPACE W/IMG GUIDE  Result Date: 01/13/2021 INDICATION: Patient with history of metastatic breast carcinoma, dyspnea, recurrent malignant left pleural effusion. Request received for diagnostic and therapeutic left thoracentesis. EXAM: ULTRASOUND GUIDED DIAGNOSTIC AND THERAPEUTIC LEFT THORACENTESIS MEDICATIONS: 1% lidocaine to skin and subcutaneous tissue COMPLICATIONS: None immediate. PROCEDURE: An ultrasound guided thoracentesis was thoroughly discussed with the patient and questions answered. The benefits, risks, alternatives and complications were also discussed. The patient understands and wishes to proceed with the procedure. Written consent was obtained. Ultrasound was performed to localize and mark an adequate pocket of fluid in the left chest. The area was then prepped and draped in the normal sterile fashion. 1% Lidocaine was used for local anesthesia. Under ultrasound guidance a 6 Fr Safe-T-Centesis catheter was introduced. Thoracentesis was performed. The catheter was removed and a dressing applied. FINDINGS: A total of approximately 570 cc of dark, bloody fluid was removed. Samples were sent to the laboratory as requested by the clinical team. Due to patient chest discomfort only the above amount of fluid was removed today. IMPRESSION: Successful ultrasound guided diagnostic and therapeutic left thoracentesis yielding 570 cc of pleural fluid. Read by: Rowe Robert, PA-C Electronically Signed   By: Ruthann Cancer MD   On: 01/13/2021 11:50        Scheduled Meds: . acetaminophen  1,000 mg Oral Q8H  . aspirin  81 mg Oral Daily  . docusate sodium   100 mg Oral BID  . fenofibrate  160 mg Oral Daily  . fluticasone  1 spray Each Nare Daily  . gabapentin  300 mg Oral BID  . insulin aspart  0-15 Units Subcutaneous TID WC  . insulin aspart  0-5 Units Subcutaneous QHS  . lidocaine  2 patch Transdermal Q24H  . lidocaine      . lidocaine      . magnesium oxide  400 mg Oral BID  . mouth rinse  15 mL Mouth Rinse BID  . metoprolol succinate  25 mg Oral Daily  . mupirocin ointment  1 application Nasal BID  . pantoprazole  40 mg Oral Daily  . polyethylene glycol  17 g Oral Daily  . pravastatin  20 mg Oral Daily   Continuous Infusions: . methocarbamol (ROBAXIN) IV       LOS: 8 days    Time spent: [place], MD [facility]  If 7PM-7AM, please contact night-coverage  01/13/2021, 1:16 PM

## 2021-01-14 ENCOUNTER — Inpatient Hospital Stay (HOSPITAL_COMMUNITY): Payer: MEDICARE

## 2021-01-14 DIAGNOSIS — J90 Pleural effusion, not elsewhere classified: Secondary | ICD-10-CM | POA: Diagnosis not present

## 2021-01-14 DIAGNOSIS — J9601 Acute respiratory failure with hypoxia: Secondary | ICD-10-CM | POA: Diagnosis not present

## 2021-01-14 DIAGNOSIS — N179 Acute kidney failure, unspecified: Secondary | ICD-10-CM | POA: Diagnosis not present

## 2021-01-14 DIAGNOSIS — M84411A Pathological fracture, right shoulder, initial encounter for fracture: Secondary | ICD-10-CM

## 2021-01-14 LAB — CBC WITH DIFFERENTIAL/PLATELET
Abs Immature Granulocytes: 0.1 10*3/uL — ABNORMAL HIGH (ref 0.00–0.07)
Band Neutrophils: 1 %
Basophils Absolute: 0 10*3/uL (ref 0.0–0.1)
Basophils Relative: 0 %
Eosinophils Absolute: 0 10*3/uL (ref 0.0–0.5)
Eosinophils Relative: 0 %
HCT: 27.5 % — ABNORMAL LOW (ref 36.0–46.0)
Hemoglobin: 8.4 g/dL — ABNORMAL LOW (ref 12.0–15.0)
Lymphocytes Relative: 2 %
Lymphs Abs: 0.2 10*3/uL — ABNORMAL LOW (ref 0.7–4.0)
MCH: 30 pg (ref 26.0–34.0)
MCHC: 30.5 g/dL (ref 30.0–36.0)
MCV: 98.2 fL (ref 80.0–100.0)
Metamyelocytes Relative: 1 %
Monocytes Absolute: 1.1 10*3/uL — ABNORMAL HIGH (ref 0.1–1.0)
Monocytes Relative: 12 %
Neutro Abs: 7.6 10*3/uL (ref 1.7–7.7)
Neutrophils Relative %: 84 %
Platelets: 426 10*3/uL — ABNORMAL HIGH (ref 150–400)
RBC: 2.8 MIL/uL — ABNORMAL LOW (ref 3.87–5.11)
RDW: 17.1 % — ABNORMAL HIGH (ref 11.5–15.5)
WBC: 8.9 10*3/uL (ref 4.0–10.5)
nRBC: 0 % (ref 0.0–0.2)

## 2021-01-14 LAB — GLUCOSE, CAPILLARY
Glucose-Capillary: 105 mg/dL — ABNORMAL HIGH (ref 70–99)
Glucose-Capillary: 101 mg/dL — ABNORMAL HIGH (ref 70–99)

## 2021-01-14 LAB — BASIC METABOLIC PANEL
Anion gap: 10 (ref 5–15)
BUN: 11 mg/dL (ref 8–23)
CO2: 30 mmol/L (ref 22–32)
Calcium: 9 mg/dL (ref 8.9–10.3)
Chloride: 100 mmol/L (ref 98–111)
Creatinine, Ser: 0.73 mg/dL (ref 0.44–1.00)
GFR, Estimated: >60 mL/min (ref >60)
Glucose, Bld: 99 mg/dL (ref 70–99)
Potassium: 4.9 mmol/L (ref 3.5–5.1)
Sodium: 140 mmol/L (ref 135–145)

## 2021-01-14 NOTE — Discharge Summary (Signed)
Physician Discharge Summary  Dustie Brittle ZOX:[id number] DOB: [birth year] DOA: 01/05/2021  PCP: Ronnald Nian, DO  Admit date: 01/05/2021 Discharge date: 01/14/2021  Admitted From: Inpatient Disposition: home  Recommendations for Outpatient Follow-up:  1. Follow up with PCP in 1-2 weeks 2. Follow-up with orthopedics in 1 week  Home Health:No Equipment/Devices: None advised Discharge Condition:Fair CODE STATUS:Full code Diet recommendation: Diabetic diet  Delitha Elms a 65 y.o.femalewith medical history significant forstage IV metastatic breast cancer to lung and boness/p right mastectomy, radiationandchemotherapy, history of malignant pleural effusion requiring thoracentesis, essential hypertension, insulin-dependent type 2 diabetes presented to hospital with difficulty urinating, shortness of breath and worsening tremors. She had thoracentesis on 11/21/2020 with 1.5 L malignant effusion removed. She alsoreportedchronic right-sided shoulder pain that started in January 2022 and has progressively got to the point where she can no longer lift her arm.  In the ED, patient was afebrile, borderline hypotensive, and hadoxygen desaturation down to 78% requiring 2 L of O2.Labs notable for significant neutropenia with WBC of 1, absolute neutrophils 0.3. Hemoglobin 10.4, HCT of 33.7, Na of 128, Creatinine of 2.03 from 1.24. Chest x-ray showed complete opacification of the left hemothorax. Patient was then admitted to hospital for further evaluation and treatment.  During hospitalization, patient persisted to have right upper extremity pain. MRI of the shoulder and humerus was obtained which showed pathological fracture with large tumor burden. Orthopedics was consulted and patient underwentIM nailing on 01/11/2021.Patient continued to have large left malignant pleural effusion requiring 2 L of oxygen. Repeat thoracocentesis has been ordered including physical  therapy today.  Remainder of hospital course as follows  Right shoulder pain/pathologic fracture of proximal right humerus with angulation  Has known metastatic disease to peri-glenoid region of right scapula back in 2019.Now noted to have new humerus pathological fracture. MRI of the right shoulder showed around 8 cm metastatic lesion over the proximal humerus with pathological fracture. Patient underwent IM nailing on 4/13/2022by Dr. Griffin Basil orthopedics. Continue pain management. Patient is postop day 2 able to shower on postop day 3.  Mepilex wound care per per orthopedics.  Patient follow-up closely with orthopedics within 1 week.  Acute hypoxic respiratory failure secondary to malignant left pleural effusion Status post ultrasound-guided thoracocentesis with 1.5 L of fluid removal. Fluid culture negative so far. Cytology of the fluid shows malignant cells consistent with metastatic adenocarcinoma. Repeat chest x-ray from 01/10/2021 showedaccumulationof fluid and opacification left hemithorax, patient was unable to tolerate thoracentesis yesterday secondary pain and deferred procedure Agreed to thoracentesis 4/15 with nearly 600 mL of fluid removed, procedure limited by discomfort.  Radiology noted this will likely be an ongoing complication of thoracentesis for this patient Discussed with the patient Pleurx catheter which he want to hold off for now, if it does recur certainly something worth considering. Patient was also seen by physical therapy and qualified for home O2 at 2 L.  Discussed with case management who will arrange. Patient would like to be discharged today and is stable for discharge given her complex history  Neutropenia Resolved at this time.Latest WBC count of  8.9  Anemia likely secondary to malignancy.Status post 1 unit of packed RBC. Hemoglobin improved after transfusion. Latest hemoglobin of 8.4  Hypotension Initially on presentation. Continue  metoprolol. Blood pressure is stable at this time.  Hyponatremia Resolved. Latest sodium of 140  Hypomagnesemia. Mild. Improved after replacement. Latest magnesium of1.7 repleted with p.o.  Acute kidney injury. Improved after IV fluids. Creatinine of 0.73.  Patient to restart home  medications on discharge with her outpatient PCP to monitor renal function  History of stage IV metastatic breast cancer to lungs and bones Patient follows with oncologist Dr. [name]. Oncology has seen during hospitalization and will need outpatient follow-up after discharge  Chemo-induced peripheral neuropathy Continue gabapentin on discharge. Currently stable  Type 2 diabetes mellitus Patient was placed on sliding scale insulin while in the hospital.  She will continue her home medications on discharge.  Hyperlipidemia 0nstatin, fenofibrate    Discharge Diagnoses:  Principal Problem:   Acute respiratory failure with hypoxia (HCC) Active Problems:   Metastatic breast cancer (HCC)   Bone metastases (HCC)   Lung metastases (HCC)   Peripheral neuropathy due to chemotherapy ([place])   Diabetes mellitus type 2 in obese (HCC)   Pleural effusion   Hypotension   AKI (acute kidney injury) ([place])   Hyponatremia   Neutropenia (HCC)   Shoulder pain, right    Discharge Instructions  Discharge Instructions    Call MD for:  difficulty breathing, headache or visual disturbances   Complete by: As directed    Call MD for:  extreme fatigue   Complete by: As directed    Call MD for:  hives   Complete by: As directed    Call MD for:  persistant dizziness or light-headedness   Complete by: As directed    Call MD for:  persistant nausea and vomiting   Complete by: As directed    Call MD for:  redness, tenderness, or signs of infection (pain, swelling, redness, odor or green/yellow discharge around incision site)   Complete by: As directed    Call MD for:  severe uncontrolled pain   Complete  by: As directed    Call MD for:  temperature >100.4   Complete by: As directed    Diet Carb Modified   Complete by: As directed    Discharge wound care:   Complete by: As directed    Per ortho reinforce mepilex as needed,  close f/up with ortho 1 week   Increase activity slowly   Complete by: As directed      Allergies as of 01/14/2021      Reactions   Morphine And Related Anaphylaxis      Medication List    TAKE these medications   alpelisib 2 x 150 MG Therapy Pack Commonly known as: PIQRAY 300mg  Daily Dose Take 300 mg by mouth daily. Take with food on days 1-28 at the same time daily. Swallow whole, do not crush, chew, or split.   amLODipine 10 MG tablet Commonly known as: NORVASC Take 10 mg by mouth daily.   aspirin 81 MG chewable tablet Chew 81 mg by mouth daily.   Benefiber Plus Calcium Chew Chew 2 tablets by mouth daily.   fenofibrate 145 MG tablet Commonly known as: TRICOR TAKE 1 TABLET BY MOUTH EVERY DAY   fluticasone 50 MCG/ACT nasal spray Commonly known as: FLONASE SPRAY 2 SPRAYS INTO EACH NOSTRIL EVERY DAY What changed: See the new instructions.   gabapentin 300 MG capsule Commonly known as: NEURONTIN Take 1 capsule (300 mg total) by mouth 4 (four) times daily. Pt is only taking 2 capsules a day What changed:   when to take this  additional instructions   ibuprofen 800 MG tablet Commonly known as: ADVIL Take 800 mg by mouth every 8 (eight) hours as needed for moderate pain.   Insulin Admin Supplies Misc Inject 24 Units into the skin daily.   Insulin Pen Needle  31G X 5 MM Misc UAD for Sq inj for DM qd   Lantus SoloStar 100 UNIT/ML Solostar Pen Generic drug: insulin glargine INJECT 24 UNITS INTO THE SKIN DAILY What changed:   how much to take  how to take this  when to take this  additional instructions   losartan 50 MG tablet Commonly known as: COZAAR Take 1 tablet (50 mg total) by mouth 2 (two) times daily.   metoprolol  succinate 25 MG 24 hr tablet Commonly known as: TOPROL-XL TAKE 1 TABLET BY MOUTH EVERY DAY   oxyCODONE 20 mg 12 hr tablet Commonly known as: OXYCONTIN Take 1 tablet (20 mg total) by mouth every 8 (eight) hours.   oxyCODONE 5 MG immediate release tablet Commonly known as: Oxy IR/ROXICODONE Take 1-2 tablets (5-10 mg total) by mouth every 4 (four) hours as needed for severe pain.   pantoprazole 40 MG tablet Commonly known as: PROTONIX TAKE 1 TABLET BY MOUTH EVERY DAY   Pfizer-BioNTech COVID-19 Vacc 30 MCG/0.3ML injection Generic drug: COVID-19 mRNA vaccine (Pfizer) AS DIRECTED   pioglitazone 15 MG tablet Commonly known as: ACTOS TAKE 1 TABLET BY MOUTH EVERY DAY   pravastatin 20 MG tablet Commonly known as: PRAVACHOL TAKE 1 TABLET BY MOUTH EVERY DAY   sitaGLIPtin-metformin 50-1000 MG tablet Commonly known as: JANUMET Take 1 tablet by mouth daily with breakfast.            Discharge Care Instructions  (From admission, onward)         Start     Ordered   01/14/21 0000  Discharge wound care:       Comments: Per ortho reinforce mepilex as needed,  close f/up with ortho 1 week   01/14/21 1127          Allergies  Allergen Reactions  . Morphine And Related Anaphylaxis    Consultations:  Oncology, orthopedics   Procedures/Studies: DG Chest 1 View  Result Date: 01/13/2021 CLINICAL DATA:  65 year old female with history of left pleural effusion, metastatic breast cancer. Status post left thoracentesis. EXAM: CHEST  1 VIEW COMPARISON:  01/10/2021 FINDINGS: The left cardiomediastinal silhouette is obscured, similar to comparison. Similar appearing large left pleural effusion and associated left lung atelectasis, likely mostly passive secondary to effusion. The right lung is clear. No evidence of pneumothorax. Surgical clips in the right axilla, unchanged. Mild cortical irregularity about the left scapula in keeping with known osseous metastasis. The known proximal  right humerus fracture is excluded from this study. The remaining known osseous metastases are not well visualized on this study. IMPRESSION: 1. No evidence of pneumothorax after left thoracentesis. 2. Persistent large left pleural effusion and left lung atelectatic changes. Electronically Signed   By: Ruthann Cancer MD   On: 01/13/2021 12:17   DG Chest 1 View  Result Date: 01/06/2021 CLINICAL DATA:  Post LEFT thoracentesis removal of 1.2 L by report. EXAM: CHEST  1 VIEW COMPARISON:  CT chest from January 2022 and chest x-ray from January 05, 2021. FINDINGS: Cardiomediastinal contours largely obscured by persistent opacification of LEFT hemithorax. Trachea midline. Crescentic graded opacity along the LEFT hemithorax suggest residual pleural fluid despite removal of 1.2 L. RIGHT lung is clear. No sign of pneumothorax on the LEFT following thoracentesis. Suspected pathologic fracture of the RIGHT proximal humerus is not visible on the current study. Changes of RIGHT axillary dissection. EKG leads project over the chest. Calcification along the tract of previous catheter projects over the LEFT neck. IMPRESSION: 1. No sign  of pneumothorax on the LEFT following LEFT thoracentesis with improved aeration but with persistent opacification of LEFT hemithorax with residual airspace disease and moderately large pleural effusion. 2. Suspected pathologic fracture RIGHT proximal humerus not visible on the current study. Electronically Signed   By: Zetta Bills M.D.   On: 01/06/2021 13:38   DG Chest 2 View  Result Date: 01/05/2021 CLINICAL DATA:  Worsening shortness of breath with history of right-sided metastatic breast cancer and lung cancer. EXAM: CHEST - 2 VIEW COMPARISON:  November 21, 2020 FINDINGS: There is complete opacification of the left hemithorax. This is increased in severity when compared to the prior study. The right lung is clear. The heart size and mediastinal contours are within normal limits. Marked  severity calcification of the aortic arch is noted. Radiopaque surgical clips are seen overlying the lateral aspect of the right hemithorax. A linear cortical defect is seen overlying the proximal shaft of the right humerus. This represents a new finding when compared to the prior study. IMPRESSION: 1. Complete opacification of the left hemithorax, likely secondary to an extensive amount of atelectasis, infiltrate and associated pleural effusion. Correlation with chest CT is recommended. 2. Additional findings suggestive of a nondisplaced fracture of the proximal right humerus. Correlation with physical examination and patient history is recommended. Electronically Signed   By: Virgina Norfolk M.D.   On: 01/05/2021 17:41   MR HUMERUS RIGHT W WO CONTRAST  Result Date: 01/07/2021 CLINICAL DATA:  Pathologic proximal humerus fracture. History of metastatic breast cancer. EXAM: MRI OF THE RIGHT SHOULDER WITHOUT AND WITH CONTRAST; MRI OF THE RIGHT HUMERUS WITHOUT AND WITH CONTRAST TECHNIQUE: Multiplanar, multisequence MR imaging of the right shoulder and humerus was performed before and after the administration of intravenous contrast. CONTRAST:  20mL GADAVIST GADOBUTROL 1 MMOL/ML IV SOLN COMPARISON:  Right humerus x-rays from yesterday. Right shoulder x-rays dated October 24, 2020. FINDINGS: Rotator cuff:  Intact rotator cuff.  Moderate tendinosis. Muscles: Diffuse muscle edema about the right shoulder, presumably related to prior radiation. No significant atrophy. Biceps long head:  Intact and normally positioned. Acromioclavicular Joint: Moderate arthropathy of the acromioclavicular joint. Type I acromion. No subacromial/subdeltoid bursal fluid. Glenohumeral Joint: Small joint effusion.  No chondral defect. Labrum:  Intact. Bones: Large marrow replacing lesion involving the majority of the humeral head and proximal metadiaphysis, spanning a length of approximately 8.5 cm, best appreciated on the sagittal T1  images. There is a subacute mildly displaced pathologic fracture of the proximal metadiaphysis with 5 mm anterior displacement. There is a small amount of subperiosteal fluid. No additional metastatic lesions involving the right upper arm. Incompletely evaluated metastases involving multiple thoracic vertebral bodies. Other: Scattered soft tissue swelling of the right upper arm. Partially visualized left pleural effusion. IMPRESSION: 1. Large osseous metastasis involving the majority of the humeral head and proximal metadiaphysis, spanning a length of approximately 8.5 cm. Associated subacute mildly displaced pathologic fracture of the proximal metadiaphysis. 2. No additional metastatic lesions involving the right upper arm. 3. Incompletely evaluated thoracic vertebral metastatic disease. 4. Intact rotator cuff with moderate tendinosis. Electronically Signed   By: Titus Dubin M.D.   On: 01/07/2021 13:18   MR SHOULDER RIGHT W WO CONTRAST  Result Date: 01/07/2021 CLINICAL DATA:  Pathologic proximal humerus fracture. History of metastatic breast cancer. EXAM: MRI OF THE RIGHT SHOULDER WITHOUT AND WITH CONTRAST; MRI OF THE RIGHT HUMERUS WITHOUT AND WITH CONTRAST TECHNIQUE: Multiplanar, multisequence MR imaging of the right shoulder and humerus was performed before and  after the administration of intravenous contrast. CONTRAST:  51mL GADAVIST GADOBUTROL 1 MMOL/ML IV SOLN COMPARISON:  Right humerus x-rays from yesterday. Right shoulder x-rays dated October 24, 2020. FINDINGS: Rotator cuff:  Intact rotator cuff.  Moderate tendinosis. Muscles: Diffuse muscle edema about the right shoulder, presumably related to prior radiation. No significant atrophy. Biceps long head:  Intact and normally positioned. Acromioclavicular Joint: Moderate arthropathy of the acromioclavicular joint. Type I acromion. No subacromial/subdeltoid bursal fluid. Glenohumeral Joint: Small joint effusion.  No chondral defect. Labrum:  Intact.  Bones: Large marrow replacing lesion involving the majority of the humeral head and proximal metadiaphysis, spanning a length of approximately 8.5 cm, best appreciated on the sagittal T1 images. There is a subacute mildly displaced pathologic fracture of the proximal metadiaphysis with 5 mm anterior displacement. There is a small amount of subperiosteal fluid. No additional metastatic lesions involving the right upper arm. Incompletely evaluated metastases involving multiple thoracic vertebral bodies. Other: Scattered soft tissue swelling of the right upper arm. Partially visualized left pleural effusion. IMPRESSION: 1. Large osseous metastasis involving the majority of the humeral head and proximal metadiaphysis, spanning a length of approximately 8.5 cm. Associated subacute mildly displaced pathologic fracture of the proximal metadiaphysis. 2. No additional metastatic lesions involving the right upper arm. 3. Incompletely evaluated thoracic vertebral metastatic disease. 4. Intact rotator cuff with moderate tendinosis. Electronically Signed   By: Titus Dubin M.D.   On: 01/07/2021 13:18   DG CHEST PORT 1 VIEW  Result Date: 01/14/2021 CLINICAL DATA:  Pleural effusion EXAM: PORTABLE CHEST 1 VIEW COMPARISON:  01/13/2021 FINDINGS: There is now complete opacification of the left hemithorax likely reflecting a combination of increasing pleural fluid and atelectatic change. The right lung remains clear. No right effusion. No visible pneumothorax. Cardiomediastinal contours are largely obscured by overlying opacity. Surgical clips in the right axilla. Postsurgical changes from prior 8 per right humeral intramedullary nail placement. Mild appearance of the left scapula compatible known osseous metastasis. Additional irregular osseous metastatic disease seen in multiple ribs as well. IMPRESSION: Complete opacification of the left hemithorax likely reflecting a combination of increasing pleural fluid and atelectatic  change. Electronically Signed   By: Lovena Le M.D.   On: 01/14/2021 05:10   DG CHEST PORT 1 VIEW  Result Date: 01/10/2021 CLINICAL DATA:  Pleural effusion with cough.  Breast carcinoma EXAM: PORTABLE CHEST 1 VIEW COMPARISON:  January 06, 2021 chest radiograph and right humerus radiographs FINDINGS: Most of the left hemithorax remains opacified, likely due to combination of pleural effusion and atelectasis/consolidation throughout most of the left lung. Mild aeration remains in a portion of the left upper lobe. The right lung is clear. Heart is prominent, stable, with normal appearing pulmonary vascularity on the right. Pulmonary vascularity on the left is obscured by apparent effusion and infiltrate. Surgical clips noted in the right axilla. Ill-defined mixed sclerotic and lytic changes noted in the left glenoid region. Apparent pathologic fracture proximal right humerus again noted. IMPRESSION: Opacification of most of the left hemithorax, likely due to combination of sizable pleural effusion and areas of atelectasis/consolidation. Right lung clear. Grossly stable cardiac silhouette. Bony metastases noted. Apparent pathologic fracture proximal right humerus, documented previously and not significantly changed. Surgical clips in right axilla. Electronically Signed   By: Lowella Grip III M.D.   On: 01/10/2021 08:41   DG Humerus Right  Result Date: 01/11/2021 CLINICAL DATA:  Right humerus fracture. EXAM: RIGHT HUMERUS - 2+ VIEW COMPARISON:  01/06/2021 FINDINGS: Again noted is a  fracture involving the proximal humerus. Intramedullary rod has been placed with 3 proximal interlocking screws and 2 distal interlocking screws. Improved alignment of the humerus with near anatomic alignment. Right shoulder appears located on these views. Extensive degenerative changes at the right [facility] joint. IMPRESSION: Internal fixation of the proximal right humerus fracture. Improved alignment of the humerus. Electronically  Signed   By: Markus Daft M.D.   On: 01/11/2021 16:43   DG Humerus Right  Result Date: 01/11/2021 CLINICAL DATA:  Proximal humerus fracture. EXAM: RIGHT HUMERUS - 2+ VIEW COMPARISON:  01/06/2021. FINDINGS: Intraoperative images demonstrate an intramedullary rod in the humerus with 3 proximal interlocking screws. Two distal interlocking screws. IMPRESSION: Internal fixation of the right humerus fracture. Electronically Signed   By: Markus Daft M.D.   On: 01/11/2021 16:37   DG Humerus Right  Result Date: 01/06/2021 CLINICAL DATA:  Possible fracture on chest radiograph. Pain for several days. No known injury. EXAM: RIGHT HUMERUS - 2+ VIEW COMPARISON:  10/24/2020 right shoulder radiographs. FINDINGS: Transverse fracture of the proximal right humeral neck with mild lateral angulation of the distal fracture fragment. The fracture appears to be old with callus formation present although union is incomplete. Mild poorly defined decreased attenuation in the bone at the level of the fracture may indicate a pathologic fracture. The lesion was demonstrated in the proximal right humerus on the previous shoulder study. No expansile changes. Soft tissues are unremarkable. IMPRESSION: Transverse pathologic fracture of the proximal right humerus with mild angulation. Callus formation is present without complete union. Electronically Signed   By: Lucienne Capers M.D.   On: 01/06/2021 00:28   DG C-Arm 1-60 Min-No Report  Result Date: 01/11/2021 Fluoroscopy was utilized by the requesting physician.  No radiographic interpretation.   US THORACENTESIS ASP PLEURAL SPACE W/IMG GUIDE  Result Date: 01/13/2021 INDICATION: Patient with history of metastatic breast carcinoma, dyspnea, recurrent malignant left pleural effusion. Request received for diagnostic and therapeutic left thoracentesis. EXAM: ULTRASOUND GUIDED DIAGNOSTIC AND THERAPEUTIC LEFT THORACENTESIS MEDICATIONS: 1% lidocaine to skin and subcutaneous tissue  COMPLICATIONS: None immediate. PROCEDURE: An ultrasound guided thoracentesis was thoroughly discussed with the patient and questions answered. The benefits, risks, alternatives and complications were also discussed. The patient understands and wishes to proceed with the procedure. Written consent was obtained. Ultrasound was performed to localize and mark an adequate pocket of fluid in the left chest. The area was then prepped and draped in the normal sterile fashion. 1% Lidocaine was used for local anesthesia. Under ultrasound guidance a 6 Fr Safe-T-Centesis catheter was introduced. Thoracentesis was performed. The catheter was removed and a dressing applied. FINDINGS: A total of approximately 570 cc of dark, bloody fluid was removed. Samples were sent to the laboratory as requested by the clinical team. Due to patient chest discomfort only the above amount of fluid was removed today. IMPRESSION: Successful ultrasound guided diagnostic and therapeutic left thoracentesis yielding 570 cc of pleural fluid. Read by: Rowe Robert, PA-C Electronically Signed   By: Ruthann Cancer MD   On: 01/13/2021 11:50   US THORACENTESIS ASP PLEURAL SPACE W/IMG GUIDE  Result Date: 01/06/2021 INDICATION: Patient with history of metastatic breast carcinoma, dyspnea, recurrent malignant left pleural effusion; request received for diagnostic and therapeutic left thoracentesis. EXAM: ULTRASOUND GUIDED DIAGNOSTIC AND THERAPEUTIC LEFT THORACENTESIS MEDICATIONS: 1% lidocaine to skin and subcutaneous tissue COMPLICATIONS: None immediate. PROCEDURE: An ultrasound guided thoracentesis was thoroughly discussed with the patient and questions answered. The benefits, risks, alternatives and complications were also discussed. The  patient understands and wishes to proceed with the procedure. Written consent was obtained. Ultrasound was performed to localize and mark an adequate pocket of fluid in the left chest. The area was then prepped and draped  in the normal sterile fashion. 1% Lidocaine was used for local anesthesia. Under ultrasound guidance a 6 Fr Safe-T-Centesis catheter was introduced. Thoracentesis was performed. The catheter was removed and a dressing applied. FINDINGS: A total of approximately 1.2 liters of blood-tinged fluid was removed. Samples were sent to the laboratory as requested by the clinical team. Due to patient chest discomfort only the above amount of fluid was removed today. IMPRESSION: Successful ultrasound guided diagnostic and therapeutic left thoracentesis yielding 1.2 liters of pleural fluid. Read by: Rowe Robert, PA-C Electronically Signed   By: Ruthann Cancer MD   On: 01/06/2021 13:14       Subjective:   Discharge Exam: Vitals:   01/13/21 1400 01/13/21 2021  BP:    Pulse: 100 98  Resp: 20 20  Temp: 98.3 F (36.8 C) 98.2 F (36.8 C)  SpO2: 98% 96%   Vitals:   01/13/21 1120 01/13/21 1138 01/13/21 1400 01/13/21 2021  BP: 137/65 119/62    Pulse:   100 98  Resp:   20 20  Temp:   98.3 F (36.8 C) 98.2 F (36.8 C)  TempSrc:   Oral Oral  SpO2:   98% 96%  Weight:      Height:        General: Pt is alert, awake, not in acute distress Cardiovascular: RRR, S1/S2 +, no rubs, no gallops Respiratory: CTA bilaterally, no wheezing, no rhonchi Abdominal: Soft, NT, ND, bowel sounds + Extremities: no edema, no cyanosis    The results of significant diagnostics from this hospitalization (including imaging, microbiology, ancillary and laboratory) are listed below for reference.     Microbiology: Recent Results (from the past 240 hour(s))  Resp Panel by RT-PCR (Flu A&B, Covid) Nasopharyngeal Swab     Status: None   Collection Time: 01/05/21  4:20 PM   Specimen: Nasopharyngeal Swab; Nasopharyngeal(NP) swabs in vial transport medium  Result Value Ref Range Status   SARS Coronavirus 2 by RT PCR NEGATIVE NEGATIVE Final    Comment: (NOTE) SARS-CoV-2 target nucleic acids are NOT DETECTED.  The  SARS-CoV-2 RNA is generally detectable in upper respiratory specimens during the acute phase of infection. The lowest concentration of SARS-CoV-2 viral copies this assay can detect is 138 copies/mL. A negative result does not preclude SARS-Cov-2 infection and should not be used as the sole basis for treatment or other patient management decisions. A negative result may occur with  improper specimen collection/handling, submission of specimen other than nasopharyngeal swab, presence of viral mutation(s) within the areas targeted by this assay, and inadequate number of viral copies(<138 copies/mL). A negative result must be combined with clinical observations, patient history, and epidemiological information. The expected result is Negative.  Fact Sheet for Patients:  EntrepreneurPulse.com.au  Fact Sheet for Healthcare Providers:  IncredibleEmployment.be  This test is no t yet approved or cleared by the [place] FDA and  has been authorized for detection and/or diagnosis of SARS-CoV-2 by FDA under an Emergency Use Authorization (EUA). This EUA will remain  in effect (meaning this test can be used) for the duration of the COVID-19 declaration under Section 564(b)(1) of the Act, 21 U.S.C.section 360bbb-3(b)(1), unless the authorization is terminated  or revoked sooner.       Influenza A by PCR NEGATIVE NEGATIVE Final  Influenza B by PCR NEGATIVE NEGATIVE Final    Comment: (NOTE) The Xpert Xpress SARS-CoV-2/FLU/RSV plus assay is intended as an aid in the diagnosis of influenza from Nasopharyngeal swab specimens and should not be used as a sole basis for treatment. Nasal washings and aspirates are unacceptable for Xpert Xpress SARS-CoV-2/FLU/RSV testing.  Fact Sheet for Patients: EntrepreneurPulse.com.au  Fact Sheet for Healthcare Providers: IncredibleEmployment.be  This test is not yet approved or  cleared by the [place] FDA and has been authorized for detection and/or diagnosis of SARS-CoV-2 by FDA under an Emergency Use Authorization (EUA). This EUA will remain in effect (meaning this test can be used) for the duration of the COVID-19 declaration under Section 564(b)(1) of the Act, 21 U.S.C. section 360bbb-3(b)(1), unless the authorization is terminated or revoked.  Performed at [facility], [place] [address], [place], [place] [address]   Urine culture     Status: Abnormal   Collection Time: 01/05/21 10:00 PM   Specimen: Urine, Random  Result Value Ref Range Status   Specimen Description   Final    URINE, RANDOM Performed at [facility] [address], [place], [place] [address]    Special Requests   Final    NONE Performed at [facility], [place] [address], [place], [place] [address]    Culture 20,000 COLONIES/mL ESCHERICHIA COLI (A)  Final   Report Status 01/08/2021 FINAL  Final   Organism ID, Bacteria ESCHERICHIA COLI (A)  Final      Susceptibility   Escherichia coli - MIC*    AMPICILLIN <=2 SENSITIVE Sensitive     CEFAZOLIN <=4 SENSITIVE Sensitive     CEFEPIME <=0.12 SENSITIVE Sensitive     CEFTRIAXONE <=0.25 SENSITIVE Sensitive     CIPROFLOXACIN <=0.25 SENSITIVE Sensitive     GENTAMICIN <=1 SENSITIVE Sensitive     IMIPENEM <=0.25 SENSITIVE Sensitive     NITROFURANTOIN <=16 SENSITIVE Sensitive     TRIMETH/SULFA <=20 SENSITIVE Sensitive     AMPICILLIN/SULBACTAM <=2 SENSITIVE Sensitive     PIP/TAZO <=4 SENSITIVE Sensitive     * 20,000 COLONIES/mL ESCHERICHIA COLI  Culture, blood (routine x 2)     Status: None   Collection Time: 01/06/21  1:34 AM   Specimen: BLOOD  Result Value Ref Range Status   Specimen Description   Final    BLOOD BLOOD LEFT HAND Performed at [place] [address], [employer], [place] [address]    Special Requests   Final    BOTTLES DRAWN AEROBIC ONLY Blood  Culture adequate volume Performed at [facility] [facility] [address], [place], [place] [address]    Culture   Final    NO GROWTH 5 DAYS Performed at [facility], [place] [address] [place], [place] [address]    Report Status 01/11/2021 FINAL  Final  Culture, blood (routine x 2)     Status: None   Collection Time: 01/06/21  1:34 AM   Specimen: BLOOD  Result Value Ref Range Status   Specimen Description   Final    BLOOD BLOOD LEFT WRIST Performed at [place] [address], [place], [place] [address]    Special Requests   Final    BOTTLES DRAWN AEROBIC ONLY Blood Culture adequate volume Performed at [facility] [address], [place], [place] [address]    Culture   Final    NO GROWTH 5 DAYS Performed at [facility],  [address], [place], [place] [address]    Report Status 01/11/2021 FINAL  Final  MRSA PCR Screening     Status: None   Collection Time: 01/06/21  6:22 AM   Specimen: Nasal Mucosa; Nasopharyngeal  Result Value Ref Range Status   MRSA by PCR NEGATIVE NEGATIVE Final    Comment:        The GeneXpert MRSA Assay (FDA approved for NASAL specimens only), is one component of a comprehensive MRSA colonization surveillance program. It is not intended to diagnose MRSA infection nor to guide or monitor treatment for MRSA infections. Performed at [facility], [place] [address], [place], [place] [address]   Fungus Culture With Stain     Status: None (Preliminary result)   Collection Time: 01/06/21  1:03 PM   Specimen: PATH Cytology Pleural fluid  Result Value Ref Range Status   Fungus Stain Final report  Final    Comment: (NOTE) Performed At: [facility] [address] [place], [place] [id number] Rush Farmer MD [id number]    Fungus (Mycology) Culture PENDING  Incomplete   Fungal Source PLEURAL  Final    Comment: Performed at [facility], [place] [address], [place], [place] [address]  Acid Fast Smear (AFB)     Status: None   Collection Time: 01/06/21  1:03 PM   Specimen: PATH Cytology Pleural fluid  Result Value Ref Range Status   AFB Specimen Processing Concentration  Final   Acid Fast Smear Negative  Final    Comment: (NOTE) Performed At: [facility] [place], [place] [id number] Rush Farmer MD NU:[id number]    Source (AFB) PLEURAL  Final    Comment: Performed at [facility], [place] [address] [place] [address], [place], [place] [address]  Body fluid culture w Gram Stain     Status: None   Collection Time: 01/06/21  1:03 PM   Specimen: PATH Cytology Pleural fluid  Result Value Ref Range Status   Specimen Description   Final    PLEURAL Performed at [place] [address], [place], [place] [address]    Special Requests   Final    NONE Performed at [facility], [place] [address] [place], [place] [address]    Gram Stain NO WBC SEEN NO ORGANISMS SEEN   Final   Culture   Final    NO GROWTH 3 DAYS Performed at [facility], [place] [address] [place], [place] [address]    Report Status 01/10/2021 FINAL  Final  Fungus Culture Result     Status: None   Collection Time: 01/06/21  1:03 PM  Result Value Ref Range Status   Result 1 Comment  Final    Comment: (NOTE) KOH/Calcofluor preparation:  no fungus observed. Performed At: [facility] [place], [place] [id number] Rush Farmer MD TK:[id number]   Surgical PCR screen     Status: None   Collection Time: 01/08/21  5:23 PM   Specimen: Nasal Mucosa; Nasal Swab  Result Value Ref Range Status   MRSA, PCR NEGATIVE NEGATIVE Final   Staphylococcus aureus NEGATIVE NEGATIVE Final    Comment: (NOTE) The Xpert SA Assay (FDA approved for NASAL specimens in patients 83 years of age and older), is one component of a comprehensive surveillance program. It is not intended to diagnose  infection nor to guide or monitor treatment. Performed at [facility], [place] [address] [place], [place] [address]   Body  fluid culture w Gram Stain     Status: None (Preliminary result)   Collection Time: 01/13/21 12:09 PM   Specimen: Pleura; Body Fluid  Result Value Ref Range Status   Specimen Description   Final    PLEURAL Performed at [facility] [address], [place], [place] [address]    Special Requests   Final    NONE Performed at [facility], [place] [address] [place], [place] [address]    Gram Stain NO WBC SEEN NO ORGANISMS SEEN   Final   Culture   Final    NO GROWTH < 24 HOURS Performed at [facility], [place] [address] [place], [place] [address]    Report Status PENDING  Incomplete     Labs: BNP (last 3 results) No results for input(s): BNP in the last 8760 hours. Basic Metabolic Panel: Recent Labs  Lab 01/08/21 0557 01/10/21 0434 01/11/21 0454 01/12/21 0450 01/13/21 0456 01/14/21 0449  NA 138 133* 136 137 136 140  K 5.1 4.9 4.4 5.2* 4.4 4.9  CL 102 97* 98 100 98 100  CO2 25 29 29 30 30 30   GLUCOSE 98 100* 114* 154* 127* 99  BUN 18 12 10 14 13 11   CREATININE 1.00 0.87 0.97 0.80 0.75 0.73  CALCIUM 8.4* 8.7* 9.2 9.5 9.2 9.0  MG 1.7 1.5* 1.8 1.9 1.7  --   PHOS 3.5 2.6  --  3.2  --   --    Liver Function Tests: Recent Labs  Lab 01/08/21 0557 01/12/21 0450 01/13/21 0456  AST 24 27 26   ALT 9 12 12   ALKPHOS 83 88 85  BILITOT 0.9 0.4 0.6  PROT 5.2* 5.8* 5.9*  ALBUMIN 2.2* 2.6* 2.6*   No results for input(s): LIPASE, AMYLASE in the last 168 hours. No results for input(s): AMMONIA in the last 168 hours. CBC: Recent Labs  Lab 01/08/21 0557 01/09/21 0543 01/10/21 0902 01/11/21 0454 01/12/21 0450 01/13/21 0456 01/14/21 0449  WBC 2.3* 3.4* 4.6 5.5 8.7 10.2 8.9  NEUTROABS 0.1* 1.1* 1.4* 2.2  --   --  7.6  HGB 8.4* 8.7* 9.1* 8.6* 9.6* 8.5* 8.4*  HCT 26.5* 27.6* 29.9* 28.9* 31.8*  27.3* 27.5*  MCV 96.0 96.8 99.7 100.0 100.0 96.1 98.2  PLT 654* 667* 653* 632* 560* 519* 426*   Cardiac Enzymes: No results for input(s): CKTOTAL, CKMB, CKMBINDEX, TROPONINI in the last 168 hours. BNP: Invalid input(s): POCBNP CBG: Recent Labs  Lab 01/13/21 1208 01/13/21 1657 01/13/21 2015 01/14/21 0721 01/14/21 1115  GLUCAP 109* 112* 140* 105* 101*   D-Dimer No results for input(s): DDIMER in the last 72 hours. Hgb A1c No results for input(s): HGBA1C in the last 72 hours. Lipid Profile No results for input(s): CHOL, HDL, LDLCALC, TRIG, CHOLHDL, LDLDIRECT in the last 72 hours. Thyroid function studies No results for input(s): TSH, T4TOTAL, T3FREE, THYROIDAB in the last 72 hours.  Invalid input(s): FREET3 Anemia work up No results for input(s): VITAMINB12, FOLATE, FERRITIN, TIBC, IRON, RETICCTPCT in the last 72 hours. Urinalysis    Component Value Date/Time   COLORURINE YELLOW 01/05/2021 2200   APPEARANCEUR CLEAR 01/05/2021 2200   LABSPEC 1.009 01/05/2021 2200   PHURINE 5.0 01/05/2021 2200   GLUCOSEU NEGATIVE 01/05/2021 2200   HGBUR NEGATIVE 01/05/2021 2200   BILIRUBINUR NEGATIVE 01/05/2021 2200   KETONESUR NEGATIVE 01/05/2021 2200   PROTEINUR NEGATIVE 01/05/2021 2200   NITRITE NEGATIVE 01/05/2021 2200   LEUKOCYTESUR NEGATIVE 01/05/2021 2200  Sepsis Labs Invalid input(s): PROCALCITONIN,  WBC,  LACTICIDVEN Microbiology Recent Results (from the past 240 hour(s))  Resp Panel by RT-PCR (Flu A&B, Covid) Nasopharyngeal Swab     Status: None   Collection Time: 01/05/21  4:20 PM   Specimen: Nasopharyngeal Swab; Nasopharyngeal(NP) swabs in vial transport medium  Result Value Ref Range Status   SARS Coronavirus 2 by RT PCR NEGATIVE NEGATIVE Final    Comment: (NOTE) SARS-CoV-2 target nucleic acids are NOT DETECTED.  The SARS-CoV-2 RNA is generally detectable in upper respiratory specimens during the acute phase of infection. The lowest concentration of SARS-CoV-2 viral  copies this assay can detect is 138 copies/mL. A negative result does not preclude SARS-Cov-2 infection and should not be used as the sole basis for treatment or other patient management decisions. A negative result may occur with  improper specimen collection/handling, submission of specimen other than nasopharyngeal swab, presence of viral mutation(s) within the areas targeted by this assay, and inadequate number of viral copies(<138 copies/mL). A negative result must be combined with clinical observations, patient history, and epidemiological information. The expected result is Negative.  Fact Sheet for Patients:  EntrepreneurPulse.com.au  Fact Sheet for Healthcare Providers:  IncredibleEmployment.be  This test is no t yet approved or cleared by the [place] FDA and  has been authorized for detection and/or diagnosis of SARS-CoV-2 by FDA under an Emergency Use Authorization (EUA). This EUA will remain  in effect (meaning this test can be used) for the duration of the COVID-19 declaration under Section 564(b)(1) of the Act, 21 U.S.C.section 360bbb-3(b)(1), unless the authorization is terminated  or revoked sooner.       Influenza A by PCR NEGATIVE NEGATIVE Final   Influenza B by PCR NEGATIVE NEGATIVE Final    Comment: (NOTE) The Xpert Xpress SARS-CoV-2/FLU/RSV plus assay is intended as an aid in the diagnosis of influenza from Nasopharyngeal swab specimens and should not be used as a sole basis for treatment. Nasal washings and aspirates are unacceptable for Xpert Xpress SARS-CoV-2/FLU/RSV testing.  Fact Sheet for Patients: EntrepreneurPulse.com.au  Fact Sheet for Healthcare Providers: IncredibleEmployment.be  This test is not yet approved or cleared by the [place] FDA and has been authorized for detection and/or diagnosis of SARS-CoV-2 by FDA under an Emergency Use Authorization (EUA). This  EUA will remain in effect (meaning this test can be used) for the duration of the COVID-19 declaration under Section 564(b)(1) of the Act, 21 U.S.C. section 360bbb-3(b)(1), unless the authorization is terminated or revoked.  Performed at [facility], [place] [address] [place], [place] [address]   Urine culture     Status: Abnormal   Collection Time: 01/05/21 10:00 PM   Specimen: Urine, Random  Result Value Ref Range Status   Specimen Description   Final    URINE, RANDOM Performed at [place] [address], [place], [place] [address]    Special Requests   Final    NONE Performed at [facility], [place] [address], [place], [place] [address]    Culture 20,000 COLONIES/mL ESCHERICHIA COLI (A)  Final   Report Status 01/08/2021 FINAL  Final   Organism ID, Bacteria ESCHERICHIA COLI (A)  Final      Susceptibility   Escherichia coli - MIC*    AMPICILLIN <=2 SENSITIVE Sensitive     CEFAZOLIN <=4 SENSITIVE Sensitive     CEFEPIME <=0.12 SENSITIVE Sensitive     CEFTRIAXONE <=0.25 SENSITIVE Sensitive     CIPROFLOXACIN <=0.25 SENSITIVE  Sensitive     GENTAMICIN <=1 SENSITIVE Sensitive     IMIPENEM <=0.25 SENSITIVE Sensitive     NITROFURANTOIN <=16 SENSITIVE Sensitive     TRIMETH/SULFA <=20 SENSITIVE Sensitive     AMPICILLIN/SULBACTAM <=2 SENSITIVE Sensitive     PIP/TAZO <=4 SENSITIVE Sensitive     * 20,000 COLONIES/mL ESCHERICHIA COLI  Culture, blood (routine x 2)     Status: None   Collection Time: 01/06/21  1:34 AM   Specimen: BLOOD  Result Value Ref Range Status   Specimen Description   Final    BLOOD BLOOD LEFT HAND Performed at [facility] [address] [place], [place] [address]    Special Requests   Final    BOTTLES DRAWN AEROBIC ONLY Blood Culture adequate volume Performed at [facility] [address] [place], [place] [address]    Culture   Final    NO GROWTH 5  DAYS Performed at [facility], [place] [address] [place], [place] [address]    Report Status 01/11/2021 FINAL  Final  Culture, blood (routine x 2)     Status: None   Collection Time: 01/06/21  1:34 AM   Specimen: BLOOD  Result Value Ref Range Status   Specimen Description   Final    BLOOD BLOOD LEFT WRIST Performed at [place] [address], [place], [place] [address]    Special Requests   Final    BOTTLES DRAWN AEROBIC ONLY Blood Culture adequate volume Performed at [facility] [address], [place], [place] [address]    Culture   Final    NO GROWTH 5 DAYS Performed at [facility], [place] [address] [place], [place] [address]    Report Status 01/11/2021 FINAL  Final  MRSA PCR Screening     Status: None   Collection Time: 01/06/21  6:22 AM   Specimen: Nasal Mucosa; Nasopharyngeal  Result Value Ref Range Status   MRSA by PCR NEGATIVE NEGATIVE Final    Comment:        The GeneXpert MRSA Assay (FDA approved for NASAL specimens only), is one component of a comprehensive MRSA colonization surveillance program. It is not intended to diagnose MRSA infection nor to guide or monitor treatment for MRSA infections. Performed at [facility], [place] [address], [place], [place] [address]   Fungus Culture With Stain     Status: None (Preliminary result)   Collection Time: 01/06/21  1:03 PM   Specimen: PATH Cytology Pleural fluid  Result Value Ref Range Status   Fungus Stain Final report  Final    Comment: (NOTE) Performed At: [facility] [address] [place], [place] [id number] Rush Farmer MD [id number]    Fungus (Mycology) Culture PENDING  Incomplete   Fungal Source PLEURAL  Final    Comment: Performed at [facility], [place] [address] [place], [place] [address]  Acid Fast Smear (AFB)     Status: None   Collection Time: 01/06/21  1:03 PM   Specimen: PATH Cytology  Pleural fluid  Result Value Ref Range Status   AFB Specimen Processing Concentration  Final   Acid Fast Smear Negative  Final    Comment: (NOTE) Performed At: [facility] [place], [place] [id number] Rush Farmer MD [id number]    Source (AFB) PLEURAL  Final    Comment: Performed at [facility], [place] [address] [place], [place] [address]  Body  fluid culture w Gram Stain     Status: None   Collection Time: 01/06/21  1:03 PM   Specimen: PATH Cytology Pleural fluid  Result Value Ref Range Status   Specimen Description   Final    PLEURAL Performed at [place] [address] [place], [place] [address]    Special Requests   Final    NONE Performed at [facility], [place] [address] [place], [place] [address]    Gram Stain NO WBC SEEN NO ORGANISMS SEEN   Final   Culture   Final    NO GROWTH 3 DAYS Performed at [facility], [place] [address], [place], [place] [address]    Report Status 01/10/2021 FINAL  Final  Fungus Culture Result     Status: None   Collection Time: 01/06/21  1:03 PM  Result Value Ref Range Status   Result 1 Comment  Final    Comment: (NOTE) KOH/Calcofluor preparation:  no fungus observed. Performed At: [facility] [place], [place] [id number] Rush Farmer MD PR:[id number]   Surgical PCR screen     Status: None   Collection Time: 01/08/21  5:23 PM   Specimen: Nasal Mucosa; Nasal Swab  Result Value Ref Range Status   MRSA, PCR NEGATIVE NEGATIVE Final   Staphylococcus aureus NEGATIVE NEGATIVE Final    Comment: (NOTE) The Xpert SA Assay (FDA approved for NASAL specimens in patients 80 years of age and older), is one component of a comprehensive surveillance program. It is not intended to diagnose infection nor to guide or monitor treatment. Performed at [facility], [place] [address] [place], [place] [address]   Body  fluid culture w Gram Stain     Status: None (Preliminary result)   Collection Time: 01/13/21 12:09 PM   Specimen: Pleura; Body Fluid  Result Value Ref Range Status   Specimen Description   Final    PLEURAL Performed at [place] [address] [place], [place] [address]    Special Requests   Final    NONE Performed at [facility], [place] [address] [place], [place] [address]    Gram Stain NO WBC SEEN NO ORGANISMS SEEN   Final   Culture   Final    NO GROWTH < 24 HOURS Performed at [facility], [place] [address], [place], [place] [address]    Report Status PENDING  Incomplete     Time coordinating discharge: Over 30 minutes  SIGNED:   Nicolette Bang, MD  [facility] 01/14/2021, 11:28 AM Pager   If 7PM-7AM, please contact night-coverage [email or web address] Password [email or web address]

## 2021-01-14 NOTE — Progress Notes (Signed)
PT O2 Note  Patient Details Name: Mia [name] MRN: [id number] DOB: [birth year]     SATURATION QUALIFICATIONS: (This note is used to comply with regulatory documentation for home oxygen)  Patient Saturations on Room Air at Rest = 87%  Patient Saturations on Room Air while Ambulating = not tested as dropped at rest  Patient Saturations on 2 Liters of oxygen while Ambulating = 95%  Please briefly explain why patient needs home oxygen: In order to maintain oxygen saturation at rest and with activity.   Mikael Spray Yasin Ducat 01/14/2021, 10:35 AM

## 2021-01-14 NOTE — Progress Notes (Signed)
Physical Therapy Treatment Patient Details Name: Mia [name] MRN: [id number] DOB: 1956-06-19 Today's Date: 01/14/2021    History of Present Illness Mia [name] is a 65 y.o. female presented to hospital 01/05/21 with difficulty urinating, shortness of breath and worsening tremors.    She also reported chronic right-sided shoulder pain that started in January 2022 and has progressively got to the point where she can no longer lift her arm.Patient had metastatic lesions throughout her proximal and diaphyseal humerus and a pathologic fracture that is now over 3 months old had not gone on to union.  She is S/P ORIF right humeral fx 01/11/21.  Additionally, pt with recurrent pleural effusions requiring thoracentesis on 4/8 and 01/13/21.  Pt with medical history significant for stage IV metastatic breast cancer to lung and bones ,s/p right mastectomy, radiation and chemotherapy, history of malignant pleural effusion requiring thoracentesis, essential hypertension, insulin-dependent type 2 diabetes    PT Comments    Pt with gradual progression of mobility.  She is ambulating 37' with cane and min guard - does require 2 L of O2 to maintain saturation.  Pt with excellent family support and has DME at home. She is hopeful to go home today if she can get home O2 set up - PT entered O2 qualification note and will notify case manager of home O2 needs.    Follow Up Recommendations  Home health PT;Supervision for mobility/OOB     Equipment Recommendations  Other (comment) (home O2)    Recommendations for Other Services       Precautions / Restrictions Precautions Precautions: Fall;Shoulder Type of Shoulder Precautions: AROM to elbow, wrist and hand. allowed to use her hands immediately for ADLs and activities less than 1 pound with early therapy for range of motion of the shoulder active assisted and passive Shoulder Interventions: Shoulder sling/immobilizer;Off for  dressing/bathing/exercises Required Braces or Orthoses: Sling Restrictions RUE Weight Bearing: Non weight bearing    Mobility  Bed Mobility Overal bed mobility: Needs Assistance Bed Mobility: Supine to Sit     Supine to sit: Min assist     General bed mobility comments: Min A pulling up with L UE to sit EOB    Transfers Overall transfer level: Needs assistance Equipment used: Straight cane Transfers: Sit to/from Stand Sit to Stand: Min assist         General transfer comment: Min A to rise; performed x 2  Ambulation/Gait Ambulation/Gait assistance: Min guard Gait Distance (Feet): 80 Feet Assistive device: Straight cane Gait Pattern/deviations: Step-through pattern;Wide base of support     General Gait Details: Increased lateral sway but no LOB; does fatigue easily with DOE of 3/4 that limited distance; ambulated on 2 L O2 with sats 95%   Stairs             Wheelchair Mobility    Modified Rankin (Stroke Patients Only)       Balance Overall balance assessment: Needs assistance Sitting-balance support: Feet supported;No upper extremity supported Sitting balance-Leahy Scale: Good     Standing balance support: Single extremity supported Standing balance-Leahy Scale: Poor Standing balance comment: reliant on UE support - but steady with cane                            Cognition Arousal/Alertness: Awake/alert Behavior During Therapy: WFL for tasks assessed/performed Overall Cognitive Status: Within Functional Limits for tasks assessed  Exercises      General Comments General comments (skin integrity, edema, etc.): Performed O2 qualification test.  Pt's O2 dropping to 87% at rest on RA so did not ambulate on RA.  Required 2 L O2 to ambulate and maintain sats 95%.  On 2 L rest sats 98%      Pertinent Vitals/Pain Pain Assessment: 0-10 Pain Score: 2  Pain Location: right  shoulder Pain Descriptors / Indicators: Discomfort Pain Intervention(s): Limited activity within patient's tolerance;Monitored during session    Home Living                      Prior Function            PT Goals (current goals can now be found in the care plan section) Acute Rehab PT Goals Patient Stated Goal: to go home PT Goal Formulation: With patient/family Time For Goal Achievement: 01/26/21 Potential to Achieve Goals: Good Progress towards PT goals: Progressing toward goals    Frequency    Min 3X/week      PT Plan Current plan remains appropriate    Co-evaluation              AM-PAC PT "6 Clicks" Mobility   Outcome Measure  Help needed turning from your back to your side while in a flat bed without using bedrails?: A Little Help needed moving from lying on your back to sitting on the side of a flat bed without using bedrails?: A Little Help needed moving to and from a bed to a chair (including a wheelchair)?: A Little Help needed standing up from a chair using your arms (e.g., wheelchair or bedside chair)?: A Little Help needed to walk in hospital room?: A Little Help needed climbing 3-5 steps with a railing? : A Little 6 Click Score: 18    End of Session Equipment Utilized During Treatment: Gait belt;Oxygen Activity Tolerance: Patient tolerated treatment well Patient left: with call bell/phone within reach;with family/visitor present;in chair (family attentive and present, no alarm needed) Nurse Communication: Mobility status PT Visit Diagnosis: Unsteadiness on feet (R26.81);Difficulty in walking, not elsewhere classified (R26.2)     Time: 4827-0786 PT Time Calculation (min) (ACUTE ONLY): 20 min  Charges:  $Gait Training: 8-22 mins                     Abran Richard, PT Acute Rehab Services Pager [phone] [facility] [place] 01/14/2021, 10:45 AM

## 2021-01-14 NOTE — TOC Progression Note (Signed)
Transition of Care [facility]) - Progression Note    Patient Details  Name: Mia [name] MRN: [id number] Date of Birth: March 15, 1956  [facility]) CM/SW Contact  Joaquin Courts, RN Phone Number: 01/14/2021, 11:41 AM  Clinical Narrative:    Home oxygen referral given to [employer] rep Jermaine.    Expected Discharge Plan: [place] Barriers to Discharge: Continued Medical Work up  Expected Discharge Plan and Services Expected Discharge Plan: [place]   Discharge Planning Services: CM Consult Post Acute Care Choice: [place] arrangements for the past 2 months: Single Family Home Expected Discharge Date: 01/14/21               DME Arranged: Oxygen DME Agency: Other - Comment [facility]) Date DME Agency Contacted: 01/14/21 Time DME Agency Contacted: 78 Representative spoke with at DME Agency: Brenton Grills HH Arranged: PT,OT [place]: [place] Date [place]: 01/12/21 Time [place]: 1446 Representative spoke with at [place]: [place] ([place]) Interventions    Readmission Risk Interventions No flowsheet data found.

## 2021-01-14 NOTE — Progress Notes (Signed)
   ORTHOPAEDIC PROGRESS NOTE  s/p Procedure(s): INTRAMEDULLARY (IM) NAIL HUMERAL on 01/11/2021 with Dr. Griffin Basil  SUBJECTIVE: Reports minimal pain about operative site. Lymphedema of the right arm - feels like it is better today. Husband at bedside. No other complaints.   OBJECTIVE: PE: General: sitting up in hospital bed, NAD RUE: Dressings CDI.  Lymphedema of right forearm and hand.  + Motor in  AIN, PIN, Ulnar distributions. Axillary nerve sensation preserved.  Sensation intact in medial, radial, and ulnar distributions. Well perfused digits.    Vitals:   01/13/21 1400 01/13/21 2021  BP:    Pulse: 100 98  Resp: 20 20  Temp: 98.3 F (36.8 C) 98.2 F (36.8 C)  SpO2: 98% 96%     ASSESSMENT: Mia [name] is a 65 y.o. female doing well postoperatively. POD#3  PLAN: Weightbearing: NWB RUE  Therapy: allowed to use her hands immediately for ADLs and activities less than 1 pound with early therapy for range of motion of the shoulder active assisted and passive Insicional and dressing care: Reinforce dressings as needed New Mepilex dressing placed yesterday. Will change before discharge Orthopedic device(s): Sling Showering: Post-op day #3 with assistance VTE prophylaxis: per primary team, no orthopedic contraindications Pain control: PRN pain medications, preferring oral medications Follow - up plan: 1 week in office Contact information:  Dr. Ophelia Charter, Noemi Chapel PA-C, After hours and holidays please check [email or web address] for group call information for [facility]  Once cleared by medicine team and therapies, okay for discharge from orthopedics standpoint.   Noemi Chapel, PA-C 01/14/2021

## 2021-01-16 ENCOUNTER — Telehealth: Payer: Self-pay

## 2021-01-16 LAB — PATHOLOGIST SMEAR REVIEW

## 2021-01-16 NOTE — Telephone Encounter (Signed)
Transition Care Management Follow-up Telephone Call  Date of discharge and from where: 01/14/2021-[facility]  How have you been since you were released from the hospital? Feeling a little stronger today.  Any questions or concerns? No  Items Reviewed:  Did the pt receive and understand the discharge instructions provided? Yes   Medications obtained and verified? Yes   Other? Yes   Any new allergies since your discharge? No   Dietary orders reviewed? Yes  Do you have support at home? Yes   Home Care and Equipment/Supplies: Were home health services ordered? no If so, what is the name of the agency? n/a  Has the agency set up a time to come to the patient's home? N/A Were any new equipment or medical supplies ordered?  No What is the name of the medical supply agency? n/a Were you able to get the supplies/equipment? not applicable Do you have any questions related to the use of the equipment or supplies? N/A  Functional Questionnaire: (I = Independent and D = Dependent) ADLs: I with assistance  Bathing/Dressing- I with assistance  Meal Prep- D  Eating- I  Maintaining continence- I with assistance  Transferring/Ambulation- I with assistance  Managing Meds- I with assistance  Follow up appointments reviewed:   PCP Hospital f/u appt confirmed? Yes  Scheduled to see Dr. Bryan Lemma on 01/25/21 @ 11:00.  [facility] f/u appt confirmed? Yes  Patient states she has an appt with Ortho-date -unsure  Are transportation arrangements needed? No   If their condition worsens, is the pt aware to call PCP or go to the Emergency Dept.? Yes  Was the patient provided with contact information for the PCP's office or ED? Yes  Was to pt encouraged to call back with questions or concerns? Yes

## 2021-01-16 NOTE — Telephone Encounter (Signed)
Oral Chemotherapy Pharmacist Encounter   Spoke with patient today to follow up regarding patient's oral chemotherapy medication: Piqray (alpelisib)  Medication will be delivered from [place] to patient's home ~01/17/21. Patient knows not to start medication until after she sees Dr. [name] on 01/26/21. Oral Chemotherapy clinic will touch base with patient early next week to discuss initial counseling of medication and answer any questions she may have. Patient expressed appreciation.   Patient knows to call the office with questions or concerns.  Leron Croak, PharmD, BCPS Hematology/Oncology Clinical Pharmacist [facility] [phone] 01/16/2021 2:03 PM

## 2021-01-16 NOTE — Telephone Encounter (Signed)
This LPN called pt to check on her post-surgery. Pt states she is feeling well and is able to move her arm, which was difficult for her before. Pt states her pain is managed with Percocet BID and she is no longer needing Oxycontin. Pt also states she is urinating normal, as well as having normal, consistent BMs. Pt understands to call with any concerns and verbalizes thanks.

## 2021-01-17 LAB — BODY FLUID CULTURE W GRAM STAIN
Culture: NO GROWTH
Gram Stain: NONE SEEN

## 2021-01-18 ENCOUNTER — Encounter (HOSPITAL_COMMUNITY): Payer: Self-pay | Admitting: Orthopaedic Surgery

## 2021-01-18 DIAGNOSIS — C7802 Secondary malignant neoplasm of left lung: Secondary | ICD-10-CM | POA: Diagnosis not present

## 2021-01-18 DIAGNOSIS — C7951 Secondary malignant neoplasm of bone: Secondary | ICD-10-CM | POA: Diagnosis not present

## 2021-01-18 DIAGNOSIS — G63 Polyneuropathy in diseases classified elsewhere: Secondary | ICD-10-CM | POA: Diagnosis not present

## 2021-01-18 DIAGNOSIS — C50911 Malignant neoplasm of unspecified site of right female breast: Secondary | ICD-10-CM | POA: Diagnosis not present

## 2021-01-18 DIAGNOSIS — J9811 Atelectasis: Secondary | ICD-10-CM | POA: Diagnosis not present

## 2021-01-18 DIAGNOSIS — M84521D Pathological fracture in neoplastic disease, right humerus, subsequent encounter for fracture with routine healing: Secondary | ICD-10-CM | POA: Diagnosis not present

## 2021-01-18 DIAGNOSIS — E785 Hyperlipidemia, unspecified: Secondary | ICD-10-CM | POA: Diagnosis not present

## 2021-01-18 DIAGNOSIS — Z6837 Body mass index (BMI) 37.0-37.9, adult: Secondary | ICD-10-CM | POA: Diagnosis not present

## 2021-01-18 DIAGNOSIS — D509 Iron deficiency anemia, unspecified: Secondary | ICD-10-CM | POA: Diagnosis not present

## 2021-01-18 DIAGNOSIS — I890 Lymphedema, not elsewhere classified: Secondary | ICD-10-CM | POA: Diagnosis not present

## 2021-01-18 DIAGNOSIS — I10 Essential (primary) hypertension: Secondary | ICD-10-CM | POA: Diagnosis not present

## 2021-01-18 DIAGNOSIS — J910 Malignant pleural effusion: Secondary | ICD-10-CM | POA: Diagnosis not present

## 2021-01-18 DIAGNOSIS — I959 Hypotension, unspecified: Secondary | ICD-10-CM | POA: Diagnosis not present

## 2021-01-18 DIAGNOSIS — E119 Type 2 diabetes mellitus without complications: Secondary | ICD-10-CM | POA: Diagnosis not present

## 2021-01-18 DIAGNOSIS — M170 Bilateral primary osteoarthritis of knee: Secondary | ICD-10-CM | POA: Diagnosis not present

## 2021-01-18 DIAGNOSIS — J9601 Acute respiratory failure with hypoxia: Secondary | ICD-10-CM | POA: Diagnosis not present

## 2021-01-18 DIAGNOSIS — N179 Acute kidney failure, unspecified: Secondary | ICD-10-CM | POA: Diagnosis not present

## 2021-01-18 DIAGNOSIS — E669 Obesity, unspecified: Secondary | ICD-10-CM | POA: Diagnosis not present

## 2021-01-18 DIAGNOSIS — D630 Anemia in neoplastic disease: Secondary | ICD-10-CM | POA: Diagnosis not present

## 2021-01-18 DIAGNOSIS — G620 Drug-induced polyneuropathy: Secondary | ICD-10-CM | POA: Diagnosis not present

## 2021-01-18 DIAGNOSIS — Z9011 Acquired absence of right breast and nipple: Secondary | ICD-10-CM | POA: Diagnosis not present

## 2021-01-18 DIAGNOSIS — Z7982 Long term (current) use of aspirin: Secondary | ICD-10-CM | POA: Diagnosis not present

## 2021-01-18 DIAGNOSIS — C7801 Secondary malignant neoplasm of right lung: Secondary | ICD-10-CM | POA: Diagnosis not present

## 2021-01-18 DIAGNOSIS — T451X5D Adverse effect of antineoplastic and immunosuppressive drugs, subsequent encounter: Secondary | ICD-10-CM | POA: Diagnosis not present

## 2021-01-19 ENCOUNTER — Other Ambulatory Visit: Payer: MEDICARE

## 2021-01-19 ENCOUNTER — Ambulatory Visit: Payer: MEDICARE

## 2021-01-19 DIAGNOSIS — S42294D Other nondisplaced fracture of upper end of right humerus, subsequent encounter for fracture with routine healing: Secondary | ICD-10-CM | POA: Diagnosis not present

## 2021-01-23 ENCOUNTER — Ambulatory Visit (HOSPITAL_COMMUNITY)
Admission: RE | Admit: 2021-01-23 | Discharge: 2021-01-23 | Disposition: A | Discharge status: Home / Self Care | Payer: MEDICARE | Source: Ambulatory Visit | Attending: Oncology | Admitting: Oncology

## 2021-01-23 ENCOUNTER — Other Ambulatory Visit: Payer: Self-pay

## 2021-01-23 ENCOUNTER — Ambulatory Visit (HOSPITAL_COMMUNITY): Admission: RE | Admit: 2021-01-23 | Payer: MEDICARE | Source: Ambulatory Visit

## 2021-01-23 DIAGNOSIS — I2510 Atherosclerotic heart disease of native coronary artery without angina pectoris: Secondary | ICD-10-CM | POA: Diagnosis not present

## 2021-01-23 DIAGNOSIS — I700 Atherosclerosis of aorta: Secondary | ICD-10-CM | POA: Diagnosis not present

## 2021-01-23 DIAGNOSIS — C7951 Secondary malignant neoplasm of bone: Secondary | ICD-10-CM | POA: Diagnosis not present

## 2021-01-23 DIAGNOSIS — J90 Pleural effusion, not elsewhere classified: Secondary | ICD-10-CM | POA: Insufficient documentation

## 2021-01-23 DIAGNOSIS — J9811 Atelectasis: Secondary | ICD-10-CM | POA: Diagnosis not present

## 2021-01-23 DIAGNOSIS — C50919 Malignant neoplasm of unspecified site of unspecified female breast: Secondary | ICD-10-CM | POA: Insufficient documentation

## 2021-01-23 MED ORDER — IOHEXOL 300 MG/ML  SOLN
75.0000 mL | Freq: Once | INTRAMUSCULAR | Status: AC | PRN
  Administered 2021-01-23: 75 mL via INTRAVENOUS

## 2021-01-24 ENCOUNTER — Inpatient Hospital Stay: Payer: MEDICARE

## 2021-01-24 ENCOUNTER — Other Ambulatory Visit: Payer: Self-pay

## 2021-01-24 ENCOUNTER — Inpatient Hospital Stay (HOSPITAL_BASED_OUTPATIENT_CLINIC_OR_DEPARTMENT_OTHER): Payer: MEDICARE | Admitting: Medical

## 2021-01-24 ENCOUNTER — Telehealth: Payer: Self-pay | Admitting: Oncology

## 2021-01-24 ENCOUNTER — Other Ambulatory Visit (HOSPITAL_COMMUNITY): Payer: Self-pay

## 2021-01-24 ENCOUNTER — Other Ambulatory Visit: Payer: Self-pay | Admitting: Oncology

## 2021-01-24 VITALS — BP 107/69 | HR 97 | Temp 96.4°F | Resp 15 | Ht 64.0 in

## 2021-01-24 DIAGNOSIS — R339 Retention of urine, unspecified: Secondary | ICD-10-CM | POA: Diagnosis not present

## 2021-01-24 DIAGNOSIS — C50919 Malignant neoplasm of unspecified site of unspecified female breast: Secondary | ICD-10-CM

## 2021-01-24 DIAGNOSIS — C779 Secondary and unspecified malignant neoplasm of lymph node, unspecified: Secondary | ICD-10-CM | POA: Diagnosis not present

## 2021-01-24 DIAGNOSIS — Z20822 Contact with and (suspected) exposure to covid-19: Secondary | ICD-10-CM | POA: Diagnosis not present

## 2021-01-24 DIAGNOSIS — C7951 Secondary malignant neoplasm of bone: Secondary | ICD-10-CM

## 2021-01-24 DIAGNOSIS — I2699 Other pulmonary embolism without acute cor pulmonale: Secondary | ICD-10-CM

## 2021-01-24 DIAGNOSIS — Z170 Estrogen receptor positive status [ER+]: Secondary | ICD-10-CM | POA: Diagnosis not present

## 2021-01-24 DIAGNOSIS — R0602 Shortness of breath: Secondary | ICD-10-CM | POA: Diagnosis not present

## 2021-01-24 DIAGNOSIS — C7802 Secondary malignant neoplasm of left lung: Secondary | ICD-10-CM | POA: Diagnosis not present

## 2021-01-24 DIAGNOSIS — Z9221 Personal history of antineoplastic chemotherapy: Secondary | ICD-10-CM | POA: Diagnosis not present

## 2021-01-24 DIAGNOSIS — C50911 Malignant neoplasm of unspecified site of right female breast: Secondary | ICD-10-CM | POA: Diagnosis not present

## 2021-01-24 DIAGNOSIS — E785 Hyperlipidemia, unspecified: Secondary | ICD-10-CM | POA: Diagnosis not present

## 2021-01-24 DIAGNOSIS — J90 Pleural effusion, not elsewhere classified: Secondary | ICD-10-CM | POA: Diagnosis not present

## 2021-01-24 DIAGNOSIS — I10 Essential (primary) hypertension: Secondary | ICD-10-CM | POA: Diagnosis not present

## 2021-01-24 DIAGNOSIS — I700 Atherosclerosis of aorta: Secondary | ICD-10-CM | POA: Diagnosis not present

## 2021-01-24 DIAGNOSIS — K760 Fatty (change of) liver, not elsewhere classified: Secondary | ICD-10-CM | POA: Diagnosis not present

## 2021-01-24 DIAGNOSIS — Z7982 Long term (current) use of aspirin: Secondary | ICD-10-CM | POA: Diagnosis not present

## 2021-01-24 DIAGNOSIS — C7800 Secondary malignant neoplasm of unspecified lung: Secondary | ICD-10-CM | POA: Diagnosis not present

## 2021-01-24 DIAGNOSIS — Z803 Family history of malignant neoplasm of breast: Secondary | ICD-10-CM | POA: Diagnosis not present

## 2021-01-24 DIAGNOSIS — Z6837 Body mass index (BMI) 37.0-37.9, adult: Secondary | ICD-10-CM | POA: Diagnosis not present

## 2021-01-24 DIAGNOSIS — Z923 Personal history of irradiation: Secondary | ICD-10-CM | POA: Diagnosis not present

## 2021-01-24 DIAGNOSIS — Z79899 Other long term (current) drug therapy: Secondary | ICD-10-CM | POA: Diagnosis not present

## 2021-01-24 DIAGNOSIS — Z01812 Encounter for preprocedural laboratory examination: Secondary | ICD-10-CM | POA: Diagnosis not present

## 2021-01-24 DIAGNOSIS — E1165 Type 2 diabetes mellitus with hyperglycemia: Secondary | ICD-10-CM | POA: Diagnosis not present

## 2021-01-24 DIAGNOSIS — M84521D Pathological fracture in neoplastic disease, right humerus, subsequent encounter for fracture with routine healing: Secondary | ICD-10-CM | POA: Diagnosis not present

## 2021-01-24 DIAGNOSIS — E1140 Type 2 diabetes mellitus with diabetic neuropathy, unspecified: Secondary | ICD-10-CM | POA: Diagnosis not present

## 2021-01-24 DIAGNOSIS — C7801 Secondary malignant neoplasm of right lung: Secondary | ICD-10-CM | POA: Diagnosis not present

## 2021-01-24 DIAGNOSIS — G893 Neoplasm related pain (acute) (chronic): Secondary | ICD-10-CM | POA: Diagnosis not present

## 2021-01-24 DIAGNOSIS — M879 Osteonecrosis, unspecified: Secondary | ICD-10-CM

## 2021-01-24 DIAGNOSIS — Z800 Family history of malignant neoplasm of digestive organs: Secondary | ICD-10-CM | POA: Diagnosis not present

## 2021-01-24 DIAGNOSIS — D630 Anemia in neoplastic disease: Secondary | ICD-10-CM | POA: Diagnosis not present

## 2021-01-24 LAB — CBC WITH DIFFERENTIAL (CANCER CENTER ONLY)
Abs Immature Granulocytes: 0.19 10*3/uL — ABNORMAL HIGH (ref 0.00–0.07)
Basophils Absolute: 0.1 10*3/uL (ref 0.0–0.1)
Basophils Relative: 1 %
Eosinophils Absolute: 0.1 10*3/uL (ref 0.0–0.5)
Eosinophils Relative: 0 %
HCT: 28.7 % — ABNORMAL LOW (ref 36.0–46.0)
Hemoglobin: 8.7 g/dL — ABNORMAL LOW (ref 12.0–15.0)
Immature Granulocytes: 2 %
Lymphocytes Relative: 3 %
Lymphs Abs: 0.4 10*3/uL — ABNORMAL LOW (ref 0.7–4.0)
MCH: 29.1 pg (ref 26.0–34.0)
MCHC: 30.3 g/dL (ref 30.0–36.0)
MCV: 96 fL (ref 80.0–100.0)
Monocytes Absolute: 1 10*3/uL (ref 0.1–1.0)
Monocytes Relative: 9 %
Neutro Abs: 10.6 10*3/uL — ABNORMAL HIGH (ref 1.7–7.7)
Neutrophils Relative %: 85 %
Platelet Count: 471 10*3/uL — ABNORMAL HIGH (ref 150–400)
RBC: 2.99 MIL/uL — ABNORMAL LOW (ref 3.87–5.11)
RDW: 17 % — ABNORMAL HIGH (ref 11.5–15.5)
WBC Count: 12.3 10*3/uL — ABNORMAL HIGH (ref 4.0–10.5)
nRBC: 0 % (ref 0.0–0.2)

## 2021-01-24 LAB — SARS CORONAVIRUS 2 (TAT 6-24 HRS): SARS Coronavirus 2: NEGATIVE

## 2021-01-24 LAB — CMP (CANCER CENTER ONLY)
ALT: 9 U/L (ref 0–44)
AST: 22 U/L (ref 15–41)
Albumin: 2.5 g/dL — ABNORMAL LOW (ref 3.5–5.0)
Alkaline Phosphatase: 144 U/L — ABNORMAL HIGH (ref 38–126)
Anion gap: 10 (ref 5–15)
BUN: 7 mg/dL — ABNORMAL LOW (ref 8–23)
CO2: 30 mmol/L (ref 22–32)
Calcium: 9.3 mg/dL (ref 8.9–10.3)
Chloride: 97 mmol/L — ABNORMAL LOW (ref 98–111)
Creatinine: 0.66 mg/dL (ref 0.44–1.00)
GFR, Estimated: >60 mL/min (ref >60)
Glucose, Bld: 124 mg/dL — ABNORMAL HIGH (ref 70–99)
Potassium: 4.6 mmol/L (ref 3.5–5.1)
Sodium: 137 mmol/L (ref 135–145)
Total Bilirubin: 0.5 mg/dL (ref 0.3–1.2)
Total Protein: 6.3 g/dL — ABNORMAL LOW (ref 6.5–8.1)

## 2021-01-24 MED ORDER — RIVAROXABAN (XARELTO) VTE STARTER PACK (15 & 20 MG): ORAL_TABLET | ORAL | 0 refills | Status: DC

## 2021-01-24 MED ORDER — RIVAROXABAN (XARELTO) VTE STARTER PACK (15 & 20 MG)
ORAL_TABLET | ORAL | 0 refills | Status: DC
  Filled 2021-01-24: qty 51, 28d supply, fill #0

## 2021-01-24 MED ORDER — ENOXAPARIN SODIUM 100 MG/ML ~~LOC~~ SOLN
100.0000 mg | Freq: Once | SUBCUTANEOUS | Status: AC
  Administered 2021-01-24: 100 mg via SUBCUTANEOUS
  Filled 2021-01-24: qty 1

## 2021-01-24 MED ORDER — ENOXAPARIN SODIUM 100 MG/ML ~~LOC~~ SOLN
100.0000 mg | Freq: Once | SUBCUTANEOUS | Status: DC
  Filled 2021-01-24: qty 1

## 2021-01-24 NOTE — Telephone Encounter (Signed)
Scheduled per 04/26 los, patient has been called and voicemail was left.

## 2021-01-24 NOTE — Progress Notes (Unsigned)
I called Mia [name] with the results of her CT scan which show a pulmonary embolus.  It also shows complete opacification of her left lung.  I called Xarelto in for her.  I think she would benefit not only from thoracentesis but from having a Pleurx placed.  I am scheduling her to see our symptom management nurse practitioner today

## 2021-01-25 ENCOUNTER — Inpatient Hospital Stay: Payer: MEDICARE | Admitting: Family Medicine

## 2021-01-25 LAB — CANCER ANTIGEN 27.29: CA 27.29: 166.3 U/mL — ABNORMAL HIGH (ref 0.0–38.6)

## 2021-01-25 NOTE — Telephone Encounter (Signed)
Oral Chemotherapy Pharmacist Encounter  I spoke with patient for overview of new oral chemotherapy medication: Piqray (alpelisib) for the treatment of metastatic, hormone receptor positive breast cancer, previously treated with endocrine based therapy, with known PIK3CA mutation, in conjunction with Faslodex, planned duration until disease progression or unacceptable toxicity.  Counseled on administration, dosing, side effects, monitoring, drug-food interactions, safe handling, storage, and disposal.  Patient will take Piqray 19m tablets, 2 tablets (300 mg) by mouth once daily with food.  Fluvestrant will be initiated with loading sequence as 5076mIM injection on days 1, 15, and 28, continued thereafter as 50066mM injections once monthly.  Fulvestrant start date: 01/26/21 Piqray start date: 01/26/21  Side effects include but not limited to: hyperglycemia, rash/hypersensitivity reaction, diarrhea, fatigue, nausea, vomiting, mouth sores, alopecia, taste changes, decreased appetite, severe lung reactions, and arthralgias.  Patient has anti-emetic on hand and knows to take it if nausea develops.   Patient will obtain anti diarrheal and alert the office of 4 or more loose stools above baseline.  Discussed with patient need for glucose control prior to Piq[place]itiation, and possible need for dose adjustment of anti-diabetic agents for the treatment of medication-induced hyperglycemia while on treatment with Piqray. Patient stated she will let her MD who manages her diabetes know that she is starting this medication.    Discussed possibility of severe cutaneous reactions with Piqray. Patient will alert the office of any dermatologic toxicity that may occur after Piqray initiation.  Reviewed with patient importance of keeping a medication schedule and plan for any missed doses. No barriers to medication adherence identified.   Medication reconciliation performed and medication/allergy list  updated.  Insurance authorization for Piq[place] been obtained. Patient's insurance requires that [place] filled through [employer]  All questions answered.  Ms. RobChipmaniced understanding and appreciation.   Medication education handout placed in mail for patient. Patient knows to call the office with questions or concerns. Oral Chemotherapy Clinic phone number provided to patient.   RebLeron CroakharmD, BCPS Hematology/Oncology Clinical Pharmacist [phone]/2022 1:40 PM

## 2021-01-26 ENCOUNTER — Other Ambulatory Visit: Payer: Self-pay

## 2021-01-26 ENCOUNTER — Inpatient Hospital Stay: Payer: MEDICARE

## 2021-01-26 ENCOUNTER — Ambulatory Visit: Payer: MEDICARE | Admitting: Oncology

## 2021-01-26 VITALS — BP 123/65 | HR 75 | Resp 18

## 2021-01-26 DIAGNOSIS — Z01812 Encounter for preprocedural laboratory examination: Secondary | ICD-10-CM | POA: Diagnosis not present

## 2021-01-26 DIAGNOSIS — C779 Secondary and unspecified malignant neoplasm of lymph node, unspecified: Secondary | ICD-10-CM | POA: Diagnosis not present

## 2021-01-26 DIAGNOSIS — Z7189 Other specified counseling: Secondary | ICD-10-CM

## 2021-01-26 DIAGNOSIS — C7951 Secondary malignant neoplasm of bone: Secondary | ICD-10-CM | POA: Diagnosis not present

## 2021-01-26 DIAGNOSIS — Z20822 Contact with and (suspected) exposure to covid-19: Secondary | ICD-10-CM | POA: Diagnosis not present

## 2021-01-26 DIAGNOSIS — C50911 Malignant neoplasm of unspecified site of right female breast: Secondary | ICD-10-CM | POA: Diagnosis not present

## 2021-01-26 DIAGNOSIS — C7800 Secondary malignant neoplasm of unspecified lung: Secondary | ICD-10-CM | POA: Diagnosis not present

## 2021-01-26 DIAGNOSIS — C50919 Malignant neoplasm of unspecified site of unspecified female breast: Secondary | ICD-10-CM

## 2021-01-26 MED ORDER — FULVESTRANT 250 MG/5ML IM SOLN
500.0000 mg | Freq: Once | INTRAMUSCULAR | Status: AC
Start: 2021-01-26 — End: 2021-01-26
  Administered 2021-01-26: 500 mg via INTRAMUSCULAR

## 2021-01-26 MED ORDER — FULVESTRANT 250 MG/5ML IM SOLN
INTRAMUSCULAR | Status: AC
  Filled 2021-01-26: qty 10

## 2021-01-26 NOTE — Patient Instructions (Signed)
Fulvestrant injection What is this medicine? FULVESTRANT (ful VES trant) blocks the effects of estrogen. It is used to treat breast cancer. This medicine may be used for other purposes; ask your health care provider or pharmacist if you have questions. COMMON BRAND NAME(S): FASLODEX What should I tell my health care provider before I take this medicine? They need to know if you have any of these conditions:  bleeding disorders  liver disease  low blood counts, like low white cell, platelet, or red cell counts  an unusual or allergic reaction to fulvestrant, other medicines, foods, dyes, or preservatives  pregnant or trying to get pregnant  breast-feeding How should I use this medicine? This medicine is for injection into a muscle. It is usually given by a health care professional in a hospital or clinic setting. Talk to your pediatrician regarding the use of this medicine in children. Special care may be needed. Overdosage: If you think you have taken too much of this medicine contact a poison control center or emergency room at once. NOTE: This medicine is only for you. Do not share this medicine with others. What if I miss a dose? It is important not to miss your dose. Call your doctor or health care professional if you are unable to keep an appointment. What may interact with this medicine?  medicines that treat or prevent blood clots like warfarin, enoxaparin, dalteparin, apixaban, dabigatran, and rivaroxaban This list may not describe all possible interactions. Give your health care provider a list of all the medicines, herbs, non-prescription drugs, or dietary supplements you use. Also tell them if you smoke, drink alcohol, or use illegal drugs. Some items may interact with your medicine. What should I watch for while using this medicine? Your condition will be monitored carefully while you are receiving this medicine. You will need important blood work done while you are taking  this medicine. Do not become pregnant while taking this medicine or for at least 1 year after stopping it. Women of child-bearing potential will need to have a negative pregnancy test before starting this medicine. Women should inform their doctor if they wish to become pregnant or think they might be pregnant. There is a potential for serious side effects to an unborn child. Men should inform their doctors if they wish to father a child. This medicine may lower sperm counts. Talk to your health care professional or pharmacist for more information. Do not breast-feed an infant while taking this medicine or for 1 year after the last dose. What side effects may I notice from receiving this medicine? Side effects that you should report to your doctor or health care professional as soon as possible:  allergic reactions like skin rash, itching or hives, swelling of the face, lips, or tongue  feeling faint or lightheaded, falls  pain, tingling, numbness, or weakness in the legs  signs and symptoms of infection like fever or chills; cough; flu-like symptoms; sore throat  vaginal bleeding Side effects that usually do not require medical attention (report to your doctor or health care professional if they continue or are bothersome):  aches, pains  constipation  diarrhea  headache  hot flashes  nausea, vomiting  pain at site where injected  stomach pain This list may not describe all possible side effects. Call your doctor for medical advice about side effects. You may report side effects to FDA at 1-800-FDA-1088. Where should I keep my medicine? This drug is given in a hospital or clinic and will  not be stored at home. NOTE: This sheet is a summary. It may not cover all possible information. If you have questions about this medicine, talk to your doctor, pharmacist, or health care provider.  2021 Elsevier/Gold Standard (2017-12-26 11:34:41)

## 2021-01-26 NOTE — Progress Notes (Signed)
[facility] Progress Note   Mia [name] [id number] 1956/03/27 65 y.o.  Mia [name] is managed by Dr. [name]  Actively treated with chemotherapy/immunotherapy/hormonal therapy: yes  Current therapy: CMF  Last treated: 12/22/2020 (cycle 3, day 1)  Next scheduled appointment with provider: 01/31/2021 (virtual visit with Dr. [name])  Assessment: Plan:    Shortness of breath - Plan: SARS Coronavirus 2 (TAT 6-24 hrs), IR Guided Drain W Catheter Placement  Acute pulmonary embolism, unspecified pulmonary embolism type, unspecified whether acute cor pulmonale present ([place]) - Plan: RIVAROXABAN (XARELTO) VTE STARTER PACK (15 & 20 MG), enoxaparin (LOVENOX) injection 100 mg, DISCONTINUED: enoxaparin (LOVENOX) injection 100 mg  Metastatic breast cancer ([place]) - Plan: IR Guided Drain W Catheter Placement  Bone metastases ([place])  Malignant neoplasm metastatic to lung, unspecified laterality ([place]) - Plan: IR Guided Drain W Catheter Placement  Pleural effusion, left - Plan: IR Guided Drain W Catheter Placement   Shortness of breath with a recurrent malignant left pleural effusion: The patient has been referred for placement of a Pleurx catheter through interventional radiology on 02/07/2021.  She will hold Xarelto 24 hours prior to her Pleurx catheter placement.  Acute pulmonary emboli: The patient was given Lovenox 100 mg subcu today and was given a prescription for Xarelto which she is to begin tomorrow.  Metastatic breast cancer with bone and lung metastasis with a history of recurrent left pleural effusion: The patient is status post cycle 3, day 1 of CMF which was dosed on 12/22/2020.  She continues on Faslodex and is to begin Piqray 300 mg daily on 01/26/2021.  She will speak to Dr. [name] next in a virtual visit on 01/31/2021.  Please see After Visit Summary for patient specific instructions.  Future Appointments  Date Time Provider [place]   01/31/2021  2:00 PM Amadi Yoshino, Virgie Dad, MD [facility] None  02/09/2021  9:00 AM [facility] LAB [facility] None  02/09/2021 10:00 AM [facility] INFUSION [facility] None  03/02/2021  9:00 AM [facility] LAB [facility] None  03/02/2021 10:00 AM [facility] INFUSION [facility] None    Orders Placed This Encounter  Procedures  . SARS Coronavirus 2 (TAT 6-24 hrs)  . IR Guided Drain W Catheter Placement       Subjective:   Patient ID:  Mia [name] is a 65 y.o. (DOB 08/16/1956) female.  Chief Complaint: No chief complaint on file.   HPI Mia [name]  is a 65 y.o. female with a diagnosis of a metastatic breast cancer with bone and lung metastasis with a history of recurrent left pleural effusion.  She is managed by Dr. [name] and is status post cycle 3, day 1 of CMF which was dosed on 12/22/2020.  She continues on Faslodex and is to begin Piqray 300 mg daily on 01/26/2021.  She presents to the clinic today with shortness of breath and a recently identified pulmonary emboli.  She is to begin Xarelto but will be given Lovenox today and will begin Xarelto tomorrow.  She continues to have shortness of breath secondary to a left pleural effusion she would have a Pleurx catheter placed next month and wishes to hold any therapeutic thoracentesis until that procedure unless her breathing worsens.   Medications: I have reviewed the patient's current medications.  Allergies:  Allergies  Allergen Reactions  . Morphine And Related Anaphylaxis    Past Medical History:  Diagnosis Date  . Breast cancer metastasized to bone ([place])   . Diabetes mellitus without complication ([place])   . History of  radiation therapy 11/18/2018-12/02/2018   lumbar spine and pelvis   Dr [name]  . History of radiation therapy 11/08/2020-11/18/2020   right arm   Dr [name]  . Hyperlipidemia   . Hypertension   . Lymphedema of right arm   . Neuropathy associated with cancer ([place])   . Obesity (BMI  35.0-39.9 without comorbidity)     Past Surgical History:  Procedure Laterality Date  . CATARACT EXTRACTION Left   . HUMERUS IM NAIL Right 01/11/2021   Procedure: INTRAMEDULLARY (IM) NAIL HUMERAL;  Surgeon: Hiram Gash, MD;  Location: [facility];  Service: Orthopedics;  Laterality: Right;  . HYSTERECTOMY ABDOMINAL WITH SALPINGO-OOPHORECTOMY    . MODIFIED RADICAL MASTECTOMY Right     Family History  Problem Relation Age of Onset  . Diabetes Mother   . Breast cancer Mother 65  . Cerebral aneurysm Mother   . Pulmonary embolism Father   . Colon cancer Father 34  . Breast cancer Sister 66       noninvasive  . Breast cancer Paternal Grandmother 19    Social History   Socioeconomic History  . Marital status: Married    Spouse name: Not on file  . Number of children: Not on file  . Years of education: Not on file  . Highest education level: Not on file  Occupational History  . Not on file  Tobacco Use  . Smoking status: Never Smoker  . Smokeless tobacco: Never Used  Vaping Use  . Vaping Use: Never used  Substance and Sexual Activity  . Alcohol use: Not Currently  . Drug use: Never  . Sexual activity: Not Currently  Other Topics Concern  . Not on file  Social History Narrative  . Not on file   Social Determinants of Health   Financial Resource Strain: Low Risk   . Difficulty of Paying Living Expenses: Not hard at all  Food Insecurity: No Food Insecurity  . Worried About Charity fundraiser in the Last Year: Never true  . Ran Out of Food in the Last Year: Never true  Transportation Needs: No Transportation Needs  . Lack of Transportation (Medical): No  . Lack of Transportation (Non-Medical): No  Physical Activity: Inactive  . Days of Exercise per Week: 0 days  . Minutes of Exercise per Session: 0 min  Stress: No Stress Concern Present  . Feeling of Stress : Not at all  Social Connections: Moderately Isolated  . Frequency of Communication with Friends and Family:  More than three times a week  . Frequency of Social Gatherings with Friends and Family: More than three times a week  . Attends Religious Services: Never  . Active Member of Clubs or Organizations: No  . Attends Archivist Meetings: Never  . Marital Status: Married  Human resources officer Violence: Not At Risk  . Fear of Current or Ex-Partner: No  . Emotionally Abused: No  . Physically Abused: No  . Sexually Abused: No    Past Medical History, Surgical history, Social history, and Family history were reviewed and updated as appropriate.   Please see review of systems for further details on the patient's review from today.   Review of Systems:  Review of Systems  Constitutional: Negative for chills, diaphoresis and fever.  HENT: Negative for trouble swallowing and voice change.   Respiratory: Positive for shortness of breath. Negative for cough, choking, chest tightness, wheezing and stridor.   Cardiovascular: Negative for chest pain and palpitations.  Gastrointestinal: Negative  for abdominal pain, constipation, diarrhea, nausea and vomiting.  Musculoskeletal: Negative for back pain and myalgias.  Neurological: Negative for dizziness, light-headedness and headaches.    Objective:   Physical Exam:  BP 107/69 (BP Location: Left Wrist, Patient Position: Sitting)   Pulse 97   Temp (!) 96.4 F (35.8 C) (Tympanic)   Resp 15   Ht 5\' 4"  (1.626 m)   LMP 10/01/2006 Comment: Total Hysterectomy  SpO2 99%   BMI 37.58 kg/m  ECOG: 1  Physical Exam Constitutional:      General: She is not in acute distress.    Appearance: She is not diaphoretic.  HENT:     Head: Normocephalic and atraumatic.  Eyes:     General: No scleral icterus.       Right eye: No discharge.        Left eye: No discharge.     Conjunctiva/sclera: Conjunctivae normal.  Cardiovascular:     Rate and Rhythm: Normal rate and regular rhythm.     Heart sounds: Normal heart sounds. No murmur heard. No friction  rub. No gallop.   Pulmonary:     Effort: Pulmonary effort is normal. No respiratory distress.     Breath sounds: Examination of the left-upper field reveals decreased breath sounds. Examination of the left-middle field reveals decreased breath sounds. Examination of the left-lower field reveals decreased breath sounds. Decreased breath sounds present. No wheezing or rales.  Skin:    General: Skin is warm and dry.     Findings: No erythema or rash.  Neurological:     Mental Status: She is alert.  Psychiatric:        Mood and Affect: Mood normal.        Behavior: Behavior normal.        Thought Content: Thought content normal.        Judgment: Judgment normal.     Lab Review:     Component Value Date/Time   NA 137 01/24/2021 1015   NA 140 01/31/2018 0000   K 4.6 01/24/2021 1015   CL 97 (L) 01/24/2021 1015   CO2 30 01/24/2021 1015   GLUCOSE 124 (H) 01/24/2021 1015   BUN 7 (L) 01/24/2021 1015   BUN 22 (A) 01/31/2018 0000   CREATININE 0.66 01/24/2021 1015   CALCIUM 9.3 01/24/2021 1015   PROT 6.3 (L) 01/24/2021 1015   ALBUMIN 2.5 (L) 01/24/2021 1015   AST 22 01/24/2021 1015   ALT 9 01/24/2021 1015   ALKPHOS 144 (H) 01/24/2021 1015   BILITOT 0.5 01/24/2021 1015   GFRNONAA >60 01/24/2021 1015   GFRAA 55 (L) 06/08/2020 1326   GFRAA >60 03/24/2019 0833       Component Value Date/Time   WBC 12.3 (H) 01/24/2021 1015   WBC 8.9 01/14/2021 0449   RBC 2.99 (L) 01/24/2021 1015   HGB 8.7 (L) 01/24/2021 1015   HCT 28.7 (L) 01/24/2021 1015   PLT 471 (H) 01/24/2021 1015   MCV 96.0 01/24/2021 1015   MCH 29.1 01/24/2021 1015   MCHC 30.3 01/24/2021 1015   RDW 17.0 (H) 01/24/2021 1015   LYMPHSABS 0.4 (L) 01/24/2021 1015   MONOABS 1.0 01/24/2021 1015   EOSABS 0.1 01/24/2021 1015   BASOSABS 0.1 01/24/2021 1015   -------------------------------  Imaging from last 24 hours (if applicable):  Radiology interpretation: DG Chest 1 View  Result Date: 01/13/2021 CLINICAL DATA:   65 year old female with history of left pleural effusion, metastatic breast cancer. Status post left thoracentesis. EXAM: CHEST  1 VIEW COMPARISON:  01/10/2021 FINDINGS: The left cardiomediastinal silhouette is obscured, similar to comparison. Similar appearing large left pleural effusion and associated left lung atelectasis, likely mostly passive secondary to effusion. The right lung is clear. No evidence of pneumothorax. Surgical clips in the right axilla, unchanged. Mild cortical irregularity about the left scapula in keeping with known osseous metastasis. The known proximal right humerus fracture is excluded from this study. The remaining known osseous metastases are not well visualized on this study. IMPRESSION: 1. No evidence of pneumothorax after left thoracentesis. 2. Persistent large left pleural effusion and left lung atelectatic changes. Electronically Signed   By: Ruthann Cancer MD   On: 01/13/2021 12:17   DG Chest 1 View  Result Date: 01/06/2021 CLINICAL DATA:  Post LEFT thoracentesis removal of 1.2 L by report. EXAM: CHEST  1 VIEW COMPARISON:  CT chest from January 2022 and chest x-ray from January 05, 2021. FINDINGS: Cardiomediastinal contours largely obscured by persistent opacification of LEFT hemithorax. Trachea midline. Crescentic graded opacity along the LEFT hemithorax suggest residual pleural fluid despite removal of 1.2 L. RIGHT lung is clear. No sign of pneumothorax on the LEFT following thoracentesis. Suspected pathologic fracture of the RIGHT proximal humerus is not visible on the current study. Changes of RIGHT axillary dissection. EKG leads project over the chest. Calcification along the tract of previous catheter projects over the LEFT neck. IMPRESSION: 1. No sign of pneumothorax on the LEFT following LEFT thoracentesis with improved aeration but with persistent opacification of LEFT hemithorax with residual airspace disease and moderately large pleural effusion. 2. Suspected pathologic  fracture RIGHT proximal humerus not visible on the current study. Electronically Signed   By: Zetta Bills M.D.   On: 01/06/2021 13:38   DG Chest 2 View  Result Date: 01/05/2021 CLINICAL DATA:  Worsening shortness of breath with history of right-sided metastatic breast cancer and lung cancer. EXAM: CHEST - 2 VIEW COMPARISON:  November 21, 2020 FINDINGS: There is complete opacification of the left hemithorax. This is increased in severity when compared to the prior study. The right lung is clear. The heart size and mediastinal contours are within normal limits. Marked severity calcification of the aortic arch is noted. Radiopaque surgical clips are seen overlying the lateral aspect of the right hemithorax. A linear cortical defect is seen overlying the proximal shaft of the right humerus. This represents a new finding when compared to the prior study. IMPRESSION: 1. Complete opacification of the left hemithorax, likely secondary to an extensive amount of atelectasis, infiltrate and associated pleural effusion. Correlation with chest CT is recommended. 2. Additional findings suggestive of a nondisplaced fracture of the proximal right humerus. Correlation with physical examination and patient history is recommended. Electronically Signed   By: Virgina Norfolk M.D.   On: 01/05/2021 17:41   CT Chest W Contrast  Addendum Date: 01/24/2021   ADDENDUM REPORT: 01/24/2021 09:16 ADDENDUM: Critical Value/emergent results were called by telephone at the time of interpretation on 01/24/2021 at 9:16 am to provider [facility] , who verbally acknowledged these results. Electronically Signed   By: Kerby Moors M.D.   On: 01/24/2021 09:16   Result Date: 01/24/2021 CLINICAL DATA:  Breast cancer.  Restaging. EXAM: CT CHEST WITH CONTRAST TECHNIQUE: Multidetector CT imaging of the chest was performed during intravenous contrast administration. CONTRAST:  70mL OMNIPAQUE IOHEXOL 300 MG/ML  SOLN COMPARISON:  10/18/2020  FINDINGS: Cardiovascular: The heart size appears within normal limits. Aortic atherosclerosis. Coronary artery calcifications. No signs of pericardial effusion.  Within the main pulmonary artery there is an age indeterminate clot fill measuring 9 mm, image 58/2, which is new from previous exam. Mediastinum/Nodes: Normal appearance of the thyroid gland. The trachea appears patent and is midline. Normal appearance of the esophagus. No axillary, supraclavicular, mediastinal, or hilar lymph nodes. Lungs/Pleura: Large left pleural effusion is identified. Compared with 10/18/2020 this exhibits significant interval increase in volume with near complete atelectasis of the left lung. Previously noted 3 mm left lower lobe lung nodule has resolved in the interval. No new right lung nodules. Previously noted left lung nodules cannot be reassessed due to near complete atelectasis of the left lung. Upper Abdomen: No acute abnormality within the imaged portions of the upper abdomen. Musculoskeletal: Similar appearance of multifocal mixed lytic and sclerotic bone metastases, including: -Lytic lesion involving the left pedicle of T5, image 82/6. -large permeative lesion with pathologic fracture involving the left scapula, image 25/2. -expansile lytic lesion involving the posterolateral aspect of the left seventh rib, image 55/2. IMPRESSION: 1. Significant interval increase in volume of left pleural effusion with near complete atelectasis of the left lung. 2. No significant interval change in diffuse mixed lytic and sclerotic bone metastases. 3. New filling defect is identified left main pulmonary artery consistent with age indeterminate pulmonary embolus. No additional pulmonary emboli identified. 4. Coronary artery calcifications noted. 5. Aortic atherosclerosis. Aortic Atherosclerosis (ICD10-I70.0). Electronically Signed: By: Kerby Moors M.D. On: 01/24/2021 08:56   MR HUMERUS RIGHT W WO CONTRAST  Result Date:  01/07/2021 CLINICAL DATA:  Pathologic proximal humerus fracture. History of metastatic breast cancer. EXAM: MRI OF THE RIGHT SHOULDER WITHOUT AND WITH CONTRAST; MRI OF THE RIGHT HUMERUS WITHOUT AND WITH CONTRAST TECHNIQUE: Multiplanar, multisequence MR imaging of the right shoulder and humerus was performed before and after the administration of intravenous contrast. CONTRAST:  86mL GADAVIST GADOBUTROL 1 MMOL/ML IV SOLN COMPARISON:  Right humerus x-rays from yesterday. Right shoulder x-rays dated October 24, 2020. FINDINGS: Rotator cuff:  Intact rotator cuff.  Moderate tendinosis. Muscles: Diffuse muscle edema about the right shoulder, presumably related to prior radiation. No significant atrophy. Biceps long head:  Intact and normally positioned. Acromioclavicular Joint: Moderate arthropathy of the acromioclavicular joint. Type I acromion. No subacromial/subdeltoid bursal fluid. Glenohumeral Joint: Small joint effusion.  No chondral defect. Labrum:  Intact. Bones: Large marrow replacing lesion involving the majority of the humeral head and proximal metadiaphysis, spanning a length of approximately 8.5 cm, best appreciated on the sagittal T1 images. There is a subacute mildly displaced pathologic fracture of the proximal metadiaphysis with 5 mm anterior displacement. There is a small amount of subperiosteal fluid. No additional metastatic lesions involving the right upper arm. Incompletely evaluated metastases involving multiple thoracic vertebral bodies. Other: Scattered soft tissue swelling of the right upper arm. Partially visualized left pleural effusion. IMPRESSION: 1. Large osseous metastasis involving the majority of the humeral head and proximal metadiaphysis, spanning a length of approximately 8.5 cm. Associated subacute mildly displaced pathologic fracture of the proximal metadiaphysis. 2. No additional metastatic lesions involving the right upper arm. 3. Incompletely evaluated thoracic vertebral metastatic  disease. 4. Intact rotator cuff with moderate tendinosis. Electronically Signed   By: Titus Dubin M.D.   On: 01/07/2021 13:18   MR SHOULDER RIGHT W WO CONTRAST  Result Date: 01/07/2021 CLINICAL DATA:  Pathologic proximal humerus fracture. History of metastatic breast cancer. EXAM: MRI OF THE RIGHT SHOULDER WITHOUT AND WITH CONTRAST; MRI OF THE RIGHT HUMERUS WITHOUT AND WITH CONTRAST TECHNIQUE: Multiplanar, multisequence MR imaging  of the right shoulder and humerus was performed before and after the administration of intravenous contrast. CONTRAST:  10mL GADAVIST GADOBUTROL 1 MMOL/ML IV SOLN COMPARISON:  Right humerus x-rays from yesterday. Right shoulder x-rays dated October 24, 2020. FINDINGS: Rotator cuff:  Intact rotator cuff.  Moderate tendinosis. Muscles: Diffuse muscle edema about the right shoulder, presumably related to prior radiation. No significant atrophy. Biceps long head:  Intact and normally positioned. Acromioclavicular Joint: Moderate arthropathy of the acromioclavicular joint. Type I acromion. No subacromial/subdeltoid bursal fluid. Glenohumeral Joint: Small joint effusion.  No chondral defect. Labrum:  Intact. Bones: Large marrow replacing lesion involving the majority of the humeral head and proximal metadiaphysis, spanning a length of approximately 8.5 cm, best appreciated on the sagittal T1 images. There is a subacute mildly displaced pathologic fracture of the proximal metadiaphysis with 5 mm anterior displacement. There is a small amount of subperiosteal fluid. No additional metastatic lesions involving the right upper arm. Incompletely evaluated metastases involving multiple thoracic vertebral bodies. Other: Scattered soft tissue swelling of the right upper arm. Partially visualized left pleural effusion. IMPRESSION: 1. Large osseous metastasis involving the majority of the humeral head and proximal metadiaphysis, spanning a length of approximately 8.5 cm. Associated subacute mildly  displaced pathologic fracture of the proximal metadiaphysis. 2. No additional metastatic lesions involving the right upper arm. 3. Incompletely evaluated thoracic vertebral metastatic disease. 4. Intact rotator cuff with moderate tendinosis. Electronically Signed   By: Titus Dubin M.D.   On: 01/07/2021 13:18   DG CHEST PORT 1 VIEW  Result Date: 01/14/2021 CLINICAL DATA:  Pleural effusion EXAM: PORTABLE CHEST 1 VIEW COMPARISON:  01/13/2021 FINDINGS: There is now complete opacification of the left hemithorax likely reflecting a combination of increasing pleural fluid and atelectatic change. The right lung remains clear. No right effusion. No visible pneumothorax. Cardiomediastinal contours are largely obscured by overlying opacity. Surgical clips in the right axilla. Postsurgical changes from prior 8 per right humeral intramedullary nail placement. Mild appearance of the left scapula compatible known osseous metastasis. Additional irregular osseous metastatic disease seen in multiple ribs as well. IMPRESSION: Complete opacification of the left hemithorax likely reflecting a combination of increasing pleural fluid and atelectatic change. Electronically Signed   By: Lovena Le M.D.   On: 01/14/2021 05:10   DG CHEST PORT 1 VIEW  Result Date: 01/10/2021 CLINICAL DATA:  Pleural effusion with cough.  Breast carcinoma EXAM: PORTABLE CHEST 1 VIEW COMPARISON:  January 06, 2021 chest radiograph and right humerus radiographs FINDINGS: Most of the left hemithorax remains opacified, likely due to combination of pleural effusion and atelectasis/consolidation throughout most of the left lung. Mild aeration remains in a portion of the left upper lobe. The right lung is clear. Heart is prominent, stable, with normal appearing pulmonary vascularity on the right. Pulmonary vascularity on the left is obscured by apparent effusion and infiltrate. Surgical clips noted in the right axilla. Ill-defined mixed sclerotic and lytic  changes noted in the left glenoid region. Apparent pathologic fracture proximal right humerus again noted. IMPRESSION: Opacification of most of the left hemithorax, likely due to combination of sizable pleural effusion and areas of atelectasis/consolidation. Right lung clear. Grossly stable cardiac silhouette. Bony metastases noted. Apparent pathologic fracture proximal right humerus, documented previously and not significantly changed. Surgical clips in right axilla. Electronically Signed   By: Lowella Grip III M.D.   On: 01/10/2021 08:41   DG Humerus Right  Result Date: 01/11/2021 CLINICAL DATA:  Right humerus fracture. EXAM: RIGHT HUMERUS -  2+ VIEW COMPARISON:  01/06/2021 FINDINGS: Again noted is a fracture involving the proximal humerus. Intramedullary rod has been placed with 3 proximal interlocking screws and 2 distal interlocking screws. Improved alignment of the humerus with near anatomic alignment. Right shoulder appears located on these views. Extensive degenerative changes at the right [facility] joint. IMPRESSION: Internal fixation of the proximal right humerus fracture. Improved alignment of the humerus. Electronically Signed   By: Markus Daft M.D.   On: 01/11/2021 16:43   DG Humerus Right  Result Date: 01/11/2021 CLINICAL DATA:  Proximal humerus fracture. EXAM: RIGHT HUMERUS - 2+ VIEW COMPARISON:  01/06/2021. FINDINGS: Intraoperative images demonstrate an intramedullary rod in the humerus with 3 proximal interlocking screws. Two distal interlocking screws. IMPRESSION: Internal fixation of the right humerus fracture. Electronically Signed   By: Markus Daft M.D.   On: 01/11/2021 16:37   DG Humerus Right  Result Date: 01/06/2021 CLINICAL DATA:  Possible fracture on chest radiograph. Pain for several days. No known injury. EXAM: RIGHT HUMERUS - 2+ VIEW COMPARISON:  10/24/2020 right shoulder radiographs. FINDINGS: Transverse fracture of the proximal right humeral neck with mild lateral angulation of  the distal fracture fragment. The fracture appears to be old with callus formation present although union is incomplete. Mild poorly defined decreased attenuation in the bone at the level of the fracture may indicate a pathologic fracture. The lesion was demonstrated in the proximal right humerus on the previous shoulder study. No expansile changes. Soft tissues are unremarkable. IMPRESSION: Transverse pathologic fracture of the proximal right humerus with mild angulation. Callus formation is present without complete union. Electronically Signed   By: Lucienne Capers M.D.   On: 01/06/2021 00:28   DG C-Arm 1-60 Min-No Report  Result Date: 01/11/2021 Fluoroscopy was utilized by the requesting physician.  No radiographic interpretation.   US THORACENTESIS ASP PLEURAL SPACE W/IMG GUIDE  Result Date: 01/13/2021 INDICATION: Patient with history of metastatic breast carcinoma, dyspnea, recurrent malignant left pleural effusion. Request received for diagnostic and therapeutic left thoracentesis. EXAM: ULTRASOUND GUIDED DIAGNOSTIC AND THERAPEUTIC LEFT THORACENTESIS MEDICATIONS: 1% lidocaine to skin and subcutaneous tissue COMPLICATIONS: None immediate. PROCEDURE: An ultrasound guided thoracentesis was thoroughly discussed with the patient and questions answered. The benefits, risks, alternatives and complications were also discussed. The patient understands and wishes to proceed with the procedure. Written consent was obtained. Ultrasound was performed to localize and mark an adequate pocket of fluid in the left chest. The area was then prepped and draped in the normal sterile fashion. 1% Lidocaine was used for local anesthesia. Under ultrasound guidance a 6 Fr Safe-T-Centesis catheter was introduced. Thoracentesis was performed. The catheter was removed and a dressing applied. FINDINGS: A total of approximately 570 cc of dark, bloody fluid was removed. Samples were sent to the laboratory as requested by the  clinical team. Due to patient chest discomfort only the above amount of fluid was removed today. IMPRESSION: Successful ultrasound guided diagnostic and therapeutic left thoracentesis yielding 570 cc of pleural fluid. Read by: Rowe Robert, PA-C Electronically Signed   By: Ruthann Cancer MD   On: 01/13/2021 11:50   US THORACENTESIS ASP PLEURAL SPACE W/IMG GUIDE  Result Date: 01/06/2021 INDICATION: Patient with history of metastatic breast carcinoma, dyspnea, recurrent malignant left pleural effusion; request received for diagnostic and therapeutic left thoracentesis. EXAM: ULTRASOUND GUIDED DIAGNOSTIC AND THERAPEUTIC LEFT THORACENTESIS MEDICATIONS: 1% lidocaine to skin and subcutaneous tissue COMPLICATIONS: None immediate. PROCEDURE: An ultrasound guided thoracentesis was thoroughly discussed with the patient and questions answered.  The benefits, risks, alternatives and complications were also discussed. The patient understands and wishes to proceed with the procedure. Written consent was obtained. Ultrasound was performed to localize and mark an adequate pocket of fluid in the left chest. The area was then prepped and draped in the normal sterile fashion. 1% Lidocaine was used for local anesthesia. Under ultrasound guidance a 6 Fr Safe-T-Centesis catheter was introduced. Thoracentesis was performed. The catheter was removed and a dressing applied. FINDINGS: A total of approximately 1.2 liters of blood-tinged fluid was removed. Samples were sent to the laboratory as requested by the clinical team. Due to patient chest discomfort only the above amount of fluid was removed today. IMPRESSION: Successful ultrasound guided diagnostic and therapeutic left thoracentesis yielding 1.2 liters of pleural fluid. Read by: Rowe Robert, PA-C Electronically Signed   By: Ruthann Cancer MD   On: 01/06/2021 13:14        This patient was seen with Dr. [name] with my treatment plan reviewed with him. He expressed agreement with  my medical management of this patient.

## 2021-01-31 ENCOUNTER — Encounter: Payer: Self-pay | Admitting: Oncology

## 2021-01-31 ENCOUNTER — Telehealth (HOSPITAL_BASED_OUTPATIENT_CLINIC_OR_DEPARTMENT_OTHER): Payer: MEDICARE | Admitting: Oncology

## 2021-01-31 ENCOUNTER — Other Ambulatory Visit: Payer: Self-pay

## 2021-01-31 DIAGNOSIS — C7800 Secondary malignant neoplasm of unspecified lung: Secondary | ICD-10-CM

## 2021-01-31 DIAGNOSIS — C7951 Secondary malignant neoplasm of bone: Secondary | ICD-10-CM

## 2021-01-31 DIAGNOSIS — C50919 Malignant neoplasm of unspecified site of unspecified female breast: Secondary | ICD-10-CM

## 2021-01-31 MED ORDER — BLOOD GLUCOSE MONITOR KIT: PACK | 0 refills | Status: AC

## 2021-01-31 NOTE — Progress Notes (Signed)
[place]  [phone] [phone]) [phone]    ID: Mia [name] DOB: 1955-10-16  MR#: [id number]  PJS#:[id number]  Patient Care Team: Ronnald Nian, DO as PCP - General (Family Medicine) Wesson Stith, Virgie Dad, MD as Consulting Physician (Oncology) [name], [place]  I connected with Mia [name] on 01/31/21 at  2:00 PM EDT by video enabled telemedicine visit and verified that I am speaking with the correct person using two identifiers.   I discussed the limitations, risks, security and privacy concerns of performing an evaluation and management service by telemedicine and the availability of in-person appointments. I also discussed with the patient that there may be a patient responsible charge related to this service. The patient expressed understanding and agreed to proceed.   Other persons participating in the visit and their role in the encounter: Patient's husband  Patient's location: Home Provider's location: [place]  Total time spent: 25 min   CHIEF COMPLAINT: Estrogen receptor positive stage IV breast cancer (s/p right mastectomy)  CURRENT TREATMENT: Alpelisib, Faslodex, rivaroxaban   INTERVAL HISTORY: Mia [name] was contacted today for follow-up and treatment of her estrogen receptor positive stage IV breast cancer.   She had a CT scan 01/23/2021 of the chest which showed significant increase in the left effusion, but also a new filling defect in the left main pulmonary artery.  This was consistent with an age-indeterminate pulmonary embolus.  She was started on rivaroxaban the same day.  She was prescribed alpelisib and started this 01/27/2021.  She is not at present checking her blood sugars and does not have the equipment to do that she says.   REVIEW OF SYSTEMS: Mia [name] is taking little walks in the house.  She is wearing oxygen by nasal cannula 24/7 including when she goes out (she has a  portable tank).  She denies unusual headaches visual changes nausea or vomiting.  She tells me she is pain-free on the current pain management drugs.  She is able to have good bowel movements.  She denies cough or pleurisy.  A detailed review of systems was otherwise stable.   COVID 19 VACCINATION STATUS: [place] x2, with the booster 08/16/2020   HISTORY OF CURRENT ILLNESS: From the original intake note:  We have reviewed the medical records from [place], which is the source of the information below:  Mia [name] was initially diagnosed with Stage IIIA (T2N2M0) invasive ductal carcinoma, estrogen receptor positive and HER2 negative right breast cancer in 2000. She underwent right mastectomy, with 4 positive metastatic lymph nodes. She had chemotherapy with taxol and cytoxan and fulvestrant in the past. She had radiation and completed 5 years of tamoxifen.   She was diagnosed with Stage IV disease 04/18/2005 with metastases to the bone, lung, and additional nodes. She was on capecitabine but was discontinued after rising CA 27-29 and scans.   She started anastrozole and everolimus with Delton See in April 2014. She tolerated this well, but this was discontinued in July 2014 due to abnormal blood tests, hyperlipidemia and hyperglycemia.  She began Abraxane 120m /m2 and Xgeva in August 2014 weekly x3 with 1 week off. She tolerated this treatment well. She discontinued Xgeva January 2016 due to osteonecrosis of the jaw and mouth issues. She continued abraxane alone until her last dose on 11/18/2015 due to neuropathy and disease progression with restaging studies on 11/23/2015 with a CT chest abdomen and pelvis and one scan showing skull, sternal and femoral lesions.  She was switched to gemcitabine starting 01/20/2016 weekly x3 and 1 week off. This was discontinued on 06/15/2016 due to a port related jugular clot on the right side. She was given Lovenox for 3 months.  She was switched to  Letrozole 2.5 mg and palbociclib 125 mg starting 07/13/2016. She tolerated this well. Restaging studies with CT chest abdomen and pelvis and bone scan on 07/01/2017 showed stable/ improving lesions. CA 27-29 was stable.  During the last few months of follow up in [place], she completed restaging studies with a CT chest abdomen and pelvis at [facility] on 12/17/2017 showing: A 6 mm pulmonary nodule in the left upper lobe laterally. Progression of metastatic disease to the bones at the L4 and L5 vertebral bodies. Sclerotic metastases at T8, T10, an T11, are similar to previous exams. Sclerotic metastases in the sternum stable. Hepatic steatosis. Aortic atherosclerosis.  Most recent CA-16-29 (January 2019) was 58.  Most recent hemoglobin A1c was 7.1 according to the patient  The patient's subsequent history is as detailed below.   PAST MEDICAL HISTORY: Past Medical History:  Diagnosis Date  . Breast cancer metastasized to bone ([place])   . Diabetes mellitus without complication ([place])   . History of radiation therapy 11/18/2018-12/02/2018   lumbar spine and pelvis   Dr [name]  . History of radiation therapy 11/08/2020-11/18/2020   right arm   Dr [name]  . Hyperlipidemia   . Hypertension   . Lymphedema of right arm   . Neuropathy associated with cancer ([place])   . Obesity (BMI 35.0-39.9 without comorbidity)      PAST SURGICAL HISTORY: Past Surgical History:  Procedure Laterality Date  . CATARACT EXTRACTION Left   . HUMERUS IM NAIL Right 01/11/2021   Procedure: INTRAMEDULLARY (IM) NAIL HUMERAL;  Surgeon: Hiram Gash, MD;  Location: [facility];  Service: Orthopedics;  Laterality: Right;  . HYSTERECTOMY ABDOMINAL WITH SALPINGO-OOPHORECTOMY    . MODIFIED RADICAL MASTECTOMY Right      FAMILY HISTORY: Family History  Problem Relation Age of Onset  . Diabetes Mother   . Breast cancer Mother 72  . Cerebral aneurysm Mother   . Pulmonary embolism Father   . Colon cancer Father 76  . Breast  cancer Sister 53       noninvasive  . Breast cancer Paternal Grandmother 78   The patient reports she had negative genetic testing at [facility] 2014, report not available. --The patient's father died at age 79 due to a PE, and he also had a history of colon cancer diagnosed at age 66. The patient's mother died at age 35 due to diabetes and survived a cerebral aneurysm. The patient's mother also had a history of breast cancer diagnosed at age 23.  The patient has no brothers and 1 sister. The patient's sister was diagnosed with non invasive breast cancer at age 76. There was a paternal grandmother diagnosed with breast cancer at age 77. The patient denies a family history of ovarian cancer.    GYNECOLOGIC HISTORY:  Patient's last menstrual period was 10/01/2006. Menarche: 65 years old Age at first live birth: 65 years old She is [place] P2.  She is status post hysterectomy with bilateral salpingo- oophorectomy 12/23/2006 with benign pathology ([id number]) She never used HRT.    SOCIAL HISTORY:  Mia [name] is disabled due to her breast cancer. She used to be Surveyor, quantity for a cardiology office. Her husband, [name] is in the process of retiring. He is a Electrical engineer  for a power company. The patient's daughter, [name], lives in [place] and works as a Teaching laboratory technician. The patient's second daughter, [name] is a stay at home mom and is a special needs teacher, who will soon move to [facility] [place]). The patient plans on attending [employer].     ADVANCED DIRECTIVES: In the absence of any documentation to the contrary, the patient's spouse is their HCPOA.    HEALTH MAINTENANCE: Social History   Tobacco Use  . Smoking status: Never Smoker  . Smokeless tobacco: Never Used  Vaping Use  . Vaping Use: Never used  Substance Use Topics  . Alcohol use: Not Currently  . Drug use: Never    Colonoscopy: 2017   PAP:  Bone density: never  Mammogram:  2017   Allergies  Allergen Reactions  . Morphine And Related Anaphylaxis    Current Outpatient Medications  Medication Sig Dispense Refill  . alpelisib (PIQRAY 300MG DAILY DOSE) 2 x 150 MG Therapy Pack Take 300 mg by mouth daily. Take with food on days 1-28 at the same time daily. Swallow whole, do not crush, chew, or split. 56 each 6  . amLODipine (NORVASC) 10 MG tablet Take 10 mg by mouth daily.    Marland Kitchen aspirin 81 MG chewable tablet Chew 81 mg by mouth daily.    Marland Kitchen COVID-19 mRNA vaccine, Pfizer, 30 MCG/0.3ML injection AS DIRECTED .3 mL 0  . fenofibrate (TRICOR) 145 MG tablet TAKE 1 TABLET BY MOUTH EVERY DAY (Patient taking differently: Take 145 mg by mouth daily.) 90 tablet 2  . fluticasone (FLONASE) 50 MCG/ACT nasal spray SPRAY 2 SPRAYS INTO EACH NOSTRIL EVERY DAY (Patient taking differently: Place 1 spray into both nostrils daily.) 48 mL 1  . gabapentin (NEURONTIN) 300 MG capsule Take 1 capsule (300 mg total) by mouth 4 (four) times daily. Pt is only taking 2 capsules a day (Patient taking differently: Take 300 mg by mouth 2 (two) times daily.) 180 capsule 0  . ibuprofen (ADVIL) 800 MG tablet Take 800 mg by mouth every 8 (eight) hours as needed for moderate pain. 30 tablet 0  . Insulin Admin Supplies MISC Inject 24 Units into the skin daily.    . insulin glargine (LANTUS SOLOSTAR) 100 UNIT/ML Solostar Pen INJECT 24 UNITS INTO THE SKIN DAILY (Patient taking differently: Inject 24 Units into the skin daily.) 15 mL 1  . Insulin Pen Needle 31G X 5 MM MISC UAD for Sq inj for DM qd 100 each PRN  . losartan (COZAAR) 50 MG tablet Take 1 tablet (50 mg total) by mouth 2 (two) times daily. 180 tablet 3  . metoprolol succinate (TOPROL-XL) 25 MG 24 hr tablet TAKE 1 TABLET BY MOUTH EVERY DAY (Patient taking differently: Take 25 mg by mouth daily.) 90 tablet 1  . oxyCODONE (OXY IR/ROXICODONE) 5 MG immediate release tablet Take 1-2 tablets (5-10 mg total) by mouth every 4 (four) hours as needed for severe  pain. 60 tablet 0  . oxyCODONE (OXYCONTIN) 20 mg 12 hr tablet Take 1 tablet (20 mg total) by mouth every 8 (eight) hours. 90 tablet 0  . pantoprazole (PROTONIX) 40 MG tablet TAKE 1 TABLET BY MOUTH EVERY DAY (Patient taking differently: Take 40 mg by mouth daily.) 90 tablet 0  . pioglitazone (ACTOS) 15 MG tablet TAKE 1 TABLET BY MOUTH EVERY DAY (Patient taking differently: Take 15 mg by mouth daily.) 90 tablet 1  . pravastatin (PRAVACHOL) 20 MG tablet TAKE 1 TABLET BY MOUTH  EVERY DAY (Patient taking differently: Take 20 mg by mouth daily.) 90 tablet 2  . RIVAROXABAN (XARELTO) VTE STARTER PACK (15 & 20 MG) Follow package directions: Take one 82m tablet by mouth twice a day. On day 22, switch to one 240mtablet once a day. Take with food. 51 each 0  . sitaGLIPtin-metformin (JANUMET) 50-1000 MG tablet Take 1 tablet by mouth daily with breakfast. 90 tablet 1  . Wheat Dextrin-Calcium (BENEFIBER PLUS CALCIUM) CHEW Chew 2 tablets by mouth daily.     No current facility-administered medications for this visit.    OBJECTIVE: White woman using a cane There were no vitals filed for this visit. Wt Readings from Last 3 Encounters:  01/11/21 218 lb 14.7 oz (99.3 kg)  01/05/21 219 lb (99.3 kg)  12/01/20 215 lb 6.4 oz (97.7 kg)   There is no height or weight on file to calculate BMI.   ECOG FS:2 - Symptomatic, <50% confined to bed  Telemedicine visit 01/31/2021  LAB RESULTS:  CMP   No results found for: TOTALPROTELP, ALBUMINELP, A1GS, A2GS, BETS, BETA2SER, GAMS, MSPIKE, SPEI  No results found for: KPAFRELGTCHN, LAMBDASER, KAPLAMBRATIO  Lab Results  Component Value Date   WBC 12.3 (H) 01/24/2021   NEUTROABS 10.6 (H) 01/24/2021   HGB 8.7 (L) 01/24/2021   HCT 28.7 (L) 01/24/2021   MCV 96.0 01/24/2021   PLT 471 (H) 01/24/2021    No results found for: LABCA2  No components found for: LAZOXWRU045No results for input(s): INR in the last 168 hours.  No results found for: LABCA2  No results  found for: CAWUJ811No results found for: CABJY782No results found for: CANFA213Lab Results  Component Value Date   CA2729 166.3 (H) 01/24/2021    No components found for: HGQUANT  No results found for: CEA1 / No results found for: CEA1   No results found for: AFPTUMOR  No results found for: CHROMOGRNA  No results found for: HGBA, HGBA2QUANT, HGBFQUANT, HGBSQUAN (Hemoglobinopathy evaluation)   No results found for: LDH  No results found for: IRON, TIBC, IRONPCTSAT (Iron and TIBC)  No results found for: FERRITIN  Urinalysis    Component Value Date/Time   COLORURINE YELLOW 01/05/2021 2200   APPEARANCEUR CLEAR 01/05/2021 2200   LABSPEC 1.009 01/05/2021 2200   PHURINE 5.0 01/05/2021 2200   GLUCOSEU NEGATIVE 01/05/2021 2200   HGBUR NEGATIVE 01/05/2021 2200   BILIRUBINUR NEGATIVE 01/05/2021 2200   KETONESUR NEGATIVE 01/05/2021 2200   PROTEINUR NEGATIVE 01/05/2021 2200   NITRITE NEGATIVE 01/05/2021 2200   LEUKOCYTESUR NEGATIVE 01/05/2021 2200    STUDIES: DG Chest 1 View  Result Date: 01/13/2021 CLINICAL DATA:  [age]ear old female with history of left pleural effusion, metastatic breast cancer. Status post left thoracentesis. EXAM: CHEST  1 VIEW COMPARISON:  01/10/2021 FINDINGS: The left cardiomediastinal silhouette is obscured, similar to comparison. Similar appearing large left pleural effusion and associated left lung atelectasis, likely mostly passive secondary to effusion. The right lung is clear. No evidence of pneumothorax. Surgical clips in the right axilla, unchanged. Mild cortical irregularity about the left scapula in keeping with known osseous metastasis. The known proximal right humerus fracture is excluded from this study. The remaining known osseous metastases are not well visualized on this study. IMPRESSION: 1. No evidence of pneumothorax after left thoracentesis. 2. Persistent large left pleural effusion and left lung atelectatic changes. Electronically Signed    By: DyRuthann CancerD   On: 01/13/2021 12:17   DG Chest  1 View  Result Date: 01/06/2021 CLINICAL DATA:  Post LEFT thoracentesis removal of 1.2 L by report. EXAM: CHEST  1 VIEW COMPARISON:  CT chest from January 2022 and chest x-ray from January 05, 2021. FINDINGS: Cardiomediastinal contours largely obscured by persistent opacification of LEFT hemithorax. Trachea midline. Crescentic graded opacity along the LEFT hemithorax suggest residual pleural fluid despite removal of 1.2 L. RIGHT lung is clear. No sign of pneumothorax on the LEFT following thoracentesis. Suspected pathologic fracture of the RIGHT proximal humerus is not visible on the current study. Changes of RIGHT axillary dissection. EKG leads project over the chest. Calcification along the tract of previous catheter projects over the LEFT neck. IMPRESSION: 1. No sign of pneumothorax on the LEFT following LEFT thoracentesis with improved aeration but with persistent opacification of LEFT hemithorax with residual airspace disease and moderately large pleural effusion. 2. Suspected pathologic fracture RIGHT proximal humerus not visible on the current study. Electronically Signed   By: Zetta Bills M.D.   On: 01/06/2021 13:38   DG Chest 2 View  Result Date: 01/05/2021 CLINICAL DATA:  Worsening shortness of breath with history of right-sided metastatic breast cancer and lung cancer. EXAM: CHEST - 2 VIEW COMPARISON:  November 21, 2020 FINDINGS: There is complete opacification of the left hemithorax. This is increased in severity when compared to the prior study. The right lung is clear. The heart size and mediastinal contours are within normal limits. Marked severity calcification of the aortic arch is noted. Radiopaque surgical clips are seen overlying the lateral aspect of the right hemithorax. A linear cortical defect is seen overlying the proximal shaft of the right humerus. This represents a new finding when compared to the prior study. IMPRESSION: 1.  Complete opacification of the left hemithorax, likely secondary to an extensive amount of atelectasis, infiltrate and associated pleural effusion. Correlation with chest CT is recommended. 2. Additional findings suggestive of a nondisplaced fracture of the proximal right humerus. Correlation with physical examination and patient history is recommended. Electronically Signed   By: Virgina Norfolk M.D.   On: 01/05/2021 17:41   CT Chest W Contrast  Addendum Date: 01/24/2021   ADDENDUM REPORT: 01/24/2021 09:16 ADDENDUM: Critical Value/emergent results were called by telephone at the time of interpretation on 01/24/2021 at 9:16 am to provider [facility] , who verbally acknowledged these results. Electronically Signed   By: Kerby Moors M.D.   On: 01/24/2021 09:16   Result Date: 01/24/2021 CLINICAL DATA:  Breast cancer.  Restaging. EXAM: CT CHEST WITH CONTRAST TECHNIQUE: Multidetector CT imaging of the chest was performed during intravenous contrast administration. CONTRAST:  109m OMNIPAQUE IOHEXOL 300 MG/ML  SOLN COMPARISON:  10/18/2020 FINDINGS: Cardiovascular: The heart size appears within normal limits. Aortic atherosclerosis. Coronary artery calcifications. No signs of pericardial effusion. Within the main pulmonary artery there is an age indeterminate clot fill measuring 9 mm, image 58/2, which is new from previous exam. Mediastinum/Nodes: Normal appearance of the thyroid gland. The trachea appears patent and is midline. Normal appearance of the esophagus. No axillary, supraclavicular, mediastinal, or hilar lymph nodes. Lungs/Pleura: Large left pleural effusion is identified. Compared with 10/18/2020 this exhibits significant interval increase in volume with near complete atelectasis of the left lung. Previously noted 3 mm left lower lobe lung nodule has resolved in the interval. No new right lung nodules. Previously noted left lung nodules cannot be reassessed due to near complete atelectasis of the  left lung. Upper Abdomen: No acute abnormality within the imaged portions of the  upper abdomen. Musculoskeletal: Similar appearance of multifocal mixed lytic and sclerotic bone metastases, including: -Lytic lesion involving the left pedicle of T5, image 82/6. -large permeative lesion with pathologic fracture involving the left scapula, image 25/2. -expansile lytic lesion involving the posterolateral aspect of the left seventh rib, image 55/2. IMPRESSION: 1. Significant interval increase in volume of left pleural effusion with near complete atelectasis of the left lung. 2. No significant interval change in diffuse mixed lytic and sclerotic bone metastases. 3. New filling defect is identified left main pulmonary artery consistent with age indeterminate pulmonary embolus. No additional pulmonary emboli identified. 4. Coronary artery calcifications noted. 5. Aortic atherosclerosis. Aortic Atherosclerosis (ICD10-I70.0). Electronically Signed: By: Kerby Moors M.D. On: 01/24/2021 08:56   MR HUMERUS RIGHT W WO CONTRAST  Result Date: 01/07/2021 CLINICAL DATA:  Pathologic proximal humerus fracture. History of metastatic breast cancer. EXAM: MRI OF THE RIGHT SHOULDER WITHOUT AND WITH CONTRAST; MRI OF THE RIGHT HUMERUS WITHOUT AND WITH CONTRAST TECHNIQUE: Multiplanar, multisequence MR imaging of the right shoulder and humerus was performed before and after the administration of intravenous contrast. CONTRAST:  72m GADAVIST GADOBUTROL 1 MMOL/ML IV SOLN COMPARISON:  Right humerus x-rays from yesterday. Right shoulder x-rays dated October 24, 2020. FINDINGS: Rotator cuff:  Intact rotator cuff.  Moderate tendinosis. Muscles: Diffuse muscle edema about the right shoulder, presumably related to prior radiation. No significant atrophy. Biceps long head:  Intact and normally positioned. Acromioclavicular Joint: Moderate arthropathy of the acromioclavicular joint. Type I acromion. No subacromial/subdeltoid bursal fluid.  Glenohumeral Joint: Small joint effusion.  No chondral defect. Labrum:  Intact. Bones: Large marrow replacing lesion involving the majority of the humeral head and proximal metadiaphysis, spanning a length of approximately 8.5 cm, best appreciated on the sagittal T1 images. There is a subacute mildly displaced pathologic fracture of the proximal metadiaphysis with 5 mm anterior displacement. There is a small amount of subperiosteal fluid. No additional metastatic lesions involving the right upper arm. Incompletely evaluated metastases involving multiple thoracic vertebral bodies. Other: Scattered soft tissue swelling of the right upper arm. Partially visualized left pleural effusion. IMPRESSION: 1. Large osseous metastasis involving the majority of the humeral head and proximal metadiaphysis, spanning a length of approximately 8.5 cm. Associated subacute mildly displaced pathologic fracture of the proximal metadiaphysis. 2. No additional metastatic lesions involving the right upper arm. 3. Incompletely evaluated thoracic vertebral metastatic disease. 4. Intact rotator cuff with moderate tendinosis. Electronically Signed   By: WTitus DubinM.D.   On: 01/07/2021 13:18   MR SHOULDER RIGHT W WO CONTRAST  Result Date: 01/07/2021 CLINICAL DATA:  Pathologic proximal humerus fracture. History of metastatic breast cancer. EXAM: MRI OF THE RIGHT SHOULDER WITHOUT AND WITH CONTRAST; MRI OF THE RIGHT HUMERUS WITHOUT AND WITH CONTRAST TECHNIQUE: Multiplanar, multisequence MR imaging of the right shoulder and humerus was performed before and after the administration of intravenous contrast. CONTRAST:  198mGADAVIST GADOBUTROL 1 MMOL/ML IV SOLN COMPARISON:  Right humerus x-rays from yesterday. Right shoulder x-rays dated October 24, 2020. FINDINGS: Rotator cuff:  Intact rotator cuff.  Moderate tendinosis. Muscles: Diffuse muscle edema about the right shoulder, presumably related to prior radiation. No significant atrophy.  Biceps long head:  Intact and normally positioned. Acromioclavicular Joint: Moderate arthropathy of the acromioclavicular joint. Type I acromion. No subacromial/subdeltoid bursal fluid. Glenohumeral Joint: Small joint effusion.  No chondral defect. Labrum:  Intact. Bones: Large marrow replacing lesion involving the majority of the humeral head and proximal metadiaphysis, spanning a length of approximately 8.5 cm,  best appreciated on the sagittal T1 images. There is a subacute mildly displaced pathologic fracture of the proximal metadiaphysis with 5 mm anterior displacement. There is a small amount of subperiosteal fluid. No additional metastatic lesions involving the right upper arm. Incompletely evaluated metastases involving multiple thoracic vertebral bodies. Other: Scattered soft tissue swelling of the right upper arm. Partially visualized left pleural effusion. IMPRESSION: 1. Large osseous metastasis involving the majority of the humeral head and proximal metadiaphysis, spanning a length of approximately 8.5 cm. Associated subacute mildly displaced pathologic fracture of the proximal metadiaphysis. 2. No additional metastatic lesions involving the right upper arm. 3. Incompletely evaluated thoracic vertebral metastatic disease. 4. Intact rotator cuff with moderate tendinosis. Electronically Signed   By: Titus Dubin M.D.   On: 01/07/2021 13:18   DG CHEST PORT 1 VIEW  Result Date: 01/14/2021 CLINICAL DATA:  Pleural effusion EXAM: PORTABLE CHEST 1 VIEW COMPARISON:  01/13/2021 FINDINGS: There is now complete opacification of the left hemithorax likely reflecting a combination of increasing pleural fluid and atelectatic change. The right lung remains clear. No right effusion. No visible pneumothorax. Cardiomediastinal contours are largely obscured by overlying opacity. Surgical clips in the right axilla. Postsurgical changes from prior 8 per right humeral intramedullary nail placement. Mild appearance of the  left scapula compatible known osseous metastasis. Additional irregular osseous metastatic disease seen in multiple ribs as well. IMPRESSION: Complete opacification of the left hemithorax likely reflecting a combination of increasing pleural fluid and atelectatic change. Electronically Signed   By: Lovena Le M.D.   On: 01/14/2021 05:10   DG CHEST PORT 1 VIEW  Result Date: 01/10/2021 CLINICAL DATA:  Pleural effusion with cough.  Breast carcinoma EXAM: PORTABLE CHEST 1 VIEW COMPARISON:  January 06, 2021 chest radiograph and right humerus radiographs FINDINGS: Most of the left hemithorax remains opacified, likely due to combination of pleural effusion and atelectasis/consolidation throughout most of the left lung. Mild aeration remains in a portion of the left upper lobe. The right lung is clear. Heart is prominent, stable, with normal appearing pulmonary vascularity on the right. Pulmonary vascularity on the left is obscured by apparent effusion and infiltrate. Surgical clips noted in the right axilla. Ill-defined mixed sclerotic and lytic changes noted in the left glenoid region. Apparent pathologic fracture proximal right humerus again noted. IMPRESSION: Opacification of most of the left hemithorax, likely due to combination of sizable pleural effusion and areas of atelectasis/consolidation. Right lung clear. Grossly stable cardiac silhouette. Bony metastases noted. Apparent pathologic fracture proximal right humerus, documented previously and not significantly changed. Surgical clips in right axilla. Electronically Signed   By: Lowella Grip III M.D.   On: 01/10/2021 08:41   DG Humerus Right  Result Date: 01/11/2021 CLINICAL DATA:  Right humerus fracture. EXAM: RIGHT HUMERUS - 2+ VIEW COMPARISON:  01/06/2021 FINDINGS: Again noted is a fracture involving the proximal humerus. Intramedullary rod has been placed with 3 proximal interlocking screws and 2 distal interlocking screws. Improved alignment of the  humerus with near anatomic alignment. Right shoulder appears located on these views. Extensive degenerative changes at the right [facility] joint. IMPRESSION: Internal fixation of the proximal right humerus fracture. Improved alignment of the humerus. Electronically Signed   By: Markus Daft M.D.   On: 01/11/2021 16:43   DG Humerus Right  Result Date: 01/11/2021 CLINICAL DATA:  Proximal humerus fracture. EXAM: RIGHT HUMERUS - 2+ VIEW COMPARISON:  01/06/2021. FINDINGS: Intraoperative images demonstrate an intramedullary rod in the humerus with 3 proximal interlocking screws. Two  distal interlocking screws. IMPRESSION: Internal fixation of the right humerus fracture. Electronically Signed   By: Markus Daft M.D.   On: 01/11/2021 16:37   DG Humerus Right  Result Date: 01/06/2021 CLINICAL DATA:  Possible fracture on chest radiograph. Pain for several days. No known injury. EXAM: RIGHT HUMERUS - 2+ VIEW COMPARISON:  10/24/2020 right shoulder radiographs. FINDINGS: Transverse fracture of the proximal right humeral neck with mild lateral angulation of the distal fracture fragment. The fracture appears to be old with callus formation present although union is incomplete. Mild poorly defined decreased attenuation in the bone at the level of the fracture may indicate a pathologic fracture. The lesion was demonstrated in the proximal right humerus on the previous shoulder study. No expansile changes. Soft tissues are unremarkable. IMPRESSION: Transverse pathologic fracture of the proximal right humerus with mild angulation. Callus formation is present without complete union. Electronically Signed   By: Lucienne Capers M.D.   On: 01/06/2021 00:28   DG C-Arm 1-60 Min-No Report  Result Date: 01/11/2021 Fluoroscopy was utilized by the requesting physician.  No radiographic interpretation.   US THORACENTESIS ASP PLEURAL SPACE W/IMG GUIDE  Result Date: 01/13/2021 INDICATION: Patient with history of metastatic breast carcinoma,  dyspnea, recurrent malignant left pleural effusion. Request received for diagnostic and therapeutic left thoracentesis. EXAM: ULTRASOUND GUIDED DIAGNOSTIC AND THERAPEUTIC LEFT THORACENTESIS MEDICATIONS: 1% lidocaine to skin and subcutaneous tissue COMPLICATIONS: None immediate. PROCEDURE: An ultrasound guided thoracentesis was thoroughly discussed with the patient and questions answered. The benefits, risks, alternatives and complications were also discussed. The patient understands and wishes to proceed with the procedure. Written consent was obtained. Ultrasound was performed to localize and mark an adequate pocket of fluid in the left chest. The area was then prepped and draped in the normal sterile fashion. 1% Lidocaine was used for local anesthesia. Under ultrasound guidance a 6 Fr Safe-T-Centesis catheter was introduced. Thoracentesis was performed. The catheter was removed and a dressing applied. FINDINGS: A total of approximately 570 cc of dark, bloody fluid was removed. Samples were sent to the laboratory as requested by the clinical team. Due to patient chest discomfort only the above amount of fluid was removed today. IMPRESSION: Successful ultrasound guided diagnostic and therapeutic left thoracentesis yielding 570 cc of pleural fluid. Read by: Rowe Robert, PA-C Electronically Signed   By: Ruthann Cancer MD   On: 01/13/2021 11:50   US THORACENTESIS ASP PLEURAL SPACE W/IMG GUIDE  Result Date: 01/06/2021 INDICATION: Patient with history of metastatic breast carcinoma, dyspnea, recurrent malignant left pleural effusion; request received for diagnostic and therapeutic left thoracentesis. EXAM: ULTRASOUND GUIDED DIAGNOSTIC AND THERAPEUTIC LEFT THORACENTESIS MEDICATIONS: 1% lidocaine to skin and subcutaneous tissue COMPLICATIONS: None immediate. PROCEDURE: An ultrasound guided thoracentesis was thoroughly discussed with the patient and questions answered. The benefits, risks, alternatives and complications  were also discussed. The patient understands and wishes to proceed with the procedure. Written consent was obtained. Ultrasound was performed to localize and mark an adequate pocket of fluid in the left chest. The area was then prepped and draped in the normal sterile fashion. 1% Lidocaine was used for local anesthesia. Under ultrasound guidance a 6 Fr Safe-T-Centesis catheter was introduced. Thoracentesis was performed. The catheter was removed and a dressing applied. FINDINGS: A total of approximately 1.2 liters of blood-tinged fluid was removed. Samples were sent to the laboratory as requested by the clinical team. Due to patient chest discomfort only the above amount of fluid was removed today. IMPRESSION: Successful ultrasound guided diagnostic  and therapeutic left thoracentesis yielding 1.2 liters of pleural fluid. Read by: Rowe Robert, PA-C Electronically Signed   By: Ruthann Cancer MD   On: 01/06/2021 13:14    Refuses mammography at this point (12/23/2019)   ELIGIBLE FOR AVAILABLE RESEARCH PROTOCOL: no   ASSESSMENT: 65 y.o. [place], [place] woman with stage IV breast cancer, as follows:  (1) status post right mastectomy in 2000, for a pT2 pN2, stage IIIA invasive ductal carcinoma, estrogen receptor positive, progesterone receptor not tested, HER-2 negative (0) by immunohistochemistry  (a) adjuvant chemotherapy with doxorubicin and cyclophosphamide in dose dense fashion x4 followed by paclitaxel in dose dense fashion x4  (b) adjuvant radiation: 30 doses  (c) antiestrogens: Tamoxifen for 5 years, completed 2005  (2) METASTASTIC DISEASE: 2008, involving bones, lungs, and lymph nodes  (a) CA 27-29 is informative  (b) CT of the chest abdomen and pelvis in [facility] finds stable sclerotic metastases (T8, T10, T11, sternum, L4, L5); 0.6 cm left upper lobe lung nodule stable  (3)  prior anti-estrogen treatments:  (a) fulvestrant--progression  (b)  exemestane/everolimus--hyperlipidemia, hyperglycemia  (4) prior chemotherapy treatments:  (a) capecitabine: progression  (b) Abraxane, August 2014 through 11/18/2015: good response but stopped due to neuropathy  (c) gemcitabine 01/20/2016--?; multiple interruptions secondary to infections  (5) radiation therapy:  (a) T spine and Right femur, completed 01/10/2016  (6) letrozole/ palbociclib started 07/13/2016, discontinued 10/26/2020 with evidence of progression  (7) bone treatment:  (a) denosumab/Xgeva--discontinued January 2016 due to osteonecrosis of the jaw  (8) cancer associated pain: Updated 12/01/2020 (a) OxyIR 5 mg QID PRN  (b) PMP Aware reviewed 12/01/2020  (c) OxyContin 20 mg p.o. 3 times daily  (d) bowel prophylaxis: MiraLAX as needed  (9) restaging studies:  (a) mostly stable bone lesions with evidence of progression at L3-L5; no epidural tumor by total spinal MRI in November 2019  (b) no evidence of progressive lung, lymph node or liver lesions by CT scans of the chest abdomen and pelvis and bone scan December 2019  (c) CT scans abd/pelvis 03/31/2019 shows stable bone metastases, no extraosseous disease  (d) MRI pelvis 04/09/2019 shows bilateral ilium, left acetabulum and lumbar mets, stable  (e) CT abd/pelvis 09/01/2019 shows stable bone mets, slight increase left effusion  (f) CT of the chest 05/05/2020 again shows minimal increase in the left pleural effusion, possible resorption of bone around the left glenoid lesion, otherwise stable  (g) CT of the chest abdomen and pelvis 10/18/2020 again shows no evidence of visceral disease, but there is apparent progression in the bone disease, and some enlargement of the left pleural effusion.  The CA 27-29 has also been rising.  Accordingly her letrozole and palbociclib are being discontinued  (10) palliative radiation 11/18/2018 through 12/02/2018 Site/dose:   1. Lumbar spine; 35 Gy in 14 fractions of 2.5 Gy                       2. Pelvis; 35 Gy in 14 fractions of 2.5 Gy  (11) started cyclophosphamide, methotrexate and fluorouracil [CMF] 11/10/2020  (a) methotrexate held first dose, during palliative radiation  (12) status post left thoracentesis 11/21/2020, with positive cytology  (a) tumor cells are strongly estrogen and progesterone receptor positive, HER-2 negative  (b) foundation 1 requested on the thoracentesis sample shows a PI K3 CA mutation.  Microsatellite status is stable and the tumor mutation burden is low.  There are mutations in ESR 1, GAT A3, and [place] 6  (13)  status post Right humeral shaft intramedullary nail fixation Right proximal humeral nonunion treatment 01/12/2021 Griffin Basil)  (14) alpelisib started January 27, 2021, fulvestrant to be started 02/09/2021   PLAN: Kathy's pain is much better controlled after her shoulder surgery.  She is comfortable at rest but is very limited in her functional status because of her breathing.  At this point she does not feel she needs thoracentesis.  She has started the alpelisib and has had no side effects from it so far but she has not checked her blood sugar.  In fact she has not been checking her blood sugar now for quite some time.  We are calling in a One Touch testing machine and some slips and I asked her to test her blood sugar twice a day for the next several days, going from before breakfast and before supper 1 day and before lunch and at bedtime the next day and call us if the sugars are greater than 300 or lower than 90.  She is scheduled to return for Faslodex 05 12, and then again 05 26 and 0609, after which it will be on a monthly basis.  She will be restaged after at least 2 months on the current therapy assuming she can tolerate it.  I will have another virtual visit with her 02/02/2021 primarily to check the blood sugar results   Sarajane Jews C. Isac Lincks, MD 01/31/21 2:09 PM Medical Oncology and Hematology [facility] [place], [place] [address] Tel. [phone]    Fax. [phone]   I, Wilburn Mylar, am acting as scribe for Dr. Virgie Dad. Mat Stuard.  I, Lurline Del MD, have reviewed the above documentation for accuracy and completeness, and I agree with the above.    *Total Encounter Time as defined by the Centers for Medicare and Medicaid Services includes, in addition to the face-to-face time of a patient visit (documented in the note above) non-face-to-face time: obtaining and reviewing outside history, ordering and reviewing medications, tests or procedures, care coordination (communications with other health care professionals or caregivers) and documentation in the medical record.

## 2021-01-31 NOTE — Progress Notes (Signed)
Rx sent for diabetic monitoring kit with supplies per MD orders.  (90 Lancets and 90 test strips).

## 2021-02-01 ENCOUNTER — Encounter: Payer: Self-pay | Admitting: Family Medicine

## 2021-02-01 DIAGNOSIS — C7802 Secondary malignant neoplasm of left lung: Secondary | ICD-10-CM | POA: Diagnosis not present

## 2021-02-01 DIAGNOSIS — C7951 Secondary malignant neoplasm of bone: Secondary | ICD-10-CM | POA: Diagnosis not present

## 2021-02-01 DIAGNOSIS — M84521D Pathological fracture in neoplastic disease, right humerus, subsequent encounter for fracture with routine healing: Secondary | ICD-10-CM | POA: Diagnosis not present

## 2021-02-01 DIAGNOSIS — S42294D Other nondisplaced fracture of upper end of right humerus, subsequent encounter for fracture with routine healing: Secondary | ICD-10-CM | POA: Diagnosis not present

## 2021-02-01 DIAGNOSIS — C50911 Malignant neoplasm of unspecified site of right female breast: Secondary | ICD-10-CM | POA: Diagnosis not present

## 2021-02-01 DIAGNOSIS — D630 Anemia in neoplastic disease: Secondary | ICD-10-CM | POA: Diagnosis not present

## 2021-02-01 DIAGNOSIS — C7801 Secondary malignant neoplasm of right lung: Secondary | ICD-10-CM | POA: Diagnosis not present

## 2021-02-02 ENCOUNTER — Encounter: Payer: Self-pay | Admitting: Oncology

## 2021-02-02 ENCOUNTER — Other Ambulatory Visit: Payer: Self-pay | Admitting: Oncology

## 2021-02-02 ENCOUNTER — Telehealth: Payer: Self-pay | Admitting: *Deleted

## 2021-02-02 ENCOUNTER — Encounter: Payer: Self-pay | Admitting: Family Medicine

## 2021-02-02 DIAGNOSIS — E669 Obesity, unspecified: Secondary | ICD-10-CM

## 2021-02-02 DIAGNOSIS — E1169 Type 2 diabetes mellitus with other specified complication: Secondary | ICD-10-CM

## 2021-02-02 DIAGNOSIS — S42294D Other nondisplaced fracture of upper end of right humerus, subsequent encounter for fracture with routine healing: Secondary | ICD-10-CM | POA: Diagnosis not present

## 2021-02-02 NOTE — Telephone Encounter (Signed)
Unsuccessful Prior authorization attempts two prescription benefit plans received from [facility] after CoverMyMeds Unable to find matching patient.  "[name] with [employer], no active account.  Plan ended 01/28/2021.  Contact patient for Prescription Insurance Benefit information.   Message left for Mia [name] [phone]) requesting return call to providers nurse with prescription benefit information.  This PA nurse returns 02/06/2021.  Obtain the following to process prior authorizations:     Current Insurance or PBM name  Member ID with code number if shared coverage with spouse or family.  Pharmacy help desk number Also obtain for on-line authorizations like CoverMyMeds:    RxBIN  RxPCN  RxGroup   Needymeds.org and goodrx.com provide further assistance using link to Manufacturer co-pay discount coupon card.   "Patient Assistance Applications" also available on these sites if no active coverage confirmed.

## 2021-02-02 NOTE — Telephone Encounter (Signed)
This RN returned VM left by pt- note pt was supposed to have a mychart visit this AM for review of blood sugars and further recommendations.  She states readings starting at 830 pm on 5/3 was 385 5/4 AM before breakfast was 272 and pm before dinner was 380.  Per MD review of above- recommendation given for the patient to hold the alpelisib(Piqray) until 5/9 and then to restart that am at 1/2 dose which would be 1 tablet ( 150 mg ) in am.  Continue to check blood sugars as discussed and call Tuesday pm with readings.  This RN called pt and obtained VM- message left per above as well as this RN will call her later today for verification of understanding of plan.

## 2021-02-06 ENCOUNTER — Other Ambulatory Visit (HOSPITAL_COMMUNITY): Payer: Self-pay

## 2021-02-06 ENCOUNTER — Other Ambulatory Visit (HOSPITAL_COMMUNITY): Payer: MEDICARE

## 2021-02-06 ENCOUNTER — Encounter: Payer: Self-pay | Admitting: Oncology

## 2021-02-06 ENCOUNTER — Encounter: Payer: Self-pay | Admitting: *Deleted

## 2021-02-06 ENCOUNTER — Other Ambulatory Visit (HOSPITAL_COMMUNITY)
Admission: RE | Admit: 2021-02-06 | Discharge: 2021-02-06 | Disposition: A | Discharge status: Home / Self Care | Payer: MEDICARE | Source: Ambulatory Visit | Attending: Oncology | Admitting: Oncology

## 2021-02-06 ENCOUNTER — Telehealth: Payer: Self-pay | Admitting: *Deleted

## 2021-02-06 DIAGNOSIS — Z01812 Encounter for preprocedural laboratory examination: Secondary | ICD-10-CM | POA: Diagnosis not present

## 2021-02-06 DIAGNOSIS — Z20822 Contact with and (suspected) exposure to covid-19: Secondary | ICD-10-CM | POA: Diagnosis not present

## 2021-02-06 MED ORDER — CVS GLUCOSE METER TEST STRIPS VI STRP
ORAL_STRIP | 12 refills | Status: AC
Start: 2021-02-06 — End: ?

## 2021-02-06 MED ORDER — FREESTYLE LANCETS MISC: 12 refills | Status: AC

## 2021-02-06 NOTE — Telephone Encounter (Signed)
This RN contacted [facility] per My chart message from pt stating she will have her PleurX catheter placed on 5/12. Per Burman Nieves at [facility]- will need to review if they can provide initial care and teaching under nursing- Pt will be seen for OT and PT per Dr Ophelia Charter s/p ortho surgery.  Maggie states she will need to verify with Dr Griffin Basil that nursing orders per another MD needs to be added to her current orders as well as if they can actually provide the nurse for this care.  This RN informed Burman Nieves- Dr [name] will be sending orders for the nursing care- with goal of teaching the husband how to provide the care.  Informed as well that pt will be d/ced day of placement with all supplies- so reimbursement for benefit should allow payment of home nursing ( not to include the supplies).  Burman Nieves will review above with Dr Rich Fuchs office as well as with case manager- and return call to this RN.  Pt made aware of above.  This message will be forwarded to Dr [name] and Dr Griffin Basil for review of communication for continuity of care.

## 2021-02-07 ENCOUNTER — Other Ambulatory Visit: Payer: Self-pay | Admitting: Radiology

## 2021-02-07 ENCOUNTER — Telehealth: Payer: Self-pay | Admitting: *Deleted

## 2021-02-07 DIAGNOSIS — S42294D Other nondisplaced fracture of upper end of right humerus, subsequent encounter for fracture with routine healing: Secondary | ICD-10-CM | POA: Diagnosis not present

## 2021-02-07 LAB — SARS CORONAVIRUS 2 (TAT 6-24 HRS): SARS Coronavirus 2: NEGATIVE

## 2021-02-07 NOTE — Telephone Encounter (Signed)
Obtained prescription benefit change information today from collaborative.   [employer] Silver Script ID #: [id number].  Created CoverMyMeds KEY: [id number] for [employer].  Information regarding request reads: "The patient currently has access to the requested medication and a Prior Authorization is not needed for the patient/medication."  Thamara Leger [phone]) confirms "Receipt through [facility] who used a new patient coupon." Advised to request this nurse for future supportive medication prior authorization needs.  No further activity by this nurse.

## 2021-02-07 NOTE — Telephone Encounter (Signed)
This RN received VM from Gevena Cotton casemanager RN with [facility] per request for nursing care for teaching and dressing changes for PleurX being placed this Thursday.  [facility] will be providing home health PT and OT per orthopedist s/p surgery.  Per message - " per my supervisors review we cannot accept this patient at all due to nursing staffing restraints" Her return call number given as [phone].  This RN will follow up for further resources or facilities to assist with PleurX care.

## 2021-02-08 ENCOUNTER — Ambulatory Visit (HOSPITAL_COMMUNITY): Payer: MEDICARE

## 2021-02-08 ENCOUNTER — Other Ambulatory Visit (HOSPITAL_COMMUNITY): Payer: MEDICARE

## 2021-02-09 ENCOUNTER — Other Ambulatory Visit: Payer: Self-pay | Admitting: Radiology

## 2021-02-09 ENCOUNTER — Inpatient Hospital Stay: Payer: MEDICARE

## 2021-02-09 LAB — FUNGAL ORGANISM REFLEX

## 2021-02-09 LAB — FUNGUS CULTURE WITH STAIN

## 2021-02-09 LAB — FUNGUS CULTURE RESULT

## 2021-02-10 ENCOUNTER — Other Ambulatory Visit: Payer: Self-pay

## 2021-02-10 ENCOUNTER — Ambulatory Visit (HOSPITAL_COMMUNITY)
Admission: RE | Admit: 2021-02-10 | Discharge: 2021-02-10 | Disposition: A | Discharge status: Home / Self Care | Payer: MEDICARE | Source: Ambulatory Visit | Attending: Medical | Admitting: Medical

## 2021-02-10 ENCOUNTER — Encounter: Payer: Self-pay | Admitting: *Deleted

## 2021-02-10 ENCOUNTER — Encounter (HOSPITAL_COMMUNITY): Payer: Self-pay

## 2021-02-10 ENCOUNTER — Other Ambulatory Visit: Payer: Self-pay | Admitting: Medical

## 2021-02-10 DIAGNOSIS — C7800 Secondary malignant neoplasm of unspecified lung: Secondary | ICD-10-CM

## 2021-02-10 DIAGNOSIS — R0602 Shortness of breath: Secondary | ICD-10-CM | POA: Diagnosis not present

## 2021-02-10 DIAGNOSIS — C3490 Malignant neoplasm of unspecified part of unspecified bronchus or lung: Secondary | ICD-10-CM | POA: Diagnosis not present

## 2021-02-10 DIAGNOSIS — J90 Pleural effusion, not elsewhere classified: Secondary | ICD-10-CM

## 2021-02-10 DIAGNOSIS — C50919 Malignant neoplasm of unspecified site of unspecified female breast: Secondary | ICD-10-CM

## 2021-02-10 DIAGNOSIS — J910 Malignant pleural effusion: Secondary | ICD-10-CM | POA: Diagnosis not present

## 2021-02-10 HISTORY — DX: Nausea with vomiting, unspecified: Z98.890

## 2021-02-10 HISTORY — DX: Nausea with vomiting, unspecified: R11.2

## 2021-02-10 HISTORY — DX: Dyspnea, unspecified: R06.00

## 2021-02-10 HISTORY — PX: IR PERC PLEURAL DRAIN W/INDWELL CATH W/IMG GUIDE: IMG5383

## 2021-02-10 LAB — CBC WITH DIFFERENTIAL/PLATELET
Abs Immature Granulocytes: 0.09 10*3/uL — ABNORMAL HIGH (ref 0.00–0.07)
Basophils Absolute: 0.1 10*3/uL (ref 0.0–0.1)
Basophils Relative: 1 %
Eosinophils Absolute: 0.2 10*3/uL (ref 0.0–0.5)
Eosinophils Relative: 2 %
HCT: 28.2 % — ABNORMAL LOW (ref 36.0–46.0)
Hemoglobin: 8.5 g/dL — ABNORMAL LOW (ref 12.0–15.0)
Immature Granulocytes: 1 %
Lymphocytes Relative: 5 %
Lymphs Abs: 0.5 10*3/uL — ABNORMAL LOW (ref 0.7–4.0)
MCH: 28 pg (ref 26.0–34.0)
MCHC: 30.1 g/dL (ref 30.0–36.0)
MCV: 92.8 fL (ref 80.0–100.0)
Monocytes Absolute: 0.8 10*3/uL (ref 0.1–1.0)
Monocytes Relative: 8 %
Neutro Abs: 7.9 10*3/uL — ABNORMAL HIGH (ref 1.7–7.7)
Neutrophils Relative %: 83 %
Platelets: 430 10*3/uL — ABNORMAL HIGH (ref 150–400)
RBC: 3.04 MIL/uL — ABNORMAL LOW (ref 3.87–5.11)
RDW: 16.6 % — ABNORMAL HIGH (ref 11.5–15.5)
WBC: 9.6 10*3/uL (ref 4.0–10.5)
nRBC: 0 % (ref 0.0–0.2)

## 2021-02-10 LAB — GLUCOSE, CAPILLARY: Glucose-Capillary: 108 mg/dL — ABNORMAL HIGH (ref 70–99)

## 2021-02-10 LAB — BASIC METABOLIC PANEL
Anion gap: 6 (ref 5–15)
BUN: 12 mg/dL (ref 8–23)
CO2: 31 mmol/L (ref 22–32)
Calcium: 8.9 mg/dL (ref 8.9–10.3)
Chloride: 96 mmol/L — ABNORMAL LOW (ref 98–111)
Creatinine, Ser: 0.94 mg/dL (ref 0.44–1.00)
GFR, Estimated: >60 mL/min (ref >60)
Glucose, Bld: 108 mg/dL — ABNORMAL HIGH (ref 70–99)
Potassium: 4.8 mmol/L (ref 3.5–5.1)
Sodium: 133 mmol/L — ABNORMAL LOW (ref 135–145)

## 2021-02-10 LAB — PROTIME-INR
INR: 1.3 — ABNORMAL HIGH (ref 0.8–1.2)
Prothrombin Time: 15.7 seconds — ABNORMAL HIGH (ref 11.4–15.2)

## 2021-02-10 MED ORDER — SODIUM CHLORIDE 0.9 % IV SOLN: INTRAVENOUS | Status: DC

## 2021-02-10 MED ORDER — LIDOCAINE HCL (PF) 1 % IJ SOLN
INTRAMUSCULAR | Status: AC | PRN
  Administered 2021-02-10: 10 mL via INTRADERMAL

## 2021-02-10 MED ORDER — CEFAZOLIN SODIUM-DEXTROSE 2-4 GM/100ML-% IV SOLN
INTRAVENOUS | Status: AC
  Administered 2021-02-10: 2 g via INTRAVENOUS
  Filled 2021-02-10: qty 100

## 2021-02-10 MED ORDER — FENTANYL CITRATE (PF) 100 MCG/2ML IJ SOLN
INTRAMUSCULAR | Status: AC
  Filled 2021-02-10: qty 2

## 2021-02-10 MED ORDER — MIDAZOLAM HCL 2 MG/2ML IJ SOLN
INTRAMUSCULAR | Status: AC | PRN
  Administered 2021-02-10: 1 mg via INTRAVENOUS
  Administered 2021-02-10 (×2): 0.5 mg via INTRAVENOUS

## 2021-02-10 MED ORDER — CEFAZOLIN SODIUM-DEXTROSE 2-4 GM/100ML-% IV SOLN: 2.0000 g | INTRAVENOUS | Status: AC

## 2021-02-10 MED ORDER — FENTANYL CITRATE (PF) 100 MCG/2ML IJ SOLN
INTRAMUSCULAR | Status: AC | PRN
  Administered 2021-02-10: 25 ug via INTRAVENOUS
  Administered 2021-02-10: 50 ug via INTRAVENOUS

## 2021-02-10 MED ORDER — LIDOCAINE HCL 1 % IJ SOLN
INTRAMUSCULAR | Status: AC
  Filled 2021-02-10: qty 20

## 2021-02-10 MED ORDER — MIDAZOLAM HCL 2 MG/2ML IJ SOLN
INTRAMUSCULAR | Status: AC
  Filled 2021-02-10: qty 4

## 2021-02-10 NOTE — H&P (Signed)
Referring Physician(s): Sandi Mealy E./Magrinat,G  Supervising Physician: Aletta Edouard  Patient Status:  [facility] OP  Chief Complaint:  Recurrent symptomatic malignant left pleural effusion, breast cancer  Subjective: Patient familiar to IR service from multiple left thoracenteses, latest on 01/13/2021.  She has a history of stage IV metastatic breast cancer and PE with recurrent malignant left pleural effusion.  She presents today for left Pleurx catheter placement.  She currently denies fever, headache, chest pain, abdominal pain, nausea, vomiting or bleeding.  She does have dyspnea, occasional cough, back pain, right upper extremity lymphedema and lower extremity edema.  Additional medical history as below.  Past Medical History:  Diagnosis Date  . Breast cancer metastasized to bone ([place])   . Diabetes mellitus without complication ([place])   . History of radiation therapy 11/18/2018-12/02/2018   lumbar spine and pelvis   Dr [name]  . History of radiation therapy 11/08/2020-11/18/2020   right arm   Dr [name]  . Hyperlipidemia   . Hypertension   . Lymphedema of right arm   . Neuropathy associated with cancer ([place])   . Obesity (BMI 35.0-39.9 without comorbidity)    Past Surgical History:  Procedure Laterality Date  . CATARACT EXTRACTION Left   . HUMERUS IM NAIL Right 01/11/2021   Procedure: INTRAMEDULLARY (IM) NAIL HUMERAL;  Surgeon: Hiram Gash, MD;  Location: [facility] ORS;  Service: Orthopedics;  Laterality: Right;  . HYSTERECTOMY ABDOMINAL WITH SALPINGO-OOPHORECTOMY    . MODIFIED RADICAL MASTECTOMY Right       Allergies: Morphine and related  Medications: Prior to Admission medications   Medication Sig Start Date End Date Taking? Authorizing Provider  glucose blood ([facility] GLUCOSE METER TEST STRIPS) test strip Use as instructed 02/06/21   Ronnald Nian, DO  Lancets (FREESTYLE) lancets Use as instructed 02/06/21   Cirigliano, Mary K, DO  alpelisib (PIQRAY 300MG DAILY  DOSE) 2 x 150 MG Therapy Pack Take 300 mg by mouth daily. Take with food on days 1-28 at the same time daily. Swallow whole, do not crush, chew, or split. 01/06/21   Magrinat, Virgie Dad, MD  amLODipine (NORVASC) 10 MG tablet Take 10 mg by mouth daily. 09/22/20   [provider]  aspirin 81 MG chewable tablet Chew 81 mg by mouth daily.    [provider]  blood glucose meter kit and supplies KIT Dispense based on patient and insurance preference. Use up to four times daily as directed. 01/31/21   Magrinat, Virgie Dad, MD  COVID-19 mRNA vaccine, Pfizer, 30 MCG/0.3ML injection AS DIRECTED 08/16/20 08/16/21  Carlyle Basques, MD  fenofibrate (TRICOR) 145 MG tablet TAKE 1 TABLET BY MOUTH EVERY DAY Patient taking differently: Take 145 mg by mouth daily. 12/21/20   Cirigliano, Mary K, DO  fluticasone (FLONASE) 50 MCG/ACT nasal spray SPRAY 2 SPRAYS INTO EACH NOSTRIL EVERY DAY Patient taking differently: Place 1 spray into both nostrils daily. 10/03/20   Cirigliano, Garvin Fila, DO  gabapentin (NEURONTIN) 300 MG capsule Take 1 capsule (300 mg total) by mouth 4 (four) times daily. Pt is only taking 2 capsules a day Patient taking differently: Take 300 mg by mouth 2 (two) times daily. 12/28/20   Magrinat, Virgie Dad, MD  ibuprofen (ADVIL) 800 MG tablet Take 800 mg by mouth every 8 (eight) hours as needed for moderate pain. 10/26/20   Magrinat, Virgie Dad, MD  Insulin Admin Supplies MISC Inject 24 Units into the skin daily.    [provider]  insulin glargine (LANTUS SOLOSTAR)  100 UNIT/ML Solostar Pen INJECT 24 UNITS INTO THE SKIN DAILY Patient taking differently: Inject 24 Units into the skin daily. 10/17/20   Cirigliano, Garvin Fila, DO  Insulin Pen Needle 31G X 5 MM MISC UAD for Sq inj for DM qd 01/13/20   Libby Maw, MD  losartan (COZAAR) 50 MG tablet Take 1 tablet (50 mg total) by mouth 2 (two) times daily. 12/21/20   Cirigliano, Garvin Fila, DO  metoprolol succinate (TOPROL-XL) 25 MG 24 hr tablet  TAKE 1 TABLET BY MOUTH EVERY DAY Patient taking differently: Take 25 mg by mouth daily. 10/17/20   Cirigliano, Garvin Fila, DO  oxyCODONE (OXY IR/ROXICODONE) 5 MG immediate release tablet Take 1-2 tablets (5-10 mg total) by mouth every 4 (four) hours as needed for severe pain. 12/06/20   Magrinat, Virgie Dad, MD  oxyCODONE (OXYCONTIN) 20 mg 12 hr tablet Take 1 tablet (20 mg total) by mouth every 8 (eight) hours. 12/01/20   Magrinat, Virgie Dad, MD  pantoprazole (PROTONIX) 40 MG tablet TAKE 1 TABLET BY MOUTH EVERY DAY Patient taking differently: Take 40 mg by mouth daily. 01/02/21   Cirigliano, Mary K, DO  pioglitazone (ACTOS) 15 MG tablet TAKE 1 TABLET BY MOUTH EVERY DAY Patient taking differently: Take 15 mg by mouth daily. 10/06/20   Cirigliano, Mary K, DO  pravastatin (PRAVACHOL) 20 MG tablet TAKE 1 TABLET BY MOUTH EVERY DAY Patient taking differently: Take 20 mg by mouth daily. 12/21/20   Cirigliano, Garvin Fila, DO  RIVAROXABAN Alveda Reasons) VTE STARTER PACK (15 & 20 MG) Follow package directions: Take one 98m tablet by mouth twice a day. On day 22, switch to one 249mtablet once a day. Take with food. 01/24/21   Tanner, VaLyndon Code PA-C  sitaGLIPtin-metformin (JANUMET) 50-1000 MG tablet Take 1 tablet by mouth daily with breakfast. 10/06/20   Cirigliano, MaGarvin FilaDO  Wheat Dextrin-Calcium (BENEFIBER PLUS CALCIUM) CHEW Chew 2 tablets by mouth daily.    [provider]  prochlorperazine (COMPAZINE) 10 MG tablet Take 1 tablet (10 mg total) by mouth every 6 (six) hours as needed (Nausea or vomiting). Patient not taking: No sig reported 10/26/20 01/06/21  Magrinat, GuVirgie DadMD     Vital Signs: BP (!) 110/59   Pulse 81   Temp 98.5 F (36.9 C) (Oral)   Resp 18   LMP 10/01/2006 Comment: Total Hysterectomy  SpO2 100%   Physical Exam awake, alert.  Chest with decreased breath sounds on left, right clear.  Heart with regular rate and rhythm.  Abdomen soft, positive bowel sounds, nontender.  Right upper extremity lymphedema  noted, bilateral pretibial edema noted.  Imaging: No results found.  Labs:  CBC: Recent Labs    01/12/21 0450 01/13/21 0456 01/14/21 0449 01/24/21 1015  WBC 8.7 10.2 8.9 12.3*  HGB 9.6* 8.5* 8.4* 8.7*  HCT 31.8* 27.3* 27.5* 28.7*  PLT 560* 519* 426* 471*    COAGS: Recent Labs    01/10/21 0434  INR 1.1    BMP: Recent Labs    03/16/20 1303 04/13/20 1250 05/11/20 1119 06/08/20 1326 07/06/20 1312 01/12/21 0450 01/13/21 0456 01/14/21 0449 01/24/21 1015  NA 139 142 139 140   < > 137 136 140 137  K 5.3* 4.9 4.4 4.7   < > 5.2* 4.4 4.9 4.6  CL 104 106 106 106   < > 100 98 100 97*  CO2 _0 < > _1 GLUCOSE 100* 98 111* 101*   < >  154* 127* 99 124*  BUN _0 < > _1 7*  CALCIUM 9.8 9.8 10.0 9.4   < > 9.5 9.2 9.0 9.3  CREATININE 0.96 1.25* 1.11* 1.22*   < > 0.80 0.75 0.73 0.66  GFRNONAA >60 46* 53* 47*   < > >60 >60 >60 >60  GFRAA >60 53* >60 55*  --   --   --   --   --    < > = values in this interval not displayed.    LIVER FUNCTION TESTS: Recent Labs    01/08/21 0557 01/12/21 0450 01/13/21 0456 01/24/21 1015  BILITOT 0.9 0.4 0.6 0.5  AST _2 ALT _3 ALKPHOS 83 88 85 144*  PROT 5.2* 5.8* 5.9* 6.3*  ALBUMIN 2.2* 2.6* 2.6* 2.5*    Assessment and Plan: Patient familiar to IR service from multiple left thoracenteses, latest on 01/13/2021.  She has a history of stage IV metastatic breast cancer and PE with recurrent malignant left pleural effusion.  She presents today for left Pleurx catheter placement.  Details/risks of procedure, including but not limited to, internal bleeding, infection, injury to adjacent structures/pneumothorax discussed with patient with her understanding and consent.   Electronically Signed: D. Rowe Robert, PA-C 02/10/2021, 10:49 AM   I spent a total of 20 minutes at the the patient's bedside AND on the patient's hospital floor or unit, greater than 50% of which was  counseling/coordinating care for image guided left Pleurx catheter placement

## 2021-02-10 NOTE — Discharge Instructions (Signed)
Urgent needs - Interventional Radiology on call [facility] office [phone] to confirm Sunday home appointment.  Wound - Dressing and drain care as directed by home health.  Keep site clean and dry. Avoid getting drain insertion site wet, with the exception of cleaning as directed.   Do not submerge in tub or water .     Indwelling Pleural Catheter Home Guide  An indwelling pleural catheter is a thin, flexible tube that is inserted under your skin and into your chest. The catheter drains excess fluid that collects in the area between the chest wall and the lungs (pleural space).After the catheter is inserted, it can be attached to a bottle that collects fluid. The pleural catheter will allow you to drain fluid from your chest at home on a regular basis (sometimes daily). This will eliminate the need for frequent visits to the hospital or clinic to drain the fluid. The catheter may be removed after the excess fluid problem is resolved, usually after 2-3 months. It is important to follow instructions from your health care provider about how to drain and care for your catheter.                 What are the risks? Generally, this is a safe procedure. However, problems may occur, including:  Infection.  Skin damage around the catheter.  Lung damage.  Failure of the chest tube to work properly.  Spreading of cancer cells along the catheter, if you have cancer. Supplies needed:  Vacuum-sealed drainage bottle with attached drainage line.  Sterile dressing.  Sterile alcohol pads.  Sterile gloves.  Valve cap.  Sterile gauze pads, 4  4 inch (10 cm  10 cm).  Tape.  Adhesive dressing.  Sterile foam catheter pad. How to care for your catheter and insertion site  Wash your hands with soap and warm water before and after touching the catheter or insertion site. If soap and water are not available, use hand sanitizer.  Check your  bandage (dressing) daily to make sure it is clean and dry.  Keep the skin around the catheter clean and dry.  Check the catheter regularly for any cracks or kinks in the tubing.  Check your catheter insertion site every day for signs of infection. Check for: ? Skin breakdown. ? Redness, swelling, or pain. ? Fluid or blood. ? Warmth. ? Pus or a bad smell. How to drain your catheter You may need to drain your catheter every day, or more or less often as told by your health care provider. Follow instructions from your health care provider about how to drain your catheter. You may also refer to instructions that come with the drainage system. To drain the catheter: 1. Wash your hands with soap and warm water. If soap and water are not available, use hand sanitizer. 2. Carefully remove the dressing from around the catheter. 3. Wash your hands again. 4. Put on the gloves provided. 5. Prepare the vacuum-sealed drainage bottle and drainage line. Close the drainage line of the vacuum-sealed drainage bottle by squeezing the pinch clamp or rolling the wheel of the roller clamp toward the bottle. The vacuum in the bottle will be lost if the line is not closed completely. 6. Remove the access tip cover from the drainage line. Do not touch the end. Set it on a sterile surface. 7. Remove the catheter valve cap and throw it away. 8. Use an alcohol pad to clean the end of the  catheter. 9. Insert the access tip into the catheter valve. Make sure the valve and access tip are securely connected. Listen for a click to confirm that they are connected. 10. Insert the T plunger to break the vacuum seal on the drainage bottle. 11. Open the clamp on the drainage line. 12. Allow the catheter to drain. Keep the catheter and the drainage bottle below the level of your chest. There may be a one-way valve on the end of the tubing that will allow liquid and air to flow out of the catheter without letting air  inside. 13. Drain the amount of fluid as told by your health care provider. It usually takes 5-15 minutes. Do not drain more than 1000 mL of fluid. You may feel a little discomfort while you are draining. If the pain is severe, stop draining and contact your health care provider. 14. After you finish draining the catheter, remove the drainage bottle tubing from the catheter. 15. Use a clean alcohol pad to wipe the catheter tip. 16. Place a clean cap on the end of the catheter. 17. Use an alcohol pad to clean the skin around the catheter. 18. Allow the skin to air-dry. 19. Put the catheter pad on your skin. Curl the catheter into loops and place it on the pad. Do not place the catheter on your skin. 20. Replace the dressing over the catheter. 21. Discard the drainage bottle as instructed by your health care provider. Do not reuse the drainage bottle. How to change your dressing Change your dressing at least once a week, or more often if needed to keep the dressing dry. Be sure to change the dressing whenever it becomes moist. Your health care provider will tell you how often to change your dressing. 1. Wash your hands with soap and warm water. If soap and water are not available, use hand sanitizer. 2. Gently remove the old dressing. Avoid using scissors to remove the dressing. Sharp objects may damage the catheter. 3. Wash the skin around the insertion site with mild, fragrance-free soap and warm water. Rinse well, then pat the area dry with a clean cloth. 4. Check the skin around the catheter for signs of infection. Check for: ? Skin breakdown. ? Redness, swelling, or pain. ? Fluid or blood. ? Warmth. ? Pus or a bad smell. 5. If your catheter was stitched (sutured) to your skin, look at the suture to make sure it is still anchored in your skin. 6. Do not apply creams, ointments, or alcohol to the area. Let your skin air-dry completely before you apply a new dressing. 7. Curl the catheter into  loops and place it on the sterile catheter pad. Do not place the catheter on your skin. 8. If you do not have a pad, use a clean dressing. Slide the dressing under the disk that holds the drainage catheter in place. 9. Use gauze to cover the catheter and the catheter pad. The catheter should rest on the pad or dressing, not on your skin. 10. Tape the dressing to your skin. You may be instructed to use an adhesive dressing covering instead of gauze and tape. 11. Wash your hands with soap and warm water. If soap and water are not available, use hand sanitizer. General recommendations  Always wash your hands with soap and warm water before and after caring for your catheter and drainage bottle. Use a mild, fragrance-free soap. If soap and water are not available, use hand sanitizer.  Always make sure  there are no leaks in the catheter or drainage bottle.  Each time you drain the catheter, note the color and amount of fluid.  Do not touch the tip of the catheter or the drainage bottle tubing.  Do not reuse drainage bottles.  Do not take baths, swim, or use a hot tub until your health care provider approves. Ask your health care provider if you may take showers. You may only be allowed to take sponge baths.  Take deep breaths regularly, followed by a cough. Doing this can help to prevent lung infection. Contact a health care provider if:  You have any questions about caring for your catheter or drainage bottle.  You still have pain at the catheter insertion site more than 2 days after your procedure.  You have pain while draining your catheter.  Your catheter becomes bent, twisted, or cracked.  The connection between the catheter and the collection bottle becomes loose.  You have any of these around your catheter insertion site or coming from it: ? Skin breakdown. ? Redness, swelling, or pain. ? Fluid or blood. ? Warmth. ? Pus or a bad smell. Get help right away if:  You have a  fever or chills.  You have chest pain.  You have dizziness or shortness of breath.  You have severe redness, swelling, or pain at your catheter insertion site.  The catheter comes out.  The catheter is blocked or clogged. Summary  An indwelling pleural catheter is a thin, flexible tube that is inserted under your skin and into your chest. The catheter drains excess fluid that collects in the area between the chest wall and the lungs (pleural space).  It is important to follow instructions from your health care provider about how to drain and care for your catheter.  Do not touch the tip of the catheter or the drainage bottle tubing.  Always wash your hands with soap and water before and after caring for your catheter and drainage bottle. If soap and water are not available, use hand sanitizer. This information is not intended to replace advice given to you by your health care provider. Make sure you discuss any questions you have with your health care provider. Document Revised: 07/18/2020 Document Reviewed: 05/19/2020 Elsevier Patient Education  2021 [place].   Moderate Conscious Sedation, Adult, Care After This sheet gives you information about how to care for yourself after your procedure. Your health care provider may also give you more specific instructions. If you have problems or questions, contact your health care provider. What can I expect after the procedure? After the procedure, it is common to have:  Sleepiness for several hours.  Impaired judgment for several hours.  Difficulty with balance.  Vomiting if you eat too soon. Follow these instructions at home: For the time period you were told by your health care provider:  Rest.  Do not participate in activities where you could fall or become injured.  Do not drive or use machinery.  Do not drink alcohol.  Do not take sleeping pills or medicines that cause drowsiness.  Do not make important decisions  or sign legal documents.  Do not take care of children on your own.      Eating and drinking  Follow the diet recommended by your health care provider.  Drink enough fluid to keep your urine pale yellow.  If you vomit: ? Drink water, juice, or soup when you can drink without vomiting. ? Make sure you have little  or no nausea before eating solid foods.   General instructions  Take over-the-counter and prescription medicines only as told by your health care provider.  Have a responsible adult stay with you for the time you are told. It is important to have someone help care for you until you are awake and alert.  Do not smoke.  Keep all follow-up visits as told by your health care provider. This is important. Contact a health care provider if:  You are still sleepy or having trouble with balance after 24 hours.  You feel light-headed.  You keep feeling nauseous or you keep vomiting.  You develop a rash.  You have a fever.  You have redness or swelling around the IV site. Get help right away if:  You have trouble breathing.  You have new-onset confusion at home. Summary  After the procedure, it is common to feel sleepy, have impaired judgment, or feel nauseous if you eat too soon.  Rest after you get home. Know the things you should not do after the procedure.  Follow the diet recommended by your health care provider and drink enough fluid to keep your urine pale yellow.  Get help right away if you have trouble breathing or new-onset confusion at home. This information is not intended to replace advice given to you by your health care provider. Make sure you discuss any questions you have with your health care provider. Document Revised: 01/15/2020 Document Reviewed: 08/13/2019 Elsevier Patient Education  2021 [employer].

## 2021-02-10 NOTE — Progress Notes (Signed)
Received call from Corinne with [facility] [phone]) stating pt is scheduled to have [facility] come out to the house on Sunday 02/12/21 to assist with PleurX draining.

## 2021-02-10 NOTE — Procedures (Signed)
Interventional Radiology Procedure Note  Procedure: Placement of left tunneled PleurX catheter  Complications: None  Estimated Blood Loss: < 10 mL  Findings: Left tunneled pleural PleurX catheter placed. 850 mL of bloody fluid removed after catheter placement.  Venetia Night. Kathlene Cote, M.D Pager:  [phone]

## 2021-02-11 DIAGNOSIS — I2699 Other pulmonary embolism without acute cor pulmonale: Secondary | ICD-10-CM | POA: Diagnosis not present

## 2021-02-11 DIAGNOSIS — E119 Type 2 diabetes mellitus without complications: Secondary | ICD-10-CM | POA: Diagnosis not present

## 2021-02-11 DIAGNOSIS — D630 Anemia in neoplastic disease: Secondary | ICD-10-CM | POA: Diagnosis not present

## 2021-02-11 DIAGNOSIS — C779 Secondary and unspecified malignant neoplasm of lymph node, unspecified: Secondary | ICD-10-CM | POA: Diagnosis not present

## 2021-02-11 DIAGNOSIS — J9601 Acute respiratory failure with hypoxia: Secondary | ICD-10-CM | POA: Diagnosis not present

## 2021-02-11 DIAGNOSIS — C7951 Secondary malignant neoplasm of bone: Secondary | ICD-10-CM | POA: Diagnosis not present

## 2021-02-11 DIAGNOSIS — K760 Fatty (change of) liver, not elsewhere classified: Secondary | ICD-10-CM | POA: Diagnosis not present

## 2021-02-11 DIAGNOSIS — C7800 Secondary malignant neoplasm of unspecified lung: Secondary | ICD-10-CM | POA: Diagnosis not present

## 2021-02-11 DIAGNOSIS — I890 Lymphedema, not elsewhere classified: Secondary | ICD-10-CM | POA: Diagnosis not present

## 2021-02-11 DIAGNOSIS — I700 Atherosclerosis of aorta: Secondary | ICD-10-CM | POA: Diagnosis not present

## 2021-02-11 DIAGNOSIS — C50911 Malignant neoplasm of unspecified site of right female breast: Secondary | ICD-10-CM | POA: Diagnosis not present

## 2021-02-11 DIAGNOSIS — E785 Hyperlipidemia, unspecified: Secondary | ICD-10-CM | POA: Diagnosis not present

## 2021-02-11 DIAGNOSIS — G8929 Other chronic pain: Secondary | ICD-10-CM | POA: Diagnosis not present

## 2021-02-11 DIAGNOSIS — Z4801 Encounter for change or removal of surgical wound dressing: Secondary | ICD-10-CM | POA: Diagnosis not present

## 2021-02-11 DIAGNOSIS — M84521D Pathological fracture in neoplastic disease, right humerus, subsequent encounter for fracture with routine healing: Secondary | ICD-10-CM | POA: Diagnosis not present

## 2021-02-11 DIAGNOSIS — I10 Essential (primary) hypertension: Secondary | ICD-10-CM | POA: Diagnosis not present

## 2021-02-11 DIAGNOSIS — Z48813 Encounter for surgical aftercare following surgery on the respiratory system: Secondary | ICD-10-CM | POA: Diagnosis not present

## 2021-02-11 DIAGNOSIS — Z170 Estrogen receptor positive status [ER+]: Secondary | ICD-10-CM | POA: Diagnosis not present

## 2021-02-11 DIAGNOSIS — T451X5D Adverse effect of antineoplastic and immunosuppressive drugs, subsequent encounter: Secondary | ICD-10-CM | POA: Diagnosis not present

## 2021-02-11 DIAGNOSIS — J910 Malignant pleural effusion: Secondary | ICD-10-CM | POA: Diagnosis not present

## 2021-02-11 DIAGNOSIS — G620 Drug-induced polyneuropathy: Secondary | ICD-10-CM | POA: Diagnosis not present

## 2021-02-11 DIAGNOSIS — I959 Hypotension, unspecified: Secondary | ICD-10-CM | POA: Diagnosis not present

## 2021-02-11 DIAGNOSIS — G63 Polyneuropathy in diseases classified elsewhere: Secondary | ICD-10-CM | POA: Diagnosis not present

## 2021-02-11 DIAGNOSIS — I2510 Atherosclerotic heart disease of native coronary artery without angina pectoris: Secondary | ICD-10-CM | POA: Diagnosis not present

## 2021-02-12 ENCOUNTER — Encounter: Payer: Self-pay | Admitting: Family Medicine

## 2021-02-13 ENCOUNTER — Telehealth: Payer: Self-pay | Admitting: *Deleted

## 2021-02-13 MED ORDER — LANTUS SOLOSTAR 100 UNIT/ML ~~LOC~~ SOPN: PEN_INJECTOR | SUBCUTANEOUS | 1 refills | Status: DC

## 2021-02-13 NOTE — Telephone Encounter (Signed)
This RN spoke with pt per her my chart message stating need to do the PT component of her home health thru [facility] as well ( currently [facility] ) the nursing component.  Of note this RN spoke with [facility] on 5/12 for nursing care for PleurX catheter post [facility] stating they were unable to provide nursing component.  This RN placed a call to [facility] and left message for return call from the case manager per above need.  This RN also contacted Dr Rich Fuchs office per above need - he is ordering the home PT- of note per their records they have [facility] as the home agency for PT.  This RN will await call from the casemanager at [facility] for further clarification on coordinating home care.  This RN spoke with pt per above- she is very pleased with [facility] and the care she is getting for the PleurX.

## 2021-02-15 DIAGNOSIS — Z4801 Encounter for change or removal of surgical wound dressing: Secondary | ICD-10-CM | POA: Diagnosis not present

## 2021-02-15 DIAGNOSIS — Z170 Estrogen receptor positive status [ER+]: Secondary | ICD-10-CM | POA: Diagnosis not present

## 2021-02-15 DIAGNOSIS — C7800 Secondary malignant neoplasm of unspecified lung: Secondary | ICD-10-CM | POA: Diagnosis not present

## 2021-02-15 DIAGNOSIS — C7951 Secondary malignant neoplasm of bone: Secondary | ICD-10-CM | POA: Diagnosis not present

## 2021-02-15 DIAGNOSIS — C50911 Malignant neoplasm of unspecified site of right female breast: Secondary | ICD-10-CM | POA: Diagnosis not present

## 2021-02-15 DIAGNOSIS — Z48813 Encounter for surgical aftercare following surgery on the respiratory system: Secondary | ICD-10-CM | POA: Diagnosis not present

## 2021-02-16 DIAGNOSIS — Z170 Estrogen receptor positive status [ER+]: Secondary | ICD-10-CM | POA: Diagnosis not present

## 2021-02-16 DIAGNOSIS — C50911 Malignant neoplasm of unspecified site of right female breast: Secondary | ICD-10-CM | POA: Diagnosis not present

## 2021-02-16 DIAGNOSIS — Z48813 Encounter for surgical aftercare following surgery on the respiratory system: Secondary | ICD-10-CM | POA: Diagnosis not present

## 2021-02-16 DIAGNOSIS — C7800 Secondary malignant neoplasm of unspecified lung: Secondary | ICD-10-CM | POA: Diagnosis not present

## 2021-02-16 DIAGNOSIS — Z4801 Encounter for change or removal of surgical wound dressing: Secondary | ICD-10-CM | POA: Diagnosis not present

## 2021-02-16 DIAGNOSIS — C7951 Secondary malignant neoplasm of bone: Secondary | ICD-10-CM | POA: Diagnosis not present

## 2021-02-22 ENCOUNTER — Other Ambulatory Visit: Payer: Self-pay | Admitting: *Deleted

## 2021-02-22 ENCOUNTER — Encounter: Payer: Self-pay | Admitting: Oncology

## 2021-02-22 DIAGNOSIS — I2699 Other pulmonary embolism without acute cor pulmonale: Secondary | ICD-10-CM

## 2021-02-22 LAB — ACID FAST CULTURE WITH REFLEXED SENSITIVITIES (MYCOBACTERIA): Acid Fast Culture: NEGATIVE

## 2021-02-22 MED ORDER — RIVAROXABAN 20 MG PO TABS: 20.0000 mg | ORAL_TABLET | Freq: Every day | ORAL | 3 refills | Status: DC

## 2021-02-23 DIAGNOSIS — S42294D Other nondisplaced fracture of upper end of right humerus, subsequent encounter for fracture with routine healing: Secondary | ICD-10-CM | POA: Diagnosis not present

## 2021-02-24 DIAGNOSIS — C7800 Secondary malignant neoplasm of unspecified lung: Secondary | ICD-10-CM | POA: Diagnosis not present

## 2021-02-24 DIAGNOSIS — Z4801 Encounter for change or removal of surgical wound dressing: Secondary | ICD-10-CM | POA: Diagnosis not present

## 2021-02-24 DIAGNOSIS — C50911 Malignant neoplasm of unspecified site of right female breast: Secondary | ICD-10-CM | POA: Diagnosis not present

## 2021-02-24 DIAGNOSIS — Z170 Estrogen receptor positive status [ER+]: Secondary | ICD-10-CM | POA: Diagnosis not present

## 2021-02-24 DIAGNOSIS — C7951 Secondary malignant neoplasm of bone: Secondary | ICD-10-CM | POA: Diagnosis not present

## 2021-02-24 DIAGNOSIS — Z48813 Encounter for surgical aftercare following surgery on the respiratory system: Secondary | ICD-10-CM | POA: Diagnosis not present

## 2021-03-01 DIAGNOSIS — C7800 Secondary malignant neoplasm of unspecified lung: Secondary | ICD-10-CM | POA: Diagnosis not present

## 2021-03-01 DIAGNOSIS — Z48813 Encounter for surgical aftercare following surgery on the respiratory system: Secondary | ICD-10-CM | POA: Diagnosis not present

## 2021-03-01 DIAGNOSIS — Z170 Estrogen receptor positive status [ER+]: Secondary | ICD-10-CM | POA: Diagnosis not present

## 2021-03-01 DIAGNOSIS — C7951 Secondary malignant neoplasm of bone: Secondary | ICD-10-CM | POA: Diagnosis not present

## 2021-03-01 DIAGNOSIS — C50911 Malignant neoplasm of unspecified site of right female breast: Secondary | ICD-10-CM | POA: Diagnosis not present

## 2021-03-01 DIAGNOSIS — Z4801 Encounter for change or removal of surgical wound dressing: Secondary | ICD-10-CM | POA: Diagnosis not present

## 2021-03-02 ENCOUNTER — Ambulatory Visit: Payer: MEDICARE

## 2021-03-02 ENCOUNTER — Other Ambulatory Visit: Payer: MEDICARE

## 2021-03-03 ENCOUNTER — Encounter: Payer: Self-pay | Admitting: Oncology

## 2021-03-03 DIAGNOSIS — C7951 Secondary malignant neoplasm of bone: Secondary | ICD-10-CM | POA: Diagnosis not present

## 2021-03-03 DIAGNOSIS — C7800 Secondary malignant neoplasm of unspecified lung: Secondary | ICD-10-CM | POA: Diagnosis not present

## 2021-03-03 DIAGNOSIS — Z4801 Encounter for change or removal of surgical wound dressing: Secondary | ICD-10-CM | POA: Diagnosis not present

## 2021-03-03 DIAGNOSIS — Z48813 Encounter for surgical aftercare following surgery on the respiratory system: Secondary | ICD-10-CM | POA: Diagnosis not present

## 2021-03-03 DIAGNOSIS — C50911 Malignant neoplasm of unspecified site of right female breast: Secondary | ICD-10-CM | POA: Diagnosis not present

## 2021-03-03 DIAGNOSIS — Z170 Estrogen receptor positive status [ER+]: Secondary | ICD-10-CM | POA: Diagnosis not present

## 2021-03-07 ENCOUNTER — Telehealth: Payer: Self-pay | Admitting: Oncology

## 2021-03-07 ENCOUNTER — Telehealth: Payer: Self-pay | Admitting: *Deleted

## 2021-03-07 NOTE — Telephone Encounter (Signed)
Per My chart message pt wanted to inform us of new pharmacy for further piqray refills.  Per chart review noted pt needs additional visit post change in therapy ( IV chemo to oral ) and then delays in pleurX placement.  This RN called pt to discuss above and need for further appointments.  Overall the patient states she is tolerating 1 tablet daily ( note her blood sugars were very elevated on 2 tabs upon initiating therapy.).  She states she had diarrhea as well with start of therapy - up to 2 loose - watery stools which she was able to control with imodium.  She states she now has 1 loose stool daily usually just in the am.  Draining of the PleurX catheter is being managed by home health with pt stating good benefit and no issues.  Per discussion- this RN informed pt of need for lab and injection ideally this week.  Pt verbalized understanding- request sent to scheduling.  This RN will also follow up on when pt needs to have MD visit.

## 2021-03-07 NOTE — Telephone Encounter (Signed)
Scheduled appointment per 06/07 sch msg. Patient is aware.

## 2021-03-08 DIAGNOSIS — Z48813 Encounter for surgical aftercare following surgery on the respiratory system: Secondary | ICD-10-CM | POA: Diagnosis not present

## 2021-03-08 DIAGNOSIS — C7800 Secondary malignant neoplasm of unspecified lung: Secondary | ICD-10-CM | POA: Diagnosis not present

## 2021-03-08 DIAGNOSIS — Z4801 Encounter for change or removal of surgical wound dressing: Secondary | ICD-10-CM | POA: Diagnosis not present

## 2021-03-08 DIAGNOSIS — Z170 Estrogen receptor positive status [ER+]: Secondary | ICD-10-CM | POA: Diagnosis not present

## 2021-03-08 DIAGNOSIS — C7951 Secondary malignant neoplasm of bone: Secondary | ICD-10-CM | POA: Diagnosis not present

## 2021-03-08 DIAGNOSIS — C50911 Malignant neoplasm of unspecified site of right female breast: Secondary | ICD-10-CM | POA: Diagnosis not present

## 2021-03-10 ENCOUNTER — Inpatient Hospital Stay: Payer: MEDICARE | Attending: Oncology

## 2021-03-10 ENCOUNTER — Inpatient Hospital Stay: Payer: MEDICARE

## 2021-03-10 ENCOUNTER — Other Ambulatory Visit: Payer: Self-pay

## 2021-03-10 ENCOUNTER — Telehealth: Payer: Self-pay

## 2021-03-10 VITALS — BP 97/55 | HR 73 | Temp 98.3°F | Resp 18

## 2021-03-10 DIAGNOSIS — Z79899 Other long term (current) drug therapy: Secondary | ICD-10-CM | POA: Diagnosis not present

## 2021-03-10 DIAGNOSIS — C7951 Secondary malignant neoplasm of bone: Secondary | ICD-10-CM | POA: Diagnosis not present

## 2021-03-10 DIAGNOSIS — J90 Pleural effusion, not elsewhere classified: Secondary | ICD-10-CM | POA: Diagnosis not present

## 2021-03-10 DIAGNOSIS — M879 Osteonecrosis, unspecified: Secondary | ICD-10-CM

## 2021-03-10 DIAGNOSIS — Z86711 Personal history of pulmonary embolism: Secondary | ICD-10-CM | POA: Insufficient documentation

## 2021-03-10 DIAGNOSIS — C50911 Malignant neoplasm of unspecified site of right female breast: Secondary | ICD-10-CM | POA: Insufficient documentation

## 2021-03-10 DIAGNOSIS — R911 Solitary pulmonary nodule: Secondary | ICD-10-CM | POA: Diagnosis not present

## 2021-03-10 DIAGNOSIS — C787 Secondary malignant neoplasm of liver and intrahepatic bile duct: Secondary | ICD-10-CM | POA: Insufficient documentation

## 2021-03-10 DIAGNOSIS — Z170 Estrogen receptor positive status [ER+]: Secondary | ICD-10-CM | POA: Diagnosis not present

## 2021-03-10 DIAGNOSIS — K760 Fatty (change of) liver, not elsewhere classified: Secondary | ICD-10-CM | POA: Diagnosis not present

## 2021-03-10 DIAGNOSIS — Z791 Long term (current) use of non-steroidal anti-inflammatories (NSAID): Secondary | ICD-10-CM | POA: Diagnosis not present

## 2021-03-10 DIAGNOSIS — Z7901 Long term (current) use of anticoagulants: Secondary | ICD-10-CM | POA: Insufficient documentation

## 2021-03-10 DIAGNOSIS — E119 Type 2 diabetes mellitus without complications: Secondary | ICD-10-CM | POA: Diagnosis not present

## 2021-03-10 DIAGNOSIS — Z79818 Long term (current) use of other agents affecting estrogen receptors and estrogen levels: Secondary | ICD-10-CM | POA: Diagnosis not present

## 2021-03-10 DIAGNOSIS — C50919 Malignant neoplasm of unspecified site of unspecified female breast: Secondary | ICD-10-CM

## 2021-03-10 DIAGNOSIS — Z794 Long term (current) use of insulin: Secondary | ICD-10-CM | POA: Diagnosis not present

## 2021-03-10 DIAGNOSIS — E785 Hyperlipidemia, unspecified: Secondary | ICD-10-CM | POA: Insufficient documentation

## 2021-03-10 DIAGNOSIS — I10 Essential (primary) hypertension: Secondary | ICD-10-CM | POA: Insufficient documentation

## 2021-03-10 DIAGNOSIS — Z803 Family history of malignant neoplasm of breast: Secondary | ICD-10-CM | POA: Diagnosis not present

## 2021-03-10 DIAGNOSIS — I700 Atherosclerosis of aorta: Secondary | ICD-10-CM | POA: Insufficient documentation

## 2021-03-10 DIAGNOSIS — Z7189 Other specified counseling: Secondary | ICD-10-CM

## 2021-03-10 DIAGNOSIS — Z79811 Long term (current) use of aromatase inhibitors: Secondary | ICD-10-CM | POA: Diagnosis not present

## 2021-03-10 DIAGNOSIS — G629 Polyneuropathy, unspecified: Secondary | ICD-10-CM | POA: Diagnosis not present

## 2021-03-10 DIAGNOSIS — E1165 Type 2 diabetes mellitus with hyperglycemia: Secondary | ICD-10-CM | POA: Insufficient documentation

## 2021-03-10 DIAGNOSIS — C7800 Secondary malignant neoplasm of unspecified lung: Secondary | ICD-10-CM

## 2021-03-10 LAB — COMPREHENSIVE METABOLIC PANEL
ALT: <6 U/L (ref 0–44)
AST: 9 U/L — ABNORMAL LOW (ref 15–41)
Albumin: 2.8 g/dL — ABNORMAL LOW (ref 3.5–5.0)
Alkaline Phosphatase: 127 U/L — ABNORMAL HIGH (ref 38–126)
Anion gap: 11 (ref 5–15)
BUN: 11 mg/dL (ref 8–23)
CO2: 27 mmol/L (ref 22–32)
Calcium: 9.1 mg/dL (ref 8.9–10.3)
Chloride: 94 mmol/L — ABNORMAL LOW (ref 98–111)
Creatinine, Ser: 1.03 mg/dL — ABNORMAL HIGH (ref 0.44–1.00)
GFR, Estimated: >60 mL/min (ref >60)
Glucose, Bld: 437 mg/dL — ABNORMAL HIGH (ref 70–99)
Potassium: 4.4 mmol/L (ref 3.5–5.1)
Sodium: 132 mmol/L — ABNORMAL LOW (ref 135–145)
Total Bilirubin: 0.5 mg/dL (ref 0.3–1.2)
Total Protein: 6.6 g/dL (ref 6.5–8.1)

## 2021-03-10 LAB — CBC WITH DIFFERENTIAL/PLATELET
Abs Immature Granulocytes: 0.1 10*3/uL — ABNORMAL HIGH (ref 0.00–0.07)
Basophils Absolute: 0.1 10*3/uL (ref 0.0–0.1)
Basophils Relative: 1 %
Eosinophils Absolute: 0.1 10*3/uL (ref 0.0–0.5)
Eosinophils Relative: 1 %
HCT: 31 % — ABNORMAL LOW (ref 36.0–46.0)
Hemoglobin: 9.5 g/dL — ABNORMAL LOW (ref 12.0–15.0)
Immature Granulocytes: 2 %
Lymphocytes Relative: 8 %
Lymphs Abs: 0.5 10*3/uL — ABNORMAL LOW (ref 0.7–4.0)
MCH: 27 pg (ref 26.0–34.0)
MCHC: 30.6 g/dL (ref 30.0–36.0)
MCV: 88.1 fL (ref 80.0–100.0)
Monocytes Absolute: 0.6 10*3/uL (ref 0.1–1.0)
Monocytes Relative: 9 %
Neutro Abs: 5.4 10*3/uL (ref 1.7–7.7)
Neutrophils Relative %: 79 %
Platelets: 377 10*3/uL (ref 150–400)
RBC: 3.52 MIL/uL — ABNORMAL LOW (ref 3.87–5.11)
RDW: 16.4 % — ABNORMAL HIGH (ref 11.5–15.5)
WBC: 6.8 10*3/uL (ref 4.0–10.5)
nRBC: 0 % (ref 0.0–0.2)

## 2021-03-10 MED ORDER — FULVESTRANT 250 MG/5ML IM SOLN
500.0000 mg | Freq: Once | INTRAMUSCULAR | Status: AC
  Administered 2021-03-10: 500 mg via INTRAMUSCULAR

## 2021-03-10 MED ORDER — FULVESTRANT 250 MG/5ML IM SOLN
INTRAMUSCULAR | Status: AC
  Filled 2021-03-10: qty 10

## 2021-03-10 NOTE — Patient Instructions (Signed)
Fulvestrant injection What is this medication? FULVESTRANT (ful VES trant) blocks the effects of estrogen. It is used to treatbreast cancer. This medicine may be used for other purposes; ask your health care provider orpharmacist if you have questions. COMMON BRAND NAME(S): FASLODEX What should I tell my care team before I take this medication? They need to know if you have any of these conditions: bleeding disorders liver disease low blood counts, like low white cell, platelet, or red cell counts an unusual or allergic reaction to fulvestrant, other medicines, foods, dyes, or preservatives pregnant or trying to get pregnant breast-feeding How should I use this medication? This medicine is for injection into a muscle. It is usually given by a healthcare professional in a hospital or clinic setting. Talk to your pediatrician regarding the use of this medicine in children.Special care may be needed. Overdosage: If you think you have taken too much of this medicine contact apoison control center or emergency room at once. NOTE: This medicine is only for you. Do not share this medicine with others. What if I miss a dose? It is important not to miss your dose. Call your doctor or health careprofessional if you are unable to keep an appointment. What may interact with this medication? medicines that treat or prevent blood clots like warfarin, enoxaparin, dalteparin, apixaban, dabigatran, and rivaroxaban This list may not describe all possible interactions. Give your health care provider a list of all the medicines, herbs, non-prescription drugs, or dietary supplements you use. Also tell them if you smoke, drink alcohol, or use illegaldrugs. Some items may interact with your medicine. What should I watch for while using this medication? Your condition will be monitored carefully while you are receiving this medicine. You will need important blood work done while you are taking thismedicine. Do not  become pregnant while taking this medicine or for at least 1 year after stopping it. Women of child-bearing potential will need to have a negative pregnancy test before starting this medicine. Women should inform their doctor if they wish to become pregnant or think they might be pregnant. There is a potential for serious side effects to an unborn child. Men should inform their doctors if they wish to father a child. This medicine may lower sperm counts. Talk to your health care professional or pharmacist for more information. Do not breast-feed an infant while taking this medicine or for 1 year after thelast dose. What side effects may I notice from receiving this medication? Side effects that you should report to your doctor or health care professionalas soon as possible: allergic reactions like skin rash, itching or hives, swelling of the face, lips, or tongue feeling faint or lightheaded, falls pain, tingling, numbness, or weakness in the legs signs and symptoms of infection like fever or chills; cough; flu-like symptoms; sore throat vaginal bleeding Side effects that usually do not require medical attention (report to yourdoctor or health care professional if they continue or are bothersome): aches, pains constipation diarrhea headache hot flashes nausea, vomiting pain at site where injected stomach pain This list may not describe all possible side effects. Call your doctor for medical advice about side effects. You may report side effects to FDA at1-800-FDA-1088. Where should I keep my medication? This drug is given in a hospital or clinic and will not be stored at home. NOTE: This sheet is a summary. It may not cover all possible information. If you have questions about this medicine, talk to your doctor, pharmacist, orhealth care provider.  2022 Elsevier/Gold Standard (2017-12-26 11:34:41)

## 2021-03-10 NOTE — Telephone Encounter (Signed)
Called pt regarding blood glucose. Pt denies sx and states she has not been compliant with checking blood glucose at home and she forgot to take her 24 units of lantus last night. Pt states if blood glucose continues to trend up or does not resolve/ becomes symptomatic she will go to ED. This LPN reviewed with pt risks of elevated blood glucose. Pt verbalized understanding and states she will be compliant with insulin and checking blood glucose.

## 2021-03-10 NOTE — Telephone Encounter (Signed)
-----   Message from Gardenia Phlegm, NP sent at 03/10/2021  4:21 PM EDT ----- Please call patient about elevated blood sugar and see if she is symptomatic, also forward to her PCP ----- Message ----- From: Interface, [facility] [place] Sent: 03/10/2021   3:03 PM EDT To: Chauncey Cruel, MD

## 2021-03-11 LAB — CANCER ANTIGEN 27.29: CA 27.29: 109.9 U/mL — ABNORMAL HIGH (ref 0.0–38.6)

## 2021-03-13 DIAGNOSIS — I2510 Atherosclerotic heart disease of native coronary artery without angina pectoris: Secondary | ICD-10-CM | POA: Diagnosis not present

## 2021-03-13 DIAGNOSIS — E119 Type 2 diabetes mellitus without complications: Secondary | ICD-10-CM | POA: Diagnosis not present

## 2021-03-13 DIAGNOSIS — G620 Drug-induced polyneuropathy: Secondary | ICD-10-CM | POA: Diagnosis not present

## 2021-03-13 DIAGNOSIS — I890 Lymphedema, not elsewhere classified: Secondary | ICD-10-CM | POA: Diagnosis not present

## 2021-03-13 DIAGNOSIS — I700 Atherosclerosis of aorta: Secondary | ICD-10-CM | POA: Diagnosis not present

## 2021-03-13 DIAGNOSIS — T451X5D Adverse effect of antineoplastic and immunosuppressive drugs, subsequent encounter: Secondary | ICD-10-CM | POA: Diagnosis not present

## 2021-03-13 DIAGNOSIS — J9601 Acute respiratory failure with hypoxia: Secondary | ICD-10-CM | POA: Diagnosis not present

## 2021-03-13 DIAGNOSIS — C7951 Secondary malignant neoplasm of bone: Secondary | ICD-10-CM | POA: Diagnosis not present

## 2021-03-13 DIAGNOSIS — I2699 Other pulmonary embolism without acute cor pulmonale: Secondary | ICD-10-CM | POA: Diagnosis not present

## 2021-03-13 DIAGNOSIS — I959 Hypotension, unspecified: Secondary | ICD-10-CM | POA: Diagnosis not present

## 2021-03-13 DIAGNOSIS — Z79899 Other long term (current) drug therapy: Secondary | ICD-10-CM | POA: Diagnosis not present

## 2021-03-13 DIAGNOSIS — E789 Disorder of lipoprotein metabolism, unspecified: Secondary | ICD-10-CM | POA: Diagnosis not present

## 2021-03-13 DIAGNOSIS — Z4801 Encounter for change or removal of surgical wound dressing: Secondary | ICD-10-CM | POA: Diagnosis not present

## 2021-03-13 DIAGNOSIS — G63 Polyneuropathy in diseases classified elsewhere: Secondary | ICD-10-CM | POA: Diagnosis not present

## 2021-03-13 DIAGNOSIS — G8929 Other chronic pain: Secondary | ICD-10-CM | POA: Diagnosis not present

## 2021-03-13 DIAGNOSIS — C779 Secondary and unspecified malignant neoplasm of lymph node, unspecified: Secondary | ICD-10-CM | POA: Diagnosis not present

## 2021-03-13 DIAGNOSIS — Z7984 Long term (current) use of oral hypoglycemic drugs: Secondary | ICD-10-CM | POA: Diagnosis not present

## 2021-03-13 DIAGNOSIS — D630 Anemia in neoplastic disease: Secondary | ICD-10-CM | POA: Diagnosis not present

## 2021-03-13 DIAGNOSIS — M84521D Pathological fracture in neoplastic disease, right humerus, subsequent encounter for fracture with routine healing: Secondary | ICD-10-CM | POA: Diagnosis not present

## 2021-03-13 DIAGNOSIS — C50911 Malignant neoplasm of unspecified site of right female breast: Secondary | ICD-10-CM | POA: Diagnosis not present

## 2021-03-13 DIAGNOSIS — Z170 Estrogen receptor positive status [ER+]: Secondary | ICD-10-CM | POA: Diagnosis not present

## 2021-03-13 DIAGNOSIS — E785 Hyperlipidemia, unspecified: Secondary | ICD-10-CM | POA: Diagnosis not present

## 2021-03-13 DIAGNOSIS — K760 Fatty (change of) liver, not elsewhere classified: Secondary | ICD-10-CM | POA: Diagnosis not present

## 2021-03-13 DIAGNOSIS — I10 Essential (primary) hypertension: Secondary | ICD-10-CM | POA: Diagnosis not present

## 2021-03-13 DIAGNOSIS — Z48813 Encounter for surgical aftercare following surgery on the respiratory system: Secondary | ICD-10-CM | POA: Diagnosis not present

## 2021-03-13 DIAGNOSIS — C7800 Secondary malignant neoplasm of unspecified lung: Secondary | ICD-10-CM | POA: Diagnosis not present

## 2021-03-13 DIAGNOSIS — F17210 Nicotine dependence, cigarettes, uncomplicated: Secondary | ICD-10-CM | POA: Diagnosis not present

## 2021-03-13 DIAGNOSIS — J910 Malignant pleural effusion: Secondary | ICD-10-CM | POA: Diagnosis not present

## 2021-03-17 ENCOUNTER — Encounter: Payer: Self-pay | Admitting: Oncology

## 2021-03-17 ENCOUNTER — Other Ambulatory Visit: Payer: Self-pay | Admitting: *Deleted

## 2021-03-17 NOTE — Telephone Encounter (Signed)
This RN spoke with pt per need for refill of Piqray - note pt is only taking 1 tablet = 150 mg daily.  She has had  elevated glucoses and has not been monitoring well and has missed insulin injections.  Per discussion of above and concerns - appointment made for MD follow up ( noted appts canceled with scheduling of PleurX catheter and not rescheduled )   Need for MD evaluation for further refills.  Appointment made by this RN.

## 2021-03-20 ENCOUNTER — Inpatient Hospital Stay: Payer: MEDICARE

## 2021-03-20 ENCOUNTER — Other Ambulatory Visit: Payer: Self-pay

## 2021-03-20 ENCOUNTER — Other Ambulatory Visit: Payer: Self-pay | Admitting: Oncology

## 2021-03-20 DIAGNOSIS — Z79818 Long term (current) use of other agents affecting estrogen receptors and estrogen levels: Secondary | ICD-10-CM | POA: Diagnosis not present

## 2021-03-20 DIAGNOSIS — C50919 Malignant neoplasm of unspecified site of unspecified female breast: Secondary | ICD-10-CM

## 2021-03-20 DIAGNOSIS — C787 Secondary malignant neoplasm of liver and intrahepatic bile duct: Secondary | ICD-10-CM | POA: Diagnosis not present

## 2021-03-20 DIAGNOSIS — Z79811 Long term (current) use of aromatase inhibitors: Secondary | ICD-10-CM | POA: Diagnosis not present

## 2021-03-20 DIAGNOSIS — C7951 Secondary malignant neoplasm of bone: Secondary | ICD-10-CM | POA: Diagnosis not present

## 2021-03-20 DIAGNOSIS — Z7189 Other specified counseling: Secondary | ICD-10-CM

## 2021-03-20 DIAGNOSIS — Z170 Estrogen receptor positive status [ER+]: Secondary | ICD-10-CM | POA: Diagnosis not present

## 2021-03-20 DIAGNOSIS — C50911 Malignant neoplasm of unspecified site of right female breast: Secondary | ICD-10-CM | POA: Diagnosis not present

## 2021-03-20 LAB — CBC WITH DIFFERENTIAL/PLATELET
Abs Immature Granulocytes: 0.23 10*3/uL — ABNORMAL HIGH (ref 0.00–0.07)
Basophils Absolute: 0.1 10*3/uL (ref 0.0–0.1)
Basophils Relative: 1 %
Eosinophils Absolute: 0.1 10*3/uL (ref 0.0–0.5)
Eosinophils Relative: 1 %
HCT: 31.4 % — ABNORMAL LOW (ref 36.0–46.0)
Hemoglobin: 9.6 g/dL — ABNORMAL LOW (ref 12.0–15.0)
Immature Granulocytes: 2 %
Lymphocytes Relative: 7 %
Lymphs Abs: 0.6 10*3/uL — ABNORMAL LOW (ref 0.7–4.0)
MCH: 27 pg (ref 26.0–34.0)
MCHC: 30.6 g/dL (ref 30.0–36.0)
MCV: 88.5 fL (ref 80.0–100.0)
Monocytes Absolute: 0.7 10*3/uL (ref 0.1–1.0)
Monocytes Relative: 8 %
Neutro Abs: 7.8 10*3/uL — ABNORMAL HIGH (ref 1.7–7.7)
Neutrophils Relative %: 81 %
Platelets: 402 10*3/uL — ABNORMAL HIGH (ref 150–400)
RBC: 3.55 MIL/uL — ABNORMAL LOW (ref 3.87–5.11)
RDW: 16.3 % — ABNORMAL HIGH (ref 11.5–15.5)
WBC: 9.5 10*3/uL (ref 4.0–10.5)
nRBC: 0 % (ref 0.0–0.2)

## 2021-03-20 LAB — CMP (CANCER CENTER ONLY)
ALT: 10 U/L (ref 0–44)
AST: 11 U/L — ABNORMAL LOW (ref 15–41)
Albumin: 3.1 g/dL — ABNORMAL LOW (ref 3.5–5.0)
Alkaline Phosphatase: 95 U/L (ref 38–126)
Anion gap: 12 (ref 5–15)
BUN: 16 mg/dL (ref 8–23)
CO2: 27 mmol/L (ref 22–32)
Calcium: 9.2 mg/dL (ref 8.9–10.3)
Chloride: 94 mmol/L — ABNORMAL LOW (ref 98–111)
Creatinine: 0.89 mg/dL (ref 0.44–1.00)
GFR, Estimated: >60 mL/min (ref >60)
Glucose, Bld: 338 mg/dL — ABNORMAL HIGH (ref 70–99)
Potassium: 5.2 mmol/L — ABNORMAL HIGH (ref 3.5–5.1)
Sodium: 133 mmol/L — ABNORMAL LOW (ref 135–145)
Total Bilirubin: 0.8 mg/dL (ref 0.3–1.2)
Total Protein: 6.8 g/dL (ref 6.5–8.1)

## 2021-03-20 MED ORDER — ALPELISIB (300 MG DAILY DOSE) 2 X 150 MG PO TBPK
150.0000 mg | ORAL_TABLET | Freq: Every day | ORAL | 0 refills | Status: DC
Start: 2021-03-20 — End: 2021-03-22

## 2021-03-21 ENCOUNTER — Encounter: Payer: Self-pay | Admitting: Family Medicine

## 2021-03-21 ENCOUNTER — Encounter: Payer: Self-pay | Admitting: Oncology

## 2021-03-21 DIAGNOSIS — E119 Type 2 diabetes mellitus without complications: Secondary | ICD-10-CM | POA: Diagnosis not present

## 2021-03-21 DIAGNOSIS — Z170 Estrogen receptor positive status [ER+]: Secondary | ICD-10-CM | POA: Diagnosis not present

## 2021-03-21 DIAGNOSIS — E1169 Type 2 diabetes mellitus with other specified complication: Secondary | ICD-10-CM

## 2021-03-21 DIAGNOSIS — Z48813 Encounter for surgical aftercare following surgery on the respiratory system: Secondary | ICD-10-CM | POA: Diagnosis not present

## 2021-03-21 DIAGNOSIS — I10 Essential (primary) hypertension: Secondary | ICD-10-CM | POA: Diagnosis not present

## 2021-03-21 DIAGNOSIS — C7951 Secondary malignant neoplasm of bone: Secondary | ICD-10-CM | POA: Diagnosis not present

## 2021-03-21 DIAGNOSIS — C50911 Malignant neoplasm of unspecified site of right female breast: Secondary | ICD-10-CM | POA: Diagnosis not present

## 2021-03-21 DIAGNOSIS — G629 Polyneuropathy, unspecified: Secondary | ICD-10-CM | POA: Diagnosis not present

## 2021-03-21 DIAGNOSIS — C7800 Secondary malignant neoplasm of unspecified lung: Secondary | ICD-10-CM | POA: Diagnosis not present

## 2021-03-21 DIAGNOSIS — E782 Mixed hyperlipidemia: Secondary | ICD-10-CM | POA: Diagnosis not present

## 2021-03-21 DIAGNOSIS — Z4801 Encounter for change or removal of surgical wound dressing: Secondary | ICD-10-CM | POA: Diagnosis not present

## 2021-03-21 LAB — HEMOGLOBIN A1C
Hgb A1c MFr Bld: 9.7 % — ABNORMAL HIGH (ref 4.8–5.6)
Mean Plasma Glucose: 232 mg/dL

## 2021-03-21 MED ORDER — GLUCOSE BLOOD VI STRP: ORAL_STRIP | 12 refills | Status: AC

## 2021-03-21 NOTE — Progress Notes (Signed)
[place]  [phone] [phone]) [phone]    ID: [name] DOB: Nov 25, 1955  MR#: [id number]  WIO#:[id number]  Patient Care Team: Ronnald Nian, DO as PCP - General (Family Medicine) Marieme Mcmackin, Virgie Dad, MD as Consulting Physician (Oncology) [name], [place] PA   CHIEF COMPLAINT: Estrogen receptor positive stage IV breast cancer (s/p right mastectomy)  CURRENT TREATMENT: Alpelisib, Faslodex, rivaroxaban   INTERVAL HISTORY: Mia [name] returns today for follow-up and treatment of her estrogen receptor positive stage IV breast cancer.  She is accompanied by [name]  She was prescribed alpelisib and started this 01/27/2021.  She agreed to have difficulty checking her blood sugars regularly and the results was poorly controlled diabetes and a significant rise in her hemoglobin A1c.    She was also started on fulvestrant 5/12/202She has tolerated that well.2.   Of note, she also underwent pleural catheter placement on 02/10/2021.  She did not drain fluid from the left lung for 2 and half weeks because she says her husband was not quite sure how to do it.  The health care nurse did come by the house yesterday and they drained 500 cc   REVIEW OF SYSTEMS: Mia [name] uses oxygen 24/7 at home.  She says when she does not use the oxygen the saturation drops to about 92% at rest.  She is still Jordan her right shoulder area, from her surgery in April, but the pain is much less and currently she is off oxycodone and OxyContin.  She takes Percocet in the morning and usually that lasts her the whole day.  She is not constipated from this in fact she has had a little bit of diarrhea because of the [place].  She also had nausea from that medication.  She did not notice any benefit from draining the 500 cc from the left lung yesterday as far as her breathing is concerned.  She has lost some weight she says because of the very strict diabetic  diet she and her husband are currently on.  A detailed review of systems today was otherwise stable   COVID 19 VACCINATION STATUS: [place] x2, with booster 08/16/2020   HISTORY OF CURRENT ILLNESS: From the original intake note:  We have reviewed the medical records from [place], which is the source of the information below:  Mia [name] was initially diagnosed with Stage IIIA (T2N2M0) invasive ductal carcinoma, estrogen receptor positive and HER2 negative right breast cancer in 2000. She underwent right mastectomy, with 4 positive metastatic lymph nodes. She had chemotherapy with taxol and cytoxan and fulvestrant in the past. She had radiation and completed 5 years of tamoxifen.   She was diagnosed with Stage IV disease 04/18/2005 with metastases to the bone, lung, and additional nodes. She was on capecitabine but was discontinued after rising CA 27-29 and scans.   She started anastrozole and everolimus with Delton See in April 2014. She tolerated this well, but this was discontinued in July 2014 due to abnormal blood tests, hyperlipidemia and hyperglycemia.  She began Abraxane 140m /m2 and Xgeva in August 2014 weekly x3 with 1 week off. She tolerated this treatment well. She discontinued Xgeva January 2016 due to osteonecrosis of the jaw and mouth issues. She continued abraxane alone until her last dose on 11/18/2015 due to neuropathy and disease progression with restaging studies on 11/23/2015 with a CT chest abdomen and pelvis and one scan showing skull, sternal and femoral lesions.   She was switched to  gemcitabine starting 01/20/2016 weekly x3 and 1 week off. This was discontinued on 06/15/2016 due to a port related jugular clot on the right side. She was given Lovenox for 3 months.  She was switched to Letrozole 2.5 mg and palbociclib 125 mg starting 07/13/2016. She tolerated this well. Restaging studies with CT chest abdomen and pelvis and bone scan on 07/01/2017 showed stable/  improving lesions. CA 27-29 was stable.  During the last few months of follow up in [place], she completed restaging studies with a CT chest abdomen and pelvis at [facility] on 12/17/2017 showing: A 6 mm pulmonary nodule in the left upper lobe laterally. Progression of metastatic disease to the bones at the L4 and L5 vertebral bodies. Sclerotic metastases at T8, T10, an T11, are similar to previous exams. Sclerotic metastases in the sternum stable. Hepatic steatosis. Aortic atherosclerosis.  Most recent CA-64-29 (January 2019) was 58.  Most recent hemoglobin A1c was 7.1 according to the patient  The patient's subsequent history is as detailed below.   PAST MEDICAL HISTORY: Past Medical History:  Diagnosis Date   Breast cancer metastasized to bone [facility])    Diabetes mellitus without complication ([place])    Dyspnea    History of radiation therapy 11/18/2018-12/02/2018   lumbar spine and pelvis   Dr [name]   History of radiation therapy 11/08/2020-11/18/2020   right arm   Dr [name]   Hyperlipidemia    Hypertension    Lymphedema of right arm    Neuropathy associated with cancer ([place])    Obesity (BMI 35.0-39.9 without comorbidity)    PONV (postoperative nausea and vomiting)    Pulmonary embolism ([place]) 12/2020     PAST SURGICAL HISTORY: Past Surgical History:  Procedure Laterality Date   CATARACT EXTRACTION Left    HUMERUS IM NAIL Right 01/11/2021   Procedure: INTRAMEDULLARY (IM) NAIL HUMERAL;  Surgeon: Hiram Gash, MD;  Location: [facility];  Service: Orthopedics;  Laterality: Right;   HYSTERECTOMY ABDOMINAL WITH SALPINGO-OOPHORECTOMY     IR PERC PLEURAL DRAIN W/INDWELL CATH W/IMG GUIDE  02/10/2021   MODIFIED RADICAL MASTECTOMY Right      FAMILY HISTORY: Family History  Problem Relation Age of Onset   Diabetes Mother    Breast cancer Mother 76   Cerebral aneurysm Mother    Pulmonary embolism Father    Colon cancer Father 54   Breast cancer Sister 23       noninvasive   Breast  cancer Paternal Grandmother 45   The patient reports she had negative genetic testing at [facility] 2014, report not available. --The patient's father died at age 82 due to a PE, and he also had a history of colon cancer diagnosed at age 67. The patient's mother died at age 52 due to diabetes and survived a cerebral aneurysm. The patient's mother also had a history of breast cancer diagnosed at age 36.  The patient has no brothers and 1 sister. The patient's sister was diagnosed with non invasive breast cancer at age 60. There was a paternal grandmother diagnosed with breast cancer at age 20. The patient denies a family history of ovarian cancer.    GYNECOLOGIC HISTORY:  Patient's last menstrual period was 10/01/2006. Menarche: 65 years old Age at first live birth: 65 years old She is [place] P2.  She is status post hysterectomy with bilateral salpingo- oophorectomy 12/23/2006 with benign pathology ([id number]) She never used HRT.    SOCIAL HISTORY:  Mia [name] is disabled due to her  breast cancer. She used to be Surveyor, quantity for a cardiology office. Her husband, Mia [name] is in the process of retiring. He is a Electrical engineer for a power company. The patient's daughter, [name], lives in [place] and works as a Teaching laboratory technician. The patient's second daughter, [name] is a stay at home mom and is a special needs teacher, who will soon move to [facility]). The patient plans on attending [employer].     ADVANCED DIRECTIVES: In the absence of any documentation to the contrary, the patient's spouse is their HCPOA.    HEALTH MAINTENANCE: Social History   Tobacco Use   Smoking status: Never   Smokeless tobacco: Never  Vaping Use   Vaping Use: Never used  Substance Use Topics   Alcohol use: Not Currently   Drug use: Never    Colonoscopy: 2017   PAP:  Bone density: never  Mammogram: 2017   Allergies  Allergen Reactions   Morphine And Related  Anaphylaxis    Current Outpatient Medications  Medication Sig Dispense Refill   glucose blood ([employer] GLUCOSE METER TEST STRIPS) test strip Use as instructed 100 each 12   Lancets (FREESTYLE) lancets Use as instructed 100 each 12   oxyCODONE-acetaminophen (PERCOCET) 10-325 MG tablet Take 1 tablet by mouth 2 (two) times daily as needed for pain. 30 tablet 0   amLODipine (NORVASC) 10 MG tablet Take 10 mg by mouth daily.     blood glucose meter kit and supplies KIT Dispense based on patient and insurance preference. Use up to four times daily as directed. 1 each 0   fenofibrate (TRICOR) 145 MG tablet TAKE 1 TABLET BY MOUTH EVERY DAY (Patient taking differently: Take 145 mg by mouth daily.) 90 tablet 2   fluticasone (FLONASE) 50 MCG/ACT nasal spray SPRAY 2 SPRAYS INTO EACH NOSTRIL EVERY DAY (Patient taking differently: Place 1 spray into both nostrils daily.) 48 mL 1   gabapentin (NEURONTIN) 300 MG capsule Take 1 capsule (300 mg total) by mouth 4 (four) times daily. Pt is only taking 2 capsules a day (Patient taking differently: Take 300 mg by mouth 2 (two) times daily.) 180 capsule 0   glucose blood test strip E11.69 Use as instructed 100 each 12   ibuprofen (ADVIL) 800 MG tablet Take 800 mg by mouth every 8 (eight) hours as needed for moderate pain. 30 tablet 0   Insulin Admin Supplies MISC Inject 24 Units into the skin daily.     insulin glargine (LANTUS SOLOSTAR) 100 UNIT/ML Solostar Pen INJECT 24 UNITS INTO THE SKIN DAILY 15 mL 1   Insulin Pen Needle 31G X 5 MM MISC UAD for Sq inj for DM qd 100 each PRN   lidocaine-prilocaine (EMLA) cream Apply to affected area once 30 g 3   losartan (COZAAR) 50 MG tablet Take 1 tablet (50 mg total) by mouth 2 (two) times daily. 180 tablet 3   metoprolol succinate (TOPROL-XL) 25 MG 24 hr tablet TAKE 1 TABLET BY MOUTH EVERY DAY (Patient taking differently: Take 25 mg by mouth daily.) 90 tablet 1   ondansetron (ZOFRAN) 8 MG tablet Take 1 tablet (8 mg total) by mouth  2 (two) times daily as needed (Nausea or vomiting). 30 tablet 1   pantoprazole (PROTONIX) 40 MG tablet TAKE 1 TABLET BY MOUTH EVERY DAY (Patient taking differently: Take 40 mg by mouth daily.) 90 tablet 0   pioglitazone (ACTOS) 15 MG tablet TAKE 1 TABLET BY MOUTH EVERY DAY (Patient taking differently:  Take 15 mg by mouth daily.) 90 tablet 1   pravastatin (PRAVACHOL) 20 MG tablet TAKE 1 TABLET BY MOUTH EVERY DAY (Patient taking differently: Take 20 mg by mouth daily.) 90 tablet 2   rivaroxaban (XARELTO) 20 MG TABS tablet Take 1 tablet (20 mg total) by mouth daily with supper. 30 tablet 3   sitaGLIPtin-metformin (JANUMET) 50-1000 MG tablet Take 1 tablet by mouth daily with breakfast. 90 tablet 1   No current facility-administered medications for this visit.    OBJECTIVE: White woman examined in a wheelchair Vitals:   03/22/21 1050  BP: (!) 116/58  [name]: (!) 109  Resp: 18  Temp: (!) 97.5 F (36.4 C)  SpO2: 99%   Wt Readings from Last 3 Encounters:  03/22/21 190 lb 9.6 oz (86.5 kg)  01/11/21 218 lb 14.7 oz (99.3 kg)  01/05/21 219 lb (99.3 kg)   Body mass index is 32.72 kg/m.   ECOG FS:2 - Symptomatic, <50% confined to bed  Sclerae unicteric, EOMs intact Wearing a mask No cervical or supraclavicular adenopathy Lungs no rales or rhonchi; no wheezes Heart regular rate and rhythm Abd soft, nontender, no masses palpated MSK no focal spinal tenderness, no upper extremity lymphedema Neuro: nonfocal, well oriented, appropriate affect Breasts: Deferred  LAB RESULTS:  CMP   No results found for: TOTALPROTELP, ALBUMINELP, A1GS, A2GS, BETS, BETA2SER, GAMS, MSPIKE, SPEI  No results found for: KPAFRELGTCHN, LAMBDASER, KAPLAMBRATIO  Lab Results  Component Value Date   WBC 9.5 03/20/2021   NEUTROABS 7.8 (H) 03/20/2021   HGB 9.6 (L) 03/20/2021   HCT 31.4 (L) 03/20/2021   MCV 88.5 03/20/2021   PLT 402 (H) 03/20/2021    No results found for: LABCA2  No components found for:  [id number]  No results for input(s): INR in the last 168 hours.  No results found for: LABCA2  No results found for: QIH474  No results found for: QVZ563  No results found for: OVF643  Lab Results  Component Value Date   CA2729 109.9 (H) 03/10/2021    No components found for: HGQUANT  No results found for: CEA1 / No results found for: CEA1   No results found for: AFPTUMOR  No results found for: CHROMOGRNA  No results found for: HGBA, HGBA2QUANT, HGBFQUANT, HGBSQUAN (Hemoglobinopathy evaluation)   No results found for: LDH  No results found for: IRON, TIBC, IRONPCTSAT (Iron and TIBC)  No results found for: FERRITIN  Urinalysis    Component Value Date/Time   COLORURINE YELLOW 01/05/2021 2200   APPEARANCEUR CLEAR 01/05/2021 2200   LABSPEC 1.009 01/05/2021 2200   PHURINE 5.0 01/05/2021 2200   GLUCOSEU NEGATIVE 01/05/2021 2200   HGBUR NEGATIVE 01/05/2021 2200   BILIRUBINUR NEGATIVE 01/05/2021 2200   KETONESUR NEGATIVE 01/05/2021 2200   PROTEINUR NEGATIVE 01/05/2021 2200   NITRITE NEGATIVE 01/05/2021 2200   LEUKOCYTESUR NEGATIVE 01/05/2021 2200    STUDIES: No results found.   Refuses mammography at this point (12/23/2019)   ELIGIBLE FOR AVAILABLE RESEARCH PROTOCOL: no   ASSESSMENT: 65 y.o. [place], [place] woman with stage IV breast cancer, as follows:  (1) status post right mastectomy in 2000, for a pT2 pN2, stage IIIA invasive ductal carcinoma, estrogen receptor positive, progesterone receptor not tested, HER-2 negative (0) by immunohistochemistry  (a) adjuvant chemotherapy with doxorubicin and cyclophosphamide in dose dense fashion x4 followed by paclitaxel in dose dense fashion x4  (b) adjuvant radiation: 30 doses  (c) antiestrogens: Tamoxifen for 5 years, completed 2005  (2) METASTASTIC DISEASE: 2008, involving bones,  lungs, and lymph nodes  (a) CA 27-29 is informative  (b) CT of the chest abdomen and pelvis in [facility] finds stable  sclerotic metastases (T8, T10, T11, sternum, L4, L5); 0.6 cm left upper lobe lung nodule stable  (3)  prior anti-estrogen treatments:  (a) fulvestrant--progression  (b) exemestane/everolimus--hyperlipidemia, hyperglycemia  (4) prior chemotherapy treatments:  (a) capecitabine: progression  (b) Abraxane, August 2014 through 11/18/2015: good response but stopped due to neuropathy  (c) gemcitabine 01/20/2016--?; multiple interruptions secondary to infections  (5) radiation therapy:  (a) T spine and Right femur, completed 01/10/2016  (6) letrozole/ palbociclib started 07/13/2016, discontinued 10/26/2020 with evidence of progression  (7) bone treatment:  (a) denosumab/Xgeva--discontinued January 2016 due to osteonecrosis of the jaw  (8) cancer associated pain: Updated 12/01/2020 (a) OxyIR 5 mg QID PRN  (b) PMP Aware reviewed 12/01/2020  (c) OxyContin 20 mg p.o. 3 times daily  (d) bowel prophylaxis: MiraLAX as needed  (9) restaging studies:1  (a) mostly stable bone lesions with evidence of progression at L3-L5; no epidural tumor by total spinal MRI in November 2019  (b) no evidence of progressive lung, lymph node or liver lesions by CT scans of the chest abdomen and pelvis and bone scan December 2019  (c) CT scans abd/pelvis 03/31/2019 shows stable bone metastases, no extraosseous disease  (d) MRI pelvis 04/09/2019 shows bilateral ilium, left acetabulum and lumbar mets, stable  (e) CT abd/pelvis 09/01/2019 shows stable bone mets, slight increase left effusion  (f) CT of the chest 05/05/2020 again shows minimal increase in the left pleural effusion, possible resorption of bone around the left glenoid lesion, otherwise stable  (g) CT of the chest abdomen and pelvis 10/18/2020 again shows no evidence of visceral disease, but there is apparent progression in the bone disease, and some enlargement of the left pleural effusion.  The CA 27-29 has also been rising.  Accordingly her letrozole and  palbociclib are being discontinued  (10) palliative radiation 11/18/2018 through 12/02/2018 Site/dose:   1. Lumbar spine; 35 Gy in 14 fractions of 2.5 Gy                      2. Pelvis; 35 Gy in 14 fractions of 2.5 Gy  (11) started cyclophosphamide, methotrexate and fluorouracil [CMF] 11/10/2020  (a) methotrexate held first dose, during palliative radiation  (B) discontinued after 3 cycles (last dose 12/22/2020)  (C) CT chest 01/23/2021 showed worsening left effusion, cytologically positive  (12) status post left thoracentesis 11/21/2020, with positive cytology  (a) tumor cells are strongly estrogen and progesterone receptor positive, HER-2 negative  (b) foundation 1 requested on the thoracentesis sample shows a PI K3 CA mutation.  Microsatellite status is stable and the tumor mutation burden is low.  There are mutations in ESR 1, GAT A3, and [place] 6  (C) left pleural drain placed 02/10/2021  (13) status post Right humeral shaft intramedullary nail fixation Right proximal humeral nonunion treatment 01/12/2021 [facility])  (14) alpelisib started January 27, 2021, fulvestrant started 01/26/2021  (a) alpelisib and fulvestrant discontinued June 2022 with HbAic of 9.7  (15) to start Doxil 04/11/2021, to be repeated every 21 days   PLAN: Mia [name] was not able to tolerate the palbociclib.  She went on a strict diet but was not able to follow her sugar closely and the result was very uncontrolled diabetes with a significant rise in her hemoglobin A1c.  She is continuing the diet, has lost quite a bit of weight as a  result, and I am hopeful the A1c will drop now that she is going off the medication.  She is also going off the Faslodex  At this point we have pretty much exhausted the antiestrogen options.  We could start recycling through them.  On the plus side though she has mostly bone disease which is currently well controlled not causing her a great deal of pain.  She does have a significant left  pleural effusion which continues.  She has a drain in place.  I have suggested she drain the effusion at least on a once a week basis and that we are holding the drainage to half a liter because she developed pain previously when draining more than.  We discussed various options including the combination of carboplatinum and Doxil.  I think she would be able to tolerate that.  The goal of treatment would be resolution or significant improvement in the left effusion and of course continuing control of the bone lesions.  After much discussion she really does not think she wants aggressive therapy at this point.  We discussed Doxil alone.  She has a good understanding of the possible toxicities side effects and complications of this agent and she will need an echo and a port.  I have entered those orders.  I am hopeful she will be able to tolerate this well and the plan would be to go through at least 4 cycles and then restage  I am delighted that she is in much less pain at present.  She would like to stay on the Percocet once or twice a day as needed and I was glad to fill a prescription for her.  She will start the Doxil 04/11/2021 and I will see her that same day.  Total encounter time 45 minutes.Sarajane Jews C. Geoge Lawrance, MD 03/22/21 2:04 PM Medical Oncology and Hematology [facility] [place],  [address] Tel. [phone]    Fax. [phone]   I, Wilburn Mylar, am acting as scribe for Dr. Virgie Dad. Marcelline Temkin.  I, Lurline Del MD, have reviewed the above documentation for accuracy and completeness, and I agree with the above.   *Total Encounter Time as defined by the Centers for Medicare and Medicaid Services includes, in addition to the face-to-face time of a patient visit (documented in the note above) non-face-to-face time: obtaining and reviewing outside history, ordering and reviewing medications, tests or procedures, care coordination  (communications with other health care professionals or caregivers) and documentation in the medical record.

## 2021-03-22 ENCOUNTER — Inpatient Hospital Stay (HOSPITAL_BASED_OUTPATIENT_CLINIC_OR_DEPARTMENT_OTHER): Payer: MEDICARE | Admitting: Oncology

## 2021-03-22 ENCOUNTER — Ambulatory Visit: Payer: MEDICARE | Admitting: Oncology

## 2021-03-22 ENCOUNTER — Other Ambulatory Visit: Payer: Self-pay

## 2021-03-22 VITALS — BP 116/58 | HR 109 | Temp 97.5°F | Resp 18 | Ht 64.0 in | Wt 190.6 lb

## 2021-03-22 DIAGNOSIS — C7800 Secondary malignant neoplasm of unspecified lung: Secondary | ICD-10-CM

## 2021-03-22 DIAGNOSIS — Z7189 Other specified counseling: Secondary | ICD-10-CM

## 2021-03-22 DIAGNOSIS — Z79818 Long term (current) use of other agents affecting estrogen receptors and estrogen levels: Secondary | ICD-10-CM | POA: Diagnosis not present

## 2021-03-22 DIAGNOSIS — C50919 Malignant neoplasm of unspecified site of unspecified female breast: Secondary | ICD-10-CM

## 2021-03-22 DIAGNOSIS — C7951 Secondary malignant neoplasm of bone: Secondary | ICD-10-CM

## 2021-03-22 DIAGNOSIS — Z170 Estrogen receptor positive status [ER+]: Secondary | ICD-10-CM | POA: Diagnosis not present

## 2021-03-22 DIAGNOSIS — Z79811 Long term (current) use of aromatase inhibitors: Secondary | ICD-10-CM | POA: Diagnosis not present

## 2021-03-22 DIAGNOSIS — C50911 Malignant neoplasm of unspecified site of right female breast: Secondary | ICD-10-CM | POA: Diagnosis not present

## 2021-03-22 DIAGNOSIS — C787 Secondary malignant neoplasm of liver and intrahepatic bile duct: Secondary | ICD-10-CM | POA: Diagnosis not present

## 2021-03-22 MED ORDER — ONDANSETRON HCL 8 MG PO TABS: 8.0000 mg | ORAL_TABLET | Freq: Two times a day (BID) | ORAL | 1 refills | Status: DC | PRN

## 2021-03-22 MED ORDER — OXYCODONE-ACETAMINOPHEN 10-325 MG PO TABS: 1.0000 | ORAL_TABLET | Freq: Two times a day (BID) | ORAL | 0 refills | Status: DC | PRN

## 2021-03-22 MED ORDER — LIDOCAINE-PRILOCAINE 2.5-2.5 % EX CREA: TOPICAL_CREAM | CUTANEOUS | 3 refills | Status: DC

## 2021-03-22 NOTE — Progress Notes (Signed)
DISCONTINUE OFF PATHWAY REGIMEN - Breast   [id number]:Alpelisib 300 mg PO Daily D1-28 + Fulvestrant 500 mg IM D1,15/D1 q28 Days:   Cycle 1: A cycle is 28 days:     Alpelisib      Fulvestrant    Cycles 2 and beyond: A cycle is every 28 days:     Alpelisib      Fulvestrant   **Always confirm dose/schedule in your pharmacy ordering system**  REASON: Toxicities / Adverse Event PRIOR TREATMENT: Off Pathway: Alpelisib 300 mg PO Daily D1-28 + Fulvestrant 500 mg IM D1,15/D1 q28 Days TREATMENT RESPONSE: Unable to Evaluate  START ON PATHWAY REGIMEN - Breast     A cycle is every 28 days:     Liposomal doxorubicin   **Always confirm dose/schedule in your pharmacy ordering system**  Patient Characteristics: Distant Metastases or Locoregional Recurrent Disease - Unresected or Locally Advanced Unresectable Disease Progressing after Neoadjuvant and Local Therapies, HER2 Negative/Unknown/Equivocal, ER Positive, Chemotherapy, Third Line and Beyond, Anthracycline  Candidate Therapeutic Status: Distant Metastases ER Status: Positive (+) HER2 Status: Negative (-) PR Status: Positive (+) Therapy Approach Indicated: Standard Chemotherapy/Endocrine Therapy Line of Therapy: Third Line and Beyond Intent of Therapy: Non-Curative / Palliative Intent, Discussed with Patient

## 2021-03-23 ENCOUNTER — Encounter: Payer: Self-pay | Admitting: Oncology

## 2021-03-23 ENCOUNTER — Telehealth: Payer: Self-pay

## 2021-03-23 NOTE — Progress Notes (Signed)
Prior Authorization for EMLA Cream sent for review. Awaiting response

## 2021-03-23 NOTE — Telephone Encounter (Signed)
Prior Authorization for EMLA Cream approved. Patient notified.

## 2021-03-24 ENCOUNTER — Encounter: Payer: Self-pay | Admitting: Oncology

## 2021-03-28 ENCOUNTER — Other Ambulatory Visit: Payer: Self-pay | Admitting: Radiology

## 2021-03-28 ENCOUNTER — Ambulatory Visit (HOSPITAL_COMMUNITY): Payer: MEDICARE | Attending: Internal Medicine

## 2021-03-28 ENCOUNTER — Other Ambulatory Visit: Payer: Self-pay

## 2021-03-28 DIAGNOSIS — C7951 Secondary malignant neoplasm of bone: Secondary | ICD-10-CM | POA: Diagnosis not present

## 2021-03-28 DIAGNOSIS — Z0189 Encounter for other specified special examinations: Secondary | ICD-10-CM

## 2021-03-28 DIAGNOSIS — C50919 Malignant neoplasm of unspecified site of unspecified female breast: Secondary | ICD-10-CM | POA: Diagnosis not present

## 2021-03-28 DIAGNOSIS — Z170 Estrogen receptor positive status [ER+]: Secondary | ICD-10-CM | POA: Diagnosis not present

## 2021-03-28 DIAGNOSIS — C50911 Malignant neoplasm of unspecified site of right female breast: Secondary | ICD-10-CM | POA: Diagnosis not present

## 2021-03-28 DIAGNOSIS — C7800 Secondary malignant neoplasm of unspecified lung: Secondary | ICD-10-CM | POA: Diagnosis not present

## 2021-03-28 DIAGNOSIS — Z4801 Encounter for change or removal of surgical wound dressing: Secondary | ICD-10-CM | POA: Diagnosis not present

## 2021-03-28 DIAGNOSIS — Z48813 Encounter for surgical aftercare following surgery on the respiratory system: Secondary | ICD-10-CM | POA: Diagnosis not present

## 2021-03-28 LAB — ECHOCARDIOGRAM COMPLETE
Area-P 1/2: 3.34 cm2
S' Lateral: 3.1 cm

## 2021-03-29 ENCOUNTER — Encounter: Payer: Self-pay | Admitting: Oncology

## 2021-03-29 NOTE — H&P (Signed)
Chief Complaint: Patient was seen in consultation today for breast cancer/Port-a-cath placement.  Referring Physician(s): Magrinat, Virgie Dad (oncology)  Supervising Physician: Markus Daft  Patient Status: [facility] - Out-pt  History of Present Illness: Mia [name] is a 65 y.o. female with a past medical history of hypertension, hyperlipidemia, PE 12/2020, breast cancer, diabetes mellitus, and obesity. She was unfortunately diagnosed with breast cancer (estrogen positive) in 2000 (progression to stage IV in 2006). At that time, patient underwent a right mastectomy and multiple rounds systemic chemotherapy/radiation therapy as management (of note, patient has had 2 Port-a-caths in the past, one placed on right and left; did have history of clot in left Port-a-cath approximately 5 years ago and had removed). Her cancer is currently managed by Dr. [name]. She is currently undergoing systemic chemotherapy as management, and is in need of durable IV access for administration.  IR consulted by Dr. [name] for possible image-guided Port-a-cath placement. Patient awake and alert laying in bed with no complaints at this time. Denies fever, chills, chest pain, dyspnea, abdominal pain, or headache.   Past Medical History:  Diagnosis Date   Breast cancer metastasized to bone [facility])    Diabetes mellitus without complication ([place])    Dyspnea    History of radiation therapy 11/18/2018-12/02/2018   lumbar spine and pelvis   Dr [name]   History of radiation therapy 11/08/2020-11/18/2020   right arm   Dr [name]   Hyperlipidemia    Hypertension    Lymphedema of right arm    Neuropathy associated with cancer ([place])    Obesity (BMI 35.0-39.9 without comorbidity)    PONV (postoperative nausea and vomiting)    Pulmonary embolism ([place]) 12/2020    Past Surgical History:  Procedure Laterality Date   CATARACT EXTRACTION Left    HUMERUS IM NAIL Right 01/11/2021   Procedure: INTRAMEDULLARY (IM)  NAIL HUMERAL;  Surgeon: Hiram Gash, MD;  Location: [facility] ORS;  Service: Orthopedics;  Laterality: Right;   HYSTERECTOMY ABDOMINAL WITH SALPINGO-OOPHORECTOMY     IR PERC PLEURAL DRAIN W/INDWELL CATH W/IMG GUIDE  02/10/2021   MODIFIED RADICAL MASTECTOMY Right     Allergies: Morphine and related  Medications: Prior to Admission medications   Medication Sig Start Date End Date Taking? Authorizing Provider  glucose blood ([employer] GLUCOSE METER TEST STRIPS) test strip Use as instructed 02/06/21   Ronnald Nian, DO  Lancets (FREESTYLE) lancets Use as instructed 02/06/21   Cirigliano, Mary K, DO  amLODipine (NORVASC) 10 MG tablet Take 10 mg by mouth daily. 09/22/20   [provider]  blood glucose meter kit and supplies KIT Dispense based on patient and insurance preference. Use up to four times daily as directed. 01/31/21   Magrinat, Virgie Dad, MD  fenofibrate (TRICOR) 145 MG tablet TAKE 1 TABLET BY MOUTH EVERY DAY Patient taking differently: Take 145 mg by mouth daily. 12/21/20   Cirigliano, Mary K, DO  fluticasone (FLONASE) 50 MCG/ACT nasal spray SPRAY 2 SPRAYS INTO EACH NOSTRIL EVERY DAY Patient taking differently: Place 1 spray into both nostrils daily. 10/03/20   Cirigliano, Garvin Fila, DO  gabapentin (NEURONTIN) 300 MG capsule Take 1 capsule (300 mg total) by mouth 4 (four) times daily. Pt is only taking 2 capsules a day Patient taking differently: Take 300 mg by mouth 2 (two) times daily. 12/28/20   Magrinat, Virgie Dad, MD  glucose blood test strip E11.69 Use as instructed 03/21/21   Letta Median K, DO  ibuprofen (ADVIL) 800 MG tablet Take  800 mg by mouth every 8 (eight) hours as needed for moderate pain. 10/26/20   Magrinat, Virgie Dad, MD  Insulin Admin Supplies MISC Inject 24 Units into the skin daily.    [provider]  insulin glargine (LANTUS SOLOSTAR) 100 UNIT/ML Solostar Pen INJECT 24 UNITS INTO THE SKIN DAILY 02/13/21   Ronnald Nian, DO  Insulin Pen Needle 31G X 5 MM MISC  UAD for Sq inj for DM qd 01/13/20   Libby Maw, MD  lidocaine-prilocaine (EMLA) cream Apply to affected area once 03/22/21   Magrinat, Virgie Dad, MD  losartan (COZAAR) 50 MG tablet Take 1 tablet (50 mg total) by mouth 2 (two) times daily. 12/21/20   Cirigliano, Garvin Fila, DO  metoprolol succinate (TOPROL-XL) 25 MG 24 hr tablet TAKE 1 TABLET BY MOUTH EVERY DAY Patient taking differently: Take 25 mg by mouth daily. 10/17/20   Cirigliano, Mary K, DO  ondansetron (ZOFRAN) 8 MG tablet Take 1 tablet (8 mg total) by mouth 2 (two) times daily as needed (Nausea or vomiting). 03/22/21   Magrinat, Virgie Dad, MD  oxyCODONE-acetaminophen (PERCOCET) 10-325 MG tablet Take 1 tablet by mouth 2 (two) times daily as needed for pain. 03/22/21   Magrinat, Virgie Dad, MD  pantoprazole (PROTONIX) 40 MG tablet TAKE 1 TABLET BY MOUTH EVERY DAY Patient taking differently: Take 40 mg by mouth daily. 01/02/21   Cirigliano, Mary K, DO  pioglitazone (ACTOS) 15 MG tablet TAKE 1 TABLET BY MOUTH EVERY DAY Patient taking differently: Take 15 mg by mouth daily. 10/06/20   Cirigliano, Mary K, DO  pravastatin (PRAVACHOL) 20 MG tablet TAKE 1 TABLET BY MOUTH EVERY DAY Patient taking differently: Take 20 mg by mouth daily. 12/21/20   Cirigliano, Garvin Fila, DO  rivaroxaban (XARELTO) 20 MG TABS tablet Take 1 tablet (20 mg total) by mouth daily with supper. 02/22/21   Magrinat, Virgie Dad, MD  sitaGLIPtin-metformin (JANUMET) 50-1000 MG tablet Take 1 tablet by mouth daily with breakfast. 10/06/20   Cirigliano, Garvin Fila, DO  prochlorperazine (COMPAZINE) 10 MG tablet Take 1 tablet (10 mg total) by mouth every 6 (six) hours as needed (Nausea or vomiting). Patient not taking: No sig reported 10/26/20 01/06/21  Magrinat, Virgie Dad, MD     Family History  Problem Relation Age of Onset   Diabetes Mother    Breast cancer Mother 66   Cerebral aneurysm Mother    Pulmonary embolism Father    Colon cancer Father 76   Breast cancer Sister 90       noninvasive    Breast cancer Paternal Grandmother 31    Social History   Socioeconomic History   Marital status: Married    Spouse name: Not on file   Number of children: Not on file   Years of education: Not on file   Highest education level: Not on file  Occupational History   Not on file  Tobacco Use   Smoking status: Never   Smokeless tobacco: Never  Vaping Use   Vaping Use: Never used  Substance and Sexual Activity   Alcohol use: Not Currently   Drug use: Never   Sexual activity: Not Currently  Other Topics Concern   Not on file  Social History Narrative   Not on file   Social Determinants of Health   Financial Resource Strain: Low Risk    Difficulty of Paying Living Expenses: Not hard at all  Food Insecurity: No Food Insecurity   Worried About Charity fundraiser  in the Last Year: Never true   [place] in the Last Year: Never true  Transportation Needs: No Transportation Needs   Lack of Transportation (Medical): No   Lack of Transportation (Non-Medical): No  Physical Activity: Inactive   Days of Exercise per Week: 0 days   Minutes of Exercise per Session: 0 min  Stress: No Stress Concern Present   Feeling of Stress : Not at all  Social Connections: Moderately Isolated   Frequency of Communication with Friends and Family: More than three times a week   Frequency of Social Gatherings with Friends and Family: More than three times a week   Attends Religious Services: Never   Marine scientist or Organizations: No   Attends Archivist Meetings: Never   Marital Status: Married     Review of Systems: A 12 point ROS discussed and pertinent positives are indicated in the HPI above.  All other systems are negative.  Review of Systems  Constitutional:  Negative for chills and fever.  Respiratory:  Negative for shortness of breath and wheezing.   Cardiovascular:  Negative for chest pain and palpitations.  Gastrointestinal:  Negative for abdominal pain.   Neurological:  Negative for headaches.  Psychiatric/Behavioral:  Negative for confusion.    Vital Signs: LMP 10/01/2006 Comment: Total Hysterectomy  Physical Exam Vitals and nursing note reviewed.  Constitutional:      General: She is not in acute distress. Cardiovascular:     Rate and Rhythm: Normal rate and regular rhythm.     Heart sounds: Normal heart sounds. No murmur heard. Pulmonary:     Effort: Pulmonary effort is normal. No respiratory distress.     Breath sounds: Normal breath sounds. No wheezing.  Musculoskeletal:     Comments: (+) RUE lymphedema.  Skin:    General: Skin is warm and dry.  Neurological:     Mental Status: She is alert and oriented to person, place, and time.     MD Evaluation Airway: WNL Heart: WNL Abdomen: WNL Chest/ Lungs: WNL ASA  Classification: 3 Mallampati/Airway Score: One   Imaging: ECHOCARDIOGRAM COMPLETE  Result Date: 03/28/2021    ECHOCARDIOGRAM REPORT   Patient Name:   Mia [name] Date of Exam: 03/28/2021 Medical Rec #:  [id number]          Height:       64.0 in Accession #:    [id number]         Weight:       190.6 lb Date of Birth:  [birth year]         BSA:          1.917 m Patient Age:    18 years           BP:           116/58 mmHg Patient Gender: F                  HR:           97 bpm. Exam Location:  [address] Procedure: 2D Echo, Cardiac Doppler and Color Doppler Indications:    C50.919 Metastatic breast cancer  History:        Patient has no prior history of Echocardiogram examinations.                 Metastatic breast cancer.  Sonographer:    Coralyn Helling RDCS Referring Phys: [place]  Sonographer Comments: Technically challenging study due to limited  acoustic windows and Technically difficult study due to poor echo windows. IMPRESSIONS  1. Left ventricular ejection fraction, by estimation, is 60 to 65%. The left ventricle has normal function. Left ventricular endocardial border not optimally defined to  evaluate regional wall motion, but no wall motion abnormalities seen in views obtained. Left ventricular diastolic parameters are consistent with Grade I diastolic dysfunction (impaired relaxation).  2. Right ventricular systolic function was not well visualized. The right ventricular size is not well visualized.  3. The mitral valve is grossly normal. No evidence of mitral valve regurgitation. No evidence of mitral stenosis.  4. The aortic valve was not well visualized, though likely trileaflet. Aortic valve regurgitation is not visualized. No aortic stenosis is present. Comparison(s): No prior Echocardiogram. FINDINGS  Left Ventricle: Left ventricular ejection fraction, by estimation, is 60 to 65%. The left ventricle has normal function. Left ventricular endocardial border not optimally defined to evaluate regional wall motion. The left ventricular internal cavity size was normal in size. There is no left ventricular hypertrophy. Left ventricular diastolic parameters are consistent with Grade I diastolic dysfunction (impaired relaxation). Right Ventricle: The right ventricular size is not well visualized. Right vetricular wall thickness was not assessed. Right ventricular systolic function was not well visualized. Left Atrium: Left atrial size was normal in size. Right Atrium: Right atrial size was not assessed. Pericardium: There is no evidence of pericardial effusion. Mitral Valve: The mitral valve is grossly normal. No evidence of mitral valve regurgitation. No evidence of mitral valve stenosis. Tricuspid Valve: The tricuspid valve is not well visualized. Tricuspid valve regurgitation is not demonstrated. No evidence of tricuspid stenosis. Aortic Valve: The aortic valve was not well visualized. Aortic valve regurgitation is not visualized. No aortic stenosis is present. Pulmonic Valve: The pulmonic valve was not well visualized. Pulmonic valve regurgitation is not visualized. No evidence of pulmonic stenosis.  Aorta: The aortic root and ascending aorta are structurally normal, with no evidence of dilitation. Venous: The inferior vena cava was not well visualized. IAS/Shunts: The interatrial septum was not assessed.  LEFT VENTRICLE PLAX 2D LVIDd:         4.40 cm  Diastology LVIDs:         3.10 cm  LV e' medial:    7.94 cm/s LV PW:         0.80 cm  LV E/e' medial:  11.2 LV IVS:        1.00 cm  LV e' lateral:   6.42 cm/s LVOT diam:     2.00 cm  LV E/e' lateral: 13.9 LV SV:         47 LV SV Index:   25 LVOT Area:     3.14 cm  LEFT ATRIUM           Index       RIGHT ATRIUM LA diam:      3.00 cm 1.57 cm/m  RA Pressure: 3.00 mmHg LA Vol (A4C): 38.6 ml 20.14 ml/m  AORTIC VALVE LVOT Vmax:   85.00 cm/s LVOT Vmean:  60.200 cm/s LVOT VTI:    0.151 m  AORTA Ao Root diam: 3.30 cm Ao Asc diam:  3.20 cm MITRAL VALVE               TRICUSPID VALVE MV Area (PHT): 3.34 cm    Estimated RAP:  3.00 mmHg MV Decel Time: 227 msec MV E velocity: 89.20 cm/s  SHUNTS MV A velocity: 89.20 cm/s  Systemic VTI:  0.15 m MV E/A  ratio:  1.00        Systemic Diam: 2.00 cm Rudean Haskell MD Electronically signed by Rudean Haskell MD Signature Date/Time: 03/28/2021/3:32:49 PM    Final     Labs:  CBC: Recent Labs    01/24/21 1015 02/10/21 1035 03/10/21 1458 03/20/21 1335  WBC 12.3* 9.6 6.8 9.5  HGB 8.7* 8.5* 9.5* 9.6*  HCT 28.7* 28.2* 31.0* 31.4*  PLT 471* 430* 377 402*    COAGS: Recent Labs    01/10/21 0434 02/10/21 1035  INR 1.1 1.3*    BMP: Recent Labs    04/13/20 1250 05/11/20 1119 06/08/20 1326 07/06/20 1312 01/24/21 1015 02/10/21 1035 03/10/21 1458 03/20/21 1335  NA 142 139 140   < > 137 133* 132* 133*  K 4.9 4.4 4.7   < > 4.6 4.8 4.4 5.2*  CL 106 106 106   < > 97* 96* 94* 94*  CO2 _0 < > _1 GLUCOSE 98 111* 101*   < > 124* 108* 437* 338*  BUN _2 < > 7* _3 CALCIUM 9.8 10.0 9.4   < > 9.3 8.9 9.1 9.2  CREATININE 1.25* 1.11* 1.22*   < > 0.66 0.94 1.03* 0.89  GFRNONAA  46* 53* 47*   < > >60 >60 >60 >60  GFRAA 53* >60 55*  --   --   --   --   --    < > = values in this interval not displayed.    LIVER FUNCTION TESTS: Recent Labs    01/13/21 0456 01/24/21 1015 03/10/21 1458 03/20/21 1335  BILITOT 0.6 0.5 0.5 0.8  AST 26 22 9* 11*  ALT 12 9 <6 10  ALKPHOS 85 144* 127* 95  PROT 5.9* 6.3* 6.6 6.8  ALBUMIN 2.6* 2.5* 2.8* 3.1*     Assessment and Plan:  Breast cancer (estrogen positive stage IV) s/p right mastectomy and multiple rounds of systemic chemotherapy/radiation therapy who is currently undergoing additional rounds systemic chemotherapy as management and is in need of durable IV access for administration. Plan for image-guided Port-a-cath placement today in IR. Patient is NPO. Afebrile.  Risks and benefits of image-guided Port-a-catheter placement were discussed with the patient including, but not limited to bleeding, infection, pneumothorax, or fibrin sheath development and need for additional procedures. All of the patient's questions were answered, patient is agreeable to proceed. Consent signed and in chart.   Thank you for this interesting consult.  I greatly enjoyed meeting Jaelynn Pozo and look forward to participating in their care.  A copy of this report was sent to the requesting provider on this date.  Electronically Signed: Earley Abide, PA-C 03/30/2021, 1:25 PM   I spent a total of 25 Minutes in face to face in clinical consultation, greater than 50% of which was counseling/coordinating care for breast cancer/Port-a-cath placement.

## 2021-03-30 ENCOUNTER — Other Ambulatory Visit: Payer: Self-pay | Admitting: Family Medicine

## 2021-03-30 ENCOUNTER — Encounter (HOSPITAL_COMMUNITY): Payer: Self-pay

## 2021-03-30 ENCOUNTER — Observation Stay (HOSPITAL_COMMUNITY)
Admission: RE | Admit: 2021-03-30 | Discharge: 2021-03-30 | Disposition: A | Discharge status: Home / Self Care | Payer: MEDICARE | Source: Ambulatory Visit | Attending: Oncology | Admitting: Oncology

## 2021-03-30 ENCOUNTER — Other Ambulatory Visit: Payer: Self-pay | Admitting: *Deleted

## 2021-03-30 ENCOUNTER — Other Ambulatory Visit: Payer: Self-pay

## 2021-03-30 ENCOUNTER — Ambulatory Visit (HOSPITAL_COMMUNITY)
Admission: RE | Admit: 2021-03-30 | Discharge: 2021-03-30 | Disposition: A | Discharge status: Home / Self Care | Payer: MEDICARE | Source: Ambulatory Visit | Attending: Adult Health | Admitting: Adult Health

## 2021-03-30 DIAGNOSIS — Z885 Allergy status to narcotic agent status: Secondary | ICD-10-CM | POA: Diagnosis not present

## 2021-03-30 DIAGNOSIS — C50919 Malignant neoplasm of unspecified site of unspecified female breast: Secondary | ICD-10-CM

## 2021-03-30 DIAGNOSIS — Z760 Encounter for issue of repeat prescription: Secondary | ICD-10-CM

## 2021-03-30 DIAGNOSIS — E785 Hyperlipidemia, unspecified: Secondary | ICD-10-CM | POA: Diagnosis not present

## 2021-03-30 DIAGNOSIS — Z923 Personal history of irradiation: Secondary | ICD-10-CM | POA: Diagnosis not present

## 2021-03-30 DIAGNOSIS — Z79899 Other long term (current) drug therapy: Secondary | ICD-10-CM | POA: Diagnosis not present

## 2021-03-30 DIAGNOSIS — Z170 Estrogen receptor positive status [ER+]: Secondary | ICD-10-CM | POA: Diagnosis not present

## 2021-03-30 DIAGNOSIS — Z86711 Personal history of pulmonary embolism: Secondary | ICD-10-CM | POA: Insufficient documentation

## 2021-03-30 DIAGNOSIS — C7800 Secondary malignant neoplasm of unspecified lung: Secondary | ICD-10-CM

## 2021-03-30 DIAGNOSIS — I10 Essential (primary) hypertension: Secondary | ICD-10-CM | POA: Insufficient documentation

## 2021-03-30 DIAGNOSIS — Z9011 Acquired absence of right breast and nipple: Secondary | ICD-10-CM | POA: Diagnosis not present

## 2021-03-30 DIAGNOSIS — Z6835 Body mass index (BMI) 35.0-35.9, adult: Secondary | ICD-10-CM | POA: Diagnosis not present

## 2021-03-30 DIAGNOSIS — Z7901 Long term (current) use of anticoagulants: Secondary | ICD-10-CM | POA: Diagnosis not present

## 2021-03-30 DIAGNOSIS — Z7984 Long term (current) use of oral hypoglycemic drugs: Secondary | ICD-10-CM | POA: Insufficient documentation

## 2021-03-30 DIAGNOSIS — E669 Obesity, unspecified: Secondary | ICD-10-CM | POA: Insufficient documentation

## 2021-03-30 DIAGNOSIS — C7951 Secondary malignant neoplasm of bone: Secondary | ICD-10-CM

## 2021-03-30 DIAGNOSIS — Z452 Encounter for adjustment and management of vascular access device: Secondary | ICD-10-CM | POA: Diagnosis not present

## 2021-03-30 DIAGNOSIS — I890 Lymphedema, not elsewhere classified: Secondary | ICD-10-CM | POA: Diagnosis not present

## 2021-03-30 DIAGNOSIS — M25511 Pain in right shoulder: Secondary | ICD-10-CM

## 2021-03-30 DIAGNOSIS — Z794 Long term (current) use of insulin: Secondary | ICD-10-CM | POA: Insufficient documentation

## 2021-03-30 DIAGNOSIS — Z9221 Personal history of antineoplastic chemotherapy: Secondary | ICD-10-CM | POA: Insufficient documentation

## 2021-03-30 DIAGNOSIS — E1140 Type 2 diabetes mellitus with diabetic neuropathy, unspecified: Secondary | ICD-10-CM | POA: Diagnosis not present

## 2021-03-30 HISTORY — PX: IR IMAGING GUIDED PORT INSERTION: IMG5740

## 2021-03-30 LAB — GLUCOSE, CAPILLARY: Glucose-Capillary: 93 mg/dL (ref 70–99)

## 2021-03-30 MED ORDER — HEPARIN SOD (PORK) LOCK FLUSH 100 UNIT/ML IV SOLN
INTRAVENOUS | Status: AC | PRN
  Administered 2021-03-30: 500 [IU] via INTRAVENOUS

## 2021-03-30 MED ORDER — SODIUM CHLORIDE 0.9 % IV SOLN
INTRAVENOUS | Status: DC
Start: 2021-03-30 — End: 2021-03-31

## 2021-03-30 MED ORDER — MIDAZOLAM HCL 2 MG/2ML IJ SOLN
INTRAMUSCULAR | Status: AC | PRN
  Administered 2021-03-30 (×3): 1 mg via INTRAVENOUS

## 2021-03-30 MED ORDER — FENTANYL CITRATE (PF) 100 MCG/2ML IJ SOLN
INTRAMUSCULAR | Status: AC
  Filled 2021-03-30: qty 2

## 2021-03-30 MED ORDER — FENTANYL CITRATE (PF) 100 MCG/2ML IJ SOLN
INTRAMUSCULAR | Status: AC | PRN
  Administered 2021-03-30 (×2): 50 ug via INTRAVENOUS

## 2021-03-30 MED ORDER — LIDOCAINE-EPINEPHRINE 1 %-1:100000 IJ SOLN
INTRAMUSCULAR | Status: AC
  Filled 2021-03-30: qty 1

## 2021-03-30 MED ORDER — HEPARIN SOD (PORK) LOCK FLUSH 100 UNIT/ML IV SOLN
INTRAVENOUS | Status: AC
  Filled 2021-03-30: qty 5

## 2021-03-30 MED ORDER — LIDOCAINE-EPINEPHRINE 1 %-1:100000 IJ SOLN
INTRAMUSCULAR | Status: AC | PRN
  Administered 2021-03-30: 10 mL via INTRADERMAL

## 2021-03-30 MED ORDER — LIDOCAINE HCL (PF) 1 % IJ SOLN
INTRAMUSCULAR | Status: AC | PRN
  Administered 2021-03-30: 10 mL via INTRADERMAL

## 2021-03-30 MED ORDER — MIDAZOLAM HCL 2 MG/2ML IJ SOLN
INTRAMUSCULAR | Status: AC
  Filled 2021-03-30: qty 4

## 2021-03-30 MED ORDER — LIDOCAINE HCL 1 % IJ SOLN
INTRAMUSCULAR | Status: AC
  Filled 2021-03-30: qty 20

## 2021-03-30 NOTE — Discharge Instructions (Signed)
Interventional radiology phone numbers °[phone] °After hours [phone] ° ° ° °You have skin glue (dermabond) over your new port. Do not use the lidocaine cream (EMLA cream) over the skin glue until it has healed. The petroleum in the lidocaine cream will dissolve the skin glue resulting in an infection of your new port. Use ice in a zip lock bag for 1-2 minutes over your new port before the cancer center nurses access your port. ° ° °Implanted Port Insertion, Care After °This sheet gives you information about how to care for yourself after your procedure. Your health care provider may also give you more specific instructions. If you have problems or questions, contact your health care provider. °What can I expect after the procedure? °After the procedure, it is common to have: °Discomfort at the port insertion site. °Bruising on the skin over the port. This should improve over 3-4 days. °Follow these instructions at home: °Port care °After your port is placed, you will get a manufacturer's information card. The card has information about your port. Keep this card with you at all times. °Take care of the port as told by your health care provider. Ask your health care provider if you or a family member can get training for taking care of the port at home. A home health care nurse may also take care of the port. °Make sure to remember what type of port you have. °Incision care °Follow instructions from your health care provider about how to take care of your port insertion site. Make sure you: °Wash your hands with soap and water before and after you change your bandage (dressing). If soap and water are not available, use hand sanitizer. °Change your dressing as told by your health care provider. °Leave skin glue in place. These skin closures may need to stay in place for 2 weeks or longer.  °Check your port insertion site every day for signs of infection. Check for: °Redness, swelling, or pain. °Fluid or  blood. °Warmth. °Pus or a bad smell.  °  °  °Activity °Return to your normal activities as told by your health care provider. Ask your health care provider what activities are safe for you. °Do not lift anything that is heavier than 10 lb (4.5 kg), or the limit that you are told, until your health care provider says that it is safe. °General instructions °Take over-the-counter and prescription medicines only as told by your health care provider. °Do not take baths, swim, or use a hot tub until your health care provider approves.You may remove your dressing tomorrow and shower 24 hours after your procedure. °Do not drive for 24 hours if you were given a sedative during your procedure. °Wear a medical alert bracelet in case of an emergency. This will tell any health care providers that you have a port. °Keep all follow-up visits as told by your health care provider. This is important. °Contact a health care provider if: °You cannot flush your port with saline as directed, or you cannot draw blood from the port. °You have a fever or chills. °You have redness, swelling, or pain around your port insertion site. °You have fluid or blood coming from your port insertion site. °Your port insertion site feels warm to the touch. °You have pus or a bad smell coming from the port insertion site. °Get help right away if: °You have chest pain or shortness of breath. °You have bleeding from your port that you cannot control. °Summary °Take care of   the port as told by your health care provider. Keep the manufacturer's information card with you at all times. °Change your dressing as told by your health care provider. °Contact a health care provider if you have a fever or chills or if you have redness, swelling, or pain around your port insertion site. °Keep all follow-up visits as told by your health care provider. °This information is not intended to replace advice given to you by your health care provider. Make sure you discuss any  questions you have with your health care provider. °Document Revised: 04/15/2018 Document Reviewed: 04/15/2018 °Elsevier Patient Education © 2021 [employer] Inc[employer] ° ° ° °Moderate Conscious Sedation, Adult, Care After °This sheet gives you information about how to care for yourself after your procedure. Your health care provider may also give you more specific instructions. If you have problems or questions, contact your health care provider. °What can I expect after the procedure? °After the procedure, it is common to have: °Sleepiness for several hours. °Impaired judgment for several hours. °Difficulty with balance. °Vomiting if you eat too soon. °Follow these instructions at home: °For the time period you were told by your health care provider: °Rest. °Do not participate in activities where you could fall or become injured. °Do not drive or use machinery. °Do not drink alcohol. °Do not take sleeping pills or medicines that cause drowsiness. °Do not make important decisions or sign legal documents. °Do not take care of children on your own.  °  °  °Eating and drinking °Follow the diet recommended by your health care provider. °Drink enough fluid to keep your urine pale yellow. °If you vomit: °Drink water, juice, or soup when you can drink without vomiting. °Make sure you have little or no nausea before eating solid foods.   °General instructions °Take over-the-counter and prescription medicines only as told by your health care provider. °Have a responsible adult stay with you for the time you are told. It is important to have someone help care for you until you are awake and alert. °Do not smoke. °Keep all follow-up visits as told by your health care provider. This is important. °Contact a health care provider if: °You are still sleepy or having trouble with balance after 24 hours. °You feel light-headed. °You keep feeling nauseous or you keep vomiting. °You develop a rash. °You have a fever. °You have redness or  swelling around the IV site. °Get help right away if: °You have trouble breathing. °You have new-onset confusion at home. °Summary °After the procedure, it is common to feel sleepy, have impaired judgment, or feel nauseous if you eat too soon. °Rest after you get home. Know the things you should not do after the procedure. °Follow the diet recommended by your health care provider and drink enough fluid to keep your urine pale yellow. °Get help right away if you have trouble breathing or new-onset confusion at home. °This information is not intended to replace advice given to you by your health care provider. Make sure you discuss any questions you have with your health care provider. °Document Revised: 01/15/2020 Document Reviewed: 08/13/2019 °Elsevier Patient Education © 2021 Else[employer] Inc.  °

## 2021-03-30 NOTE — Procedures (Signed)
Interventional Radiology Procedure:   Indications: Metastatic breast cancer  Procedure: Port placement  Findings: Right jugular port, tip at SVC/RA junction  Complications: None     EBL: Minimal, less than 10 ml  Plan: Discharge in one hour.  Keep port site and incisions dry for at least 24 hours.     Kataleena Holsapple R. Anselm Pancoast, MD  Pager: [phone]

## 2021-04-04 NOTE — Progress Notes (Signed)
Pharmacist Chemotherapy Monitoring - Initial Assessment    Anticipated start date: 04/11/21   The following has been reviewed per standard work regarding the patient's treatment regimen: The patient's diagnosis, treatment plan and drug doses, and organ/hematologic function Lab orders and baseline tests specific to treatment regimen  The treatment plan start date, drug sequencing, and pre-medications Prior authorization status  Patient's documented medication list, including drug-drug interaction screen and prescriptions for anti-emetics and supportive care specific to the treatment regimen The drug concentrations, fluid compatibility, administration routes, and timing of the medications to be used The patient's access for treatment and lifetime cumulative dose history, if applicable  The patient's medication allergies and previous infusion related reactions, if applicable   Changes made to treatment plan:  N/A  Follow up needed:  Pending authorization for treatment    Larene Beach, [place], 04/04/2021  12:45 PM

## 2021-04-06 DIAGNOSIS — S42294D Other nondisplaced fracture of upper end of right humerus, subsequent encounter for fracture with routine healing: Secondary | ICD-10-CM | POA: Diagnosis not present

## 2021-04-07 ENCOUNTER — Other Ambulatory Visit: Payer: Self-pay

## 2021-04-07 ENCOUNTER — Encounter: Payer: Self-pay | Admitting: Family Medicine

## 2021-04-07 MED ORDER — AMLODIPINE BESYLATE 10 MG PO TABS: 10.0000 mg | ORAL_TABLET | Freq: Every day | ORAL | 0 refills | Status: DC

## 2021-04-09 ENCOUNTER — Other Ambulatory Visit: Payer: Self-pay | Admitting: Family Medicine

## 2021-04-10 NOTE — Telephone Encounter (Signed)
Last OV 11/04/20 Last fill 10/17/20  #90/1

## 2021-04-10 NOTE — Progress Notes (Signed)
Mia [name]  [phone] [phone]    ID: Mia [name] DOB: [birth year]  MR#: [id number]  OEH#:[id number]  Patient Care Team: Mia Nian, DO as PCP - General (Family Medicine) Mia [name], Mia Dad, MD as Consulting Physician (Oncology) OTHER MD: Mia Austria MD, Mia Sabal PA   CHIEF COMPLAINT: Estrogen receptor positive stage IV breast cancer (s/p right mastectomy)  CURRENT TREATMENT: Doxil, rivaroxaban, Venofer   INTERVAL HISTORY: Mia [name] returns today for follow-up and treatment of her estrogen receptor positive stage IV breast cancer.  She is accompanied by [name]  She developed a significant rise in her A1c on the alpelisib and fulvestrant. Accordingly, we stopped that medication.  We are changing her treatment to Doxil today.  Echo 03/28/2021 showed an ejection fraction in the 60-65% range.  She underwent port placement on 03/30/2021.  Mia [name] has learned how to drain the left pleural effusion.  He did that on his own yesterday and that went well.   REVIEW OF SYSTEMS: Mia [name] tells me her blood sugars are now back to her baseline and indeed today her glucose level is normal.  She tolerated port placement without any significant side effects other than the expected mild soreness and she is delighted at how well the port worked today.  She tells me her prior 2 ports really were very problematic.  We also discussed her echo results which are favorable.  Her pain is very well controlled on a single Percocet 10/320 5 in the morning.  This does not constipate her.  At home she has her O2 on a cane and she walks from one end of the townhouse to the other without difficulty.  She is not more short of breath than usual, has had no fainting symptoms, and denies chest pain.  She tells me she has feeling of thickness in the soles of her feet and some numbness in her right hand neither of which is new and neither of which sounds like  peripheral neuropathy.  Detailed review of systems today was otherwise stable   COVID 19 VACCINATION STATUS: [place] x2, with booster 08/16/2020   HISTORY OF CURRENT ILLNESS: From the original intake note:  We have reviewed the medical records from [place], which is the source of the information below:  Mia [name] was initially diagnosed with Stage IIIA (T2N2M0) invasive ductal carcinoma, estrogen receptor positive and HER2 negative right breast cancer in 2000. She underwent right mastectomy, with 4 positive metastatic lymph nodes. She had chemotherapy with taxol and cytoxan and fulvestrant in the past. She had radiation and completed 5 years of tamoxifen.   She was diagnosed with Stage IV disease 04/18/2005 with metastases to the bone, lung, and additional nodes. She was on capecitabine but was discontinued after rising CA 27-29 and scans.   She started anastrozole and everolimus with Delton See in April 2014. She tolerated this well, but this was discontinued in July 2014 due to abnormal blood tests, hyperlipidemia and hyperglycemia.  She began Abraxane 158m /m2 and Xgeva in August 2014 weekly x3 with 1 week off. She tolerated this treatment well. She discontinued Xgeva January 2016 due to osteonecrosis of the jaw and mouth issues. She continued abraxane alone until her last dose on 11/18/2015 due to neuropathy and disease progression with restaging studies on 11/23/2015 with a CT chest abdomen and pelvis and one scan showing skull, sternal and femoral lesions.   She was switched to gemcitabine starting 01/20/2016 weekly x3 and 1 week off. This  was discontinued on 06/15/2016 due to a port related jugular clot on the right side. She was given Lovenox for 3 months.  She was switched to Letrozole 2.5 mg and palbociclib 125 mg starting 07/13/2016. She tolerated this well. Restaging studies with CT chest abdomen and pelvis and bone scan on 07/01/2017 showed stable/ improving lesions. CA  27-29 was stable.  During the last few months of follow up in [place], she completed restaging studies with a CT chest abdomen and pelvis at [facility] on 12/17/2017 showing: A 6 mm pulmonary nodule in the left upper lobe laterally. Progression of metastatic disease to the bones at the L4 and L5 vertebral bodies. Sclerotic metastases at T8, T10, an T11, are similar to previous exams. Sclerotic metastases in the sternum stable. Hepatic steatosis. Aortic atherosclerosis.  Most recent CA-36-29 (January 2019) was 58.  Most recent hemoglobin A1c was 7.1 according to the patient  The patient's subsequent history is as detailed below.   PAST MEDICAL HISTORY: Past Medical History:  Diagnosis Date   Breast cancer metastasized to bone [facility])    Diabetes mellitus without complication ([place])    Dyspnea    History of radiation therapy 11/18/2018-12/02/2018   lumbar spine and pelvis   Dr [name]   History of radiation therapy 11/08/2020-11/18/2020   right arm   Dr [name]   Hyperlipidemia    Hypertension    Lymphedema of right arm    Neuropathy associated with cancer ([place])    Obesity (BMI 35.0-39.9 without comorbidity)    PONV (postoperative nausea and vomiting)    Pulmonary embolism ([place]) 12/2020     PAST SURGICAL HISTORY: Past Surgical History:  Procedure Laterality Date   CATARACT EXTRACTION Left    HUMERUS IM NAIL Right 01/11/2021   Procedure: INTRAMEDULLARY (IM) NAIL HUMERAL;  Surgeon: Hiram Gash, MD;  Location: [facility];  Service: Orthopedics;  Laterality: Right;   HYSTERECTOMY ABDOMINAL WITH SALPINGO-OOPHORECTOMY     IR IMAGING GUIDED PORT INSERTION  03/30/2021   IR PERC PLEURAL DRAIN W/INDWELL CATH W/IMG GUIDE  02/10/2021   MODIFIED RADICAL MASTECTOMY Right      FAMILY HISTORY: Family History  Problem Relation Age of Onset   Diabetes Mother    Breast cancer Mother 31   Cerebral aneurysm Mother    Pulmonary embolism Father    Colon cancer Father 76   Breast cancer Sister 43        noninvasive   Breast cancer Paternal Grandmother 1   The patient reports she had negative genetic testing at [facility] 2014, report not available. --The patient's father died at age 85 due to a PE, and he also had a history of colon cancer diagnosed at age 60. The patient's mother died at age 21 due to diabetes and survived a cerebral aneurysm. The patient's mother also had a history of breast cancer diagnosed at age 72.  The patient has no brothers and 1 sister. The patient's sister was diagnosed with non invasive breast cancer at age 74. There was a paternal grandmother diagnosed with breast cancer at age 95. The patient denies a family history of ovarian cancer.    GYNECOLOGIC HISTORY:  Patient's last menstrual period was 10/01/2006. Menarche: 65 years old Age at first live birth: 65 years old She is [place] P2.  She is status post hysterectomy with bilateral salpingo- oophorectomy 12/23/2006 with benign pathology ([id number]) She never used HRT.    SOCIAL HISTORY:  Mia [name] is disabled due to her breast  cancer. She used to be Surveyor, quantity for a cardiology office. Her husband, Mia [name] is in the process of retiring. He is a Electrical engineer for a power company. The patient's daughter, [name], lives in [place] and works as a Teaching laboratory technician. The patient's second daughter, [name] is a stay at home mom and is a special needs teacher, who will soon move to [facility] [place]). The patient plans on attending [employer].     ADVANCED DIRECTIVES: In the absence of any documentation to the contrary, the patient's spouse is their HCPOA.    HEALTH MAINTENANCE: Social History   Tobacco Use   Smoking status: Never   Smokeless tobacco: Never  Vaping Use   Vaping Use: Never used  Substance Use Topics   Alcohol use: Not Currently   Drug use: Never    Colonoscopy: 2017   PAP:  Bone density: never  Mammogram: 2017   Allergies  Allergen  Reactions   Morphine And Related Anaphylaxis    Current Outpatient Medications  Medication Sig Dispense Refill   glucose blood ([employer] GLUCOSE METER TEST STRIPS) test strip Use as instructed 100 each 12   Lancets (FREESTYLE) lancets Use as instructed 100 each 12   amLODipine (NORVASC) 10 MG tablet Take 1 tablet (10 mg total) by mouth daily. 90 tablet 0   blood glucose meter kit and supplies KIT Dispense based on patient and insurance preference. Use up to four times daily as directed. 1 each 0   fenofibrate (TRICOR) 145 MG tablet TAKE 1 TABLET BY MOUTH EVERY DAY (Patient taking differently: Take 145 mg by mouth daily.) 90 tablet 2   fluticasone (FLONASE) 50 MCG/ACT nasal spray SPRAY 2 SPRAYS INTO EACH NOSTRIL EVERY DAY 48 mL 1   gabapentin (NEURONTIN) 300 MG capsule Take 1 capsule (300 mg total) by mouth 4 (four) times daily. Pt is only taking 2 capsules a day (Patient taking differently: Take 300 mg by mouth 2 (two) times daily.) 180 capsule 0   glucose blood test strip E11.69 Use as instructed 100 each 12   ibuprofen (ADVIL) 800 MG tablet Take 800 mg by mouth every 8 (eight) hours as needed for moderate pain. 30 tablet 0   Insulin Admin Supplies MISC Inject 24 Units into the skin daily.     insulin glargine (LANTUS SOLOSTAR) 100 UNIT/ML Solostar Pen INJECT 24 UNITS INTO THE SKIN DAILY 15 mL 1   Insulin Pen Needle 31G X 5 MM MISC UAD for Sq inj for DM qd 100 each PRN   JANUMET 50-1000 MG tablet TAKE 1 TABLET BY MOUTH EVERY DAY WITH BREAKFAST 90 tablet 1   lidocaine-prilocaine (EMLA) cream Apply to affected area once 30 g 3   losartan (COZAAR) 50 MG tablet Take 1 tablet (50 mg total) by mouth 2 (two) times daily. 180 tablet 3   metoprolol succinate (TOPROL-XL) 25 MG 24 hr tablet TAKE 1 TABLET BY MOUTH EVERY DAY 90 tablet 1   ondansetron (ZOFRAN) 8 MG tablet Take 1 tablet (8 mg total) by mouth 2 (two) times daily as needed (Nausea or vomiting). 30 tablet 1   oxyCODONE-acetaminophen (PERCOCET)  10-325 MG tablet Take 1 tablet by mouth 2 (two) times daily as needed for pain. 30 tablet 0   pantoprazole (PROTONIX) 40 MG tablet TAKE 1 TABLET BY MOUTH EVERY DAY (Patient taking differently: Take 40 mg by mouth daily.) 90 tablet 0   pioglitazone (ACTOS) 15 MG tablet TAKE 1 TABLET BY MOUTH EVERY DAY 90  tablet 1   pravastatin (PRAVACHOL) 20 MG tablet TAKE 1 TABLET BY MOUTH EVERY DAY (Patient taking differently: Take 20 mg by mouth daily.) 90 tablet 2   rivaroxaban (XARELTO) 20 MG TABS tablet Take 1 tablet (20 mg total) by mouth daily with supper. 30 tablet 3   No current facility-administered medications for this visit.    OBJECTIVE: White woman examined in a wheelchair Vitals:   04/11/21 1404  BP: 104/61  [name]: 76  Resp: 18  Temp: (!) 97.5 F (36.4 C)  SpO2: 100%   Wt Readings from Last 3 Encounters:  03/22/21 190 lb 9.6 oz (86.5 kg)  01/11/21 218 lb 14.7 oz (99.3 kg)  01/05/21 219 lb (99.3 kg)   There is no height or weight on file to calculate BMI.   ECOG FS:2 - Symptomatic, <50% confined to bed  Sclerae unicteric, EOMs intact Wearing a mask No cervical or supraclavicular adenopathy Lungs no rales or rhonchi Heart regular rate and rhythm Abd soft, nontender, positive bowel sounds MSK no focal spinal tenderness, chronic right upper extremity lymphedema Neuro: nonfocal, well oriented, appropriate affect Breasts: Deferred   LAB RESULTS:  CMP   No results found for: TOTALPROTELP, ALBUMINELP, A1GS, A2GS, BETS, BETA2SER, GAMS, MSPIKE, SPEI  No results found for: KPAFRELGTCHN, LAMBDASER, KAPLAMBRATIO  Lab Results  Component Value Date   WBC 12.7 (H) 04/11/2021   NEUTROABS 10.6 (H) 04/11/2021   HGB 7.8 (L) 04/11/2021   HCT 25.4 (L) 04/11/2021   MCV 87.9 04/11/2021   PLT 550 (H) 04/11/2021    No results found for: LABCA2  No components found for: [id number]  No results for input(s): INR in the last 168 hours.  No results found for: LABCA2  No results found  for: NAT557  No results found for: DUK025  No results found for: KYH062  Lab Results  Component Value Date   CA2729 109.9 (H) 03/10/2021    No components found for: HGQUANT  No results found for: CEA1 / No results found for: CEA1   No results found for: AFPTUMOR  No results found for: CHROMOGRNA  No results found for: HGBA, HGBA2QUANT, HGBFQUANT, HGBSQUAN (Hemoglobinopathy evaluation)   No results found for: LDH  No results found for: IRON, TIBC, IRONPCTSAT (Iron and TIBC)  No results found for: FERRITIN  Urinalysis    Component Value Date/Time   COLORURINE YELLOW 01/05/2021 2200   APPEARANCEUR CLEAR 01/05/2021 2200   LABSPEC 1.009 01/05/2021 2200   PHURINE 5.0 01/05/2021 2200   GLUCOSEU NEGATIVE 01/05/2021 2200   HGBUR NEGATIVE 01/05/2021 2200   BILIRUBINUR NEGATIVE 01/05/2021 2200   KETONESUR NEGATIVE 01/05/2021 2200   PROTEINUR NEGATIVE 01/05/2021 2200   NITRITE NEGATIVE 01/05/2021 2200   LEUKOCYTESUR NEGATIVE 01/05/2021 2200    STUDIES: ECHOCARDIOGRAM COMPLETE  Result Date: 03/28/2021    ECHOCARDIOGRAM REPORT   Patient Name:   NINAH MOCCIO Date of Exam: 03/28/2021 Medical Rec #:  [id number]          Height:       64.0 in Accession #:    [id number]         Weight:       190.6 lb Date of Birth:  04-Aug-1956         BSA:          1.917 m Patient Age:    65 years           BP:           116/58 mmHg Patient Gender: F  HR:           97 bpm. Exam Location:  [address] Procedure: 2D Echo, Cardiac Doppler and Color Doppler Indications:    C50.919 Metastatic breast cancer  History:        Patient has no prior history of Echocardiogram examinations.                 Metastatic breast cancer.  Sonographer:    Coralyn Helling RDCS Referring Phys: [place] Comments: Technically challenging study due to limited acoustic windows and Technically difficult study due to poor echo windows. IMPRESSIONS  1. Left ventricular ejection  fraction, by estimation, is 60 to 65%. The left ventricle has normal function. Left ventricular endocardial border not optimally defined to evaluate regional wall motion, but no wall motion abnormalities seen in views obtained. Left ventricular diastolic parameters are consistent with Grade I diastolic dysfunction (impaired relaxation).  2. Right ventricular systolic function was not well visualized. The right ventricular size is not well visualized.  3. The mitral valve is grossly normal. No evidence of mitral valve regurgitation. No evidence of mitral stenosis.  4. The aortic valve was not well visualized, though likely trileaflet. Aortic valve regurgitation is not visualized. No aortic stenosis is present. Comparison(s): No prior Echocardiogram. FINDINGS  Left Ventricle: Left ventricular ejection fraction, by estimation, is 60 to 65%. The left ventricle has normal function. Left ventricular endocardial border not optimally defined to evaluate regional wall motion. The left ventricular internal cavity size was normal in size. There is no left ventricular hypertrophy. Left ventricular diastolic parameters are consistent with Grade I diastolic dysfunction (impaired relaxation). Right Ventricle: The right ventricular size is not well visualized. Right vetricular wall thickness was not assessed. Right ventricular systolic function was not well visualized. Left Atrium: Left atrial size was normal in size. Right Atrium: Right atrial size was not assessed. Pericardium: There is no evidence of pericardial effusion. Mitral Valve: The mitral valve is grossly normal. No evidence of mitral valve regurgitation. No evidence of mitral valve stenosis. Tricuspid Valve: The tricuspid valve is not well visualized. Tricuspid valve regurgitation is not demonstrated. No evidence of tricuspid stenosis. Aortic Valve: The aortic valve was not well visualized. Aortic valve regurgitation is not visualized. No aortic stenosis is present.  Pulmonic Valve: The pulmonic valve was not well visualized. Pulmonic valve regurgitation is not visualized. No evidence of pulmonic stenosis. Aorta: The aortic root and ascending aorta are structurally normal, with no evidence of dilitation. Venous: The inferior vena cava was not well visualized. IAS/Shunts: The interatrial septum was not assessed.  LEFT VENTRICLE PLAX 2D LVIDd:         4.40 cm  Diastology LVIDs:         3.10 cm  LV e' medial:    7.94 cm/s LV PW:         0.80 cm  LV E/e' medial:  11.2 LV IVS:        1.00 cm  LV e' lateral:   6.42 cm/s LVOT diam:     2.00 cm  LV E/e' lateral: 13.9 LV SV:         47 LV SV Index:   25 LVOT Area:     3.14 cm  LEFT ATRIUM           Index       RIGHT ATRIUM LA diam:      3.00 cm 1.57 cm/m  RA Pressure: 3.00 mmHg LA Vol (A4C): 38.6  ml 20.14 ml/m  AORTIC VALVE LVOT Vmax:   85.00 cm/s LVOT Vmean:  60.200 cm/s LVOT VTI:    0.151 m  AORTA Ao Root diam: 3.30 cm Ao Asc diam:  3.20 cm MITRAL VALVE               TRICUSPID VALVE MV Area (PHT): 3.34 cm    Estimated RAP:  3.00 mmHg MV Decel Time: 227 msec MV E velocity: 89.20 cm/s  SHUNTS MV A velocity: 89.20 cm/s  Systemic VTI:  0.15 m MV E/A ratio:  1.00        Systemic Diam: 2.00 cm Rudean Haskell MD Electronically signed by Rudean Haskell MD Signature Date/Time: 03/28/2021/3:32:49 PM    Final    IR IMAGING GUIDED PORT INSERTION  Result Date: 03/30/2021 INDICATION: 65 year old with metastatic breast cancer. Patient has had previous left chest Port-A-Caths. Right mastectomy and right upper extremity lymphedema. Recent surgery on right humerus. Patient needs central venous access for systemic treatment. EXAM: FLUOROSCOPIC AND ULTRASOUND GUIDED PLACEMENT OF A SUBCUTANEOUS PORT COMPARISON:  None. MEDICATIONS: Moderate sedation ANESTHESIA/SEDATION: Versed 4.0 mg IV; Fentanyl 100 mcg IV; Moderate Sedation Time:  34 minutes The patient was continuously monitored during the procedure by the interventional radiology  nurse under my direct supervision. FLUOROSCOPY TIME:  24 seconds, 6 mGy COMPLICATIONS: None immediate. PROCEDURE: Patient's prior imaging was reviewed and the neck and chest was examined with ultrasound. 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 Patient has extensive collateral vessels in left neck and upper chest region. Previous chest CT demonstrates that the left innominate vein is probably patent but difficult to access. Right jugular vein was widely patent on imaging. Patient has a small amount of subcutaneous tissue in the right upper chest due to previous surgeries and treatments. We discussed placing a port versus tunneled central venous catheter. Patient wanted to avoid the tunneled central venous catheter and attempt a port placement. The procedure, risks, benefits, and alternatives were explained to the patient. Questions regarding the procedure were encouraged and answered. The patient understands and consents to the procedure. Patient was placed supine on the interventional table. Ultrasound confirmed a patent right internal jugular vein. Ultrasound image was saved for documentation. The right chest and neck were cleaned with a skin antiseptic and a sterile drape was placed. Maximal barrier sterile technique was utilized including caps, mask, sterile gowns, sterile gloves, sterile drape, hand hygiene and skin antiseptic. The right neck was anesthetized with 1% lidocaine. Small incision was made in the right neck with a blade. Micropuncture set was placed in the right internal jugular vein with ultrasound guidance. The micropuncture wire was used for measurement purposes. The right chest was anesthetized with 1% lidocaine with epinephrine. #15 blade was used to make an incision and a subcutaneous port pocket was formed. [place] was assembled. Subcutaneous tunnel was formed with a stiff tunneling device. The port catheter  was brought through the subcutaneous tunnel. The port was placed in the subcutaneous pocket. The micropuncture set was exchanged for a peel-away sheath. The catheter was placed through the peel-away sheath and the tip was positioned at the superior cavoatrial junction. Catheter placement was confirmed with fluoroscopy. The port was accessed and flushed with heparinized saline. The port pocket was closed using two layers of absorbable sutures and Dermabond. The vein skin site was closed using a single layer of absorbable suture and Dermabond. Sterile dressings were applied. Patient tolerated the procedure  well without an immediate complication. Ultrasound and fluoroscopic images were taken and saved for this procedure. IMPRESSION: Placement of a subcutaneous port device. Catheter tip at the superior cavoatrial junction. Electronically Signed   By: Markus Daft M.D.   On: 03/30/2021 16:20     Refuses mammography at this point (12/23/2019)   ELIGIBLE FOR AVAILABLE RESEARCH PROTOCOL: no   ASSESSMENT: 65 y.o. [place], [place] woman with stage IV breast cancer, as follows:  (1) status post right mastectomy in 2000, for a pT2 pN2, stage IIIA invasive ductal carcinoma, estrogen receptor positive, progesterone receptor not tested, HER-2 negative (0) by immunohistochemistry  (a) adjuvant chemotherapy with doxorubicin and cyclophosphamide in dose dense fashion x4 followed by paclitaxel in dose dense fashion x4  (b) adjuvant radiation: 30 doses  (c) antiestrogens: Tamoxifen for 5 years, completed 2005  (2) METASTASTIC DISEASE: 2008, involving bones, lungs, and lymph nodes  (a) CA 27-29 is informative  (b) CT of the chest abdomen and pelvis in [facility] finds stable sclerotic metastases (T8, T10, T11, sternum, L4, L5); 0.6 cm left upper lobe lung nodule stable  (3)  prior anti-estrogen treatments:  (a) fulvestrant--progression  (b) exemestane/everolimus--hyperlipidemia, hyperglycemia  (4) prior  chemotherapy treatments:  (a) capecitabine: progression  (b) Abraxane, August 2014 through 11/18/2015: good response but stopped due to neuropathy  (c) gemcitabine 01/20/2016--?; multiple interruptions secondary to infections  (5) radiation therapy:  (a) T spine and Right femur, completed 01/10/2016  (6) letrozole/ palbociclib started 07/13/2016, discontinued 10/26/2020 with evidence of progression  (7) bone treatment:  (a) denosumab/Xgeva--discontinued January 2016 due to osteonecrosis of the jaw  (8) cancer associated pain: Updated 12/01/2020 (a) OxyIR 5 mg QID PRN  (b) PMP Aware reviewed 12/01/2020  (c) OxyContin 20 mg p.o. 3 times daily  (d) bowel prophylaxis: MiraLAX as needed  (9) restaging studies:1  (a) mostly stable bone lesions with evidence of progression at L3-L5; no epidural tumor by total spinal MRI in November 2019  (b) no evidence of progressive lung, lymph node or liver lesions by CT scans of the chest abdomen and pelvis and bone scan December 2019  (c) CT scans abd/pelvis 03/31/2019 shows stable bone metastases, no extraosseous disease  (d) MRI pelvis 04/09/2019 shows bilateral ilium, left acetabulum and lumbar mets, stable  (e) CT abd/pelvis 09/01/2019 shows stable bone mets, slight increase left effusion  (f) CT of the chest 05/05/2020 again shows minimal increase in the left pleural effusion, possible resorption of bone around the left glenoid lesion, otherwise stable  (g) CT of the chest abdomen and pelvis 10/18/2020 again shows no evidence of visceral disease, but there is apparent progression in the bone disease, and some enlargement of the left pleural effusion.  The CA 27-29 has also been rising.  Accordingly her letrozole and palbociclib are being discontinued  (10) palliative radiation 11/18/2018 through 12/02/2018 Site/dose:   1. Lumbar spine; 35 Gy in 14 fractions of 2.5 Gy                      2. Pelvis; 35 Gy in 14 fractions of 2.5 Gy  (11) started  cyclophosphamide, methotrexate and fluorouracil [CMF] 11/10/2020  (a) methotrexate held first dose, during palliative radiation  (B) discontinued after 3 cycles (last dose 12/22/2020)  (C) CT chest 01/23/2021 showed worsening left effusion, cytologically positive  (12) status post left thoracentesis 11/21/2020, with positive cytology  (a) tumor cells are strongly estrogen and progesterone receptor positive, HER-2 negative  (b) foundation 1 requested  on the thoracentesis sample shows a PI K3 CA mutation.  Microsatellite status is stable and the tumor mutation burden is low.  There are mutations in ESR 1, GAT A3, and [place] 6  (C) left pleural drain placed 02/10/2021  (13) status post Right humeral shaft intramedullary nail fixation Right proximal humeral nonunion treatment 01/12/2021 [facility])  (14) alpelisib started January 27, 2021, fulvestrant started 01/26/2021  (a) alpelisib and fulvestrant discontinued June 2022 with HbAic of 9.7  (15) to start Doxil 04/11/2021, to be repeated every 21 days  (a) echo 03/28/2021 shows an ejection fraction in the 60-65% range   PLAN: Mia [name] has recovered from the alpelisib and is ready to start any treatment.  She will receive her first dose of Doxil today.  She has a good understanding of the possible toxicities side effects and complications of this agent and she will particularly let us know if she develops mouth sores or worsening peripheral neuropathy.  I am setting her up for a virtual visit in 1 week just to make sure we troubleshoot any side effects that may develop.  Otherwise she will return in 3 weeks for her next cycle.  Ideally we would proceed to 4 cycles before restaging  She is significantly anemic.  We discussed proceeding to transfusion today.  We are going to hold off but if she can receive some intravenous iron today (she is unable to tolerate oral iron) we will give it.  If not she will receive that at the next treatment.  She has received  iron intravenous previously with no side effects but with good response she tells me.  She will see me again in 3 weeks with her next treatment of Doxil.  She knows to call for any other issue that may develop before then.  Total encounter time 35 minutes.Sarajane Jews C. Baillie Mohammad, MD 04/11/21 2:48 PM Medical Oncology and Hematology [facility] [place], [place] [address] Tel. [phone]    Fax. [phone]   I, Wilburn Mylar, am acting as scribe for Dr. Virgie [name]. Betsie Peckman.  I, Lurline Del MD, have reviewed the above documentation for accuracy and completeness, and I agree with the above.   *Total Encounter Time as defined by the Centers for Medicare and Medicaid Services includes, in addition to the face-to-face time of a patient visit (documented in the note above) non-face-to-face time: obtaining and reviewing outside history, ordering and reviewing medications, tests or procedures, care coordination (communications with other health care professionals or caregivers) and documentation in the medical record.

## 2021-04-11 ENCOUNTER — Inpatient Hospital Stay: Payer: MEDICARE

## 2021-04-11 ENCOUNTER — Other Ambulatory Visit: Payer: Self-pay

## 2021-04-11 ENCOUNTER — Inpatient Hospital Stay: Payer: MEDICARE | Attending: Oncology | Admitting: Oncology

## 2021-04-11 VITALS — BP 104/61 | HR 76 | Temp 97.5°F | Resp 18

## 2021-04-11 VITALS — Wt 178.5 lb

## 2021-04-11 DIAGNOSIS — Z86711 Personal history of pulmonary embolism: Secondary | ICD-10-CM | POA: Insufficient documentation

## 2021-04-11 DIAGNOSIS — C50911 Malignant neoplasm of unspecified site of right female breast: Secondary | ICD-10-CM | POA: Insufficient documentation

## 2021-04-11 DIAGNOSIS — E119 Type 2 diabetes mellitus without complications: Secondary | ICD-10-CM | POA: Insufficient documentation

## 2021-04-11 DIAGNOSIS — Z90722 Acquired absence of ovaries, bilateral: Secondary | ICD-10-CM | POA: Insufficient documentation

## 2021-04-11 DIAGNOSIS — N390 Urinary tract infection, site not specified: Secondary | ICD-10-CM | POA: Diagnosis not present

## 2021-04-11 DIAGNOSIS — G893 Neoplasm related pain (acute) (chronic): Secondary | ICD-10-CM | POA: Diagnosis not present

## 2021-04-11 DIAGNOSIS — C50919 Malignant neoplasm of unspecified site of unspecified female breast: Secondary | ICD-10-CM | POA: Diagnosis not present

## 2021-04-11 DIAGNOSIS — Z7189 Other specified counseling: Secondary | ICD-10-CM

## 2021-04-11 DIAGNOSIS — C7800 Secondary malignant neoplasm of unspecified lung: Secondary | ICD-10-CM

## 2021-04-11 DIAGNOSIS — Z79811 Long term (current) use of aromatase inhibitors: Secondary | ICD-10-CM | POA: Insufficient documentation

## 2021-04-11 DIAGNOSIS — K760 Fatty (change of) liver, not elsewhere classified: Secondary | ICD-10-CM | POA: Insufficient documentation

## 2021-04-11 DIAGNOSIS — C7802 Secondary malignant neoplasm of left lung: Secondary | ICD-10-CM | POA: Insufficient documentation

## 2021-04-11 DIAGNOSIS — Z95828 Presence of other vascular implants and grafts: Secondary | ICD-10-CM | POA: Insufficient documentation

## 2021-04-11 DIAGNOSIS — C778 Secondary and unspecified malignant neoplasm of lymph nodes of multiple regions: Secondary | ICD-10-CM | POA: Insufficient documentation

## 2021-04-11 DIAGNOSIS — Z90710 Acquired absence of both cervix and uterus: Secondary | ICD-10-CM | POA: Diagnosis not present

## 2021-04-11 DIAGNOSIS — M879 Osteonecrosis, unspecified: Secondary | ICD-10-CM | POA: Diagnosis not present

## 2021-04-11 DIAGNOSIS — Z885 Allergy status to narcotic agent status: Secondary | ICD-10-CM | POA: Diagnosis not present

## 2021-04-11 DIAGNOSIS — Z8249 Family history of ischemic heart disease and other diseases of the circulatory system: Secondary | ICD-10-CM | POA: Diagnosis not present

## 2021-04-11 DIAGNOSIS — D509 Iron deficiency anemia, unspecified: Secondary | ICD-10-CM | POA: Insufficient documentation

## 2021-04-11 DIAGNOSIS — Z9221 Personal history of antineoplastic chemotherapy: Secondary | ICD-10-CM | POA: Insufficient documentation

## 2021-04-11 DIAGNOSIS — E785 Hyperlipidemia, unspecified: Secondary | ICD-10-CM | POA: Diagnosis not present

## 2021-04-11 DIAGNOSIS — Z836 Family history of other diseases of the respiratory system: Secondary | ICD-10-CM | POA: Insufficient documentation

## 2021-04-11 DIAGNOSIS — Z7901 Long term (current) use of anticoagulants: Secondary | ICD-10-CM | POA: Diagnosis not present

## 2021-04-11 DIAGNOSIS — R3915 Urgency of urination: Secondary | ICD-10-CM | POA: Diagnosis not present

## 2021-04-11 DIAGNOSIS — Z800 Family history of malignant neoplasm of digestive organs: Secondary | ICD-10-CM | POA: Insufficient documentation

## 2021-04-11 DIAGNOSIS — C7951 Secondary malignant neoplasm of bone: Secondary | ICD-10-CM

## 2021-04-11 DIAGNOSIS — D508 Other iron deficiency anemias: Secondary | ICD-10-CM

## 2021-04-11 DIAGNOSIS — Z170 Estrogen receptor positive status [ER+]: Secondary | ICD-10-CM | POA: Insufficient documentation

## 2021-04-11 DIAGNOSIS — I700 Atherosclerosis of aorta: Secondary | ICD-10-CM | POA: Diagnosis not present

## 2021-04-11 DIAGNOSIS — Z79899 Other long term (current) drug therapy: Secondary | ICD-10-CM | POA: Insufficient documentation

## 2021-04-11 DIAGNOSIS — Z9011 Acquired absence of right breast and nipple: Secondary | ICD-10-CM | POA: Insufficient documentation

## 2021-04-11 DIAGNOSIS — Z923 Personal history of irradiation: Secondary | ICD-10-CM | POA: Diagnosis not present

## 2021-04-11 DIAGNOSIS — Z5111 Encounter for antineoplastic chemotherapy: Secondary | ICD-10-CM | POA: Insufficient documentation

## 2021-04-11 DIAGNOSIS — Z833 Family history of diabetes mellitus: Secondary | ICD-10-CM | POA: Insufficient documentation

## 2021-04-11 DIAGNOSIS — Z803 Family history of malignant neoplasm of breast: Secondary | ICD-10-CM | POA: Insufficient documentation

## 2021-04-11 LAB — COMPREHENSIVE METABOLIC PANEL
ALT: 10 U/L (ref 0–44)
AST: 16 U/L (ref 15–41)
Albumin: 2.5 g/dL — ABNORMAL LOW (ref 3.5–5.0)
Alkaline Phosphatase: 142 U/L — ABNORMAL HIGH (ref 38–126)
Anion gap: 10 (ref 5–15)
BUN: 18 mg/dL (ref 8–23)
CO2: 29 mmol/L (ref 22–32)
Calcium: 9.6 mg/dL (ref 8.9–10.3)
Chloride: 99 mmol/L (ref 98–111)
Creatinine, Ser: 0.71 mg/dL (ref 0.44–1.00)
GFR, Estimated: >60 mL/min (ref >60)
Glucose, Bld: 121 mg/dL — ABNORMAL HIGH (ref 70–99)
Potassium: 4.4 mmol/L (ref 3.5–5.1)
Sodium: 138 mmol/L (ref 135–145)
Total Bilirubin: 0.3 mg/dL (ref 0.3–1.2)
Total Protein: 6.5 g/dL (ref 6.5–8.1)

## 2021-04-11 LAB — CBC WITH DIFFERENTIAL/PLATELET
Abs Immature Granulocytes: 0.5 10*3/uL — ABNORMAL HIGH (ref 0.00–0.07)
Basophils Absolute: 0.1 10*3/uL (ref 0.0–0.1)
Basophils Relative: 1 %
Eosinophils Absolute: 0 10*3/uL (ref 0.0–0.5)
Eosinophils Relative: 0 %
HCT: 25.4 % — ABNORMAL LOW (ref 36.0–46.0)
Hemoglobin: 7.8 g/dL — ABNORMAL LOW (ref 12.0–15.0)
Immature Granulocytes: 4 %
Lymphocytes Relative: 4 %
Lymphs Abs: 0.5 10*3/uL — ABNORMAL LOW (ref 0.7–4.0)
MCH: 27 pg (ref 26.0–34.0)
MCHC: 30.7 g/dL (ref 30.0–36.0)
MCV: 87.9 fL (ref 80.0–100.0)
Monocytes Absolute: 1 10*3/uL (ref 0.1–1.0)
Monocytes Relative: 8 %
Neutro Abs: 10.6 10*3/uL — ABNORMAL HIGH (ref 1.7–7.7)
Neutrophils Relative %: 83 %
Platelets: 550 10*3/uL — ABNORMAL HIGH (ref 150–400)
RBC: 2.89 MIL/uL — ABNORMAL LOW (ref 3.87–5.11)
RDW: 16 % — ABNORMAL HIGH (ref 11.5–15.5)
WBC: 12.7 10*3/uL — ABNORMAL HIGH (ref 4.0–10.5)
nRBC: 0 % (ref 0.0–0.2)

## 2021-04-11 MED ORDER — DEXTROSE 5 % IV SOLN
Freq: Once | INTRAVENOUS | Status: AC
  Filled 2021-04-11: qty 250

## 2021-04-11 MED ORDER — SODIUM CHLORIDE 0.9% FLUSH
10.0000 mL | INTRAVENOUS | Status: DC | PRN
  Filled 2021-04-11: qty 10

## 2021-04-11 MED ORDER — HEPARIN SOD (PORK) LOCK FLUSH 100 UNIT/ML IV SOLN
500.0000 [IU] | Freq: Once | INTRAVENOUS | Status: DC | PRN
  Filled 2021-04-11: qty 5

## 2021-04-11 MED ORDER — DOXORUBICIN HCL LIPOSOMAL CHEMO INJECTION 2 MG/ML
40.0000 mg/m2 | Freq: Once | INTRAVENOUS | Status: AC
  Administered 2021-04-11: 80 mg via INTRAVENOUS
  Filled 2021-04-11: qty 10

## 2021-04-11 MED ORDER — SODIUM CHLORIDE 0.9 % IV SOLN
10.0000 mg | Freq: Once | INTRAVENOUS | Status: AC
  Administered 2021-04-11: 10 mg via INTRAVENOUS
  Filled 2021-04-11: qty 10

## 2021-04-11 MED ORDER — SODIUM CHLORIDE 0.9% FLUSH
10.0000 mL | Freq: Once | INTRAVENOUS | Status: AC
Start: 2021-04-11 — End: 2021-04-11
  Administered 2021-04-11: 10 mL
  Filled 2021-04-11: qty 10

## 2021-04-11 NOTE — Patient Instructions (Signed)
[facility]  Discharge Instructions: Thank you for choosing [facility] to provide your oncology and hematology care.   If you have a lab appointment with the [facility], please go directly to the [facility] and check in at the registration area.   Wear comfortable clothing and clothing appropriate for easy access to any Portacath or PICC line.   We strive to give you quality time with your provider. You may need to reschedule your appointment if you arrive late (15 or more minutes).  Arriving late affects you and other patients whose appointments are after yours.  Also, if you miss three or more appointments without notifying the office, you may be dismissed from the clinic at the provider's discretion.      For prescription refill requests, have your pharmacy contact our office and allow 72 hours for refills to be completed.    Today you received the following chemotherapy and/or immunotherapy agents: Doxil      To help prevent nausea and vomiting after your treatment, we encourage you to take your nausea medication as directed.  BELOW ARE SYMPTOMS THAT SHOULD BE REPORTED IMMEDIATELY: *FEVER GREATER THAN 100.4 F (38 C) OR HIGHER *CHILLS OR SWEATING *NAUSEA AND VOMITING THAT IS NOT CONTROLLED WITH YOUR NAUSEA MEDICATION *UNUSUAL SHORTNESS OF BREATH *UNUSUAL BRUISING OR BLEEDING *URINARY PROBLEMS (pain or burning when urinating, or frequent urination) *BOWEL PROBLEMS (unusual diarrhea, constipation, pain near the anus) TENDERNESS IN MOUTH AND THROAT WITH OR WITHOUT PRESENCE OF ULCERS (sore throat, sores in mouth, or a toothache) UNUSUAL RASH, SWELLING OR PAIN  UNUSUAL VAGINAL DISCHARGE OR ITCHING   Items with * indicate a potential emergency and should be followed up as soon as possible or go to the Emergency Department if any problems should occur.  Please show the CHEMOTHERAPY ALERT CARD or IMMUNOTHERAPY ALERT CARD at check-in to the  Emergency Department and triage nurse.  Should you have questions after your visit or need to cancel or reschedule your appointment, please contact [facility]  Dept: [phone]  and follow the prompts.  Office hours are 8:00 a.m. to 4:30 p.m. Monday - Friday. Please note that voicemails left after 4:00 p.m. may not be returned until the following business day.  We are closed weekends and major holidays. You have access to a nurse at all times for urgent questions. Please call the main number to the clinic Dept: [phone] and follow the prompts.   For any non-urgent questions, you may also contact your provider using MyChart. We now offer e-Visits for anyone 45 and older to request care online for non-urgent symptoms. For details visit [email or web address].   Also download the MyChart app! Go to the app store, search "MyChart", open the app, select [facility], and log in with your MyChart username and password.  Due to Covid, a mask is required upon entering the hospital/clinic. If you do not have a mask, one will be given to you upon arrival. For doctor visits, patients may have 1 support person aged 64 or older with them. For treatment visits, patients cannot have anyone with them due to current Covid guidelines and our immunocompromised population.   Doxorubicin Liposomal injection What is this medication? LIPOSOMAL DOXORUBICIN (LIP oh som al dox oh ROO bi sin) is a chemotherapy drug. This medicine is used to treat many kinds of cancer like Kaposi's sarcoma,multiple myeloma, and ovarian cancer. This medicine may be used for other purposes; ask your  health care provider orpharmacist if you have questions. COMMON BRAND NAME(S): Doxil, Lipodox What should I tell my care team before I take this medication? They need to know if you have any of these conditions: blood disorders heart disease infection (especially a virus infection such as chickenpox, cold  sores, or herpes) liver disease recent or ongoing radiation therapy an unusual or allergic reaction to doxorubicin, other chemotherapy agents, soybeans, other medicines, foods, dyes, or preservatives pregnant or trying to get pregnant breast-feeding How should I use this medication? This drug is given as an infusion into a vein. It is administered in a hospital or clinic by a specially trained health care professional. If you have pain, swelling, burning or any unusual feeling around the site of your injection,tell your health care professional right away. Talk to your pediatrician regarding the use of this medicine in children.Special care may be needed. Overdosage: If you think you have taken too much of this medicine contact apoison control center or emergency room at once. NOTE: This medicine is only for you. Do not share this medicine with others. What if I miss a dose? It is important not to miss your dose. Call your doctor or health careprofessional if you are unable to keep an appointment. What may interact with this medication? Do not take this medicine with any of the following medications: zidovudine This medicine may also interact with the following medications: medicines to increase blood counts like filgrastim, pegfilgrastim, sargramostim vaccines Talk to your doctor or health care professional before taking any of thesemedicines: acetaminophen aspirin ibuprofen ketoprofen naproxen This list may not describe all possible interactions. Give your health care provider a list of all the medicines, herbs, non-prescription drugs, or dietary supplements you use. Also tell them if you smoke, drink alcohol, or use illegaldrugs. Some items may interact with your medicine. What should I watch for while using this medication? Your condition will be monitored carefully while you are receiving thismedicine. You may need blood work done while you are taking this medicine. This drug may  make you feel generally unwell. This is not uncommon, as chemotherapy can affect healthy cells as well as cancer cells. Report any side effects. Continue your course of treatment even though you feel ill unless yourdoctor tells you to stop. Your urine may turn orange-red for a few days after your dose. This is notblood. If your urine is dark or brown, call your doctor. In some cases, you may be given additional medicines to help with side effects.Follow all directions for their use. Talk to your doctor about your risk of cancer. You may be more at risk forcertain types of cancers if you take this medicine. Do not become pregnant while taking this medicine or for 6 months after stopping it. Women should inform their healthcare professional if they wish to become pregnant or think they may be pregnant. Men should not father a child while taking this medicine and for 6 months after stopping it. There is a potential for serious side effects to an unborn child. Talk to your health care professional or pharmacist for more information. Do not breast-feed an infantwhile taking this medicine. This medicine has caused ovarian failure in some women. This medicine may make it more difficult to get pregnant. Talk to your healthcare professional if [place] concerned about your fertility. This medicine has caused decreased sperm counts in some men. This may make it more difficult to father a child. Talk to your healthcare professional if [place] concerned about  your fertility. This medicine may cause a decrease in Co-Enzyme Q-10. You should make sure that you get enough Co-Enzyme Q-10 while you are taking this medicine. Discuss thefoods you eat and the vitamins you take with your health care professional. What side effects may I notice from receiving this medication? Side effects that you should report to your doctor or health care professionalas soon as possible: allergic reactions like skin rash, itching or hives,  swelling of the face, lips, or tongue low blood counts - this medicine may decrease the number of white blood cells, red blood cells and platelets. You may be at increased risk for infections and bleeding. signs of hand-foot syndrome - tingling or burning, redness, flaking, swelling, small blisters, or small sores on the palms of your hands or the soles of your feet signs of infection - fever or chills, cough, sore throat, pain or difficulty passing urine signs of decreased platelets or bleeding - bruising, pinpoint red spots on the skin, black, tarry stools, blood in the urine signs of decreased red blood cells - unusually weak or tired, fainting spells, lightheadedness back pain, chills, facial flushing, fever, headache, tightness in the chest or throat during the infusion breathing problems chest pain fast, irregular heartbeat mouth pain, redness, sores pain, swelling, redness at site where injected pain, tingling, numbness in the hands or feet swelling of ankles, feet, or hands vomiting Side effects that usually do not require medical attention (report to yourdoctor or health care professional if they continue or are bothersome): diarrhea hair loss loss of appetite nail discoloration or damage nausea red or watery eyes red colored urine stomach upset This list may not describe all possible side effects. Call your doctor for medical advice about side effects. You may report side effects to FDA at1-800-FDA-1088. Where should I keep my medication? This drug is given in a hospital or clinic and will not be stored at home. NOTE: This sheet is a summary. It may not cover all possible information. If you have questions about this medicine, talk to your doctor, pharmacist, orhealth care provider.  2022 [employer]vier/Gold Standard (2018-05-26 15:13:26)

## 2021-04-12 ENCOUNTER — Telehealth: Payer: Self-pay | Admitting: *Deleted

## 2021-04-12 LAB — CANCER ANTIGEN 27.29: CA 27.29: 194.4 U/mL — ABNORMAL HIGH (ref 0.0–38.6)

## 2021-04-12 NOTE — Telephone Encounter (Signed)
-----   Message from Isabel Caprice, RN sent at 04/11/2021  4:56 PM EDT ----- Regarding: First time Doxil, Dr. [name] patient. Please give patient follow up phone call, first time Doxil Dr. [name] patient. Thank you!

## 2021-04-12 NOTE — Telephone Encounter (Signed)
Called pt to see how she did with her treatment & she reports doing well & surprised & pleased how well she feels this am.  She knows how to reach [place] if needed.

## 2021-04-14 ENCOUNTER — Telehealth: Payer: Self-pay | Admitting: Oncology

## 2021-04-14 NOTE — Telephone Encounter (Signed)
Scheduled per 7/12 los. Called and spoke with pt confirmed 7/28 appt

## 2021-04-19 ENCOUNTER — Other Ambulatory Visit: Payer: Self-pay

## 2021-04-19 ENCOUNTER — Ambulatory Visit: Payer: MEDICARE | Attending: Oncology | Admitting: Physical Therapy

## 2021-04-19 DIAGNOSIS — M25611 Stiffness of right shoulder, not elsewhere classified: Secondary | ICD-10-CM | POA: Diagnosis not present

## 2021-04-19 DIAGNOSIS — I972 Postmastectomy lymphedema syndrome: Secondary | ICD-10-CM

## 2021-04-19 DIAGNOSIS — G8929 Other chronic pain: Secondary | ICD-10-CM | POA: Diagnosis not present

## 2021-04-19 DIAGNOSIS — M25511 Pain in right shoulder: Secondary | ICD-10-CM | POA: Insufficient documentation

## 2021-04-19 NOTE — Therapy (Signed)
[place], [place], [address] Phone: [phone]   Fax:  [phone]  Physical Therapy Evaluation  Patient Details  Name: Mia [name] MRN: [id number] Date of Birth: Jul 29, 1956 Referring Provider (PT): Dr. Gunnar Bulla Magrinat   Encounter Date: 04/19/2021   PT End of Session - 04/19/21 1940     Visit Number 1    Number of Visits 9   visits may be limited by chemotherapy   Date for PT Re-Evaluation 05/22/21    PT Start Time 1400    PT Stop Time 1500    PT Time Calculation (min) 60 min    Activity Tolerance Patient tolerated treatment well    Behavior During Therapy [facility] for tasks assessed/performed             Past Medical History:  Diagnosis Date   Breast cancer metastasized to bone [facility])    Diabetes mellitus without complication ([place])    Dyspnea    History of radiation therapy 11/18/2018-12/02/2018   lumbar spine and pelvis   Dr [name]   History of radiation therapy 11/08/2020-11/18/2020   right arm   Dr [name]   Hyperlipidemia    Hypertension    Lymphedema of right arm    Neuropathy associated with cancer ([place])    Obesity (BMI 35.0-39.9 without comorbidity)    PONV (postoperative nausea and vomiting)    Pulmonary embolism ([place]) 12/2020    Past Surgical History:  Procedure Laterality Date   CATARACT EXTRACTION Left    HUMERUS IM NAIL Right 01/11/2021   Procedure: INTRAMEDULLARY (IM) NAIL HUMERAL;  Surgeon: Hiram Gash, MD;  Location: [facility];  Service: Orthopedics;  Laterality: Right;   HYSTERECTOMY ABDOMINAL WITH SALPINGO-OOPHORECTOMY     IR IMAGING GUIDED PORT INSERTION  03/30/2021   IR PERC PLEURAL DRAIN W/INDWELL CATH W/IMG GUIDE  02/10/2021   MODIFIED RADICAL MASTECTOMY Right     There were no vitals filed for this visit.    Subjective Assessment - 04/19/21 1409     Subjective Pt wants to be able to get  back into her seeve again. She has daytime flat knee sleeve and  glove.  She does not wear anything at night.  Pt does not want to do bandaging. She has alot going on right now with multiple bone mets and now starting a new chemotherapy. She has just completed home health PT and recovery from her shoulder surgery. She just wants to get manual lymph drainage while sitting in her wheelchair as it is too painful to transfer to the mat table    Patient is accompained by: Family member   Husband [name]   Pertinent History Right breast cancer diagnosed 2000 with mastectomy with 16 lymph nodes removed (4 positive), chemo and radiation. 2008 had metastases in bones and lungs, and has been on treatment since.Lymphedema began in 2001 and has been treatment with compression sleeves and self MLD  Moved to [place] in 2019 and is seeing Dr. [name] now  Right shoulder fracture in January but did not find out until mid April and she had an ORIF with pins and rod in April 2022. During  this time lymphedema in the arm increased Fracture is now healed. Pt is receiving chemotherapy every 3 weeks. past history . Diabetes, high blood pressure, high cholesterol.    Patient Stated Goals to get back into sleeve again    Currently in Pain? No/denies  [facility] PT Assessment - 04/19/21 0001       Assessment   Medical Diagnosis stage IV right breast cancer    Referring Provider (PT) Dr. Gunnar Bulla Magrinat    Onset Date/Surgical Date 12/31/98   R mastectomy   Hand Dominance Right    Prior Therapy 2019 for lymphedema      Precautions   Precautions Other (comment)    Precaution Comments metastatic breast cancer including bones      Restrictions   Weight Bearing Restrictions No      Balance Screen   Has the patient fallen in the past 6 months Yes    How many times? once    Has the patient had a decrease in activity level because of a fear of falling?  No    Is the patient reluctant to leave their home because of a fear of falling?  No      Home Environment   Living  Environment Private residence    Living Arrangements Spouse/significant other   husband [name]   Available Help at Discharge Family    Type of [place]   town home     Prior Function   Level of Independence Needs assistance with ADLs;Needs assistance with homemaking;Needs assistance with transfers;Independent with household mobility with device    Vocation On disability    Leisure unable to exercise      Cognition   Overall Cognitive Status Within Functional Limits for tasks assessed      Observation/Other Assessments   Observations RUE very swollen  pt arrives in wheelchair and asks to remain in chair . She is on oxygen      Posture/Postural Control   Posture/Postural Control Postural limitations    Postural Limitations Rounded Shoulders;Forward head      AROM   Overall AROM  Deficits    Overall AROM Comments right shoulder motion about 2/5 and unable to lift arm much due to decreased strength and increased heaviness from lymphedema in arm Only about 30 degrees of shoulder flexion and abduction available with assist but it is enough for caregiver to perform heygiene      Palpation   Palpation comment lymphostatic fibrosis in right arm, especially right forearm and hand               LYMPHEDEMA/ONCOLOGY QUESTIONNAIRE - 04/19/21 0001       Type   Cancer Type right breast, stage IV      Surgeries   Mastectomy Date 12/31/98    Axillary Lymph Node Dissection Date 12/31/98    Number Lymph Nodes Removed 16   4 were positive     Date Lymphedema/Swelling Started   Date 03/01/00      Treatment   Active Chemotherapy Treatment Yes    Past Chemotherapy Treatment Yes    Active Radiation Treatment No    Past Radiation Treatment Yes    Past Hormone Therapy Yes    Drug Name Tamoxifen      What other symptoms do you have   Are you Having Heaviness or Tightness Yes    Are you having Pain Yes    Are you having pitting edema Yes    Body Site throughout R UE    Is it Hard or  Difficult finding clothes that fit Yes    Do you have infections No    Is there Decreased scar mobility Yes    Stemmer Sign Yes    Other Symptoms Pt states she has lost weight  since last visit      Right Upper Extremity Lymphedema   15 cm Proximal to Olecranon Process 47 cm    10 cm Proximal to Olecranon Process 43 cm    Olecranon Process 32.5 cm    15 cm Proximal to Ulnar Styloid Process 36.5 cm    10 cm Proximal to Ulnar Styloid Process 33 cm    Just Proximal to Ulnar Styloid Process 20 cm    Across Hand at [employer] 19.5 cm    At [place] of 2nd Digit 6.5 cm      Left Upper Extremity Lymphedema   15 cm Proximal to Olecranon Process 38.5 cm    10 cm Proximal to Olecranon Process 38.5 cm    Olecranon Process 26.6 cm    15 cm Proximal to Ulnar Styloid Process 25.5 cm    10 cm Proximal to Ulnar Styloid Process 24 cm    Just Proximal to Ulnar Styloid Process 16.9 cm    Across Hand at [employer] 17.5 cm    At [facility] of 2nd Digit 5.8 cm                   Flowsheet Row Outpatient [facility]  Lymphedema Life Impact Scale Total Score 19.12 %       Objective measurements completed on examination: See above findings.       [place] Adult PT Treatment/Exercise - 04/19/21 0001       Manual Therapy   Manual Therapy Edema management;Manual Lymphatic Drainage (MLD)    Edema Management applies small tg soft to lower arm and medium tg soft to upper arm. Showed pt website for [facility] and Tribute wrap if she would lie to consider those. Pt took the information and will look into them further at home    Manual Lymphatic Drainage (MLD) with pt sitting in chair, encouraged diaphragmatic breathing, statinary circles at shoulder avoiding port, then to left upper arm to hand and return along pathways with lower arm elevated as much as possible.                    PT Education - 04/19/21 1939     Education  Details website for [employer] and Tribute Night wrap for pt to consider    Person(s) Educated Patient;Spouse    Methods Explanation    Comprehension Verbalized understanding                 PT Long Term Goals - 04/19/21 1947       PT LONG TERM GOAL #1   Title Pt will have reduction of circumference of right UE at 15 cm proximal to ulnar styloid by 2 cm    Baseline on 04/19/2021: 33 cm    Time 4    Period Weeks    Status New      PT LONG TERM GOAL #2   Title Pt/caregiver will report they can do manual lymph drainage to right arm and apply compression to help control lymphedema of right arm    Time 4    Period Weeks    Status New                    Plan - 04/19/21 1941     Clinical Impression Statement Pt comes back to PT with chronic lymphedema in her right arm that she has not been able to manage with her compression sleeve becasue  of pathologic humeral fracture and subsequent surgery.  She does not want to do bandaging but does want MLD and is open to other more "user friendly" methods of compression. She may consider [place] or a [place] Aloe as she knows it will be difficult to get her sleeve on even if her arm reduces. Pt has significant mobility limitations with pain due to multiple skeletal metastasis. She does not want to address those here and only focus on the lymphedema managment as she can from her wheelchair    Personal Factors and Comorbidities Comorbidity 3+    Comorbidities metastatic breat cancer with multiple skeletal lesions , severely limited shoulder ROM, ongling chemotherapy    Examination-Activity Limitations Bathing;Hygiene/Grooming;Lift;Stairs;Locomotion Level;Stand;Reach Overhead;Carry;Transfers;Dressing;Bed Mobility;Caring for Others;Toileting;Squat    Examination-Participation Restrictions Laundry;Cleaning;Shop;Community Activity;Meal Prep;Driving;Yard Work    [employer]chant [employer] officer Evolving/Moderate complexity    Clinical  Decision Making Moderate    Rehab Potential Fair    PT Frequency 2x / week    PT Duration 4 weeks    PT Treatment/Interventions ADLs/Self Care Home Management;Patient/family education;Manual lymph drainage;Manual techniques;Taping;Therapeutic exercise;Therapeutic activities;Passive range of motion;Vasopneumatic Device    PT Next Visit Plan how did tg soft help? perform manual lymph drainage to right UE with patient in wheelchair, assist with edema wear or tribute night as indicated, encourage remedial exercise as she is able    Consulted and Agree with Plan of Care Patient    Family Member Consulted husband             Patient will benefit from skilled therapeutic intervention in order to improve the following deficits and impairments:  Pain, Postural dysfunction, Impaired UE functional use, Increased fascial restricitons, Decreased strength, Decreased activity tolerance, Decreased range of motion, Increased edema, Decreased scar mobility  Visit Diagnosis: Postmastectomy lymphedema  Chronic right shoulder pain  Stiffness of right shoulder, not elsewhere classified     Problem List Patient Active Problem List   Diagnosis Date Noted   Port-A-Cath in place 04/11/2021   Iron deficiency anemia 04/11/2021   Pleural effusion 01/05/2021   Acute respiratory failure with hypoxia ([place]) 01/05/2021   Hypotension 01/05/2021   AKI (acute kidney injury) ([place]) 01/05/2021   Hyponatremia 01/05/2021   Neutropenia ([place]) 01/05/2021   Shoulder pain, right 01/05/2021   Goals of care, counseling/discussion 09/29/2019   Paronychia of thumb, left 02/03/2019   Drug-induced neutropenia ([place]) 12/04/2018   HTN (hypertension) 06/03/2018   HLD (hyperlipidemia) 06/03/2018   Metastatic breast cancer ([place]) 03/12/2018   Bone metastases ([place]) 03/12/2018   Lung metastases ([place]) 03/12/2018   Osteonecrosis ([place]) 03/12/2018   Peripheral neuropathy due to chemotherapy ([place]) 03/12/2018   Diabetes mellitus type  2 in obese ([place]) 03/12/2018   Hepatic steatosis 03/12/2018   Aortic atherosclerosis ([place]) 03/12/2018   Donato Heinz. Owens Shark PT  Norwood Levo 04/19/2021, 7:50 PM  [place], [place], [address] Phone: [phone]   Fax:  [phone]  Name: Mia [name] MRN: [id number] Date of Birth: 1956/06/06

## 2021-04-21 ENCOUNTER — Other Ambulatory Visit: Payer: Self-pay | Admitting: *Deleted

## 2021-04-21 ENCOUNTER — Encounter: Payer: Self-pay | Admitting: Oncology

## 2021-04-21 ENCOUNTER — Telehealth: Payer: Self-pay | Admitting: *Deleted

## 2021-04-21 ENCOUNTER — Inpatient Hospital Stay: Payer: MEDICARE

## 2021-04-21 ENCOUNTER — Other Ambulatory Visit: Payer: Self-pay

## 2021-04-21 DIAGNOSIS — R309 Painful micturition, unspecified: Secondary | ICD-10-CM

## 2021-04-21 DIAGNOSIS — C7802 Secondary malignant neoplasm of left lung: Secondary | ICD-10-CM | POA: Diagnosis not present

## 2021-04-21 DIAGNOSIS — Z5111 Encounter for antineoplastic chemotherapy: Secondary | ICD-10-CM | POA: Diagnosis not present

## 2021-04-21 DIAGNOSIS — C50919 Malignant neoplasm of unspecified site of unspecified female breast: Secondary | ICD-10-CM

## 2021-04-21 DIAGNOSIS — C778 Secondary and unspecified malignant neoplasm of lymph nodes of multiple regions: Secondary | ICD-10-CM | POA: Diagnosis not present

## 2021-04-21 DIAGNOSIS — C7951 Secondary malignant neoplasm of bone: Secondary | ICD-10-CM | POA: Diagnosis not present

## 2021-04-21 DIAGNOSIS — Z170 Estrogen receptor positive status [ER+]: Secondary | ICD-10-CM | POA: Diagnosis not present

## 2021-04-21 DIAGNOSIS — C50911 Malignant neoplasm of unspecified site of right female breast: Secondary | ICD-10-CM | POA: Diagnosis not present

## 2021-04-21 LAB — CBC WITH DIFFERENTIAL (CANCER CENTER ONLY)
Abs Immature Granulocytes: 0.08 10*3/uL — ABNORMAL HIGH (ref 0.00–0.07)
Basophils Absolute: 0 10*3/uL (ref 0.0–0.1)
Basophils Relative: 1 %
Eosinophils Absolute: 0.1 10*3/uL (ref 0.0–0.5)
Eosinophils Relative: 1 %
HCT: 27.4 % — ABNORMAL LOW (ref 36.0–46.0)
Hemoglobin: 8.3 g/dL — ABNORMAL LOW (ref 12.0–15.0)
Immature Granulocytes: 1 %
Lymphocytes Relative: 4 %
Lymphs Abs: 0.3 10*3/uL — ABNORMAL LOW (ref 0.7–4.0)
MCH: 27.1 pg (ref 26.0–34.0)
MCHC: 30.3 g/dL (ref 30.0–36.0)
MCV: 89.5 fL (ref 80.0–100.0)
Monocytes Absolute: 0.2 10*3/uL (ref 0.1–1.0)
Monocytes Relative: 3 %
Neutro Abs: 7.8 10*3/uL — ABNORMAL HIGH (ref 1.7–7.7)
Neutrophils Relative %: 90 %
Platelet Count: 465 10*3/uL — ABNORMAL HIGH (ref 150–400)
RBC: 3.06 MIL/uL — ABNORMAL LOW (ref 3.87–5.11)
RDW: 16.2 % — ABNORMAL HIGH (ref 11.5–15.5)
WBC Count: 8.6 10*3/uL (ref 4.0–10.5)
nRBC: 0 % (ref 0.0–0.2)

## 2021-04-21 LAB — URINALYSIS, COMPLETE (UACMP) WITH MICROSCOPIC
Bilirubin Urine: NEGATIVE
Glucose, UA: NEGATIVE mg/dL
Ketones, ur: NEGATIVE mg/dL
Nitrite: NEGATIVE
Protein, ur: 30 mg/dL — AB
Specific Gravity, Urine: 1.012 (ref 1.005–1.030)
WBC, UA: >50 WBC/hpf — ABNORMAL HIGH (ref 0–5)
pH: 5 (ref 5.0–8.0)

## 2021-04-21 MED ORDER — CIPROFLOXACIN HCL 250 MG PO TABS: 250.0000 mg | ORAL_TABLET | Freq: Two times a day (BID) | ORAL | 0 refills | Status: DC

## 2021-04-21 NOTE — Telephone Encounter (Signed)
This RN spoke with pt per her My Chart message requesting an antibiotic due to painful urination with a foul odor.  This RN informed pt of need for urine sample to best treat symptoms as well as CBC for evaluation due to currently under chemotherapy with Doxil given on 04/11/2021.  Pt will come in this afternoon at 115pm for lab only.  Orders placed per above.

## 2021-04-24 ENCOUNTER — Other Ambulatory Visit: Payer: Self-pay | Admitting: Oncology

## 2021-04-24 LAB — URINE CULTURE: Culture: >=100000 — AB

## 2021-04-27 ENCOUNTER — Inpatient Hospital Stay (HOSPITAL_BASED_OUTPATIENT_CLINIC_OR_DEPARTMENT_OTHER): Payer: MEDICARE | Admitting: Oncology

## 2021-04-27 DIAGNOSIS — C7800 Secondary malignant neoplasm of unspecified lung: Secondary | ICD-10-CM | POA: Diagnosis not present

## 2021-04-27 DIAGNOSIS — C50919 Malignant neoplasm of unspecified site of unspecified female breast: Secondary | ICD-10-CM | POA: Diagnosis not present

## 2021-04-27 DIAGNOSIS — C7951 Secondary malignant neoplasm of bone: Secondary | ICD-10-CM

## 2021-04-27 MED ORDER — OXYCODONE-ACETAMINOPHEN 10-325 MG PO TABS: 1.0000 | ORAL_TABLET | Freq: Two times a day (BID) | ORAL | 0 refills | Status: DC | PRN

## 2021-04-27 NOTE — Progress Notes (Signed)
[place]  Telephone:([phone] Fax:(336) [phone]    ID: Mia [name] DOB: 1955/12/28  MR#: [id number]  ZLD#:[id number]  Patient Care Team: Ronnald Nian, DO as PCP - General (Family Medicine) Raymir Frommelt, Virgie Dad, MD as Consulting Physician (Oncology) [name], [place]  I connected with Shann Medal on 04/27/21 at 10:30 AM EDT by video conference and verified that I am speaking with the correct person using two identifiers.   I discussed the limitations, risks, security and privacy concerns of performing an evaluation and management service by telemedicine and the availability of in-person appointments. I also discussed with the patient that there may be a patient responsible charge related to this service. The patient expressed understanding and agreed to proceed.   Other persons participating in the visit and their role in the encounter: None  Patient's location: Home Provider's location: [place]  Total time spent: 20 min   CHIEF COMPLAINT: Estrogen receptor positive stage IV breast cancer (s/p right mastectomy)  CURRENT TREATMENT: Doxil, rivaroxaban, Venofer   INTERVAL HISTORY: Mia [name] was contacted today for follow-up of her estrogen receptor positive stage IV breast cancer.    She was switched to Doxil at her last visit on 04/11/2021.  She tolerated that remarkably well.  She has had no nausea or vomiting, no increased fatigue, no mouth sores, no plantar palmar erythrodysesthesia, no change in bowel or bladder habits, and her port has worked well.  Echo 03/28/2021 showed an ejection fraction in the 60-65% range.   REVIEW OF SYSTEMS: Mia [name] tells me her pain is currently well controlled.  She takes a Percocet every morning and that takes care of it.  She does use oxygen 24/7 at present.  She does not have constipation problems which she knows how to deal with.  Overall she is pleased that she is  tolerating the new treatment well.   COVID 19 VACCINATION STATUS: [place] x2, with booster 08/16/2020   HISTORY OF CURRENT ILLNESS: From the original intake note:  We have reviewed the medical records from [place], which is the source of the information below:  Mia [name] was initially diagnosed with Stage IIIA (T2N2M0) invasive ductal carcinoma, estrogen receptor positive and HER2 negative right breast cancer in 2000. She underwent right mastectomy, with 4 positive metastatic lymph nodes. She had chemotherapy with taxol and cytoxan and fulvestrant in the past. She had radiation and completed 5 years of tamoxifen.   She was diagnosed with Stage IV disease 04/18/2005 with metastases to the bone, lung, and additional nodes. She was on capecitabine but was discontinued after rising CA 27-29 and scans.   She started anastrozole and everolimus with Delton See in April 2014. She tolerated this well, but this was discontinued in July 2014 due to abnormal blood tests, hyperlipidemia and hyperglycemia.  She began Abraxane 166m /m2 and Xgeva in August 2014 weekly x3 with 1 week off. She tolerated this treatment well. She discontinued Xgeva January 2016 due to osteonecrosis of the jaw and mouth issues. She continued abraxane alone until her last dose on 11/18/2015 due to neuropathy and disease progression with restaging studies on 11/23/2015 with a CT chest abdomen and pelvis and one scan showing skull, sternal and femoral lesions.   She was switched to gemcitabine starting 01/20/2016 weekly x3 and 1 week off. This was discontinued on 06/15/2016 due to a port related jugular clot on the right side. She was given Lovenox for 3 months.  She was switched  to Letrozole 2.5 mg and palbociclib 125 mg starting 07/13/2016. She tolerated this well. Restaging studies with CT chest abdomen and pelvis and bone scan on 07/01/2017 showed stable/ improving lesions. CA 27-29 was stable.  During the last few  months of follow up in [place], she completed restaging studies with a CT chest abdomen and pelvis at [facility] on 12/17/2017 showing: A 6 mm pulmonary nodule in the left upper lobe laterally. Progression of metastatic disease to the bones at the L4 and L5 vertebral bodies. Sclerotic metastases at T8, T10, an T11, are similar to previous exams. Sclerotic metastases in the sternum stable. Hepatic steatosis. Aortic atherosclerosis.  Most recent CA-55-29 (January 2019) was 58.  Most recent hemoglobin A1c was 7.1 according to the patient  The patient's subsequent history is as detailed below.   PAST MEDICAL HISTORY: Past Medical History:  Diagnosis Date   Breast cancer metastasized to bone [facility])    Diabetes mellitus without complication ([place])    Dyspnea    History of radiation therapy 11/18/2018-12/02/2018   lumbar spine and pelvis   Dr [name]   History of radiation therapy 11/08/2020-11/18/2020   right arm   Dr [name]   Hyperlipidemia    Hypertension    Lymphedema of right arm    Neuropathy associated with cancer ([place])    Obesity (BMI 35.0-39.9 without comorbidity)    PONV (postoperative nausea and vomiting)    Pulmonary embolism ([place]) 12/2020     PAST SURGICAL HISTORY: Past Surgical History:  Procedure Laterality Date   CATARACT EXTRACTION Left    HUMERUS IM NAIL Right 01/11/2021   Procedure: INTRAMEDULLARY (IM) NAIL HUMERAL;  Surgeon: Hiram Gash, MD;  Location: [facility];  Service: Orthopedics;  Laterality: Right;   HYSTERECTOMY ABDOMINAL WITH SALPINGO-OOPHORECTOMY     IR IMAGING GUIDED PORT INSERTION  03/30/2021   IR PERC PLEURAL DRAIN W/INDWELL CATH W/IMG GUIDE  02/10/2021   MODIFIED RADICAL MASTECTOMY Right      FAMILY HISTORY: Family History  Problem Relation Age of Onset   Diabetes Mother    Breast cancer Mother 83   Cerebral aneurysm Mother    Pulmonary embolism Father    Colon cancer Father 10   Breast cancer Sister 88       noninvasive   Breast cancer Paternal  Grandmother 79   The patient reports she had negative genetic testing at [facility] 2014, report not available. --The patient's father died at age 61 due to a PE, and he also had a history of colon cancer diagnosed at age 72. The patient's mother died at age 4 due to diabetes and survived a cerebral aneurysm. The patient's mother also had a history of breast cancer diagnosed at age 75.  The patient has no brothers and 1 sister. The patient's sister was diagnosed with non invasive breast cancer at age 47. There was a paternal grandmother diagnosed with breast cancer at age 26. The patient denies a family history of ovarian cancer.    GYNECOLOGIC HISTORY:  Patient's last menstrual period was 10/01/2006. Menarche: 65 years old Age at first live birth: 65 years old She is Tensed P2.  She is status post hysterectomy with bilateral salpingo- oophorectomy 12/23/2006 with benign pathology ([id number]) She never used HRT.    SOCIAL HISTORY:  Mia [name] is disabled due to her breast cancer. She used to be Surveyor, quantity for a cardiology office. Her husband, [name] is in the process of retiring. He is a Electrical engineer for  a power company. The patient's daughter, [name], lives in [place] and works as a Teaching laboratory technician. The patient's second daughter, [name] is a stay at home mom and is a special needs teacher, who will soon move to [facility] The patient plans on attending [employer].     ADVANCED DIRECTIVES: In the absence of any documentation to the contrary, the patient's spouse is their HCPOA.    HEALTH MAINTENANCE: Social History   Tobacco Use   Smoking status: Never   Smokeless tobacco: Never  Vaping Use   Vaping Use: Never used  Substance Use Topics   Alcohol use: Not Currently   Drug use: Never    Colonoscopy: 2017   PAP:  Bone density: never  Mammogram: 2017   Allergies  Allergen Reactions   Morphine And Related Anaphylaxis     Current Outpatient Medications  Medication Sig Dispense Refill   glucose blood ([employer] GLUCOSE METER TEST STRIPS) test strip Use as instructed 100 each 12   Lancets (FREESTYLE) lancets Use as instructed 100 each 12   amLODipine (NORVASC) 10 MG tablet Take 1 tablet (10 mg total) by mouth daily. 90 tablet 0   blood glucose meter kit and supplies KIT Dispense based on patient and insurance preference. Use up to four times daily as directed. 1 each 0   ciprofloxacin (CIPRO) 250 MG tablet Take 1 tablet (250 mg total) by mouth 2 (two) times daily. 10 tablet 0   fenofibrate (TRICOR) 145 MG tablet TAKE 1 TABLET BY MOUTH EVERY DAY (Patient taking differently: Take 145 mg by mouth daily.) 90 tablet 2   fluticasone (FLONASE) 50 MCG/ACT nasal spray SPRAY 2 SPRAYS INTO EACH NOSTRIL EVERY DAY 48 mL 1   gabapentin (NEURONTIN) 300 MG capsule Take 1 capsule (300 mg total) by mouth 4 (four) times daily. Pt is only taking 2 capsules a day (Patient taking differently: Take 300 mg by mouth 2 (two) times daily.) 180 capsule 0   glucose blood test strip E11.69 Use as instructed 100 each 12   ibuprofen (ADVIL) 800 MG tablet Take 800 mg by mouth every 8 (eight) hours as needed for moderate pain. 30 tablet 0   Insulin Admin Supplies MISC Inject 24 Units into the skin daily.     insulin glargine (LANTUS SOLOSTAR) 100 UNIT/ML Solostar Pen INJECT 24 UNITS INTO THE SKIN DAILY 15 mL 1   Insulin Pen Needle 31G X 5 MM MISC UAD for Sq inj for DM qd 100 each PRN   JANUMET 50-1000 MG tablet TAKE 1 TABLET BY MOUTH EVERY DAY WITH BREAKFAST 90 tablet 1   lidocaine-prilocaine (EMLA) cream Apply to affected area once 30 g 3   losartan (COZAAR) 50 MG tablet Take 1 tablet (50 mg total) by mouth 2 (two) times daily. 180 tablet 3   metoprolol succinate (TOPROL-XL) 25 MG 24 hr tablet TAKE 1 TABLET BY MOUTH EVERY DAY 90 tablet 1   ondansetron (ZOFRAN) 8 MG tablet Take 1 tablet (8 mg total) by mouth 2 (two) times daily as needed (Nausea or  vomiting). 30 tablet 1   oxyCODONE-acetaminophen (PERCOCET) 10-325 MG tablet Take 1 tablet by mouth 2 (two) times daily as needed for pain. 30 tablet 0   pantoprazole (PROTONIX) 40 MG tablet TAKE 1 TABLET BY MOUTH EVERY DAY (Patient taking differently: Take 40 mg by mouth daily.) 90 tablet 0   pioglitazone (ACTOS) 15 MG tablet TAKE 1 TABLET BY MOUTH EVERY DAY 90 tablet 1  pravastatin (PRAVACHOL) 20 MG tablet TAKE 1 TABLET BY MOUTH EVERY DAY (Patient taking differently: Take 20 mg by mouth daily.) 90 tablet 2   rivaroxaban (XARELTO) 20 MG TABS tablet Take 1 tablet (20 mg total) by mouth daily with supper. 30 tablet 3   No current facility-administered medications for this visit.    OBJECTIVE: White woman examined in a wheelchair There were no vitals filed for this visit.  Wt Readings from Last 3 Encounters:  04/11/21 178 lb 8 oz (81 kg)  03/22/21 190 lb 9.6 oz (86.5 kg)  01/11/21 218 lb 14.7 oz (99.3 kg)   There is no height or weight on file to calculate BMI.   ECOG FS:2 - Symptomatic, <50% confined to bed  Telemedicine visit 04/27/2021  LAB RESULTS:  CMP   No results found for: TOTALPROTELP, ALBUMINELP, A1GS, A2GS, BETS, BETA2SER, GAMS, MSPIKE, SPEI  No results found for: KPAFRELGTCHN, LAMBDASER, KAPLAMBRATIO  Lab Results  Component Value Date   WBC 8.6 04/21/2021   NEUTROABS 7.8 (H) 04/21/2021   HGB 8.3 (L) 04/21/2021   HCT 27.4 (L) 04/21/2021   MCV 89.5 04/21/2021   PLT 465 (H) 04/21/2021    No results found for: LABCA2  No components found for: [id number]  No results for input(s): INR in the last 168 hours.  No results found for: LABCA2  No results found for: IOE703  No results found for: JKK938  No results found for: HWE993  Lab Results  Component Value Date   CA2729 194.4 (H) 04/11/2021    No components found for: HGQUANT  No results found for: CEA1 / No results found for: CEA1   No results found for: AFPTUMOR  No results found for:  [place]  No results found for: HGBA, HGBA2QUANT, HGBFQUANT, HGBSQUAN (Hemoglobinopathy evaluation)   No results found for: LDH  No results found for: IRON, TIBC, IRONPCTSAT (Iron and TIBC)  No results found for: FERRITIN  Urinalysis    Component Value Date/Time   COLORURINE YELLOW 04/21/2021 1304   APPEARANCEUR CLOUDY (A) 04/21/2021 1304   LABSPEC 1.012 04/21/2021 1304   PHURINE 5.0 04/21/2021 1304   GLUCOSEU NEGATIVE 04/21/2021 1304   HGBUR MODERATE (A) 04/21/2021 1304   BILIRUBINUR NEGATIVE 04/21/2021 1304   KETONESUR NEGATIVE 04/21/2021 1304   PROTEINUR 30 (A) 04/21/2021 1304   NITRITE NEGATIVE 04/21/2021 1304   LEUKOCYTESUR LARGE (A) 04/21/2021 1304    STUDIES: ECHOCARDIOGRAM COMPLETE  Result Date: 03/28/2021    ECHOCARDIOGRAM REPORT   Patient Name:   KASEY HANSELL Date of Exam: 03/28/2021 Medical Rec #:  [id number]          Height:       64.0 in Accession #:    [id number]         Weight:       190.6 lb Date of Birth:  [birth year]         BSA:          1.917 m Patient Age:    63 years           BP:           116/58 mmHg Patient Gender: F                  HR:           97 bpm. Exam Location:  [place] Procedure: 2D Echo, Cardiac Doppler and Color Doppler Indications:    C50.919 Metastatic breast cancer  History:  Patient has no prior history of Echocardiogram examinations.                 Metastatic breast cancer.  Sonographer:    Coralyn Helling RDCS Referring Phys: Person Comments: Technically challenging study due to limited acoustic windows and Technically difficult study due to poor echo windows. IMPRESSIONS  1. Left ventricular ejection fraction, by estimation, is 60 to 65%. The left ventricle has normal function. Left ventricular endocardial border not optimally defined to evaluate regional wall motion, but no wall motion abnormalities seen in views obtained. Left ventricular diastolic parameters are consistent with Grade I  diastolic dysfunction (impaired relaxation).  2. Right ventricular systolic function was not well visualized. The right ventricular size is not well visualized.  3. The mitral valve is grossly normal. No evidence of mitral valve regurgitation. No evidence of mitral stenosis.  4. The aortic valve was not well visualized, though likely trileaflet. Aortic valve regurgitation is not visualized. No aortic stenosis is present. Comparison(s): No prior Echocardiogram. FINDINGS  Left Ventricle: Left ventricular ejection fraction, by estimation, is 60 to 65%. The left ventricle has normal function. Left ventricular endocardial border not optimally defined to evaluate regional wall motion. The left ventricular internal cavity size was normal in size. There is no left ventricular hypertrophy. Left ventricular diastolic parameters are consistent with Grade I diastolic dysfunction (impaired relaxation). Right Ventricle: The right ventricular size is not well visualized. Right vetricular wall thickness was not assessed. Right ventricular systolic function was not well visualized. Left Atrium: Left atrial size was normal in size. Right Atrium: Right atrial size was not assessed. Pericardium: There is no evidence of pericardial effusion. Mitral Valve: The mitral valve is grossly normal. No evidence of mitral valve regurgitation. No evidence of mitral valve stenosis. Tricuspid Valve: The tricuspid valve is not well visualized. Tricuspid valve regurgitation is not demonstrated. No evidence of tricuspid stenosis. Aortic Valve: The aortic valve was not well visualized. Aortic valve regurgitation is not visualized. No aortic stenosis is present. Pulmonic Valve: The pulmonic valve was not well visualized. Pulmonic valve regurgitation is not visualized. No evidence of pulmonic stenosis. Aorta: The aortic root and ascending aorta are structurally normal, with no evidence of dilitation. Venous: The inferior vena cava was not well visualized.  IAS/Shunts: The interatrial septum was not assessed.  LEFT VENTRICLE PLAX 2D LVIDd:         4.40 cm  Diastology LVIDs:         3.10 cm  LV e' medial:    7.94 cm/s LV PW:         0.80 cm  LV E/e' medial:  11.2 LV IVS:        1.00 cm  LV e' lateral:   6.42 cm/s LVOT diam:     2.00 cm  LV E/e' lateral: 13.9 LV SV:         47 LV SV Index:   25 LVOT Area:     3.14 cm  LEFT ATRIUM           Index       RIGHT ATRIUM LA diam:      3.00 cm 1.57 cm/m  RA Pressure: 3.00 mmHg LA Vol (A4C): 38.6 ml 20.14 ml/m  AORTIC VALVE LVOT Vmax:   85.00 cm/s LVOT Vmean:  60.200 cm/s LVOT VTI:    0.151 m  AORTA Ao Root diam: 3.30 cm Ao Asc diam:  3.20 cm MITRAL VALVE  TRICUSPID VALVE MV Area (PHT): 3.34 cm    Estimated RAP:  3.00 mmHg MV Decel Time: 227 msec MV E velocity: 89.20 cm/s  SHUNTS MV A velocity: 89.20 cm/s  Systemic VTI:  0.15 m MV E/A ratio:  1.00        Systemic Diam: 2.00 cm Rudean Haskell MD Electronically signed by Rudean Haskell MD Signature Date/Time: 03/28/2021/3:32:49 PM    Final    IR IMAGING GUIDED PORT INSERTION  Result Date: 03/30/2021 INDICATION: 65 year old with metastatic breast cancer. Patient has had previous left chest Port-A-Caths. Right mastectomy and right upper extremity lymphedema. Recent surgery on right humerus. Patient needs central venous access for systemic treatment. EXAM: FLUOROSCOPIC AND ULTRASOUND GUIDED PLACEMENT OF A SUBCUTANEOUS PORT COMPARISON:  None. MEDICATIONS: Moderate sedation ANESTHESIA/SEDATION: Versed 4.0 mg IV; Fentanyl 100 mcg IV; Moderate Sedation Time:  34 minutes The patient was continuously monitored during the procedure by the interventional radiology nurse under my direct supervision. FLUOROSCOPY TIME:  24 seconds, 6 mGy COMPLICATIONS: None immediate. PROCEDURE: Patient's prior imaging was reviewed and the neck and chest was examined with ultrasound. Ultrasound and previous imaging demonstrates occlusion of the left internal jugular vein and left  subclavian vein. Patient has extensive collateral vessels in left neck and upper chest region. Previous chest CT demonstrates that the left innominate vein is probably patent but difficult to access. Right jugular vein was widely patent on imaging. Patient has a small amount of subcutaneous tissue in the right upper chest due to previous surgeries and treatments. We discussed placing a port versus tunneled central venous catheter. Patient wanted to avoid the tunneled central venous catheter and attempt a port placement. The procedure, risks, benefits, and alternatives were explained to the patient. Questions regarding the procedure were encouraged and answered. The patient understands and consents to the procedure. Patient was placed supine on the interventional table. Ultrasound confirmed a patent right internal jugular vein. Ultrasound image was saved for documentation. The right chest and neck were cleaned with a skin antiseptic and a sterile drape was placed. Maximal barrier sterile technique was utilized including caps, mask, sterile gowns, sterile gloves, sterile drape, hand hygiene and skin antiseptic. The right neck was anesthetized with 1% lidocaine. Small incision was made in the right neck with a blade. Micropuncture set was placed in the right internal jugular vein with ultrasound guidance. The micropuncture wire was used for measurement purposes. The right chest was anesthetized with 1% lidocaine with epinephrine. #15 blade was used to make an incision and a subcutaneous port pocket was formed. [place] was assembled. Subcutaneous tunnel was formed with a stiff tunneling device. The port catheter was brought through the subcutaneous tunnel. The port was placed in the subcutaneous pocket. The micropuncture set was exchanged for a peel-away sheath. The catheter was placed through the peel-away sheath and the tip was positioned at the superior cavoatrial junction. Catheter placement was  confirmed with fluoroscopy. The port was accessed and flushed with heparinized saline. The port pocket was closed using two layers of absorbable sutures and Dermabond. The vein skin site was closed using a single layer of absorbable suture and Dermabond. Sterile dressings were applied. Patient tolerated the procedure well without an immediate complication. Ultrasound and fluoroscopic images were taken and saved for this procedure. IMPRESSION: Placement of a subcutaneous port device. Catheter tip at the superior cavoatrial junction. Electronically Signed   By: Markus Daft M.D.   On: 03/30/2021 16:20     Refuses mammography at this point (  12/23/2019)   ELIGIBLE FOR AVAILABLE RESEARCH PROTOCOL: no   ASSESSMENT: 65 y.o. [place], [place] woman with stage IV breast cancer, as follows:  (1) status post right mastectomy in 2000, for a pT2 pN2, stage IIIA invasive ductal carcinoma, estrogen receptor positive, progesterone receptor not tested, HER-2 negative (0) by immunohistochemistry  (a) adjuvant chemotherapy with doxorubicin and cyclophosphamide in dose dense fashion x4 followed by paclitaxel in dose dense fashion x4  (b) adjuvant radiation: 30 doses  (c) antiestrogens: Tamoxifen for 5 years, completed 2005  (2) METASTASTIC DISEASE: 2008, involving bones, lungs, and lymph nodes  (a) CA 27-29 is informative  (b) CT of the chest abdomen and pelvis in [facility] finds stable sclerotic metastases (T8, T10, T11, sternum, L4, L5); 0.6 cm left upper lobe lung nodule stable  (3)  prior anti-estrogen treatments:  (a) fulvestrant--progression  (b) exemestane/everolimus--hyperlipidemia, hyperglycemia  (4) prior chemotherapy treatments:  (a) capecitabine: progression  (b) Abraxane, August 2014 through 11/18/2015: good response but stopped due to neuropathy  (c) gemcitabine 01/20/2016--?; multiple interruptions secondary to infections  (5) radiation therapy:  (a) T spine and Right femur,  completed 01/10/2016  (6) letrozole/ palbociclib started 07/13/2016, discontinued 10/26/2020 with evidence of progression  (7) bone treatment:  (a) denosumab/Xgeva--discontinued January 2016 due to osteonecrosis of the jaw  (8) cancer associated pain: Updated 12/01/2020 (a) OxyIR 5 mg QID PRN  (b) PMP Aware reviewed 12/01/2020  (c) OxyContin 20 mg p.o. 3 times daily  (d) bowel prophylaxis: MiraLAX as needed  (9) restaging studies:1  (a) mostly stable bone lesions with evidence of progression at L3-L5; no epidural tumor by total spinal MRI in November 2019  (b) no evidence of progressive lung, lymph node or liver lesions by CT scans of the chest abdomen and pelvis and bone scan December 2019  (c) CT scans abd/pelvis 03/31/2019 shows stable bone metastases, no extraosseous disease  (d) MRI pelvis 04/09/2019 shows bilateral ilium, left acetabulum and lumbar mets, stable  (e) CT abd/pelvis 09/01/2019 shows stable bone mets, slight increase left effusion  (f) CT of the chest 05/05/2020 again shows minimal increase in the left pleural effusion, possible resorption of bone around the left glenoid lesion, otherwise stable  (g) CT of the chest abdomen and pelvis 10/18/2020 again shows no evidence of visceral disease, but there is apparent progression in the bone disease, and some enlargement of the left pleural effusion.  The CA 27-29 has also been rising.  Accordingly her letrozole and palbociclib are being discontinued  (10) palliative radiation 11/18/2018 through 12/02/2018 Site/dose:   1. Lumbar spine; 35 Gy in 14 fractions of 2.5 Gy                      2. Pelvis; 35 Gy in 14 fractions of 2.5 Gy  (11) started cyclophosphamide, methotrexate and fluorouracil [CMF] 11/10/2020  (a) methotrexate held first dose, during palliative radiation  (B) discontinued after 3 cycles (last dose 12/22/2020)  (C) CT chest 01/23/2021 showed worsening left effusion, cytologically positive  (12) status post left  thoracentesis 11/21/2020, with positive cytology  (a) tumor cells are strongly estrogen and progesterone receptor positive, HER-2 negative  (b) foundation 1 requested on the thoracentesis sample shows a PI K3 CA mutation.  Microsatellite status is stable and the tumor mutation burden is low.  There are mutations in ESR 1, GAT A3, and MSH 6  (C) left pleural drain placed 02/10/2021  (13) status post Right humeral shaft intramedullary nail fixation Right proximal  humeral nonunion treatment 01/12/2021 [facility])  (14) alpelisib started January 27, 2021, fulvestrant started 01/26/2021  (a) alpelisib and fulvestrant discontinued June 2022 with HbAic of 9.7  (15) to start Doxil 04/11/2021, to be repeated every 21 days  (a) echo 03/28/2021 shows an ejection fraction in the 60-65% range   PLAN: Mia [name] did very well with her first Doxil dose.  She returns to see me 05/02/2021 for her second dose and the plan will be to do at least 4 doses and then restage.  She requested a refill on her Percocet which I was glad to do.  She knows to call for any other issue that may develop before then. Virgie Dad. Waunita Sandstrom, MD 04/27/21 11:07 AM Medical Oncology and Hematology [facility] [place], [place] [address] Tel. [phone]    Fax. [phone]   I, Wilburn Mylar, am acting as scribe for Dr. Virgie Dad. Renada Cronin.  I, Lurline Del MD, have reviewed the above documentation for accuracy and completeness, and I agree with the above.   *Total Encounter Time as defined by the Centers for Medicare and Medicaid Services includes, in addition to the face-to-face time of a patient visit (documented in the note above) non-face-to-face time: obtaining and reviewing outside history, ordering and reviewing medications, tests or procedures, care coordination (communications with other health care professionals or caregivers) and documentation in the medical record.

## 2021-04-29 DIAGNOSIS — I959 Hypotension, unspecified: Secondary | ICD-10-CM | POA: Diagnosis not present

## 2021-04-29 DIAGNOSIS — R55 Syncope and collapse: Secondary | ICD-10-CM | POA: Diagnosis not present

## 2021-04-29 DIAGNOSIS — R42 Dizziness and giddiness: Secondary | ICD-10-CM | POA: Diagnosis not present

## 2021-05-01 ENCOUNTER — Telehealth: Payer: Self-pay | Admitting: Family Medicine

## 2021-05-01 NOTE — Progress Notes (Signed)
Mia [name]  [phone] [phone]    ID: Mia [name] DOB: [birth year]  MR#: [id number]  KDX#:[id number]  Patient Care Team: Mia Nian, DO as PCP - General (Family Medicine) Mia [name], Mia Dad, MD as Consulting Physician (Oncology) OTHER MD: Mia Austria MD, Mia Sabal PA  CHIEF COMPLAINT: Estrogen receptor positive stage IV breast cancer (s/p right mastectomy)  CURRENT TREATMENT: Doxil, rivaroxaban, Venofer   INTERVAL HISTORY: Mia [name] returns today for follow-up of her estrogen receptor positive stage IV breast cancer.  She is accompanied by her husband  She was switched to Doxil on 04/11/2021.  She continues to tolerate this remarkably well.  She has had no increased fatigue, no increased peripheral neuropathy, no mouth sores, no palmar plantar erythrodysesthesia.  Echo 03/28/2021 showed an ejection fraction in the 60-65% range.  She will in addition received a dose of Venofer today.   REVIEW OF SYSTEMS: Mia [name] tells me she passed out this past weekend, on 04/29/2021.  She was in the bathroom, bent down to tie her shoes and passed out.  EMS was called then.  They found that her blood pressure was very low, in the low 80s.  She has had no further problems with that.  Her pain is very well controlled on 1 Percocet in the morning.  She has normal bowel movements every day despite the narcotic.  A detailed review of systems today was otherwise stable   COVID 19 VACCINATION STATUS: [place] x2, with booster 08/16/2020   HISTORY OF CURRENT ILLNESS: From the original intake note:  We have reviewed the medical records from [place], which is the source of the information below:  Mia [name] was initially diagnosed with Stage IIIA (T2N2M0) invasive ductal carcinoma, estrogen receptor positive and HER2 negative right breast cancer in 2000. She underwent right mastectomy, with 4 positive metastatic lymph nodes. She had  chemotherapy with taxol and cytoxan and fulvestrant in the past. She had radiation and completed 5 years of tamoxifen.   She was diagnosed with Stage IV disease 04/18/2005 with metastases to the bone, lung, and additional nodes. She was on capecitabine but was discontinued after rising CA 27-29 and scans.   She started anastrozole and everolimus with Delton See in April 2014. She tolerated this well, but this was discontinued in July 2014 due to abnormal blood tests, hyperlipidemia and hyperglycemia.  She began Abraxane 144m /m2 and Xgeva in August 2014 weekly x3 with 1 week off. She tolerated this treatment well. She discontinued Xgeva January 2016 due to osteonecrosis of the jaw and mouth issues. She continued abraxane alone until her last dose on 11/18/2015 due to neuropathy and disease progression with restaging studies on 11/23/2015 with a CT chest abdomen and pelvis and one scan showing skull, sternal and femoral lesions.   She was switched to gemcitabine starting 01/20/2016 weekly x3 and 1 week off. This was discontinued on 06/15/2016 due to a port related jugular clot on the right side. She was given Lovenox for 3 months.  She was switched to Letrozole 2.5 mg and palbociclib 125 mg starting 07/13/2016. She tolerated this well. Restaging studies with CT chest abdomen and pelvis and bone scan on 07/01/2017 showed stable/ improving lesions. CA 27-29 was stable.  During the last few months of follow up in [place] she completed restaging studies with [facility] showing: A 6 mm pulmonary nodule in the left upper lobe laterally. Progression of metastatic disease to the bones  at the L4 and L5 vertebral bodies. Sclerotic metastases at T8, T10, an T11, are similar to previous exams. Sclerotic metastases in the sternum stable. Hepatic steatosis. Aortic atherosclerosis.  Most recent CA-8-29 (January 2019) was 58.  Most recent hemoglobin A1c was 7.1 according to  the patient  The patient's subsequent history is as detailed below.   PAST MEDICAL HISTORY: Past Medical History:  Diagnosis Date   Breast cancer metastasized to bone [facility])    Diabetes mellitus without complication (Mia [name])    Dyspnea    History of radiation therapy 11/18/2018-12/02/2018   lumbar spine and pelvis   Dr Mia [name]   History of radiation therapy 11/08/2020-11/18/2020   right arm   Dr Mia [name]   Hyperlipidemia    Hypertension    Lymphedema of right arm    Neuropathy associated with cancer ([place])    Obesity (BMI 35.0-39.9 without comorbidity)    PONV (postoperative nausea and vomiting)    Pulmonary embolism ([place]) 12/2020     PAST SURGICAL HISTORY: Past Surgical History:  Procedure Laterality Date   CATARACT EXTRACTION Left    HUMERUS IM NAIL Right 01/11/2021   Procedure: INTRAMEDULLARY (IM) NAIL HUMERAL;  Surgeon: Mia Gash, MD;  Location: [facility];  Service: Orthopedics;  Laterality: Right;   HYSTERECTOMY ABDOMINAL WITH SALPINGO-OOPHORECTOMY     IR IMAGING GUIDED PORT INSERTION  03/30/2021   IR PERC PLEURAL DRAIN W/INDWELL CATH W/IMG GUIDE  02/10/2021   MODIFIED RADICAL MASTECTOMY Right      FAMILY HISTORY: Family History  Problem Relation Age of Onset   Diabetes Mother    Breast cancer Mother 30   Cerebral aneurysm Mother    Pulmonary embolism Father    Colon cancer Father 53   Breast cancer Sister 64       noninvasive   Breast cancer Paternal Grandmother 79   The patient reports she had negative genetic testing at [facility] 2014, report not available. --The patient's father died at age 85 due to a PE, and he also had a history of colon cancer diagnosed at age 45. The patient's mother died at age 48 due to diabetes and survived a cerebral aneurysm. The patient's mother also had a history of breast cancer diagnosed at age 27.  The patient has no brothers and 1 sister. The patient's sister was diagnosed with non invasive breast cancer at age 41.  There was a paternal grandmother diagnosed with breast cancer at age 71. The patient denies a family history of ovarian cancer.    GYNECOLOGIC HISTORY:  Patient's last menstrual period was 10/01/2006. Menarche: 65 years old Age at first live birth: 65 years old She is [place] P2.  She is status post hysterectomy with bilateral salpingo- oophorectomy 12/23/2006 with benign pathology ([id number]) She never used HRT.    SOCIAL HISTORY:  Mia [name] is disabled due to her breast cancer. She used to be Surveyor, quantity for a cardiology office. Her husband, [name] is in the process of retiring. He is a Electrical engineer for a power company. The patient's daughter, [name], lives in [place] and works as a Teaching laboratory technician. The patient's second daughter, [name] is a stay at home mom and is a special needs teacher, who will soon move to [facility] [place] The patient plans on attending [employer].     ADVANCED DIRECTIVES: In the absence of any documentation to the contrary, the patient's spouse is their HCPOA.    HEALTH MAINTENANCE: Social History  Tobacco Use   Smoking status: Never   Smokeless tobacco: Never  Vaping Use   Vaping Use: Never used  Substance Use Topics   Alcohol use: Not Currently   Drug use: Never    Colonoscopy: 2017   PAP:  Bone density: never  Mammogram: 2017   Allergies  Allergen Reactions   Morphine And Related Anaphylaxis    Current Outpatient Medications  Medication Sig Dispense Refill   glucose blood ([employer] GLUCOSE METER TEST STRIPS) test strip Use as instructed 100 each 12   Lancets (FREESTYLE) lancets Use as instructed 100 each 12   amLODipine (NORVASC) 10 MG tablet Take 1 tablet (10 mg total) by mouth daily. 90 tablet 0   blood glucose meter kit and supplies KIT Dispense based on patient and insurance preference. Use up to four times daily as directed. 1 each 0   ciprofloxacin (CIPRO) 250 MG tablet Take 1 tablet (250 mg total)  by mouth 2 (two) times daily. 10 tablet 0   fenofibrate (TRICOR) 145 MG tablet TAKE 1 TABLET BY MOUTH EVERY DAY (Patient taking differently: Take 145 mg by mouth daily.) 90 tablet 2   fluticasone (FLONASE) 50 MCG/ACT nasal spray SPRAY 2 SPRAYS INTO EACH NOSTRIL EVERY DAY 48 mL 1   gabapentin (NEURONTIN) 300 MG capsule Take 1 capsule (300 mg total) by mouth 4 (four) times daily. Pt is only taking 2 capsules a day (Patient taking differently: Take 300 mg by mouth 2 (two) times daily.) 180 capsule 0   glucose blood test strip E11.69 Use as instructed 100 each 12   ibuprofen (ADVIL) 800 MG tablet Take 800 mg by mouth every 8 (eight) hours as needed for moderate pain. 30 tablet 0   Insulin Admin Supplies MISC Inject 24 Units into the skin daily.     insulin glargine (LANTUS SOLOSTAR) 100 UNIT/ML Solostar Pen INJECT 24 UNITS INTO THE SKIN DAILY 15 mL 1   Insulin Pen Needle 31G X 5 MM MISC UAD for Sq inj for DM qd 100 each PRN   JANUMET 50-1000 MG tablet TAKE 1 TABLET BY MOUTH EVERY DAY WITH BREAKFAST 90 tablet 1   lidocaine-prilocaine (EMLA) cream Apply to affected area once 30 g 3   losartan (COZAAR) 50 MG tablet Take 1 tablet (50 mg total) by mouth 2 (two) times daily. 180 tablet 3   metoprolol succinate (TOPROL-XL) 25 MG 24 hr tablet TAKE 1 TABLET BY MOUTH EVERY DAY 90 tablet 1   ondansetron (ZOFRAN) 8 MG tablet Take 1 tablet (8 mg total) by mouth 2 (two) times daily as needed (Nausea or vomiting). 30 tablet 1   oxyCODONE-acetaminophen (PERCOCET) 10-325 MG tablet Take 1 tablet by mouth 2 (two) times daily as needed for pain. 30 tablet 0   pantoprazole (PROTONIX) 40 MG tablet TAKE 1 TABLET BY MOUTH EVERY DAY (Patient taking differently: Take 40 mg by mouth daily.) 90 tablet 0   pioglitazone (ACTOS) 15 MG tablet TAKE 1 TABLET BY MOUTH EVERY DAY 90 tablet 1   pravastatin (PRAVACHOL) 20 MG tablet TAKE 1 TABLET BY MOUTH EVERY DAY (Patient taking differently: Take 20 mg by mouth daily.) 90 tablet 2    rivaroxaban (XARELTO) 20 MG TABS tablet Take 1 tablet (20 mg total) by mouth daily with supper. 30 tablet 3   No current facility-administered medications for this visit.    OBJECTIVE: White woman examined in a wheelchair Vitals:   05/02/21 1023  BP: 101/68  [name]: (!) 122  Resp: 18  Temp: 97.9 F (36.6 C)  SpO2: 99%    Wt Readings from Last 3 Encounters:  04/11/21 178 lb 8 oz (81 kg)  03/22/21 190 lb 9.6 oz (86.5 kg)  01/11/21 218 lb 14.7 oz (99.3 kg)   Body mass index is 30.64 kg/m.   ECOG FS:2 - Symptomatic, <50% confined to bed  Sclerae unicteric, EOMs intact Wearing a mask No cervical or supraclavicular adenopathy Lungs no rales or rhonchi Heart regular rate and rhythm Abd soft, nontender, MSK no focal spinal tenderness, chronic right upper extremity lymphedema with minimal erythema Neuro: nonfocal, well oriented, appropriate affect Breasts: Deferred   LAB RESULTS:  CMP   No results found for: TOTALPROTELP, ALBUMINELP, A1GS, A2GS, BETS, BETA2SER, GAMS, MSPIKE, SPEI  No results found for: KPAFRELGTCHN, LAMBDASER, KAPLAMBRATIO  Lab Results  Component Value Date   WBC 3.7 (L) 05/02/2021   NEUTROABS 2.4 05/02/2021   HGB 8.1 (L) 05/02/2021   HCT 26.6 (L) 05/02/2021   MCV 86.6 05/02/2021   PLT 570 (H) 05/02/2021    No results found for: LABCA2  No components found for: JQZESP233  No results for input(s): INR in the last 168 hours.  No results found for: LABCA2  No results found for: AQT622  No results found for: QJF354  No results found for: TGY563  Lab Results  Component Value Date   CA2729 194.4 (H) 04/11/2021    No components found for: HGQUANT  No results found for: CEA1 / No results found for: CEA1   No results found for: AFPTUMOR  No results found for: CHROMOGRNA  No results found for: HGBA, HGBA2QUANT, HGBFQUANT, HGBSQUAN (Hemoglobinopathy evaluation)   No results found for: LDH  No results found for: IRON, TIBC,  IRONPCTSAT (Iron and TIBC)  No results found for: FERRITIN  Urinalysis    Component Value Date/Time   COLORURINE YELLOW 04/21/2021 1304   APPEARANCEUR CLOUDY (A) 04/21/2021 1304   LABSPEC 1.012 04/21/2021 1304   PHURINE 5.0 04/21/2021 1304   GLUCOSEU NEGATIVE 04/21/2021 1304   HGBUR MODERATE (A) 04/21/2021 1304   BILIRUBINUR NEGATIVE 04/21/2021 1304   KETONESUR NEGATIVE 04/21/2021 1304   PROTEINUR 30 (A) 04/21/2021 1304   NITRITE NEGATIVE 04/21/2021 1304   LEUKOCYTESUR LARGE (A) 04/21/2021 1304    STUDIES: No results found.   Refuses mammography at this point (12/23/2019)   ELIGIBLE FOR AVAILABLE RESEARCH PROTOCOL: no   ASSESSMENT: 65 y.o. [place], [place] woman with stage IV breast cancer, as follows:  (1) status post right mastectomy in 2000, for a pT2 pN2, stage IIIA invasive ductal carcinoma, estrogen receptor positive, progesterone receptor not tested, HER-2 negative (0) by immunohistochemistry  (a) adjuvant chemotherapy with doxorubicin and cyclophosphamide in dose dense fashion x4 followed by paclitaxel in dose dense fashion x4  (b) adjuvant radiation: 30 doses  (c) antiestrogens: Tamoxifen for 5 years, completed 2005  (2) METASTASTIC DISEASE: 2008, involving bones, lungs, and lymph nodes  (a) CA 27-29 is informative  (b) CT of the chest abdomen and pelvis in [facility] finds stable sclerotic metastases (T8, T10, T11, sternum, L4, L5); 0.6 cm left upper lobe lung nodule stable  (3)  prior anti-estrogen treatments:  (a) fulvestrant--progression  (b) exemestane/everolimus--hyperlipidemia, hyperglycemia  (4) prior chemotherapy treatments:  (a) capecitabine: progression  (b) Abraxane, August 2014 through 11/18/2015: good response but stopped due to neuropathy  (c) gemcitabine 01/20/2016--?; multiple interruptions secondary to infections  (5) radiation therapy:  (a) T spine and Right femur, completed 01/10/2016  (6) letrozole/ palbociclib started  07/13/2016, discontinued 10/26/2020 with evidence of progression  (7) bone treatment:  (a) denosumab/Xgeva--discontinued January 2016 due to osteonecrosis of the jaw  (8) cancer associated pain: Updated 12/01/2020 (a) OxyIR 5 mg QID PRN  (b) PMP Aware reviewed 12/01/2020  (c) OxyContin 20 mg p.o. 3 times daily  (d) bowel prophylaxis: MiraLAX as needed  (9) restaging studies:1  (a) mostly stable bone lesions with evidence of progression at L3-L5; no epidural tumor by total spinal MRI in November 2019  (b) no evidence of progressive lung, lymph node or liver lesions by CT scans of the chest abdomen and pelvis and bone scan December 2019  (c) CT scans abd/pelvis 03/31/2019 shows stable bone metastases, no extraosseous disease  (d) MRI pelvis 04/09/2019 shows bilateral ilium, left acetabulum and lumbar mets, stable  (e) CT abd/pelvis 09/01/2019 shows stable bone mets, slight increase left effusion  (f) CT of the chest 05/05/2020 again shows minimal increase in the left pleural effusion, possible resorption of bone around the left glenoid lesion, otherwise stable  (g) CT of the chest abdomen and pelvis 10/18/2020 again shows no evidence of visceral disease, but there is apparent progression in the bone disease, and some enlargement of the left pleural effusion.  The CA 27-29 has also been rising.  Accordingly her letrozole and palbociclib are being discontinued  (10) palliative radiation 11/18/2018 through 12/02/2018 Site/dose:   1. Lumbar spine; 35 Gy in 14 fractions of 2.5 Gy                      2. Pelvis; 35 Gy in 14 fractions of 2.5 Gy  (11) started cyclophosphamide, methotrexate and fluorouracil [CMF] 11/10/2020  (a) methotrexate held first dose, during palliative radiation  (B) discontinued after 3 cycles (last dose 12/22/2020)  (C) CT chest 01/23/2021 showed worsening left effusion, cytologically positive  (12) status post left thoracentesis 11/21/2020, with positive cytology  (a)  tumor cells are strongly estrogen and progesterone receptor positive, HER-2 negative  (b) foundation 1 requested on the thoracentesis sample shows a PI K3 CA mutation.  Microsatellite status is stable and the tumor mutation burden is low.  There are mutations in ESR 1, GAT A3, and [place] 6  (C) left pleural drain placed 02/10/2021  (13) status post Right humeral shaft intramedullary nail fixation Right proximal humeral nonunion treatment 01/12/2021 [facility])  (14) alpelisib started January 27, 2021, fulvestrant started 01/26/2021  (a) alpelisib and fulvestrant discontinued June 2022 with HbAic of 9.7  (15) started Doxil 04/11/2021, to be repeated every 21 days  (a) echo 03/28/2021 shows an ejection fraction in the 60-65% range   PLAN: Mia [name] will proceed to her Doxil treatment today.  So far she is doing very well without medication and she is very pleased at how well she is tolerating it.  She is significantly anemic and has had low blood pressure.  We are stopping her Norvasc and losartan.  We are continuing the metoprolol.  We will be following her blood pressure in subsequent visits.  We gave her some Cipro for her urinary tract infection and those symptoms have resolved.  She is very anemic.  This is likely at least partly responsible for her passing out this past weekend.  I will going to give her Venofer today and hope that this improves her hemoglobin.  If not we will add a transfusion at the next treatment  She will see me again in 3 weeks.  She knows to call for any other issue  that may develop before then  Total encounter time 25 minutes.Sarajane Jews C. Kamron Vanwyhe, MD 05/02/21 10:39 AM Medical Oncology and Hematology [facility] [place], [place] [address] Tel. [phone]    Fax. [phone]   I, Wilburn Mylar, am acting as scribe for Dr. Virgie [name]. Juvia Aerts.  I, Lurline Del MD, have reviewed the above documentation for accuracy and  completeness, and I agree with the above.   *Total Encounter Time as defined by the Centers for Medicare and Medicaid Services includes, in addition to the face-to-face time of a patient visit (documented in the note above) non-face-to-face time: obtaining and reviewing outside history, ordering and reviewing medications, tests or procedures, care coordination (communications with other health care professionals or caregivers) and documentation in the medical record.

## 2021-05-01 NOTE — Telephone Encounter (Signed)
Pt called in

## 2021-05-02 ENCOUNTER — Other Ambulatory Visit: Payer: Self-pay

## 2021-05-02 ENCOUNTER — Inpatient Hospital Stay: Payer: MEDICARE

## 2021-05-02 ENCOUNTER — Inpatient Hospital Stay (HOSPITAL_BASED_OUTPATIENT_CLINIC_OR_DEPARTMENT_OTHER): Payer: MEDICARE | Admitting: Oncology

## 2021-05-02 ENCOUNTER — Inpatient Hospital Stay: Payer: MEDICARE | Attending: Oncology

## 2021-05-02 VITALS — BP 101/68 | HR 122 | Temp 97.9°F | Resp 18 | Ht 64.0 in

## 2021-05-02 VITALS — BP 99/59 | HR 106 | Temp 97.9°F | Resp 18

## 2021-05-02 DIAGNOSIS — Z79811 Long term (current) use of aromatase inhibitors: Secondary | ICD-10-CM | POA: Diagnosis not present

## 2021-05-02 DIAGNOSIS — Z170 Estrogen receptor positive status [ER+]: Secondary | ICD-10-CM | POA: Insufficient documentation

## 2021-05-02 DIAGNOSIS — M879 Osteonecrosis, unspecified: Secondary | ICD-10-CM | POA: Diagnosis not present

## 2021-05-02 DIAGNOSIS — C50919 Malignant neoplasm of unspecified site of unspecified female breast: Secondary | ICD-10-CM

## 2021-05-02 DIAGNOSIS — D649 Anemia, unspecified: Secondary | ICD-10-CM | POA: Insufficient documentation

## 2021-05-02 DIAGNOSIS — I700 Atherosclerosis of aorta: Secondary | ICD-10-CM | POA: Diagnosis not present

## 2021-05-02 DIAGNOSIS — C7800 Secondary malignant neoplasm of unspecified lung: Secondary | ICD-10-CM | POA: Insufficient documentation

## 2021-05-02 DIAGNOSIS — C7951 Secondary malignant neoplasm of bone: Secondary | ICD-10-CM | POA: Diagnosis not present

## 2021-05-02 DIAGNOSIS — Z7189 Other specified counseling: Secondary | ICD-10-CM

## 2021-05-02 DIAGNOSIS — J90 Pleural effusion, not elsewhere classified: Secondary | ICD-10-CM | POA: Diagnosis not present

## 2021-05-02 DIAGNOSIS — C50911 Malignant neoplasm of unspecified site of right female breast: Secondary | ICD-10-CM | POA: Insufficient documentation

## 2021-05-02 DIAGNOSIS — K760 Fatty (change of) liver, not elsewhere classified: Secondary | ICD-10-CM | POA: Insufficient documentation

## 2021-05-02 DIAGNOSIS — C779 Secondary and unspecified malignant neoplasm of lymph node, unspecified: Secondary | ICD-10-CM | POA: Insufficient documentation

## 2021-05-02 DIAGNOSIS — G620 Drug-induced polyneuropathy: Secondary | ICD-10-CM

## 2021-05-02 DIAGNOSIS — G893 Neoplasm related pain (acute) (chronic): Secondary | ICD-10-CM | POA: Insufficient documentation

## 2021-05-02 DIAGNOSIS — T451X5A Adverse effect of antineoplastic and immunosuppressive drugs, initial encounter: Secondary | ICD-10-CM | POA: Diagnosis not present

## 2021-05-02 DIAGNOSIS — Z5111 Encounter for antineoplastic chemotherapy: Secondary | ICD-10-CM | POA: Insufficient documentation

## 2021-05-02 DIAGNOSIS — D508 Other iron deficiency anemias: Secondary | ICD-10-CM

## 2021-05-02 DIAGNOSIS — Z95828 Presence of other vascular implants and grafts: Secondary | ICD-10-CM

## 2021-05-02 DIAGNOSIS — Z9011 Acquired absence of right breast and nipple: Secondary | ICD-10-CM | POA: Insufficient documentation

## 2021-05-02 DIAGNOSIS — E785 Hyperlipidemia, unspecified: Secondary | ICD-10-CM | POA: Diagnosis not present

## 2021-05-02 DIAGNOSIS — Z923 Personal history of irradiation: Secondary | ICD-10-CM | POA: Diagnosis not present

## 2021-05-02 LAB — CMP (CANCER CENTER ONLY)
ALT: 14 U/L (ref 0–44)
AST: 19 U/L (ref 15–41)
Albumin: 2.7 g/dL — ABNORMAL LOW (ref 3.5–5.0)
Alkaline Phosphatase: 152 U/L — ABNORMAL HIGH (ref 38–126)
Anion gap: 10 (ref 5–15)
BUN: 14 mg/dL (ref 8–23)
CO2: 29 mmol/L (ref 22–32)
Calcium: 9.4 mg/dL (ref 8.9–10.3)
Chloride: 95 mmol/L — ABNORMAL LOW (ref 98–111)
Creatinine: 0.66 mg/dL (ref 0.44–1.00)
GFR, Estimated: >60 mL/min (ref >60)
Glucose, Bld: 121 mg/dL — ABNORMAL HIGH (ref 70–99)
Potassium: 4.5 mmol/L (ref 3.5–5.1)
Sodium: 134 mmol/L — ABNORMAL LOW (ref 135–145)
Total Bilirubin: 0.4 mg/dL (ref 0.3–1.2)
Total Protein: 7 g/dL (ref 6.5–8.1)

## 2021-05-02 LAB — TYPE AND SCREEN
ABO/RH(D): A NEG
Antibody Screen: NEGATIVE

## 2021-05-02 LAB — CBC WITH DIFFERENTIAL/PLATELET
Abs Immature Granulocytes: 0.16 10*3/uL — ABNORMAL HIGH (ref 0.00–0.07)
Basophils Absolute: 0 10*3/uL (ref 0.0–0.1)
Basophils Relative: 1 %
Eosinophils Absolute: 0.1 10*3/uL (ref 0.0–0.5)
Eosinophils Relative: 1 %
HCT: 26.6 % — ABNORMAL LOW (ref 36.0–46.0)
Hemoglobin: 8.1 g/dL — ABNORMAL LOW (ref 12.0–15.0)
Immature Granulocytes: 4 %
Lymphocytes Relative: 9 %
Lymphs Abs: 0.3 10*3/uL — ABNORMAL LOW (ref 0.7–4.0)
MCH: 26.4 pg (ref 26.0–34.0)
MCHC: 30.5 g/dL (ref 30.0–36.0)
MCV: 86.6 fL (ref 80.0–100.0)
Monocytes Absolute: 0.7 10*3/uL (ref 0.1–1.0)
Monocytes Relative: 19 %
Neutro Abs: 2.4 10*3/uL (ref 1.7–7.7)
Neutrophils Relative %: 66 %
Platelets: 570 10*3/uL — ABNORMAL HIGH (ref 150–400)
RBC: 3.07 MIL/uL — ABNORMAL LOW (ref 3.87–5.11)
RDW: 16.3 % — ABNORMAL HIGH (ref 11.5–15.5)
WBC: 3.7 10*3/uL — ABNORMAL LOW (ref 4.0–10.5)
nRBC: 0 % (ref 0.0–0.2)

## 2021-05-02 MED ORDER — SODIUM CHLORIDE 0.9% FLUSH
10.0000 mL | INTRAVENOUS | Status: DC | PRN
  Administered 2021-05-02: 10 mL
  Filled 2021-05-02: qty 10

## 2021-05-02 MED ORDER — HEPARIN SOD (PORK) LOCK FLUSH 100 UNIT/ML IV SOLN
500.0000 [IU] | Freq: Once | INTRAVENOUS | Status: AC | PRN
  Administered 2021-05-02: 500 [IU]
  Filled 2021-05-02: qty 5

## 2021-05-02 MED ORDER — SODIUM CHLORIDE 0.9 % IV SOLN
10.0000 mg | Freq: Once | INTRAVENOUS | Status: AC
  Administered 2021-05-02: 10 mg via INTRAVENOUS
  Filled 2021-05-02: qty 10

## 2021-05-02 MED ORDER — DOXORUBICIN HCL LIPOSOMAL CHEMO INJECTION 2 MG/ML
40.0000 mg/m2 | Freq: Once | INTRAVENOUS | Status: AC
  Administered 2021-05-02: 80 mg via INTRAVENOUS
  Filled 2021-05-02: qty 40

## 2021-05-02 MED ORDER — DEXTROSE 5 % IV SOLN
Freq: Once | INTRAVENOUS | Status: AC
Start: 2021-05-02 — End: 2021-05-02
  Filled 2021-05-02: qty 250

## 2021-05-02 MED ORDER — SODIUM CHLORIDE 0.9 % IV SOLN
Freq: Once | INTRAVENOUS | Status: AC
  Filled 2021-05-02: qty 250

## 2021-05-02 MED ORDER — SODIUM CHLORIDE 0.9 % IV SOLN
300.0000 mg | Freq: Once | INTRAVENOUS | Status: AC
  Administered 2021-05-02: 300 mg via INTRAVENOUS
  Filled 2021-05-02: qty 300

## 2021-05-02 NOTE — Progress Notes (Signed)
Per Dr. [name] - patient to receive doxil and venofer. No blood transfusion today.  Okay to treat with elevated heart rate.

## 2021-05-03 ENCOUNTER — Ambulatory Visit: Payer: MEDICARE | Admitting: Family Medicine

## 2021-05-03 LAB — CANCER ANTIGEN 27.29: CA 27.29: 400.6 U/mL — ABNORMAL HIGH (ref 0.0–38.6)

## 2021-05-10 ENCOUNTER — Encounter: Payer: Self-pay | Admitting: Oncology

## 2021-05-10 ENCOUNTER — Ambulatory Visit: Payer: MEDICARE | Admitting: Nurse Practitioner

## 2021-05-10 MED ORDER — CIPROFLOXACIN HCL 500 MG PO TABS: 500.0000 mg | ORAL_TABLET | Freq: Two times a day (BID) | ORAL | 0 refills | Status: DC

## 2021-05-12 ENCOUNTER — Ambulatory Visit (INDEPENDENT_AMBULATORY_CARE_PROVIDER_SITE_OTHER): Payer: MEDICARE | Admitting: Family Medicine

## 2021-05-12 ENCOUNTER — Other Ambulatory Visit: Payer: Self-pay

## 2021-05-12 ENCOUNTER — Encounter: Payer: Self-pay | Admitting: Family Medicine

## 2021-05-12 VITALS — BP 96/58 | HR 110 | Temp 98.0°F | Ht 64.0 in | Wt 190.0 lb

## 2021-05-12 DIAGNOSIS — N179 Acute kidney failure, unspecified: Secondary | ICD-10-CM

## 2021-05-12 DIAGNOSIS — C50919 Malignant neoplasm of unspecified site of unspecified female breast: Secondary | ICD-10-CM | POA: Diagnosis not present

## 2021-05-12 DIAGNOSIS — J90 Pleural effusion, not elsewhere classified: Secondary | ICD-10-CM

## 2021-05-12 DIAGNOSIS — E871 Hypo-osmolality and hyponatremia: Secondary | ICD-10-CM

## 2021-05-12 DIAGNOSIS — I959 Hypotension, unspecified: Secondary | ICD-10-CM | POA: Diagnosis not present

## 2021-05-12 DIAGNOSIS — G252 Other specified forms of tremor: Secondary | ICD-10-CM

## 2021-05-12 DIAGNOSIS — R059 Cough, unspecified: Secondary | ICD-10-CM

## 2021-05-12 NOTE — Progress Notes (Signed)
[facility] [address] [place] [address] Dept: [phone] Dept Fax: [phone]  Office Visit  Subjective:    Patient ID: Mia [name], female    DOB: 1956-01-16, 65 y.o..   MRN: [id number]  Chief Complaint  Patient presents with   Follow-up    Pt c/o low bp, with passsing out. Bp ranges 85/81-114/70.    History of Present Illness:  Patient is in today for evaluation of ongoing issues with hypotension. Ms. Labra has a history of stage IV, ER+ breast cancer with metastases to her lungs and spine. She was treated with mastectomy and has undergone multiple different chemotherapeutics and has had multiple complications of these therapies. She is currently on a pegylated doxorubicin. She is managed by Dr. [name].   Ms. Heinle has a history of hypotension. More recently this has progressively worsened. She notes that some years ago, she had hypertension. She was managed on amlodipine, losartan, and metoprolol. For several years, she has had episodes of lightheadedness and occasional syncopal spells. She notes she was told she had a vasovagal issue. About 10 days ago, Dr. [name] stopped her amlodipine and losartan. She has been monitoring her blood pressures at home They are averaging 76-122/51-70.   Review of her record shows Ms. Klingel has had prior hyponatremia and prior AKI. She has a history fo recurrent pleural effusions and has 500 mls removed every 2 weeks.   Ms. Shands also notes a tremor that has gradually been worsening. She does not note this at rest.  Ms. Millirons typically will have cough for a few days after she has pleural fluid removed. However, in the past week,s he has had a more persistent cough. This is not necessarily productive.  Past Medical History: Patient Active Problem List   Diagnosis Date Noted   Port-A-Cath in place 04/11/2021   Iron deficiency anemia 04/11/2021   Pleural  effusion 01/05/2021   Acute respiratory failure with hypoxia ([place]) 01/05/2021   Hypotension 01/05/2021   AKI (acute kidney injury) ([place]) 01/05/2021   Hyponatremia 01/05/2021   Neutropenia ([place]) 01/05/2021   Shoulder pain, right 01/05/2021   Goals of care, counseling/discussion 09/29/2019   Paronychia of thumb, left 02/03/2019   Drug-induced neutropenia ([place]) 12/04/2018   HTN (hypertension) 06/03/2018   HLD (hyperlipidemia) 06/03/2018   Metastatic breast cancer ([place]) 03/12/2018   Bone metastases ([place]) 03/12/2018   Lung metastases ([place]) 03/12/2018   Osteonecrosis ([place]) 03/12/2018   Peripheral neuropathy due to chemotherapy ([place]) 03/12/2018   Diabetes mellitus type 2 in obese ([place]) 03/12/2018   Hepatic steatosis 03/12/2018   Aortic atherosclerosis ([place]) 03/12/2018   Past Surgical History:  Procedure Laterality Date   CATARACT EXTRACTION Left    HUMERUS IM NAIL Right 01/11/2021   Procedure: INTRAMEDULLARY (IM) NAIL HUMERAL;  Surgeon: Hiram Gash, MD;  Location: [facility];  Service: Orthopedics;  Laterality: Right;   HYSTERECTOMY ABDOMINAL WITH SALPINGO-OOPHORECTOMY     IR IMAGING GUIDED PORT INSERTION  03/30/2021   IR PERC PLEURAL DRAIN W/INDWELL CATH W/IMG GUIDE  02/10/2021   MODIFIED RADICAL MASTECTOMY Right    Family History  Problem Relation Age of Onset   Diabetes Mother    Breast cancer Mother 89   Cerebral aneurysm Mother    Pulmonary embolism Father    Colon cancer Father 37   Breast cancer Sister 42       noninvasive   Breast cancer Paternal Grandmother 43   Outpatient Medications Prior to Visit  Medication  Sig Dispense Refill   blood glucose meter kit and supplies KIT Dispense based on patient and insurance preference. Use up to four times daily as directed. 1 each 0   ciprofloxacin (CIPRO) 500 MG tablet Take 1 tablet (500 mg total) by mouth 2 (two) times daily. 14 tablet 0   fenofibrate (TRICOR) 145 MG tablet TAKE 1 TABLET BY MOUTH EVERY DAY (Patient taking  differently: Take 145 mg by mouth daily.) 90 tablet 2   fluticasone (FLONASE) 50 MCG/ACT nasal spray SPRAY 2 SPRAYS INTO EACH NOSTRIL EVERY DAY 48 mL 1   gabapentin (NEURONTIN) 300 MG capsule Take 1 capsule (300 mg total) by mouth 4 (four) times daily. Pt is only taking 2 capsules a day (Patient taking differently: Take 300 mg by mouth 2 (two) times daily.) 180 capsule 0   glucose blood ([employer] GLUCOSE METER TEST STRIPS) test strip Use as instructed 100 each 12   glucose blood test strip E11.69 Use as instructed 100 each 12   ibuprofen (ADVIL) 800 MG tablet Take 800 mg by mouth every 8 (eight) hours as needed for moderate pain. 30 tablet 0   Insulin Admin Supplies MISC Inject 24 Units into the skin daily.     insulin glargine (LANTUS SOLOSTAR) 100 UNIT/ML Solostar Pen INJECT 24 UNITS INTO THE SKIN DAILY 15 mL 1   Insulin Pen Needle 31G X 5 MM MISC UAD for Sq inj for DM qd 100 each PRN   JANUMET 50-1000 MG tablet TAKE 1 TABLET BY MOUTH EVERY DAY WITH BREAKFAST 90 tablet 1   Lancets (FREESTYLE) lancets Use as instructed 100 each 12   lidocaine-prilocaine (EMLA) cream Apply to affected area once 30 g 3   metoprolol succinate (TOPROL-XL) 25 MG 24 hr tablet TAKE 1 TABLET BY MOUTH EVERY DAY 90 tablet 1   ondansetron (ZOFRAN) 8 MG tablet Take 1 tablet (8 mg total) by mouth 2 (two) times daily as needed (Nausea or vomiting). 30 tablet 1   oxyCODONE-acetaminophen (PERCOCET) 10-325 MG tablet Take 1 tablet by mouth 2 (two) times daily as needed for pain. 30 tablet 0   pantoprazole (PROTONIX) 40 MG tablet TAKE 1 TABLET BY MOUTH EVERY DAY (Patient taking differently: Take 40 mg by mouth daily.) 90 tablet 0   pioglitazone (ACTOS) 15 MG tablet TAKE 1 TABLET BY MOUTH EVERY DAY 90 tablet 1   pravastatin (PRAVACHOL) 20 MG tablet TAKE 1 TABLET BY MOUTH EVERY DAY (Patient taking differently: Take 20 mg by mouth daily.) 90 tablet 2   rivaroxaban (XARELTO) 20 MG TABS tablet Take 1 tablet (20 mg total) by mouth daily with  supper. 30 tablet 3   No facility-administered medications prior to visit.   Allergies  Allergen Reactions   Morphine And Related Anaphylaxis     Objective:   Today's Vitals   05/12/21 1305  BP: (!) 96/58  Pulse: (!) 110  Temp: 98 F (36.7 C)  TempSrc: Temporal  SpO2: 98%  Weight: 190 lb (86.2 kg)  Height: '5\' 4"'  (1.626 m)   Body mass index is 32.61 kg/m.   General: Patient appears ill and worn down. No acute distress. HEENT: Complete alopecia. Lungs: Clear to auscultation bilaterally. No wheezing, rales or rhonchi. CV: RRR without murmurs or rubs. Pulses 2+ bilaterally. Extremities: No resting tremor. Tremor enhances with actions like reaching out to touch a   finger. Psych: Alert and oriented. Normal mood and affect.  Health Maintenance Due  Topic Date Due   Zoster Vaccines- Shingrix (1 of  2) Never done   Pneumococcal Vaccine 26-53 Years old (2 - PCV) 08/11/2009   MAMMOGRAM  06/03/2018   FOOT EXAM  06/04/2019   COVID-19 Vaccine (4 - Booster for Pfizer series) 11/16/2020   OPHTHALMOLOGY EXAM  12/02/2020   URINE MICROALBUMIN  02/16/2021   INFLUENZA VACCINE  05/01/2021     Assessment & Plan:   1. Hypotension, unspecified hypotension type The etiology of the hypotension is not clear at this point. I will check labs to screen for metabolic causes for this. This may also be a symptom of disease progression related ot her metastatic cancer, side effect of her chemotherapy (at least exacerbating an underlying tendency), other paraneoplastic syndrome.  - Comprehensive metabolic panel - TSH - Urinalysis, Routine w reflex microscopic  2. Hyponatremia Ms. Tipping has a history of hyponatremia. I will reassess this. Hyponatremia and hypotension could be due to an adrenal insufficiency. We will assess for this on above labs.  3. Metastatic breast cancer ([place]) Continue to follow with Dr. [name].  4. AKI (acute kidney injury) ([place]) We will reassess renal function on  above labs.  5. Pleural effusion The removal of pleural fluid may be causing fluid shifts or electrolyte imbalances that could be casing her hypotension.  6. Intention tremor The tremor appears to be an intention tremor. This may be a natural occurrence or could be related to her prior chemotherapies. I offered a referral to neurology, but she does not feel she need sit at the current time.  7. Cough Lungs are clear on exam today. We will follow this.  Haydee Salter, MD

## 2021-05-13 ENCOUNTER — Encounter: Payer: Self-pay | Admitting: Oncology

## 2021-05-13 LAB — COMPREHENSIVE METABOLIC PANEL
ALT: 11 IU/L (ref 0–32)
AST: 24 IU/L (ref 0–40)
Albumin/Globulin Ratio: 1.1 — ABNORMAL LOW (ref 1.2–2.2)
Albumin: 2.9 g/dL — ABNORMAL LOW (ref 3.8–4.8)
Alkaline Phosphatase: 197 IU/L — ABNORMAL HIGH (ref 44–121)
BUN/Creatinine Ratio: 20 (ref 12–28)
BUN: 14 mg/dL (ref 8–27)
Bilirubin Total: 0.3 mg/dL (ref 0.0–1.2)
CO2: 25 mmol/L (ref 20–29)
Calcium: 9.1 mg/dL (ref 8.7–10.3)
Chloride: 93 mmol/L — ABNORMAL LOW (ref 96–106)
Creatinine, Ser: 0.7 mg/dL (ref 0.57–1.00)
Globulin, Total: 2.7 g/dL (ref 1.5–4.5)
Glucose: 92 mg/dL (ref 65–99)
Potassium: 4.8 mmol/L (ref 3.5–5.2)
Sodium: 133 mmol/L — ABNORMAL LOW (ref 134–144)
Total Protein: 5.6 g/dL — ABNORMAL LOW (ref 6.0–8.5)
eGFR: 97 mL/min/{1.73_m2} (ref >59)

## 2021-05-13 LAB — TSH: TSH: 1.23 u[IU]/mL (ref 0.450–4.500)

## 2021-05-16 ENCOUNTER — Other Ambulatory Visit: Payer: MEDICARE

## 2021-05-16 ENCOUNTER — Other Ambulatory Visit: Payer: Self-pay

## 2021-05-16 DIAGNOSIS — I959 Hypotension, unspecified: Secondary | ICD-10-CM

## 2021-05-17 LAB — URINALYSIS, ROUTINE W REFLEX MICROSCOPIC
Bilirubin, UA: NEGATIVE
Glucose, UA: NEGATIVE
Ketones, UA: NEGATIVE
Leukocytes,UA: NEGATIVE
Nitrite, UA: NEGATIVE
Protein,UA: NEGATIVE
RBC, UA: NEGATIVE
Specific Gravity, UA: 1.016 (ref 1.005–1.030)
Urobilinogen, Ur: 0.2 mg/dL (ref 0.2–1.0)
pH, UA: 6 (ref 5.0–7.5)

## 2021-05-22 ENCOUNTER — Encounter: Payer: Self-pay | Admitting: Family Medicine

## 2021-05-22 ENCOUNTER — Ambulatory Visit (INDEPENDENT_AMBULATORY_CARE_PROVIDER_SITE_OTHER): Payer: MEDICARE | Admitting: Family Medicine

## 2021-05-22 ENCOUNTER — Other Ambulatory Visit: Payer: Self-pay

## 2021-05-22 VITALS — BP 98/70 | HR 129 | Temp 97.6°F | Ht 64.0 in | Wt 190.0 lb

## 2021-05-22 DIAGNOSIS — E46 Unspecified protein-calorie malnutrition: Secondary | ICD-10-CM

## 2021-05-22 DIAGNOSIS — E8809 Other disorders of plasma-protein metabolism, not elsewhere classified: Secondary | ICD-10-CM

## 2021-05-22 DIAGNOSIS — K219 Gastro-esophageal reflux disease without esophagitis: Secondary | ICD-10-CM | POA: Insufficient documentation

## 2021-05-22 DIAGNOSIS — C50919 Malignant neoplasm of unspecified site of unspecified female breast: Secondary | ICD-10-CM

## 2021-05-22 DIAGNOSIS — I950 Idiopathic hypotension: Secondary | ICD-10-CM

## 2021-05-22 MED ORDER — PANTOPRAZOLE SODIUM 40 MG PO TBEC: 40.0000 mg | DELAYED_RELEASE_TABLET | Freq: Every day | ORAL | 3 refills | Status: AC

## 2021-05-22 MED FILL — Dexamethasone Sodium Phosphate Inj 100 MG/10ML: INTRAMUSCULAR | Qty: 1 | Status: AC

## 2021-05-22 NOTE — Progress Notes (Signed)
[facility] [address] [place] [address] Dept: [phone] Dept Fax: [phone]  Office Visit  Subjective:    Patient ID: Shann Medal, female    DOB: 1956-01-02, 65 y.o..   MRN: [id number]  Chief Complaint  Patient presents with   Follow-up    1 wk f/u hypotension    History of Present Illness:  Patient is in today for reassessment of her hypotension.  Ms. Pardue has a history of stage IV, ER+ breast cancer with metastases to her lungs and spine. She was treated with mastectomy and has undergone multiple different chemotherapeutics and has had multiple complications of these therapies. She is currently on a pegylated doxorubicin. She is managed by Dr. [name]. Ms. Munsch has a history of hypotension. More recently this has progressively worsened. She had been managed on amlodipine, losartan, and metoprolol for hypertension. For several years, she has had episodes of lightheadedness and occasional syncopal spells. She notes she was told she had a vasovagal issue. Earlier this month, Dr. [name] stopped her amlodipine and losartan. I had seen her last week to evaluate for other possible causes. She has continued to note low pressures at times.  Ms. Kareem health has deteriorated over the past year. Her husband notes that 8 months ago, she was fairly independent, driving herself places. Since then, she did develop the issue with a malignant pleural effusion, requiring placement of a pleural catheter and repeat pleurocentesis. She is also on continuous oxygen now. Ms. Dianna Limbo diet has been more carbohydrate based on her current chemotherapy. Her husband is worried as he sees her muscles getting smaller and notes she is not able to do as many activities as before.  Past Medical History: Patient Active Problem List   Diagnosis Date Noted   Port-A-Cath in place 04/11/2021   Iron deficiency anemia 04/11/2021    Pleural effusion 01/05/2021   Acute respiratory failure with hypoxia ([place]) 01/05/2021   Hypotension 01/05/2021   AKI (acute kidney injury) ([place]) 01/05/2021   Hyponatremia 01/05/2021   Neutropenia ([place]) 01/05/2021   Shoulder pain, right 01/05/2021   Goals of care, counseling/discussion 09/29/2019   Paronychia of thumb, left 02/03/2019   Drug-induced neutropenia ([place]) 12/04/2018   HTN (hypertension) 06/03/2018   HLD (hyperlipidemia) 06/03/2018   Metastatic breast cancer ([place]) 03/12/2018   Bone metastases ([place]) 03/12/2018   Lung metastases ([place]) 03/12/2018   Osteonecrosis ([place]) 03/12/2018   Peripheral neuropathy due to chemotherapy ([place]) 03/12/2018   Diabetes mellitus type 2 in obese ([place]) 03/12/2018   Hepatic steatosis 03/12/2018   Aortic atherosclerosis ([place]) 03/12/2018   Past Surgical History:  Procedure Laterality Date   CATARACT EXTRACTION Left    HUMERUS IM NAIL Right 01/11/2021   Procedure: INTRAMEDULLARY (IM) NAIL HUMERAL;  Surgeon: Hiram Gash, MD;  Location: [facility];  Service: Orthopedics;  Laterality: Right;   HYSTERECTOMY ABDOMINAL WITH SALPINGO-OOPHORECTOMY     IR IMAGING GUIDED PORT INSERTION  03/30/2021   IR PERC PLEURAL DRAIN W/INDWELL CATH W/IMG GUIDE  02/10/2021   MODIFIED RADICAL MASTECTOMY Right    Family History  Problem Relation Age of Onset   Diabetes Mother    Breast cancer Mother 21   Cerebral aneurysm Mother    Pulmonary embolism Father    Colon cancer Father 11   Breast cancer Sister 52       noninvasive   Breast cancer Paternal Grandmother 48    Outpatient Medications Prior to Visit  Medication Sig Dispense Refill  blood glucose meter kit and supplies KIT Dispense based on patient and insurance preference. Use up to four times daily as directed. 1 each 0   ciprofloxacin (CIPRO) 500 MG tablet Take 1 tablet (500 mg total) by mouth 2 (two) times daily. 14 tablet 0   fenofibrate (TRICOR) 145 MG tablet TAKE 1 TABLET BY MOUTH EVERY DAY (Patient taking  differently: Take 145 mg by mouth daily.) 90 tablet 2   fluticasone (FLONASE) 50 MCG/ACT nasal spray SPRAY 2 SPRAYS INTO EACH NOSTRIL EVERY DAY 48 mL 1   gabapentin (NEURONTIN) 300 MG capsule Take 1 capsule (300 mg total) by mouth 4 (four) times daily. Pt is only taking 2 capsules a day (Patient taking differently: Take 300 mg by mouth 2 (two) times daily.) 180 capsule 0   glucose blood ([employer]S GLUCOSE METER TEST STRIPS) test strip Use as instructed 100 each 12   glucose blood test strip E11.69 Use as instructed 100 each 12   ibuprofen (ADVIL) 800 MG tablet Take 800 mg by mouth every 8 (eight) hours as needed for moderate pain. 30 tablet 0   Insulin Admin Supplies MISC Inject 24 Units into the skin daily.     insulin glargine (LANTUS SOLOSTAR) 100 UNIT/ML Solostar Pen INJECT 24 UNITS INTO THE SKIN DAILY 15 mL 1   Insulin Pen Needle 31G X 5 MM MISC UAD for Sq inj for DM qd 100 each PRN   JANUMET 50-1000 MG tablet TAKE 1 TABLET BY MOUTH EVERY DAY WITH BREAKFAST 90 tablet 1   Lancets (FREESTYLE) lancets Use as instructed 100 each 12   lidocaine-prilocaine (EMLA) cream Apply to affected area once 30 g 3   ondansetron (ZOFRAN) 8 MG tablet Take 1 tablet (8 mg total) by mouth 2 (two) times daily as needed (Nausea or vomiting). 30 tablet 1   oxyCODONE-acetaminophen (PERCOCET) 10-325 MG tablet Take 1 tablet by mouth 2 (two) times daily as needed for pain. 30 tablet 0   pantoprazole (PROTONIX) 40 MG tablet TAKE 1 TABLET BY MOUTH EVERY DAY (Patient taking differently: Take 40 mg by mouth daily.) 90 tablet 0   pioglitazone (ACTOS) 15 MG tablet TAKE 1 TABLET BY MOUTH EVERY DAY 90 tablet 1   pravastatin (PRAVACHOL) 20 MG tablet TAKE 1 TABLET BY MOUTH EVERY DAY (Patient taking differently: Take 20 mg by mouth daily.) 90 tablet 2   rivaroxaban (XARELTO) 20 MG TABS tablet Take 1 tablet (20 mg total) by mouth daily with supper. 30 tablet 3   metoprolol succinate (TOPROL-XL) 25 MG 24 hr tablet TAKE 1 TABLET BY MOUTH  EVERY DAY 90 tablet 1   No facility-administered medications prior to visit.   Allergies  Allergen Reactions   Morphine And Related Anaphylaxis   Objective:   Today's Vitals   05/22/21 1538  BP: 98/70  Pulse: (!) 129  Temp: 97.6 F (36.4 C)  TempSrc: Temporal  SpO2: 98%  Weight: 190 lb (86.2 kg)  Height: '5\' 4"'  (1.626 m)   Body mass index is 32.61 kg/m.   General: Mildly cachectic. No acute distress. Extremities: Puffy edema to the right hand. Psych: Alert and oriented. Normal mood and affect.  Health Maintenance Due  Topic Date Due   Zoster Vaccines- Shingrix (1 of 2) Never done   Pneumococcal Vaccine 62-48 Years old (2 - PCV) 08/11/2009   MAMMOGRAM  06/03/2018   FOOT EXAM  06/04/2019   COVID-19 Vaccine (4 - Booster for Pfizer series) 11/16/2020   OPHTHALMOLOGY EXAM  12/02/2020  URINE MICROALBUMIN  02/16/2021   INFLUENZA VACCINE  05/01/2021   Lab results: CMP Latest Ref Rng & Units 05/12/2021 05/02/2021 04/11/2021  Glucose 65 - 99 mg/dL 92 121(H) 121(H)  BUN 8 - 27 mg/dL '14 14 18  ' Creatinine 0.57 - 1.00 mg/dL 0.70 0.66 0.71  Sodium 134 - 144 mmol/L 133(L) 134(L) 138  Potassium 3.5 - 5.2 mmol/L 4.8 4.5 4.4  Chloride 96 - 106 mmol/L 93(L) 95(L) 99  CO2 20 - 29 mmol/L '25 29 29  ' Calcium 8.7 - 10.3 mg/dL 9.1 9.4 9.6  Total Protein 6.0 - 8.5 g/dL 5.6(L) 7.0 6.5  Total Bilirubin 0.0 - 1.2 mg/dL 0.3 0.4 0.3  Alkaline Phos 44 - 121 IU/L 197(H) 152(H) 142(H)  AST 0 - 40 IU/L '24 19 16  ' ALT 0 - 32 IU/L '11 14 10   ' Lab Results  Component Value Date   TSH 1.230 05/12/2021   Lab Results  Component Value Date   WBC 3.7 (L) 05/02/2021   HGB 8.1 (L) 05/02/2021   HCT 26.6 (L) 05/02/2021   MCV 86.6 05/02/2021   PLT 570 (H) 05/02/2021    Assessment & Plan:   1. Idiopathic hypotension Etiology of the hypotension remains elusive. This may also be a symptom of disease progression related to her metastatic cancer, side effect of her chemotherapy (at least exacerbating an  underlying tendency), or other paraneoplastic syndrome. I did ask her to bring this up with her oncologist this week, to consider other potential causes.  2. Hypoalbuminemia due to protein-calorie malnutrition ([place]) I am concerned with malnourishment in light of her low albumin. We discussed adding a meal replacement/supplement with higher protein to help improve her nutrition. As she needs dairy free products, I recommended they check with a health food store or [employer].  3. Metastatic breast cancer ([place]) Continue  to follow with oncology.  Haydee Salter, MD

## 2021-05-23 ENCOUNTER — Inpatient Hospital Stay (HOSPITAL_BASED_OUTPATIENT_CLINIC_OR_DEPARTMENT_OTHER): Payer: MEDICARE | Admitting: Oncology

## 2021-05-23 ENCOUNTER — Inpatient Hospital Stay: Payer: MEDICARE

## 2021-05-23 VITALS — BP 118/71 | HR 132 | Temp 97.7°F | Resp 20

## 2021-05-23 DIAGNOSIS — C50911 Malignant neoplasm of unspecified site of right female breast: Secondary | ICD-10-CM | POA: Diagnosis not present

## 2021-05-23 DIAGNOSIS — C7951 Secondary malignant neoplasm of bone: Secondary | ICD-10-CM

## 2021-05-23 DIAGNOSIS — C50919 Malignant neoplasm of unspecified site of unspecified female breast: Secondary | ICD-10-CM

## 2021-05-23 DIAGNOSIS — Z95828 Presence of other vascular implants and grafts: Secondary | ICD-10-CM

## 2021-05-23 DIAGNOSIS — C7800 Secondary malignant neoplasm of unspecified lung: Secondary | ICD-10-CM

## 2021-05-23 DIAGNOSIS — Z5111 Encounter for antineoplastic chemotherapy: Secondary | ICD-10-CM | POA: Diagnosis not present

## 2021-05-23 DIAGNOSIS — M879 Osteonecrosis, unspecified: Secondary | ICD-10-CM

## 2021-05-23 DIAGNOSIS — Z7189 Other specified counseling: Secondary | ICD-10-CM

## 2021-05-23 DIAGNOSIS — Z170 Estrogen receptor positive status [ER+]: Secondary | ICD-10-CM | POA: Diagnosis not present

## 2021-05-23 DIAGNOSIS — C779 Secondary and unspecified malignant neoplasm of lymph node, unspecified: Secondary | ICD-10-CM | POA: Diagnosis not present

## 2021-05-23 LAB — COMPREHENSIVE METABOLIC PANEL
ALT: 7 U/L (ref 0–44)
AST: 20 U/L (ref 15–41)
Albumin: 2.3 g/dL — ABNORMAL LOW (ref 3.5–5.0)
Alkaline Phosphatase: 121 U/L (ref 38–126)
Anion gap: 11 (ref 5–15)
BUN: 11 mg/dL (ref 8–23)
CO2: 29 mmol/L (ref 22–32)
Calcium: 9.2 mg/dL (ref 8.9–10.3)
Chloride: 94 mmol/L — ABNORMAL LOW (ref 98–111)
Creatinine, Ser: 0.71 mg/dL (ref 0.44–1.00)
GFR, Estimated: >60 mL/min (ref >60)
Glucose, Bld: 107 mg/dL — ABNORMAL HIGH (ref 70–99)
Potassium: 4.2 mmol/L (ref 3.5–5.1)
Sodium: 134 mmol/L — ABNORMAL LOW (ref 135–145)
Total Bilirubin: 0.4 mg/dL (ref 0.3–1.2)
Total Protein: 6.3 g/dL — ABNORMAL LOW (ref 6.5–8.1)

## 2021-05-23 LAB — CBC WITH DIFFERENTIAL/PLATELET
Abs Immature Granulocytes: 0.15 10*3/uL — ABNORMAL HIGH (ref 0.00–0.07)
Basophils Absolute: 0 10*3/uL (ref 0.0–0.1)
Basophils Relative: 1 %
Eosinophils Absolute: 0 10*3/uL (ref 0.0–0.5)
Eosinophils Relative: 1 %
HCT: 25.4 % — ABNORMAL LOW (ref 36.0–46.0)
Hemoglobin: 7.8 g/dL — ABNORMAL LOW (ref 12.0–15.0)
Immature Granulocytes: 4 %
Lymphocytes Relative: 9 %
Lymphs Abs: 0.3 10*3/uL — ABNORMAL LOW (ref 0.7–4.0)
MCH: 26.5 pg (ref 26.0–34.0)
MCHC: 30.7 g/dL (ref 30.0–36.0)
MCV: 86.4 fL (ref 80.0–100.0)
Monocytes Absolute: 0.9 10*3/uL (ref 0.1–1.0)
Monocytes Relative: 25 %
Neutro Abs: 2.2 10*3/uL (ref 1.7–7.7)
Neutrophils Relative %: 60 %
Platelets: 563 10*3/uL — ABNORMAL HIGH (ref 150–400)
RBC: 2.94 MIL/uL — ABNORMAL LOW (ref 3.87–5.11)
RDW: 18.2 % — ABNORMAL HIGH (ref 11.5–15.5)
WBC: 3.7 10*3/uL — ABNORMAL LOW (ref 4.0–10.5)
nRBC: 0 % (ref 0.0–0.2)

## 2021-05-23 MED ORDER — OXYCODONE-ACETAMINOPHEN 10-325 MG PO TABS: 1.0000 | ORAL_TABLET | Freq: Two times a day (BID) | ORAL | 0 refills | Status: AC | PRN

## 2021-05-23 MED ORDER — HEPARIN SOD (PORK) LOCK FLUSH 100 UNIT/ML IV SOLN
500.0000 [IU] | Freq: Once | INTRAVENOUS | Status: AC
  Administered 2021-05-23: 500 [IU]

## 2021-05-23 MED ORDER — PROCHLORPERAZINE MALEATE 10 MG PO TABS: 10.0000 mg | ORAL_TABLET | Freq: Four times a day (QID) | ORAL | 1 refills | Status: DC | PRN

## 2021-05-23 MED ORDER — GABAPENTIN 300 MG PO CAPS: 300.0000 mg | ORAL_CAPSULE | Freq: Four times a day (QID) | ORAL | 0 refills | Status: AC

## 2021-05-23 MED ORDER — SODIUM CHLORIDE 0.9% FLUSH
10.0000 mL | Freq: Once | INTRAVENOUS | Status: AC
  Administered 2021-05-23: 10 mL

## 2021-05-23 MED ORDER — DEXAMETHASONE 4 MG PO TABS: 8.0000 mg | ORAL_TABLET | Freq: Every day | ORAL | 1 refills | Status: DC

## 2021-05-23 NOTE — Progress Notes (Signed)
DISCONTINUE ON PATHWAY REGIMEN - Breast     A cycle is every 28 days:     Liposomal doxorubicin   **Always confirm dose/schedule in your pharmacy ordering system**  REASON: Toxicities / Adverse Event PRIOR TREATMENT: [id number]: Liposomal Doxorubicin 50 mg/m2 q28 Days TREATMENT RESPONSE: Unable to Evaluate  START OFF PATHWAY REGIMEN - Breast   [id number]:Carboplatin AUC=2:   Administer weekly:     Carboplatin   **Always confirm dose/schedule in your pharmacy ordering system**  Patient Characteristics: Distant Metastases or Locoregional Recurrent Disease - Unresected or Locally Advanced Unresectable Disease Progressing after Neoadjuvant and Local Therapies, HER2 Negative/Unknown/Equivocal, ER Positive, Chemotherapy, Third Line and Beyond, Eribulin  Candidate Therapeutic Status: Distant Metastases ER Status: Positive (+) HER2 Status: Negative (-) PR Status: Positive (+) Therapy Approach Indicated: Standard Chemotherapy/Endocrine Therapy Line of Therapy: Third Line and Beyond Intent of Therapy: Non-Curative / Palliative Intent, Discussed with Patient

## 2021-05-23 NOTE — Progress Notes (Signed)
Mia [name]  Telephone:([phone]) [phone] Fax:(336) [phone]    ID: Mia [name] DOB: October 19, 1955  MR#: [id number]  OZY#:[id number]  Patient Care Team: Mia Salter, MD as PCP - General (Family Medicine) Mia [name], Mia Dad, MD as Consulting Physician (Oncology) OTHER MD: Mia Austria MD, Mia Sabal PA  CHIEF COMPLAINT: Estrogen receptor positive stage IV breast cancer (s/p right mastectomy)  CURRENT TREATMENT: Doxil, rivaroxaban, Venofer   INTERVAL HISTORY: Mia [name] returns today for follow-up of her estrogen receptor positive stage IV breast cancer.  She is accompanied by her husband  She was switched to Doxil on 04/11/2021.  She appeared to tolerate this well but she tells me for the last 2 or 3 weeks she has had great difficulty swallowing.  She has no sores in her mouth and she says her taste is okay.  It is not that food gets stuck is just an actual swallowing difficulty she cannot explain otherwise.  As result she is eating less and is losing weight.  Bowel movements are okay she says.  They are about twice a week.  Given the above symptoms she is adamant that she does not want any further Doxil treatment.   REVIEW OF SYSTEMS: Mia [name] tells me her pain continues to be well controlled.  She takes Percocet once a day.  She also takes gabapentin which is helpful.  All her blood pressure medications have been stopped.  Nevertheless her blood pressure remains low and she is at risk of falls.  She has however not actually fallen.  She is very careful to use her walker at home.  A detailed review of systems was otherwise stable.   COVID 19 VACCINATION STATUS: [place] x2, with booster 08/16/2020   HISTORY OF CURRENT ILLNESS: From the original intake note:  We have reviewed the medical records from [place], which is the source of the information below:  Mia [name] was initially diagnosed with Stage IIIA (T2N2M0) invasive ductal carcinoma, estrogen  receptor positive and HER2 negative right breast cancer in 2000. She underwent right mastectomy, with 4 positive metastatic lymph nodes. She had chemotherapy with taxol and cytoxan and fulvestrant in the past. She had radiation and completed 5 years of tamoxifen.   She was diagnosed with Stage IV disease 04/18/2005 with metastases to the bone, lung, and additional nodes. She was on capecitabine but was discontinued after rising CA 27-29 and scans.   She started anastrozole and everolimus with Delton See in April 2014. She tolerated this well, but this was discontinued in July 2014 due to abnormal blood tests, hyperlipidemia and hyperglycemia.  She began Abraxane 17m /m2 and Xgeva in August 2014 weekly x3 with 1 week off. She tolerated this treatment well. She discontinued Xgeva January 2016 due to osteonecrosis of the jaw and mouth issues. She continued abraxane alone until her last dose on 11/18/2015 due to neuropathy and disease progression with restaging studies on 11/23/2015 with a CT chest abdomen and pelvis and one scan showing skull, sternal and femoral lesions.   She was switched to gemcitabine starting 01/20/2016 weekly x3 and 1 week off. This was discontinued on 06/15/2016 due to a port related jugular clot on the right side. She was given Lovenox for 3 months.  She was switched to Letrozole 2.5 mg and palbociclib 125 mg starting 07/13/2016. She tolerated this well. Restaging studies with CT chest abdomen and pelvis and bone scan on 07/01/2017 showed stable/ improving lesions. CA 27-29 was stable.  During the last few months of follow  up in [place], she completed restaging studies with a CT chest abdomen and pelvis at [facility] on 12/17/2017 showing: A 6 mm pulmonary nodule in the left upper lobe laterally. Progression of metastatic disease to the bones at the L4 and L5 vertebral bodies. Sclerotic metastases at T8, T10, an T11, are similar to previous exams. Sclerotic metastases in the sternum  stable. Hepatic steatosis. Aortic atherosclerosis.  Most recent CA-45-29 (January 2019) was 58.  Most recent hemoglobin A1c was 7.1 according to the patient  The patient's subsequent history is as detailed below.   PAST MEDICAL HISTORY: Past Medical History:  Diagnosis Date   Breast cancer metastasized to bone [facility])    Diabetes mellitus without complication ([place])    Dyspnea    History of radiation therapy 11/18/2018-12/02/2018   lumbar spine and pelvis   Dr Mia [name]   History of radiation therapy 11/08/2020-11/18/2020   right arm   Dr Mia [name]   Hyperlipidemia    Hypertension    Lymphedema of right arm    Neuropathy associated with cancer ([place])    Obesity (BMI 35.0-39.9 without comorbidity)    PONV (postoperative nausea and vomiting)    Pulmonary embolism ([place]) 12/2020     PAST SURGICAL HISTORY: Past Surgical History:  Procedure Laterality Date   CATARACT EXTRACTION Left    HUMERUS IM NAIL Right 01/11/2021   Procedure: INTRAMEDULLARY (IM) NAIL HUMERAL;  Surgeon: Hiram Gash, MD;  Location: [facility];  Service: Orthopedics;  Laterality: Right;   HYSTERECTOMY ABDOMINAL WITH SALPINGO-OOPHORECTOMY     IR IMAGING GUIDED PORT INSERTION  03/30/2021   IR PERC PLEURAL DRAIN W/INDWELL CATH W/IMG GUIDE  02/10/2021   MODIFIED RADICAL MASTECTOMY Right      FAMILY HISTORY: Family History  Problem Relation Age of Onset   Diabetes Mother    Breast cancer Mother 59   Cerebral aneurysm Mother    Pulmonary embolism Father    Colon cancer Father 28   Breast cancer Sister 22       noninvasive   Breast cancer Paternal Grandmother 76   The patient reports she had negative genetic testing at [facility] 2014, report not available. --The patient's father died at age 50 due to a PE, and he also had a history of colon cancer diagnosed at age 26. The patient's mother died at age 68 due to diabetes and survived a cerebral aneurysm. The patient's mother also had a history of breast cancer  diagnosed at age 65.  The patient has no brothers and 1 sister. The patient's sister was diagnosed with non invasive breast cancer at age 64. There was a paternal grandmother diagnosed with breast cancer at age 74. The patient denies a family history of ovarian cancer.    GYNECOLOGIC HISTORY:  Patient's last menstrual period was 10/01/2006. Menarche: 65 years old Age at first live birth: 65 years old She is [place] P2.  She is status post hysterectomy with bilateral salpingo- oophorectomy 12/23/2006 with benign pathology ([id number]) She never used HRT.    SOCIAL HISTORY:  Mia [name] is disabled due to her breast cancer. She used to be Surveyor, quantity for a cardiology office. Her husband, [name] is in the process of retiring. He is a Electrical engineer for a power company. The patient's daughter, [name], lives in [place] and works as a Teaching laboratory technician. The patient's second daughter, [name] is a stay at home mom and is a special needs teacher, who will soon move to [facility]).  The patient plans on attending [employer].     ADVANCED DIRECTIVES: In the absence of any documentation to the contrary, the patient's spouse is their HCPOA.    HEALTH MAINTENANCE: Social History   Tobacco Use   Smoking status: Never   Smokeless tobacco: Never  Vaping Use   Vaping Use: Never used  Substance Use Topics   Alcohol use: Not Currently   Drug use: Never    Colonoscopy: 2017   PAP:  Bone density: never  Mammogram: 2017   Allergies  Allergen Reactions   Morphine And Related Anaphylaxis    Current Outpatient Medications  Medication Sig Dispense Refill   blood glucose meter kit and supplies KIT Dispense based on patient and insurance preference. Use up to four times daily as directed. 1 each 0   ciprofloxacin (CIPRO) 500 MG tablet Take 1 tablet (500 mg total) by mouth 2 (two) times daily. 14 tablet 0   fenofibrate (TRICOR) 145 MG tablet TAKE 1 TABLET BY MOUTH  EVERY DAY (Patient taking differently: Take 145 mg by mouth daily.) 90 tablet 2   fluticasone (FLONASE) 50 MCG/ACT nasal spray SPRAY 2 SPRAYS INTO EACH NOSTRIL EVERY DAY 48 mL 1   gabapentin (NEURONTIN) 300 MG capsule Take 1 capsule (300 mg total) by mouth 4 (four) times daily. Pt is only taking 2 capsules a day (Patient taking differently: Take 300 mg by mouth 2 (two) times daily.) 180 capsule 0   glucose blood ([employer] GLUCOSE METER TEST STRIPS) test strip Use as instructed 100 each 12   glucose blood test strip E11.69 Use as instructed 100 each 12   ibuprofen (ADVIL) 800 MG tablet Take 800 mg by mouth every 8 (eight) hours as needed for moderate pain. 30 tablet 0   Insulin Admin Supplies MISC Inject 24 Units into the skin daily.     insulin glargine (LANTUS SOLOSTAR) 100 UNIT/ML Solostar Pen INJECT 24 UNITS INTO THE SKIN DAILY 15 mL 1   Insulin Pen Needle 31G X 5 MM MISC UAD for Sq inj for DM qd 100 each PRN   JANUMET 50-1000 MG tablet TAKE 1 TABLET BY MOUTH EVERY DAY WITH BREAKFAST 90 tablet 1   Lancets (FREESTYLE) lancets Use as instructed 100 each 12   lidocaine-prilocaine (EMLA) cream Apply to affected area once 30 g 3   ondansetron (ZOFRAN) 8 MG tablet Take 1 tablet (8 mg total) by mouth 2 (two) times daily as needed (Nausea or vomiting). 30 tablet 1   oxyCODONE-acetaminophen (PERCOCET) 10-325 MG tablet Take 1 tablet by mouth 2 (two) times daily as needed for pain. 30 tablet 0   pantoprazole (PROTONIX) 40 MG tablet Take 1 tablet (40 mg total) by mouth daily. 90 tablet 3   pioglitazone (ACTOS) 15 MG tablet TAKE 1 TABLET BY MOUTH EVERY DAY 90 tablet 1   pravastatin (PRAVACHOL) 20 MG tablet TAKE 1 TABLET BY MOUTH EVERY DAY (Patient taking differently: Take 20 mg by mouth daily.) 90 tablet 2   rivaroxaban (XARELTO) 20 MG TABS tablet Take 1 tablet (20 mg total) by mouth daily with supper. 30 tablet 3   No current facility-administered medications for this visit.    OBJECTIVE: White woman  examined in a wheelchair Vitals:   05/23/21 1151  BP: 118/71  [name]: (!) 132  Resp: 20  Temp: 97.7 F (36.5 C)  SpO2: 100%    Wt Readings from Last 3 Encounters:  05/22/21 190 lb (86.2 kg)  05/12/21 190 lb (86.2 kg)  04/11/21 178 lb 8 oz (81 kg)   There is no height or weight on file to calculate BMI.   ECOG FS:2 - Symptomatic, <50% confined to bed  Sclerae unicteric, EOMs intact Wearing a mask; bitemporal wasting present Lungs no rales or rhonchi Heart regular rate and rhythm Abd soft, nontender, positive bowel sounds MSK no focal spinal tenderness Neuro: nonfocal, well oriented, appropriate affect Breasts: Deferred   LAB RESULTS:  CMP   No results found for: TOTALPROTELP, ALBUMINELP, A1GS, A2GS, BETS, BETA2SER, GAMS, MSPIKE, SPEI  No results found for: KPAFRELGTCHN, LAMBDASER, KAPLAMBRATIO  Lab Results  Component Value Date   WBC 3.7 (L) 05/23/2021   NEUTROABS 2.2 05/23/2021   HGB 7.8 (L) 05/23/2021   HCT 25.4 (L) 05/23/2021   MCV 86.4 05/23/2021   PLT 563 (H) 05/23/2021    No results found for: LABCA2  No components found for: [id number]  No results for input(s): INR in the last 168 hours.  No results found for: LABCA2  No results found for: MIW803  No results found for: OZY248  No results found for: GNO037  Lab Results  Component Value Date   CA2729 400.6 (H) 05/02/2021    No components found for: HGQUANT  No results found for: CEA1 / No results found for: CEA1   No results found for: AFPTUMOR  No results found for: CHROMOGRNA  No results found for: HGBA, HGBA2QUANT, HGBFQUANT, HGBSQUAN (Hemoglobinopathy evaluation)   No results found for: LDH  No results found for: IRON, TIBC, IRONPCTSAT (Iron and TIBC)  No results found for: FERRITIN  Urinalysis    Component Value Date/Time   COLORURINE YELLOW 04/21/2021 1304   APPEARANCEUR Cloudy (A) 05/16/2021 1409   LABSPEC 1.012 04/21/2021 1304   PHURINE 5.0 04/21/2021 1304   GLUCOSEU  Negative 05/16/2021 1409   HGBUR MODERATE (A) 04/21/2021 1304   BILIRUBINUR Negative 05/16/2021 1409   KETONESUR NEGATIVE 04/21/2021 1304   PROTEINUR Negative 05/16/2021 1409   PROTEINUR 30 (A) 04/21/2021 1304   NITRITE Negative 05/16/2021 1409   NITRITE NEGATIVE 04/21/2021 1304   LEUKOCYTESUR Negative 05/16/2021 1409   LEUKOCYTESUR LARGE (A) 04/21/2021 1304    STUDIES: No results found.   Refuses mammography at this point (12/23/2019)   ELIGIBLE FOR AVAILABLE RESEARCH PROTOCOL: no   ASSESSMENT: 65 y.o. [place], [place] woman with stage IV breast cancer, as follows:  (1) status post right mastectomy in 2000, for a pT2 pN2, stage IIIA invasive ductal carcinoma, estrogen receptor positive, progesterone receptor not tested, HER-2 negative (0) by immunohistochemistry  (a) adjuvant chemotherapy with doxorubicin and cyclophosphamide in dose dense fashion x4 followed by paclitaxel in dose dense fashion x4  (b) adjuvant radiation: 30 doses  (c) antiestrogens: Tamoxifen for 5 years, completed 2005  (2) METASTASTIC DISEASE: 2008, involving bones, lungs, and lymph nodes  (a) CA 27-29 is informative  (b) CT of the chest abdomen and pelvis in [facility] finds stable sclerotic metastases (T8, T10, T11, sternum, L4, L5); 0.6 cm left upper lobe lung nodule stable  (3)  prior anti-estrogen treatments:  (a) fulvestrant--progression  (b) exemestane/everolimus--hyperlipidemia, hyperglycemia  (4) prior chemotherapy treatments:  (a) capecitabine: progression  (b) Abraxane, August 2014 through 11/18/2015: good response but stopped due to neuropathy  (c) gemcitabine 01/20/2016--?; multiple interruptions secondary to infections  (5) radiation therapy:  (a) T spine and Right femur, completed 01/10/2016  (6) letrozole/ palbociclib started 07/13/2016, discontinued 10/26/2020 with evidence of progression  (7) bone treatment:  (a) denosumab/Xgeva--discontinued January 2016 due to  osteonecrosis of the jaw  (8) cancer associated pain: Updated 12/01/2020 (a) OxyIR 5 mg QID PRN  (b) PMP Aware reviewed 12/01/2020  (c) OxyContin 20 mg p.o. 3 times daily  (d) bowel prophylaxis: MiraLAX as needed  (9) restaging studies:1  (a) mostly stable bone lesions with evidence of progression at L3-L5; no epidural tumor by total spinal MRI in November 2019  (b) no evidence of progressive lung, lymph node or liver lesions by CT scans of the chest abdomen and pelvis and bone scan December 2019  (c) CT scans abd/pelvis 03/31/2019 shows stable bone metastases, no extraosseous disease  (d) MRI pelvis 04/09/2019 shows bilateral ilium, left acetabulum and lumbar mets, stable  (e) CT abd/pelvis 09/01/2019 shows stable bone mets, slight increase left effusion  (f) CT of the chest 05/05/2020 again shows minimal increase in the left pleural effusion, possible resorption of bone around the left glenoid lesion, otherwise stable  (g) CT of the chest abdomen and pelvis 10/18/2020 again shows no evidence of visceral disease, but there is apparent progression in the bone disease, and some enlargement of the left pleural effusion.  The CA 27-29 has also been rising.  Accordingly her letrozole and palbociclib are being discontinued  (10) palliative radiation 11/18/2018 through 12/02/2018 Site/dose:   1. Lumbar spine; 35 Gy in 14 fractions of 2.5 Gy                      2. Pelvis; 35 Gy in 14 fractions of 2.5 Gy  (11) started cyclophosphamide, methotrexate and fluorouracil [CMF] 11/10/2020  (a) methotrexate held first dose, during palliative radiation  (B) discontinued after 3 cycles (last dose 12/22/2020)  (C) CT chest 01/23/2021 showed worsening left effusion, cytologically positive  (12) status post left thoracentesis 11/21/2020, with positive cytology  (a) tumor cells are strongly estrogen and progesterone receptor positive, HER-2 negative  (b) foundation 1 requested on the thoracentesis sample shows  a PI K3 CA mutation.  Microsatellite status is stable and the tumor mutation burden is low.  There are mutations in ESR 1, GAT A3, and Joffre 6  (C) left pleural drain placed 02/10/2021  (13) status post Right humeral shaft intramedullary nail fixation Right proximal humeral nonunion treatment 01/12/2021 [facility])  (14) alpelisib started January 27, 2021, fulvestrant started 01/26/2021  (a) alpelisib and fulvestrant discontinued June 2022 with HbAic of 9.7  (15) started Doxil 04/11/2021, repeated every 21 days  (a) echo 03/28/2021 shows an ejection fraction in the 60-65% range  (B) Doxil discontinued after 05/02/2021 (second) dose with difficulty swallowing  (16) to start carboplatin on 06/14/2021   PLAN: Mia [name] is having difficulty swallowing.  There is not something obvious like food getting stuck and is not related to mucositis.  It could be a functional deficit.  I have written for her to have a swallowing study and I discussed that with her and her husband who had many questions regarding that.  They understand that I do not think the swallowing is related to the Doxil treatments.  Nevertheless there has been no obvious improvement with the Doxil and changing to a different agent is certainly acceptable.  Mia [name] looks like she is declining to me.  I offered her referred to the palliative care service but they saw no benefit from that.  She is not a candidate for hospice at present because she wants to continue life-prolonging treatment.  We specifically discussed the possibility of carboplatin.  She tells me that at some point she  will say "Dr. I am done with therapy".  That point is not now she says.  We discussed that when she does say that she will still receive intensive support for symptoms.  She will simply be stopping life-prolonging treatments which are unlikely to be helpful in any case given how many prior treatments that she has already had.  She is quite anemic which I am sure is  contributing to her current condition.  However she did not want a transfusion today.  We have set her up for transfusion next week.  She will see me again on 06/13/2021 and if she is agreeable we will proceed to carboplatin the following day.  Total encounter time 35 minutes.*  Total encounter time 25 minutes.Sarajane Jews C. Shaivi Rothschild, MD 05/23/21 5:59 PM Medical Oncology and Hematology [facility] [place], [place] [address] Tel. [phone]    Fax. [phone]   I, Wilburn Mylar, am acting as scribe for Dr. Virgie [name]. Bradey Luzier.  I, Lurline Del MD, have reviewed the above documentation for accuracy and completeness, and I agree with the above.   *Total Encounter Time as defined by the Centers for Medicare and Medicaid Services includes, in addition to the face-to-face time of a patient visit (documented in the note above) non-face-to-face time: obtaining and reviewing outside history, ordering and reviewing medications, tests or procedures, care coordination (communications with other health care professionals or caregivers) and documentation in the medical record.

## 2021-05-23 NOTE — Progress Notes (Signed)
Patient stated that she is not receiving Chemotherapy today because her throat is swollen and thinks its a reaction from the chemo.  Flushed, drew labs and deaccessed her.

## 2021-05-24 ENCOUNTER — Other Ambulatory Visit: Payer: Self-pay | Admitting: *Deleted

## 2021-05-24 LAB — CANCER ANTIGEN 27.29: CA 27.29: 392.4 U/mL — ABNORMAL HIGH (ref 0.0–38.6)

## 2021-05-24 NOTE — Progress Notes (Unsigned)
whe

## 2021-05-25 ENCOUNTER — Encounter: Payer: Self-pay | Admitting: Oncology

## 2021-05-25 ENCOUNTER — Other Ambulatory Visit: Payer: Self-pay | Admitting: *Deleted

## 2021-05-25 NOTE — Progress Notes (Signed)
Per MD request per visit today - need for light weight wheelchair for use in the home. Due to her current status of metastatic breast cancer with bone involvement as well as severe anemia pt unable to safely ambulate in the home , a cane or walker will not provide appropriate assistance for safety.

## 2021-05-30 ENCOUNTER — Inpatient Hospital Stay: Payer: MEDICARE

## 2021-05-30 ENCOUNTER — Other Ambulatory Visit: Payer: Self-pay

## 2021-05-30 ENCOUNTER — Other Ambulatory Visit: Payer: Self-pay | Admitting: *Deleted

## 2021-05-30 DIAGNOSIS — C7951 Secondary malignant neoplasm of bone: Secondary | ICD-10-CM | POA: Diagnosis not present

## 2021-05-30 DIAGNOSIS — D508 Other iron deficiency anemias: Secondary | ICD-10-CM

## 2021-05-30 DIAGNOSIS — Z95828 Presence of other vascular implants and grafts: Secondary | ICD-10-CM

## 2021-05-30 DIAGNOSIS — C50919 Malignant neoplasm of unspecified site of unspecified female breast: Secondary | ICD-10-CM

## 2021-05-30 DIAGNOSIS — Z170 Estrogen receptor positive status [ER+]: Secondary | ICD-10-CM | POA: Diagnosis not present

## 2021-05-30 DIAGNOSIS — C50911 Malignant neoplasm of unspecified site of right female breast: Secondary | ICD-10-CM | POA: Diagnosis not present

## 2021-05-30 DIAGNOSIS — C779 Secondary and unspecified malignant neoplasm of lymph node, unspecified: Secondary | ICD-10-CM | POA: Diagnosis not present

## 2021-05-30 DIAGNOSIS — C7800 Secondary malignant neoplasm of unspecified lung: Secondary | ICD-10-CM | POA: Diagnosis not present

## 2021-05-30 DIAGNOSIS — Z5111 Encounter for antineoplastic chemotherapy: Secondary | ICD-10-CM | POA: Diagnosis not present

## 2021-05-30 LAB — COMPREHENSIVE METABOLIC PANEL
ALT: 13 U/L (ref 0–44)
AST: 32 U/L (ref 15–41)
Albumin: 2 g/dL — ABNORMAL LOW (ref 3.5–5.0)
Alkaline Phosphatase: 133 U/L — ABNORMAL HIGH (ref 38–126)
Anion gap: 10 (ref 5–15)
BUN: 11 mg/dL (ref 8–23)
CO2: 29 mmol/L (ref 22–32)
Calcium: 9.4 mg/dL (ref 8.9–10.3)
Chloride: 93 mmol/L — ABNORMAL LOW (ref 98–111)
Creatinine, Ser: 0.62 mg/dL (ref 0.44–1.00)
GFR, Estimated: >60 mL/min (ref >60)
Glucose, Bld: 126 mg/dL — ABNORMAL HIGH (ref 70–99)
Potassium: 3.8 mmol/L (ref 3.5–5.1)
Sodium: 132 mmol/L — ABNORMAL LOW (ref 135–145)
Total Bilirubin: 0.6 mg/dL (ref 0.3–1.2)
Total Protein: 5.9 g/dL — ABNORMAL LOW (ref 6.5–8.1)

## 2021-05-30 LAB — CBC WITH DIFFERENTIAL/PLATELET
Abs Immature Granulocytes: 0.6 10*3/uL — ABNORMAL HIGH (ref 0.00–0.07)
Basophils Absolute: 0.1 10*3/uL (ref 0.0–0.1)
Basophils Relative: 1 %
Eosinophils Absolute: 0.1 10*3/uL (ref 0.0–0.5)
Eosinophils Relative: 1 %
HCT: 26.1 % — ABNORMAL LOW (ref 36.0–46.0)
Hemoglobin: 8 g/dL — ABNORMAL LOW (ref 12.0–15.0)
Immature Granulocytes: 7 %
Lymphocytes Relative: 5 %
Lymphs Abs: 0.4 10*3/uL — ABNORMAL LOW (ref 0.7–4.0)
MCH: 26 pg (ref 26.0–34.0)
MCHC: 30.7 g/dL (ref 30.0–36.0)
MCV: 84.7 fL (ref 80.0–100.0)
Monocytes Absolute: 1.4 10*3/uL — ABNORMAL HIGH (ref 0.1–1.0)
Monocytes Relative: 15 %
Neutro Abs: 6.7 10*3/uL (ref 1.7–7.7)
Neutrophils Relative %: 71 %
Platelets: 556 10*3/uL — ABNORMAL HIGH (ref 150–400)
RBC: 3.08 MIL/uL — ABNORMAL LOW (ref 3.87–5.11)
RDW: 18.3 % — ABNORMAL HIGH (ref 11.5–15.5)
WBC: 9.3 10*3/uL (ref 4.0–10.5)
nRBC: 0.3 % — ABNORMAL HIGH (ref 0.0–0.2)

## 2021-05-30 LAB — PREPARE RBC (CROSSMATCH)

## 2021-05-30 MED ORDER — SODIUM CHLORIDE 0.9% IV SOLUTION
250.0000 mL | Freq: Once | INTRAVENOUS | Status: AC
  Administered 2021-05-30: 250 mL via INTRAVENOUS

## 2021-05-30 MED ORDER — HEPARIN SOD (PORK) LOCK FLUSH 100 UNIT/ML IV SOLN
500.0000 [IU] | Freq: Every day | INTRAVENOUS | Status: AC | PRN
  Administered 2021-05-30: 500 [IU]

## 2021-05-30 MED ORDER — DIPHENHYDRAMINE HCL 25 MG PO CAPS
25.0000 mg | ORAL_CAPSULE | Freq: Once | ORAL | Status: AC
  Administered 2021-05-30: 25 mg via ORAL
  Filled 2021-05-30: qty 1

## 2021-05-30 MED ORDER — SODIUM CHLORIDE 0.9% FLUSH
10.0000 mL | INTRAVENOUS | Status: AC | PRN
  Administered 2021-05-30: 10 mL

## 2021-05-30 MED ORDER — ACETAMINOPHEN 325 MG PO TABS
650.0000 mg | ORAL_TABLET | Freq: Once | ORAL | Status: AC
  Administered 2021-05-30: 650 mg via ORAL
  Filled 2021-05-30: qty 2

## 2021-05-30 MED ORDER — SODIUM CHLORIDE 0.9% FLUSH
10.0000 mL | Freq: Once | INTRAVENOUS | Status: AC
  Administered 2021-05-30: 10 mL

## 2021-05-30 NOTE — Progress Notes (Signed)
Blood orders entered. 

## 2021-05-30 NOTE — Patient Instructions (Signed)
https://www.[facility].org/donate-blood/blood-donation-process/what-happens-to-donated-blood/blood-transfusions/types-of-blood-transfusions.html"> https://www.[facility].org/donate-blood/blood-donation-process/what-happens-to-donated-blood/blood-transfusions/risks-complications.html">  Blood Transfusion, Adult, Care After This sheet gives you information about how to care for yourself after your procedure. Your health care provider may also give you more specific instructions. If you have problems or questions, contact your health careprovider. What can I expect after the procedure? After the procedure, it is common to have: Bruising and soreness where the IV was inserted. A fever or chills on the day of the procedure. This may be your body's response to the new blood cells received. A headache. Follow these instructions at home: IV insertion site care     Follow instructions from your health care provider about how to take care of your IV insertion site. Make sure you: Wash your hands with soap and water before and after you change your bandage (dressing). If soap and water are not available, use hand sanitizer. Change your dressing as told by your health care provider. Check your IV insertion site every day for signs of infection. Check for: Redness, swelling, or pain. Bleeding from the site. Warmth. Pus or a bad smell. General instructions Take over-the-counter and prescription medicines only as told by your health care provider. Rest as told by your health care provider. Return to your normal activities as told by your health care provider. Keep all follow-up visits as told by your health care provider. This is important. Contact a health care provider if: You have itching or red, swollen areas of skin (hives). You feel anxious. You feel weak after doing your normal activities. You have redness, swelling, warmth, or pain around the IV insertion site. You have blood coming  from the IV insertion site that does not stop with pressure. You have pus or a bad smell coming from your IV insertion site. Get help right away if: You have symptoms of a serious allergic or immune system reaction, including: Trouble breathing or shortness of breath. Swelling of the face or feeling flushed. Fever or chills. Pain in the head, back, or chest. Dark urine or blood in the urine. Widespread rash. Fast heartbeat. Feeling dizzy or light-headed. If you receive your blood transfusion in an outpatient setting, you will betold whom to contact to report any reactions. These symptoms may represent a serious problem that is an emergency. Do not wait to see if the symptoms will go away. Get medical help right away. Call your local emergency services (911 in the [place] Do not drive yourself to the hospital. Summary Bruising and tenderness around the IV insertion site are common. Check your IV insertion site every day for signs of infection. Rest as told by your health care provider. Return to your normal activities as told by your health care provider. Get help right away for symptoms of a serious allergic or immune system reaction to blood transfusion. This information is not intended to replace advice given to you by your health care provider. Make sure you discuss any questions you have with your healthcare provider. Document Revised: 03/12/2019 Document Reviewed: 03/12/2019 Elsevier Patient Education  2022 Elsevier Inc.  

## 2021-05-31 LAB — BPAM RBC
Blood Product Expiration Date: 202209272359
Blood Product Expiration Date: 202209272359
ISSUE DATE / TIME: 202208301051
ISSUE DATE / TIME: 202208301051
Unit Type and Rh: 600
Unit Type and Rh: 600

## 2021-05-31 LAB — TYPE AND SCREEN
ABO/RH(D): A NEG
Antibody Screen: NEGATIVE
Unit division: 0
Unit division: 0

## 2021-06-06 NOTE — Progress Notes (Signed)
Pharmacist Chemotherapy Monitoring - Initial Assessment    Anticipated start date: 06/13/21   The following has been reviewed per standard work regarding the patient's treatment regimen: The patient's diagnosis, treatment plan and drug doses, and organ/hematologic function Lab orders and baseline tests specific to treatment regimen  The treatment plan start date, drug sequencing, and pre-medications Prior authorization status  Patient's documented medication list, including drug-drug interaction screen and prescriptions for anti-emetics and supportive care specific to the treatment regimen The drug concentrations, fluid compatibility, administration routes, and timing of the medications to be used The patient's access for treatment and lifetime cumulative dose history, if applicable  The patient's medication allergies and previous infusion related reactions, if applicable   Changes made to treatment plan:  treatment plan date  Follow up needed:  N/A    Kennith Center, Pharm.D., CPP 06/06/2021@2 :17 PM

## 2021-06-08 ENCOUNTER — Encounter: Payer: Self-pay | Admitting: Oncology

## 2021-06-10 ENCOUNTER — Encounter: Payer: Self-pay | Admitting: Oncology

## 2021-06-11 ENCOUNTER — Encounter: Payer: Self-pay | Admitting: Oncology

## 2021-06-12 MED FILL — Dexamethasone Sodium Phosphate Inj 100 MG/10ML: INTRAMUSCULAR | Qty: 1 | Status: AC

## 2021-06-13 ENCOUNTER — Inpatient Hospital Stay: Payer: MEDICARE | Attending: Oncology

## 2021-06-13 ENCOUNTER — Inpatient Hospital Stay: Payer: MEDICARE

## 2021-06-13 ENCOUNTER — Inpatient Hospital Stay (HOSPITAL_BASED_OUTPATIENT_CLINIC_OR_DEPARTMENT_OTHER): Payer: MEDICARE | Admitting: Oncology

## 2021-06-13 ENCOUNTER — Other Ambulatory Visit: Payer: Self-pay

## 2021-06-13 VITALS — BP 107/80 | HR 128 | Temp 97.7°F | Resp 20

## 2021-06-13 DIAGNOSIS — Z90710 Acquired absence of both cervix and uterus: Secondary | ICD-10-CM | POA: Diagnosis not present

## 2021-06-13 DIAGNOSIS — Z7189 Other specified counseling: Secondary | ICD-10-CM | POA: Diagnosis not present

## 2021-06-13 DIAGNOSIS — Z803 Family history of malignant neoplasm of breast: Secondary | ICD-10-CM | POA: Diagnosis not present

## 2021-06-13 DIAGNOSIS — Z95828 Presence of other vascular implants and grafts: Secondary | ICD-10-CM

## 2021-06-13 DIAGNOSIS — Z7901 Long term (current) use of anticoagulants: Secondary | ICD-10-CM | POA: Insufficient documentation

## 2021-06-13 DIAGNOSIS — C50919 Malignant neoplasm of unspecified site of unspecified female breast: Secondary | ICD-10-CM | POA: Diagnosis not present

## 2021-06-13 DIAGNOSIS — E1165 Type 2 diabetes mellitus with hyperglycemia: Secondary | ICD-10-CM | POA: Insufficient documentation

## 2021-06-13 DIAGNOSIS — K760 Fatty (change of) liver, not elsewhere classified: Secondary | ICD-10-CM | POA: Diagnosis not present

## 2021-06-13 DIAGNOSIS — T451X5A Adverse effect of antineoplastic and immunosuppressive drugs, initial encounter: Secondary | ICD-10-CM | POA: Diagnosis not present

## 2021-06-13 DIAGNOSIS — Z86711 Personal history of pulmonary embolism: Secondary | ICD-10-CM | POA: Insufficient documentation

## 2021-06-13 DIAGNOSIS — C7951 Secondary malignant neoplasm of bone: Secondary | ICD-10-CM

## 2021-06-13 DIAGNOSIS — M879 Osteonecrosis, unspecified: Secondary | ICD-10-CM

## 2021-06-13 DIAGNOSIS — Z794 Long term (current) use of insulin: Secondary | ICD-10-CM | POA: Insufficient documentation

## 2021-06-13 DIAGNOSIS — I10 Essential (primary) hypertension: Secondary | ICD-10-CM | POA: Insufficient documentation

## 2021-06-13 DIAGNOSIS — C7800 Secondary malignant neoplasm of unspecified lung: Secondary | ICD-10-CM

## 2021-06-13 DIAGNOSIS — Z79811 Long term (current) use of aromatase inhibitors: Secondary | ICD-10-CM | POA: Diagnosis not present

## 2021-06-13 DIAGNOSIS — G629 Polyneuropathy, unspecified: Secondary | ICD-10-CM | POA: Diagnosis not present

## 2021-06-13 DIAGNOSIS — G893 Neoplasm related pain (acute) (chronic): Secondary | ICD-10-CM | POA: Diagnosis not present

## 2021-06-13 DIAGNOSIS — Z90722 Acquired absence of ovaries, bilateral: Secondary | ICD-10-CM | POA: Diagnosis not present

## 2021-06-13 DIAGNOSIS — Z791 Long term (current) use of non-steroidal anti-inflammatories (NSAID): Secondary | ICD-10-CM | POA: Diagnosis not present

## 2021-06-13 DIAGNOSIS — E669 Obesity, unspecified: Secondary | ICD-10-CM | POA: Insufficient documentation

## 2021-06-13 DIAGNOSIS — Z9221 Personal history of antineoplastic chemotherapy: Secondary | ICD-10-CM | POA: Insufficient documentation

## 2021-06-13 DIAGNOSIS — G620 Drug-induced polyneuropathy: Secondary | ICD-10-CM

## 2021-06-13 DIAGNOSIS — Z170 Estrogen receptor positive status [ER+]: Secondary | ICD-10-CM | POA: Insufficient documentation

## 2021-06-13 DIAGNOSIS — E785 Hyperlipidemia, unspecified: Secondary | ICD-10-CM | POA: Diagnosis not present

## 2021-06-13 DIAGNOSIS — E119 Type 2 diabetes mellitus without complications: Secondary | ICD-10-CM | POA: Insufficient documentation

## 2021-06-13 DIAGNOSIS — I700 Atherosclerosis of aorta: Secondary | ICD-10-CM | POA: Diagnosis not present

## 2021-06-13 DIAGNOSIS — Z923 Personal history of irradiation: Secondary | ICD-10-CM | POA: Diagnosis not present

## 2021-06-13 LAB — COMPREHENSIVE METABOLIC PANEL
ALT: 7 U/L (ref 0–44)
AST: 22 U/L (ref 15–41)
Albumin: 1.8 g/dL — ABNORMAL LOW (ref 3.5–5.0)
Alkaline Phosphatase: 152 U/L — ABNORMAL HIGH (ref 38–126)
Anion gap: 10 (ref 5–15)
BUN: 12 mg/dL (ref 8–23)
CO2: 32 mmol/L (ref 22–32)
Calcium: 10.6 mg/dL — ABNORMAL HIGH (ref 8.9–10.3)
Chloride: 92 mmol/L — ABNORMAL LOW (ref 98–111)
Creatinine, Ser: 0.63 mg/dL (ref 0.44–1.00)
GFR, Estimated: >60 mL/min (ref >60)
Glucose, Bld: 87 mg/dL (ref 70–99)
Potassium: 4.1 mmol/L (ref 3.5–5.1)
Sodium: 134 mmol/L — ABNORMAL LOW (ref 135–145)
Total Bilirubin: 0.7 mg/dL (ref 0.3–1.2)
Total Protein: 5.7 g/dL — ABNORMAL LOW (ref 6.5–8.1)

## 2021-06-13 LAB — IRON AND TIBC
Iron: 26 ug/dL — ABNORMAL LOW (ref 28–170)
Saturation Ratios: 15 % (ref 10.4–31.8)
TIBC: 178 ug/dL — ABNORMAL LOW (ref 250–450)
UIBC: 152 ug/dL

## 2021-06-13 LAB — CBC WITH DIFFERENTIAL/PLATELET
Abs Immature Granulocytes: 0.14 10*3/uL — ABNORMAL HIGH (ref 0.00–0.07)
Basophils Absolute: 0.1 10*3/uL (ref 0.0–0.1)
Basophils Relative: 1 %
Eosinophils Absolute: 0.1 10*3/uL (ref 0.0–0.5)
Eosinophils Relative: 2 %
HCT: 32.2 % — ABNORMAL LOW (ref 36.0–46.0)
Hemoglobin: 10.2 g/dL — ABNORMAL LOW (ref 12.0–15.0)
Immature Granulocytes: 2 %
Lymphocytes Relative: 6 %
Lymphs Abs: 0.4 10*3/uL — ABNORMAL LOW (ref 0.7–4.0)
MCH: 27.9 pg (ref 26.0–34.0)
MCHC: 31.7 g/dL (ref 30.0–36.0)
MCV: 88.2 fL (ref 80.0–100.0)
Monocytes Absolute: 0.8 10*3/uL (ref 0.1–1.0)
Monocytes Relative: 11 %
Neutro Abs: 5.6 10*3/uL (ref 1.7–7.7)
Neutrophils Relative %: 78 %
Platelets: 376 10*3/uL (ref 150–400)
RBC: 3.65 MIL/uL — ABNORMAL LOW (ref 3.87–5.11)
RDW: 17.2 % — ABNORMAL HIGH (ref 11.5–15.5)
WBC: 7.1 10*3/uL (ref 4.0–10.5)
nRBC: 0 % (ref 0.0–0.2)

## 2021-06-13 LAB — TYPE AND SCREEN
ABO/RH(D): A NEG
Antibody Screen: NEGATIVE

## 2021-06-13 LAB — FERRITIN: Ferritin: 1517 ng/mL — ABNORMAL HIGH (ref 11–307)

## 2021-06-13 MED ORDER — RIVAROXABAN 20 MG PO TABS: 20.0000 mg | ORAL_TABLET | Freq: Every day | ORAL | 3 refills | Status: AC

## 2021-06-13 MED ORDER — PROCHLORPERAZINE MALEATE 10 MG PO TABS: 10.0000 mg | ORAL_TABLET | Freq: Four times a day (QID) | ORAL | 1 refills | Status: DC | PRN

## 2021-06-13 MED ORDER — HEPARIN SOD (PORK) LOCK FLUSH 100 UNIT/ML IV SOLN
500.0000 [IU] | Freq: Once | INTRAVENOUS | Status: AC
  Administered 2021-06-13: 500 [IU]

## 2021-06-13 MED ORDER — SODIUM CHLORIDE 0.9% FLUSH
10.0000 mL | Freq: Once | INTRAVENOUS | Status: AC
  Administered 2021-06-13: 10 mL

## 2021-06-13 NOTE — Progress Notes (Signed)
[place]  Telephone:([phone] Fax:([phone]) [phone]    ID: Mia [name] DOB: [birth year]  MR#: [id number]  MEQ#:[id number]  Patient Care Team: Haydee Salter, MD as PCP - General (Family Medicine) Korianna Washer, Virgie Dad, MD as Consulting Physician (Oncology) [name], [name] PA  CHIEF COMPLAINT: Estrogen receptor positive stage IV breast cancer (s/p right mastectomy)  CURRENT TREATMENT: Supportive care with in-home hospice   INTERVAL HISTORY: Mia [name] returns today for follow-up of her estrogen receptor positive stage IV breast cancer.  She is accompanied by [name]  Her most recent staging study was a CT scan of the chest 01/23/2021 which showed significant increase in her left pleural effusion.  There was an age indeterminant left main pulmonary artery thrombosis.  There was no difference noted in the mixed lytic and sclerotic bone mets.  On 02/10/2021 she underwent placement of a pleural drainage catheter.  There country draining every 7 or 8 days, removing 500 cc at a time of dark brownish fluid  On 03/30/2021 she underwent port insertion.  This is working well.  We had ordered a swallowing study to further evaluate the patient's symptoms.  However the patient tells me after she went off Doxil the swallowing problems came back to normal and she now can eat normally.  Other chronic issues include pain management: She is currently on Percocet once a day with good control overall and no constipation  We are following the CA 27-29 Results for Mia [name], Mia [name]" (MRN [id number]) as of 06/13/2021 09:30  Ref. Range 01/24/2021 10:15 03/10/2021 14:58 04/11/2021 13:38 05/02/2021 10:10 05/23/2021 11:19  CA 27.29 Latest Ref Range: 0.0 - 38.6 U/mL 166.3 (H) 109.9 (H) 194.4 (H) 400.6 (H) 392.4 (H)    REVIEW OF SYSTEMS: Mia [name] tells me she is feeling much better since the chemotherapy stopped.  As stated she is swallowing much better and she  has no nausea or vomiting.  Her taste is good and she has no diarrhea or constipation.  She denies shortness of breath.  She spends most of the day watching TV sitting up in a chair.  She also takes naps.  Her husband is concerned that her blood pressure has dropped some.  Otherwise a detailed review of systems was stable  COVID 19 VACCINATION STATUS: [place] x2, with booster 08/16/2020   HISTORY OF CURRENT ILLNESS: From the original intake note:  We have reviewed the medical records from [place], which is the source of the information below:  Mia [name] was initially diagnosed with Stage IIIA (T2N2M0) invasive ductal carcinoma, estrogen receptor positive and HER2 negative right breast cancer in 2000. She underwent right mastectomy, with 4 positive metastatic lymph nodes. She had chemotherapy with taxol and cytoxan and fulvestrant in the past. She had radiation and completed 5 years of tamoxifen.   She was diagnosed with Stage IV disease 04/18/2005 with metastases to the bone, lung, and additional nodes. She was on capecitabine but was discontinued after rising CA 27-29 and scans.   She started anastrozole and everolimus with Delton See in April 2014. She tolerated this well, but this was discontinued in July 2014 due to abnormal blood tests, hyperlipidemia and hyperglycemia.  She began Abraxane 178m /m2 and Xgeva in August 2014 weekly x3 with 1 week off. She tolerated this treatment well. She discontinued Xgeva January 2016 due to osteonecrosis of the jaw and mouth issues. She continued abraxane alone until her last dose on 11/18/2015 due to neuropathy and disease progression  with restaging studies on 11/23/2015 with a CT chest abdomen and pelvis and one scan showing skull, sternal and femoral lesions.   She was switched to gemcitabine starting 01/20/2016 weekly x3 and 1 week off. This was discontinued on 06/15/2016 due to a port related jugular clot on the right side. She was given  Lovenox for 3 months.  She was switched to Letrozole 2.5 mg and palbociclib 125 mg starting 07/13/2016. She tolerated this well. Restaging studies with CT chest abdomen and pelvis and bone scan on 07/01/2017 showed stable/ improving lesions. CA 27-29 was stable.  During the last few months of follow up in [place], she completed restaging studies with a CT chest abdomen and pelvis at [facility] on 12/17/2017 showing: A 6 mm pulmonary nodule in the left upper lobe laterally. Progression of metastatic disease to the bones at the L4 and L5 vertebral bodies. Sclerotic metastases at T8, T10, an T11, are similar to previous exams. Sclerotic metastases in the sternum stable. Hepatic steatosis. Aortic atherosclerosis.  Most recent CA-40-29 (January 2019) was 58.  Most recent hemoglobin A1c was 7.1 according to the patient  The patient's subsequent history is as detailed below.   PAST MEDICAL HISTORY: Past Medical History:  Diagnosis Date   Breast cancer metastasized to bone [facility])    Diabetes mellitus without complication ([place])    Dyspnea    History of radiation therapy 11/18/2018-12/02/2018   lumbar spine and pelvis   Dr [name]   History of radiation therapy 11/08/2020-11/18/2020   right arm   Dr [name]   Hyperlipidemia    Hypertension    Lymphedema of right arm    Neuropathy associated with cancer ([place])    Obesity (BMI 35.0-39.9 without comorbidity)    PONV (postoperative nausea and vomiting)    Pulmonary embolism ([place]) 12/2020     PAST SURGICAL HISTORY: Past Surgical History:  Procedure Laterality Date   CATARACT EXTRACTION Left    HUMERUS IM NAIL Right 01/11/2021   Procedure: INTRAMEDULLARY (IM) NAIL HUMERAL;  Surgeon: Hiram Gash, MD;  Location: [facility];  Service: Orthopedics;  Laterality: Right;   HYSTERECTOMY ABDOMINAL WITH SALPINGO-OOPHORECTOMY     IR IMAGING GUIDED PORT INSERTION  03/30/2021   IR PERC PLEURAL DRAIN W/INDWELL CATH W/IMG GUIDE  02/10/2021   MODIFIED RADICAL  MASTECTOMY Right      FAMILY HISTORY: Family History  Problem Relation Age of Onset   Diabetes Mother    Breast cancer Mother 37   Cerebral aneurysm Mother    Pulmonary embolism Father    Colon cancer Father 87   Breast cancer Sister 53       noninvasive   Breast cancer Paternal Grandmother 78   The patient reports she had negative genetic testing at [facility] 2014, report not available. --The patient's father died at age 40 due to a PE, and he also had a history of colon cancer diagnosed at age 70. The patient's mother died at age 71 due to diabetes and survived a cerebral aneurysm. The patient's mother also had a history of breast cancer diagnosed at age 66.  The patient has no brothers and 1 sister. The patient's sister was diagnosed with non invasive breast cancer at age 46. There was a paternal grandmother diagnosed with breast cancer at age 57. The patient denies a family history of ovarian cancer.    GYNECOLOGIC HISTORY:  Patient's last menstrual period was 10/01/2006. Menarche: 65 years old Age at first live birth: 65 years  old She is GX P2.  She is status post hysterectomy with bilateral salpingo- oophorectomy 12/23/2006 with benign pathology ([id number]) She never used HRT.    SOCIAL HISTORY:  Mia [name] is disabled due to her breast cancer. She used to be Surveyor, quantity for a cardiology office. Her husband, Mia [name] is in the process of retiring. He is a Electrical engineer for a power company. The patient's daughter, [name], lives in [place] and works as a Teaching laboratory technician. The patient's second daughter, [name] is a stay at home mom and is a special needs teacher, who will soon move to [facility] [place]). The patient plans on attending [employer].     ADVANCED DIRECTIVES: In the absence of any documentation to the contrary, the patient's spouse is their HCPOA.    HEALTH MAINTENANCE: Social History   Tobacco Use   Smoking  status: Never   Smokeless tobacco: Never  Vaping Use   Vaping Use: Never used  Substance Use Topics   Alcohol use: Not Currently   Drug use: Never    Colonoscopy: 2017   PAP:  Bone density: never  Mammogram: 2017   Allergies  Allergen Reactions   Morphine And Related Anaphylaxis    Current Outpatient Medications  Medication Sig Dispense Refill   blood glucose meter kit and supplies KIT Dispense based on patient and insurance preference. Use up to four times daily as directed. 1 each 0   fluticasone (FLONASE) 50 MCG/ACT nasal spray SPRAY 2 SPRAYS INTO EACH NOSTRIL EVERY DAY 48 mL 1   gabapentin (NEURONTIN) 300 MG capsule Take 1 capsule (300 mg total) by mouth 4 (four) times daily. Pt is only taking 2 capsules a day 180 capsule 0   glucose blood ([employer] GLUCOSE METER TEST STRIPS) test strip Use as instructed 100 each 12   glucose blood test strip E11.69 Use as instructed 100 each 12   ibuprofen (ADVIL) 200 MG tablet Take 1-2 tablets (200-400 mg total) by mouth every 8 (eight) hours as needed for moderate pain.     Insulin Admin Supplies MISC Inject 24 Units into the skin daily.     insulin glargine (LANTUS SOLOSTAR) 100 UNIT/ML Solostar Pen INJECT 24 UNITS INTO THE SKIN DAILY 15 mL 1   Insulin Pen Needle 31G X 5 MM MISC UAD for Sq inj for DM qd 100 each PRN   JANUMET 50-1000 MG tablet TAKE 1 TABLET BY MOUTH EVERY DAY WITH BREAKFAST 90 tablet 1   Lancets (FREESTYLE) lancets Use as instructed 100 each 12   oxyCODONE-acetaminophen (PERCOCET) 10-325 MG tablet Take 1 tablet by mouth 2 (two) times daily as needed for pain. 30 tablet 0   pantoprazole (PROTONIX) 40 MG tablet Take 1 tablet (40 mg total) by mouth daily. 90 tablet 3   pioglitazone (ACTOS) 15 MG tablet TAKE 1 TABLET BY MOUTH EVERY DAY 90 tablet 1   rivaroxaban (XARELTO) 20 MG TABS tablet Take 1 tablet (20 mg total) by mouth daily with supper. 30 tablet 3   No current facility-administered medications for this visit.     OBJECTIVE: White woman examined in a wheelchair Vitals:   06/13/21 1113  BP: 107/80  [name]: (!) 128  Resp: 20  Temp: 97.7 F (36.5 C)  SpO2: 99%    Wt Readings from Last 3 Encounters:  05/22/21 190 lb (86.2 kg)  05/12/21 190 lb (86.2 kg)  04/11/21 178 lb 8 oz (81 kg)   There is no height or weight on file to  calculate BMI.   ECOG FS:2 - Symptomatic, <50% confined to bed  Sclerae unicteric, EOMs intact Bilateral temporal wasting Lungs no rales or rhonchi Heart regular rate and rhythm Abd soft, nontender, positive bowel sounds MSK no focal spinal tenderness Neuro: nonfocal, well oriented, appropriate affect Breasts: Deferred   LAB RESULTS:  CMP   No results found for: TOTALPROTELP, ALBUMINELP, A1GS, A2GS, BETS, BETA2SER, GAMS, MSPIKE, SPEI  No results found for: KPAFRELGTCHN, LAMBDASER, KAPLAMBRATIO  Lab Results  Component Value Date   WBC 7.1 06/13/2021   NEUTROABS 5.6 06/13/2021   HGB 10.2 (L) 06/13/2021   HCT 32.2 (L) 06/13/2021   MCV 88.2 06/13/2021   PLT 376 06/13/2021    No results found for: LABCA2  No components found for: [id number]  No results for input(s): INR in the last 168 hours.  No results found for: LABCA2  No results found for: IDP824  No results found for: MPN361  No results found for: WER154  Lab Results  Component Value Date   CA2729 437.9 (H) 06/13/2021    No components found for: HGQUANT  No results found for: CEA1 / No results found for: CEA1   No results found for: AFPTUMOR  No results found for: CHROMOGRNA  No results found for: HGBA, HGBA2QUANT, HGBFQUANT, HGBSQUAN (Hemoglobinopathy evaluation)   No results found for: LDH  Lab Results  Component Value Date   IRON 26 (L) 06/13/2021   TIBC 178 (L) 06/13/2021   IRONPCTSAT 15 06/13/2021   (Iron and TIBC)  Lab Results  Component Value Date   FERRITIN 1,517 (H) 06/13/2021    Urinalysis    Component Value Date/Time   COLORURINE YELLOW 04/21/2021 1304    APPEARANCEUR Cloudy (A) 05/16/2021 1409   LABSPEC 1.012 04/21/2021 1304   PHURINE 5.0 04/21/2021 1304   GLUCOSEU Negative 05/16/2021 1409   HGBUR MODERATE (A) 04/21/2021 1304   BILIRUBINUR Negative 05/16/2021 1409   KETONESUR NEGATIVE 04/21/2021 1304   PROTEINUR Negative 05/16/2021 1409   PROTEINUR 30 (A) 04/21/2021 1304   NITRITE Negative 05/16/2021 1409   NITRITE NEGATIVE 04/21/2021 1304   LEUKOCYTESUR Negative 05/16/2021 1409   LEUKOCYTESUR LARGE (A) 04/21/2021 1304    STUDIES: No results found.   Refuses mammography at this point (12/23/2019)   ELIGIBLE FOR AVAILABLE RESEARCH PROTOCOL: no   ASSESSMENT: 65 y.o. [place], [place] woman with stage IV breast cancer, as follows:  (1) status post right mastectomy in 2000, for a pT2 pN2, stage IIIA invasive ductal carcinoma, estrogen receptor positive, progesterone receptor not tested, HER-2 negative (0) by immunohistochemistry  (a) adjuvant chemotherapy with doxorubicin and cyclophosphamide in dose dense fashion x4 followed by paclitaxel in dose dense fashion x4  (b) adjuvant radiation: 30 doses  (c) antiestrogens: Tamoxifen for 5 years, completed 2005  (2) METASTASTIC DISEASE: 2008, involving bones, lungs, and lymph nodes  (a) CA 27-29 is informative  (b) CT [facility] the chest abdomen and pelvis in [facility] finds stable sclerotic metastases (T8, T10, T11, sternum, L4, L5); 0.6 cm left upper lobe lung nodule stable  (3)  prior anti-estrogen treatments:  (a) fulvestrant--progression  (b) exemestane/everolimus--hyperlipidemia, hyperglycemia  (4) prior chemotherapy treatments:  (a) capecitabine: progression  (b) Abraxane, August 2014 through 11/18/2015: good response but stopped due to neuropathy  (c) gemcitabine 01/20/2016--?; multiple interruptions secondary to infections  (5) radiation therapy:  (a) T spine and Right femur, completed 01/10/2016  (6) letrozole/ palbociclib started 07/13/2016, discontinued  10/26/2020 with evidence of progression  (7) bone treatment:  (a) denosumab/Xgeva--discontinued  January 2016 due to osteonecrosis of the jaw  (8) cancer associated pain: Updated 06/13/2021  (a) oxycodone/acetaminophen 10-325 Q AM  (b) PMP Aware reviewed 06/13/2021  (c) bowel prophylaxis: MiraLAX as needed  (9) restaging studies:1  (a) mostly stable bone lesions with evidence of progression at L3-L5; no epidural tumor by total spinal MRI in November 2019  (b) no evidence of progressive lung, lymph node or liver lesions by CT scans of the chest abdomen and pelvis and bone scan December 2019  (c) CT scans abd/pelvis 03/31/2019 shows stable bone metastases, no extraosseous disease  (d) MRI pelvis 04/09/2019 shows bilateral ilium, left acetabulum and lumbar mets, stable  (e) CT abd/pelvis 09/01/2019 shows stable bone mets, slight increase left effusion  (f) CT of the chest 05/05/2020 again shows minimal increase in the left pleural effusion, possible resorption of bone around the left glenoid lesion, otherwise stable  (g) CT of the chest abdomen and pelvis 10/18/2020 again shows no evidence of visceral disease, but there is apparent progression in the bone disease, and some enlargement of the left pleural effusion.  The CA 27-29 has also been rising.  Accordingly her letrozole and palbociclib are being discontinued  (10) palliative radiation 11/18/2018 through 12/02/2018 Site/dose:   1. Lumbar spine; 35 Gy in 14 fractions of 2.5 Gy                      2. Pelvis; 35 Gy in 14 fractions of 2.5 Gy  (11) started cyclophosphamide, methotrexate and fluorouracil [CMF] 11/10/2020  (a) methotrexate held first dose, during palliative radiation  (B) discontinued after 3 cycles (last dose 12/22/2020)  (C) CT chest 01/23/2021 showed worsening left effusion, cytologically positive  (12) status post left thoracentesis 11/21/2020, with positive cytology  (a) tumor cells are strongly estrogen and progesterone  receptor positive, HER-2 negative  (b) foundation 1 requested on the thoracentesis sample shows a PI K3 CA mutation.  Microsatellite status is stable and the tumor mutation burden is low.  There are mutations in ESR 1, GAT A3, and [place] 6  (C) left pleural drain placed 02/10/2021  (13) status post Right humeral shaft intramedullary nail fixation Right proximal humeral nonunion treatment 01/12/2021 [facility])  (14) alpelisib started January 27, 2021, fulvestrant started 01/26/2021  (a) alpelisib and fulvestrant discontinued June 2022 with HbA1c of 9.7  (15) started Doxil 04/11/2021, repeated every 21 days  (a) echo 03/28/2021 shows an ejection fraction in the 60-65% range  (B) Doxil discontinued after 05/02/2021 (second) dose with difficulty swallowing  (16) was to start carboplatin on 06/13/2021, repeated days 1 and 8 every 21 days--however patient decided against further chemotherapy treatments  (17) hospice referral placed on 06/13/2021   PLAN: At the last visit Mia [name] her husband and I had a frank discussion regarding her situation.  Of course they knew that her cancer is not curable.  I do not think they realize that every time we changed treatment to a fourth fifth and sixth treatment the chances of response drop further  With that in mind and noticing how much better Mia [name] is feeling going off the Doxil, she has made a firm decision that she does not want any further chemotherapy or other life-prolonging treatment.  She wants best supportive care, aggressive treatment of symptoms, and she is agreeable to a hospice referral.  That is in particular let us know that having a hospice nurse come by even if only once a week or every 2 weeks would be  very helpful and comforting to him.  Accordingly we have canceled his further chemotherapy treatments.  We have entered a hospice referral today.  I will see Mia [name] (I offered her a virtual visit but she prefers to come in which I think is  reasonable--and we will review his symptom management and how she is faring under hospice at that time.  Total encounter time 35 minutes.Sarajane Jews C. Rubin Dais, MD 06/15/21 10:02 PM Medical Oncology and Hematology [facility] [place], [place] [address] Tel. [phone]    Fax. [phone]   I, Wilburn Mylar, am acting as scribe for Dr. Virgie Dad. Adeleigh Barletta.  I, Lurline Del MD, have reviewed the above documentation for accuracy and completeness, and I agree with the above.   *Total Encounter Time as defined by the Centers for Medicare and Medicaid Services includes, in addition to the face-to-face time of a patient visit (documented in the note above) non-face-to-face time: obtaining and reviewing outside history, ordering and reviewing medications, tests or procedures, care coordination (communications with other health care professionals or caregivers) and documentation in the medical record.

## 2021-06-14 LAB — CANCER ANTIGEN 27.29: CA 27.29: 437.9 U/mL — ABNORMAL HIGH (ref 0.0–38.6)

## 2021-06-15 ENCOUNTER — Encounter: Payer: Self-pay | Admitting: Oncology

## 2021-06-15 ENCOUNTER — Other Ambulatory Visit: Payer: Self-pay | Admitting: Oncology

## 2021-06-19 ENCOUNTER — Other Ambulatory Visit: Payer: Self-pay | Admitting: Oncology

## 2021-06-19 ENCOUNTER — Other Ambulatory Visit: Payer: Self-pay | Admitting: Family Medicine

## 2021-06-20 ENCOUNTER — Ambulatory Visit: Payer: MEDICARE

## 2021-06-20 ENCOUNTER — Other Ambulatory Visit: Payer: MEDICARE

## 2021-06-27 ENCOUNTER — Other Ambulatory Visit: Payer: Self-pay

## 2021-06-27 ENCOUNTER — Encounter: Payer: Self-pay | Admitting: Oncology

## 2021-06-27 ENCOUNTER — Encounter: Payer: Self-pay | Admitting: Family Medicine

## 2021-06-27 ENCOUNTER — Ambulatory Visit (INDEPENDENT_AMBULATORY_CARE_PROVIDER_SITE_OTHER): Payer: MEDICARE | Admitting: Family Medicine

## 2021-06-27 VITALS — BP 126/72 | HR 123 | Temp 97.8°F | Ht 64.0 in | Wt 190.0 lb

## 2021-06-27 DIAGNOSIS — C50919 Malignant neoplasm of unspecified site of unspecified female breast: Secondary | ICD-10-CM

## 2021-06-27 DIAGNOSIS — Z794 Long term (current) use of insulin: Secondary | ICD-10-CM | POA: Diagnosis not present

## 2021-06-27 DIAGNOSIS — E119 Type 2 diabetes mellitus without complications: Secondary | ICD-10-CM

## 2021-06-27 DIAGNOSIS — Z23 Encounter for immunization: Secondary | ICD-10-CM | POA: Diagnosis not present

## 2021-06-27 DIAGNOSIS — T451X5A Adverse effect of antineoplastic and immunosuppressive drugs, initial encounter: Secondary | ICD-10-CM

## 2021-06-27 DIAGNOSIS — E785 Hyperlipidemia, unspecified: Secondary | ICD-10-CM

## 2021-06-27 DIAGNOSIS — I2699 Other pulmonary embolism without acute cor pulmonale: Secondary | ICD-10-CM | POA: Insufficient documentation

## 2021-06-27 DIAGNOSIS — G620 Drug-induced polyneuropathy: Secondary | ICD-10-CM

## 2021-06-27 DIAGNOSIS — J90 Pleural effusion, not elsewhere classified: Secondary | ICD-10-CM

## 2021-06-27 LAB — POCT GLYCOSYLATED HEMOGLOBIN (HGB A1C): Hemoglobin A1C: 5.5 % (ref 4.0–5.6)

## 2021-06-27 NOTE — Progress Notes (Signed)
[facility] [address] [place] [address] Dept: [phone] Dept Fax: [phone]  Transfer of Care Office Visit  Subjective:    Patient ID: Mia [name], female    DOB: [birth year], 65 y.o..   MRN: [id number]  Chief Complaint  Patient presents with   Establish Care    TOC-   no concerns.  Wants flu shot today.     History of Present Illness:  Patient is in today to establish care. Mia [name] was born in [place], [place]. She lived much of her life in the [place], [place] area. She moved to [place] about 3 years ago to be closer to her daughters. She is married. They have one daughter who lives in [place] and another whose husband is in the [employer] and is currently stationed at [employer].They have 4 granddaughters. 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 She does not use tobacco, alcohol, or drugs.  Mia [name] has a history of Stage IV breast cancer, with metastatic disease to the lungs and multiple bones (skull, multiple levels of the spine, pelvis, and knee caps). She has undergone mastectomy and multiple rounds of various chemotherapies. She has not been responding well to these and recently made a decision with Dr. [name] to stop further therapy. She has been referred to Hospice. Mia [name] and her husband are adjusting to a focus on palliative care. 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 She has a pleural catheter in place and she drains off 500 ml of pleural fluid weekly (though at times extends this out). She notes this typically causes some chest discomfort for about 36 hours afterwards and causes some cough. She has a Port-a-cath in place. The pulmonary issues have led to the need for supplemental oxygen, usually at 2 lpm. Mia [name] also uses a wheelchair, mostly due to a  peripheral neuropathy that was secondary to chemotherapy.  Mia [name] has had Type 2 diabetes for the past 22 years. She is currently managed on Lantus insulin (24 units daily), pioglitazone, and Janumet (sitagliptin/metformin).  Mia [name] has a history of GERD managed with Protonix. This had been causing some dysphagia earlier this summer, but this apparently was being exacerbated by her chemo.  Mia [name] notes her goals are to remain comfortable and to enjoy time with her family and friends as best she can for as long as she can.  Past Medical History: Patient Active Problem List   Diagnosis Date Noted   Pulmonary artery thrombosis ([place]) 06/27/2021   GERD (gastroesophageal reflux disease) 05/22/2021   Port-A-Cath in place 04/11/2021   Iron deficiency anemia 04/11/2021   Pleural effusion 01/05/2021   AKI (acute kidney injury) ([place]) 01/05/2021   Hyponatremia 01/05/2021   Shoulder pain, right 01/05/2021   Drug-induced neutropenia ([place]) 12/04/2018   Hyperlipidemia 06/03/2018   Metastatic breast cancer ([place]) 03/12/2018   Bone metastases ([place]) 03/12/2018   Lung metastases ([place]) 03/12/2018   Osteonecrosis ([place]) 03/12/2018   Peripheral neuropathy due to chemotherapy ([place]) 03/12/2018   Type 2 diabetes mellitus ([place]) 03/12/2018   Hepatic steatosis 03/12/2018   Aortic atherosclerosis ([place]) 03/12/2018   Past Surgical History:  Procedure Laterality Date   CATARACT EXTRACTION Left    HUMERUS IM NAIL Right 01/11/2021   Procedure: INTRAMEDULLARY (IM) NAIL HUMERAL;  Surgeon: Hiram Gash, MD;  Location: [facility];  Service: Orthopedics;  Laterality: Right;   HYSTERECTOMY  ABDOMINAL WITH SALPINGO-OOPHORECTOMY     IR IMAGING GUIDED PORT INSERTION  03/30/2021   IR PERC PLEURAL DRAIN W/INDWELL CATH W/IMG GUIDE  02/10/2021   MODIFIED RADICAL MASTECTOMY Right    Family History  Problem Relation Age of Onset   Diabetes Mother    Breast cancer Mother 49   Cerebral aneurysm Mother     Pulmonary embolism Father    Colon cancer Father 61   Breast cancer Sister 58       noninvasive   Breast cancer Paternal Grandmother 78   Outpatient Medications Prior to Visit  Medication Sig Dispense Refill   blood glucose meter kit and supplies KIT Dispense based on patient and insurance preference. Use up to four times daily as directed. 1 each 0   fluticasone (FLONASE) 50 MCG/ACT nasal spray SPRAY 2 SPRAYS INTO EACH NOSTRIL EVERY DAY 48 mL 1   gabapentin (NEURONTIN) 300 MG capsule Take 1 capsule (300 mg total) by mouth 4 (four) times daily. Pt is only taking 2 capsules a day 180 capsule 0   glucose blood ([employer] GLUCOSE METER TEST STRIPS) test strip Use as instructed 100 each 12   glucose blood test strip E11.69 Use as instructed 100 each 12   ibuprofen (ADVIL) 200 MG tablet Take 1-2 tablets (200-400 mg total) by mouth every 8 (eight) hours as needed for moderate pain.     Insulin Admin Supplies MISC Inject 24 Units into the skin daily.     insulin glargine (LANTUS SOLOSTAR) 100 UNIT/ML Solostar Pen INJECT 24 UNITS INTO THE SKIN DAILY 15 mL 1   Insulin Pen Needle (B-D UF III MINI PEN NEEDLES) 31G X 5 MM MISC USE AS DIRECTED DAILY E11.9 100 each PRN   JANUMET 50-1000 MG tablet TAKE 1 TABLET BY MOUTH EVERY DAY WITH BREAKFAST 90 tablet 1   Lancets (FREESTYLE) lancets Use as instructed 100 each 12   ONETOUCH ULTRA test strip DISPENSE BASED ON PATIENT AND INSURANCE PREFERENCE. USE UP TO FOUR TIMES DAILY AS DIRECTED. 100 strip 3   oxyCODONE-acetaminophen (PERCOCET) 10-325 MG tablet Take 1 tablet by mouth 2 (two) times daily as needed for pain. 30 tablet 0   pantoprazole (PROTONIX) 40 MG tablet Take 1 tablet (40 mg total) by mouth daily. 90 tablet 3   pioglitazone (ACTOS) 15 MG tablet TAKE 1 TABLET BY MOUTH EVERY DAY 90 tablet 1   rivaroxaban (XARELTO) 20 MG TABS tablet Take 1 tablet (20 mg total) by mouth daily with supper. 30 tablet 3   No facility-administered medications prior to visit.    Allergies  Allergen Reactions   Morphine And Related Anaphylaxis    Objective:   Today's Vitals   06/27/21 1007  BP: 126/72  Pulse: (!) 123  Temp: 97.8 F (36.6 C)  TempSrc: Temporal  SpO2: 97%  Weight: 190 lb (86.2 kg)  Height: 5' 4" (1.626 m)   Body mass index is 32.61 kg/m.   General: Well developed, well nourished. No acute distress. Lungs: No air movement noted int he left chest. The right lung is clear to auscultation without wheezing,   rales or rhonchi. Extremities: Mild edema of the right arm. Feet: Skin intact. Mild scaliness to the distal great toes bilaterally. Psych: Alert and oriented. Normal mood and affect.  Health Maintenance Due  Topic Date Due   Zoster Vaccines- Shingrix (1 of 2) Never done   MAMMOGRAM  06/03/2018   FOOT EXAM  06/04/2019   COVID-19 Vaccine (4 - Booster for Pfizer series)   11/08/2020   OPHTHALMOLOGY EXAM  12/02/2020   URINE MICROALBUMIN  02/16/2021     Imaging: CT of Chest with contrast (01/23/2021) IMPRESSION: 1. Significant interval increase in volume of left pleural effusion with near complete atelectasis of the left lung. 2. No significant interval change in diffuse mixed lytic and sclerotic bone metastases. 3. New filling defect is identified left main pulmonary artery consistent with age indeterminate pulmonary embolus. No additional pulmonary emboli identified. 4. Coronary artery calcifications noted. 5. Aortic atherosclerosis.  Lab results: Lab Results  Component Value Date   HGBA1C 5.5 06/27/2021   Lab Results  Component Value Date   WBC 7.1 06/13/2021   HGB 10.2 (L) 06/13/2021   HCT 32.2 (L) 06/13/2021   MCV 88.2 06/13/2021   PLT 376 06/13/2021   CMP Latest Ref Rng & Units 06/13/2021 05/30/2021 05/23/2021  Glucose 70 - 99 mg/dL 87 126(H) 107(H)  BUN 8 - 23 mg/dL _0 Creatinine 0.44 - 1.00 mg/dL 0.63 0.62 0.71  Sodium 135 - 145 mmol/L 134(L) 132(L) 134(L)  Potassium 3.5 - 5.1 mmol/L 4.1 3.8 4.2  Chloride  98 - 111 mmol/L 92(L) 93(L) 94(L)  CO2 22 - 32 mmol/L 32 29 29  Calcium 8.9 - 10.3 mg/dL 10.6(H) 9.4 9.2  Total Protein 6.5 - 8.1 g/dL 5.7(L) 5.9(L) 6.3(L)  Total Bilirubin 0.3 - 1.2 mg/dL 0.7 0.6 0.4  Alkaline Phos 38 - 126 U/L 152(H) 133(H) 121  AST 15 - 41 U/L 22 32 20  ALT 0 - 44 U/L _1 Assessment & Plan:   1. Type 2 diabetes mellitus without complication, with long-term current use of insulin (HCC) We will check her HbA1c today. I do feel maintaining some degree of blood sugar control will decrease risk for developing symptoms that may impact her quality of life. We will plan to continue her on her current diabetes meds.  - POCT HgB A1C  2. Hyperlipidemia, unspecified hyperlipidemia type Her atorvastatin was stopped in the past month. I agree that this is likely not needed in light of shift to end-of-life care.  3. Metastatic breast cancer [facility]) Reviewed multiple prior oncology consult notes. Ms. Monte has been referred to Hospice. We will continue to co-manage her with the Hospice team.  4. Pleural effusion Pleural drain catheter was placed in May. I agree with continuing to remove the pleural fluid as previously recommended.   5. Pulmonary artery thrombosis (HCC) Continue on rivaroxaban.  6. Peripheral neuropathy due to chemotherapy (HCC) Stable. Continue use of wheelchair.  7. Need for influenza vaccination  - Flu Vaccine QUAD 6+ mos PF IM (Fluarix Quad PF)  Haydee Salter, MD

## 2021-06-28 ENCOUNTER — Other Ambulatory Visit: Payer: Self-pay | Admitting: *Deleted

## 2021-06-28 ENCOUNTER — Encounter: Payer: Self-pay | Admitting: Oncology

## 2021-06-28 DIAGNOSIS — C50919 Malignant neoplasm of unspecified site of unspecified female breast: Secondary | ICD-10-CM

## 2021-06-28 NOTE — Progress Notes (Signed)
Per MD request RN successfully placed referral for hospice.  RN placed call intake to [facility].

## 2021-06-30 DIAGNOSIS — Z9221 Personal history of antineoplastic chemotherapy: Secondary | ICD-10-CM | POA: Diagnosis not present

## 2021-06-30 DIAGNOSIS — K219 Gastro-esophageal reflux disease without esophagitis: Secondary | ICD-10-CM | POA: Diagnosis not present

## 2021-06-30 DIAGNOSIS — C7800 Secondary malignant neoplasm of unspecified lung: Secondary | ICD-10-CM | POA: Diagnosis not present

## 2021-06-30 DIAGNOSIS — E1142 Type 2 diabetes mellitus with diabetic polyneuropathy: Secondary | ICD-10-CM | POA: Diagnosis not present

## 2021-06-30 DIAGNOSIS — C50919 Malignant neoplasm of unspecified site of unspecified female breast: Secondary | ICD-10-CM | POA: Diagnosis not present

## 2021-06-30 DIAGNOSIS — C7951 Secondary malignant neoplasm of bone: Secondary | ICD-10-CM | POA: Diagnosis not present

## 2021-06-30 DIAGNOSIS — Z86711 Personal history of pulmonary embolism: Secondary | ICD-10-CM | POA: Diagnosis not present

## 2021-06-30 DIAGNOSIS — Z9981 Dependence on supplemental oxygen: Secondary | ICD-10-CM | POA: Diagnosis not present

## 2021-06-30 DIAGNOSIS — Z803 Family history of malignant neoplasm of breast: Secondary | ICD-10-CM | POA: Diagnosis not present

## 2021-06-30 DIAGNOSIS — Z170 Estrogen receptor positive status [ER+]: Secondary | ICD-10-CM | POA: Diagnosis not present

## 2021-06-30 DIAGNOSIS — Z9011 Acquired absence of right breast and nipple: Secondary | ICD-10-CM | POA: Diagnosis not present

## 2021-06-30 DIAGNOSIS — I10 Essential (primary) hypertension: Secondary | ICD-10-CM | POA: Diagnosis not present

## 2021-07-01 DIAGNOSIS — Z9221 Personal history of antineoplastic chemotherapy: Secondary | ICD-10-CM | POA: Diagnosis not present

## 2021-07-01 DIAGNOSIS — C50919 Malignant neoplasm of unspecified site of unspecified female breast: Secondary | ICD-10-CM | POA: Diagnosis not present

## 2021-07-01 DIAGNOSIS — Z9981 Dependence on supplemental oxygen: Secondary | ICD-10-CM | POA: Diagnosis not present

## 2021-07-01 DIAGNOSIS — C7951 Secondary malignant neoplasm of bone: Secondary | ICD-10-CM | POA: Diagnosis not present

## 2021-07-01 DIAGNOSIS — Z170 Estrogen receptor positive status [ER+]: Secondary | ICD-10-CM | POA: Diagnosis not present

## 2021-07-01 DIAGNOSIS — I10 Essential (primary) hypertension: Secondary | ICD-10-CM | POA: Diagnosis not present

## 2021-07-01 DIAGNOSIS — C7800 Secondary malignant neoplasm of unspecified lung: Secondary | ICD-10-CM | POA: Diagnosis not present

## 2021-07-01 DIAGNOSIS — Z9011 Acquired absence of right breast and nipple: Secondary | ICD-10-CM | POA: Diagnosis not present

## 2021-07-01 DIAGNOSIS — E1142 Type 2 diabetes mellitus with diabetic polyneuropathy: Secondary | ICD-10-CM | POA: Diagnosis not present

## 2021-07-01 DIAGNOSIS — Z803 Family history of malignant neoplasm of breast: Secondary | ICD-10-CM | POA: Diagnosis not present

## 2021-07-01 DIAGNOSIS — K219 Gastro-esophageal reflux disease without esophagitis: Secondary | ICD-10-CM | POA: Diagnosis not present

## 2021-07-01 DIAGNOSIS — Z86711 Personal history of pulmonary embolism: Secondary | ICD-10-CM | POA: Diagnosis not present

## 2021-07-03 DIAGNOSIS — C50919 Malignant neoplasm of unspecified site of unspecified female breast: Secondary | ICD-10-CM | POA: Diagnosis not present

## 2021-07-03 DIAGNOSIS — E1142 Type 2 diabetes mellitus with diabetic polyneuropathy: Secondary | ICD-10-CM | POA: Diagnosis not present

## 2021-07-03 DIAGNOSIS — Z86711 Personal history of pulmonary embolism: Secondary | ICD-10-CM | POA: Diagnosis not present

## 2021-07-03 DIAGNOSIS — C7951 Secondary malignant neoplasm of bone: Secondary | ICD-10-CM | POA: Diagnosis not present

## 2021-07-03 DIAGNOSIS — C7800 Secondary malignant neoplasm of unspecified lung: Secondary | ICD-10-CM | POA: Diagnosis not present

## 2021-07-03 DIAGNOSIS — I10 Essential (primary) hypertension: Secondary | ICD-10-CM | POA: Diagnosis not present

## 2021-07-04 ENCOUNTER — Other Ambulatory Visit: Payer: MEDICARE

## 2021-07-04 ENCOUNTER — Encounter: Payer: Self-pay | Admitting: Oncology

## 2021-07-04 ENCOUNTER — Ambulatory Visit: Payer: MEDICARE

## 2021-07-04 ENCOUNTER — Ambulatory Visit: Payer: MEDICARE | Admitting: Oncology

## 2021-07-05 DIAGNOSIS — I10 Essential (primary) hypertension: Secondary | ICD-10-CM | POA: Diagnosis not present

## 2021-07-05 DIAGNOSIS — Z86711 Personal history of pulmonary embolism: Secondary | ICD-10-CM | POA: Diagnosis not present

## 2021-07-05 DIAGNOSIS — C50919 Malignant neoplasm of unspecified site of unspecified female breast: Secondary | ICD-10-CM | POA: Diagnosis not present

## 2021-07-05 DIAGNOSIS — C7800 Secondary malignant neoplasm of unspecified lung: Secondary | ICD-10-CM | POA: Diagnosis not present

## 2021-07-05 DIAGNOSIS — E1142 Type 2 diabetes mellitus with diabetic polyneuropathy: Secondary | ICD-10-CM | POA: Diagnosis not present

## 2021-07-05 DIAGNOSIS — C7951 Secondary malignant neoplasm of bone: Secondary | ICD-10-CM | POA: Diagnosis not present

## 2021-07-06 ENCOUNTER — Encounter: Payer: Self-pay | Admitting: Oncology

## 2021-07-11 ENCOUNTER — Ambulatory Visit: Payer: MEDICARE

## 2021-07-11 ENCOUNTER — Other Ambulatory Visit: Payer: MEDICARE

## 2021-07-12 ENCOUNTER — Other Ambulatory Visit: Payer: Self-pay

## 2021-07-12 ENCOUNTER — Ambulatory Visit (HOSPITAL_COMMUNITY)
Admission: RE | Admit: 2021-07-12 | Discharge: 2021-07-12 | Disposition: A | Discharge status: Home / Self Care | Source: Ambulatory Visit | Attending: Oncology | Admitting: Oncology

## 2021-07-12 ENCOUNTER — Inpatient Hospital Stay (HOSPITAL_BASED_OUTPATIENT_CLINIC_OR_DEPARTMENT_OTHER): Payer: MEDICARE | Admitting: Oncology

## 2021-07-12 ENCOUNTER — Inpatient Hospital Stay: Payer: MEDICARE | Attending: Oncology

## 2021-07-12 VITALS — BP 141/69 | HR 129 | Temp 97.5°F | Resp 18 | Ht 64.0 in

## 2021-07-12 DIAGNOSIS — Z853 Personal history of malignant neoplasm of breast: Secondary | ICD-10-CM | POA: Diagnosis not present

## 2021-07-12 DIAGNOSIS — C7802 Secondary malignant neoplasm of left lung: Secondary | ICD-10-CM

## 2021-07-12 DIAGNOSIS — C7800 Secondary malignant neoplasm of unspecified lung: Secondary | ICD-10-CM

## 2021-07-12 DIAGNOSIS — C7951 Secondary malignant neoplasm of bone: Secondary | ICD-10-CM | POA: Diagnosis not present

## 2021-07-12 DIAGNOSIS — M879 Osteonecrosis, unspecified: Secondary | ICD-10-CM

## 2021-07-12 DIAGNOSIS — C7801 Secondary malignant neoplasm of right lung: Secondary | ICD-10-CM

## 2021-07-12 DIAGNOSIS — C50919 Malignant neoplasm of unspecified site of unspecified female breast: Secondary | ICD-10-CM

## 2021-07-12 DIAGNOSIS — Z95828 Presence of other vascular implants and grafts: Secondary | ICD-10-CM

## 2021-07-12 DIAGNOSIS — E119 Type 2 diabetes mellitus without complications: Secondary | ICD-10-CM | POA: Diagnosis not present

## 2021-07-12 LAB — CMP (CANCER CENTER ONLY)
ALT: 7 U/L (ref 0–44)
AST: 17 U/L (ref 15–41)
Albumin: 2.1 g/dL — ABNORMAL LOW (ref 3.5–5.0)
Alkaline Phosphatase: 175 U/L — ABNORMAL HIGH (ref 38–126)
Anion gap: 9 (ref 5–15)
BUN: 11 mg/dL (ref 8–23)
CO2: 32 mmol/L (ref 22–32)
Calcium: 10 mg/dL (ref 8.9–10.3)
Chloride: 97 mmol/L — ABNORMAL LOW (ref 98–111)
Creatinine: 0.58 mg/dL (ref 0.44–1.00)
GFR, Estimated: >60 mL/min (ref >60)
Glucose, Bld: 93 mg/dL (ref 70–99)
Potassium: 4.2 mmol/L (ref 3.5–5.1)
Sodium: 138 mmol/L (ref 135–145)
Total Bilirubin: 0.4 mg/dL (ref 0.3–1.2)
Total Protein: 6.1 g/dL — ABNORMAL LOW (ref 6.5–8.1)

## 2021-07-12 LAB — CBC WITH DIFFERENTIAL/PLATELET
Abs Immature Granulocytes: 0.23 10*3/uL — ABNORMAL HIGH (ref 0.00–0.07)
Basophils Absolute: 0 10*3/uL (ref 0.0–0.1)
Basophils Relative: 0 %
Eosinophils Absolute: 0.1 10*3/uL (ref 0.0–0.5)
Eosinophils Relative: 1 %
HCT: 23.8 % — ABNORMAL LOW (ref 36.0–46.0)
Hemoglobin: 7.2 g/dL — ABNORMAL LOW (ref 12.0–15.0)
Immature Granulocytes: 2 %
Lymphocytes Relative: 6 %
Lymphs Abs: 0.6 10*3/uL — ABNORMAL LOW (ref 0.7–4.0)
MCH: 27.1 pg (ref 26.0–34.0)
MCHC: 30.3 g/dL (ref 30.0–36.0)
MCV: 89.5 fL (ref 80.0–100.0)
Monocytes Absolute: 0.8 10*3/uL (ref 0.1–1.0)
Monocytes Relative: 8 %
Neutro Abs: 8.2 10*3/uL — ABNORMAL HIGH (ref 1.7–7.7)
Neutrophils Relative %: 83 %
Platelets: 590 10*3/uL — ABNORMAL HIGH (ref 150–400)
RBC: 2.66 MIL/uL — ABNORMAL LOW (ref 3.87–5.11)
RDW: 18 % — ABNORMAL HIGH (ref 11.5–15.5)
WBC: 10 10*3/uL (ref 4.0–10.5)
nRBC: 0 % (ref 0.0–0.2)

## 2021-07-12 MED ORDER — SODIUM CHLORIDE 0.9% FLUSH
10.0000 mL | INTRAVENOUS | Status: AC | PRN
  Administered 2021-07-12: 10 mL

## 2021-07-12 MED ORDER — HEPARIN SOD (PORK) LOCK FLUSH 100 UNIT/ML IV SOLN
500.0000 [IU] | INTRAVENOUS | Status: AC | PRN
Start: 2021-07-12 — End: 2021-07-12
  Administered 2021-07-12: 500 [IU]

## 2021-07-12 NOTE — Progress Notes (Signed)
Mia [name]  [phone] [phone]) [phone]    ID: Mia [name] DOB: [birth year]  MR#: [id number]  DJS#:[id number]  Patient Care Team: Mia Salter, MD as PCP - General (Family Medicine) Mia [name], Mia Dad, MD as Consulting Physician (Oncology) OTHER MD: Mia Austria MD, Mia Sabal PA  CHIEF COMPLAINT: Estrogen receptor positive stage IV breast cancer (s/p right mastectomy)  CURRENT TREATMENT: Supportive care with in-home hospice   INTERVAL HISTORY: Mia [name] returns today for follow-up of her estrogen receptor positive stage IV breast cancer.  She is accompanied by [name]  At the last visit we decided to stop active treatment and proceed to best supportive care the result of that is that she is feeling much better.  She has a little bit more energy.  Her sense of taste has improved, so has her appetite and she is eating better meals.  She tells me that hospice nurse comes about once a week and is due for her second visit tomorrow  On 02/10/2021 Mia [name] underwent placement of a pleural drainage catheter.  Since that time they have done drainage about once a week removing 500 cc at a time.  However a week ago they could only draw 450 cc and then this week when they tried to drain they got what looks like clots and no fluid at all.  They are very concerned that this may lead to significant problems breathing but right now there has been no change in Mia [name]'s breathing.  She is not more short of breath although she does continue to cough and she has no pleurisy.  Other chronic issues include pain management: Thankfully at this point she is pain-free on no pain medication except an occasional Tylenol if she develops a headache.   REVIEW OF SYSTEMS: A detailed review of systems today was otherwise stable except as noted above   COVID 19 VACCINATION STATUS: [place] x2, with booster 08/16/2020   HISTORY OF CURRENT ILLNESS: From the original intake  note:  We have reviewed the medical records from [place], which is the source of the information below:  Mia [name] was initially diagnosed with Stage IIIA (T2N2M0) invasive ductal carcinoma, estrogen receptor positive and HER2 negative right breast cancer in 2000. She underwent right mastectomy, with 4 positive metastatic lymph nodes. She had chemotherapy with taxol and cytoxan and fulvestrant in the past. She had radiation and completed 5 years of tamoxifen.   She was diagnosed with Stage IV disease 04/18/2005 with metastases to the bone, lung, and additional nodes. She was on capecitabine but was discontinued after rising CA 27-29 and scans.   She started anastrozole and everolimus with [place] See in April 2014. She tolerated this well, but this was discontinued in July 2014 due to abnormal blood tests, hyperlipidemia and hyperglycemia.  She began Abraxane 110m /m2 and Xgeva in August 2014 weekly x3 with 1 week off. She tolerated this treatment well. She discontinued Xgeva January 2016 due to osteonecrosis of the jaw and mouth issues. She continued abraxane alone until her last dose on 11/18/2015 due to neuropathy and disease progression with restaging studies on 11/23/2015 with a CT chest abdomen and pelvis and one scan showing skull, sternal and femoral lesions.   She was switched to gemcitabine starting 01/20/2016 weekly x3 and 1 week off. This was discontinued on 06/15/2016 due to a port related jugular clot on the right side. She was given Lovenox for 3 months.  She was switched to Letrozole 2.5 mg and palbociclib  125 mg starting 07/13/2016. She tolerated this well. Restaging studies with CT chest abdomen and pelvis and bone scan on 07/01/2017 showed stable/ improving lesions. CA 27-29 was stable.  During the last few months of follow up in [place], she completed restaging studies with a CT chest abdomen and pelvis at [facility] on 12/17/2017 showing: A 6 mm pulmonary nodule in  the left upper lobe laterally. Progression of metastatic disease to the bones at the L4 and L5 vertebral bodies. Sclerotic metastases at T8, T10, an T11, are similar to previous exams. Sclerotic metastases in the sternum stable. Hepatic steatosis. Aortic atherosclerosis.  Most recent CA-60-29 (January 2019) was 58.  Most recent hemoglobin A1c was 7.1 according to the patient  The patient's subsequent history is as detailed below.   PAST MEDICAL HISTORY: Past Medical History:  Diagnosis Date   Breast cancer metastasized to bone [facility])    Diabetes mellitus without complication ([place])    Dyspnea    History of radiation therapy 11/18/2018-12/02/2018   lumbar spine and pelvis   Dr Mia [name]   History of radiation therapy 11/08/2020-11/18/2020   right arm   Dr Mia [name]   Hyperlipidemia    Hypertension    Lymphedema of right arm    Neuropathy associated with cancer ([place])    Obesity (BMI 35.0-39.9 without comorbidity)    PONV (postoperative nausea and vomiting)    Pulmonary embolism ([place]) 12/2020     PAST SURGICAL HISTORY: Past Surgical History:  Procedure Laterality Date   CATARACT EXTRACTION Left    HUMERUS IM NAIL Right 01/11/2021   Procedure: INTRAMEDULLARY (IM) NAIL HUMERAL;  Surgeon: Hiram Gash, MD;  Location: [facility];  Service: Orthopedics;  Laterality: Right;   HYSTERECTOMY ABDOMINAL WITH SALPINGO-OOPHORECTOMY     IR IMAGING GUIDED PORT INSERTION  03/30/2021   IR PERC PLEURAL DRAIN W/INDWELL CATH W/IMG GUIDE  02/10/2021   MODIFIED RADICAL MASTECTOMY Right      FAMILY HISTORY: Family History  Problem Relation Age of Onset   Diabetes Mother    Breast cancer Mother 60   Cerebral aneurysm Mother    Pulmonary embolism Father    Colon cancer Father 46   Breast cancer Sister 87       noninvasive   Breast cancer Paternal Grandmother 59   The patient reports she had negative genetic testing at [facility] 2014, report not available. --The patient's father died at age  81 due to a PE, and he also had a history of colon cancer diagnosed at age 18. The patient's mother died at age 55 due to diabetes and survived a cerebral aneurysm. The patient's mother also had a history of breast cancer diagnosed at age 11.  The patient has no brothers and 1 sister. The patient's sister was diagnosed with non invasive breast cancer at age 51. There was a paternal grandmother diagnosed with breast cancer at age 64. The patient denies a family history of ovarian cancer.    GYNECOLOGIC HISTORY:  Patient's last menstrual period was 10/01/2006. Menarche: 65 years old Age at first live birth: 65 years old She is [place] P2.  She is status post hysterectomy with bilateral salpingo- oophorectomy 12/23/2006 with benign pathology ([id number]) She never used HRT.    SOCIAL HISTORY:  Mia [name] is disabled due to her breast cancer. She used to be Surveyor, quantity for a cardiology office. Her husband, Mia [name] is in the process of retiring. He is a Electrical engineer for a power company. The patient's daughter,  Ross Stores, lives in [place] and works as a Teaching laboratory technician. The patient's second daughter, [name] is a stay at home mom and is a special needs teacher, who will soon move to [facility] [place]). The patient plans on attending [employer].     ADVANCED DIRECTIVES: In the absence of any documentation to the contrary, the patient's spouse is their HCPOA.    HEALTH MAINTENANCE: Social History   Tobacco Use   Smoking status: Never   Smokeless tobacco: Never  Vaping Use   Vaping Use: Never used  Substance Use Topics   Alcohol use: Not Currently   Drug use: Never    Colonoscopy: 2017   PAP:  Bone density: never  Mammogram: 2017   Allergies  Allergen Reactions   Morphine And Related Anaphylaxis    Current Outpatient Medications  Medication Sig Dispense Refill   blood glucose meter kit and supplies KIT Dispense based on patient and insurance  preference. Use up to four times daily as directed. 1 each 0   fluticasone (FLONASE) 50 MCG/ACT nasal spray SPRAY 2 SPRAYS INTO EACH NOSTRIL EVERY DAY 48 mL 1   gabapentin (NEURONTIN) 300 MG capsule Take 1 capsule (300 mg total) by mouth 4 (four) times daily. Pt is only taking 2 capsules a day 180 capsule 0   glucose blood ([employer] GLUCOSE METER TEST STRIPS) test strip Use as instructed 100 each 12   glucose blood test strip E11.69 Use as instructed 100 each 12   ibuprofen (ADVIL) 200 MG tablet Take 1-2 tablets (200-400 mg total) by mouth every 8 (eight) hours as needed for moderate pain.     Insulin Admin Supplies MISC Inject 24 Units into the skin daily.     insulin glargine (LANTUS SOLOSTAR) 100 UNIT/ML Solostar Pen INJECT 24 UNITS INTO THE SKIN DAILY 15 mL 1   Insulin Pen Needle (B-D UF III MINI PEN NEEDLES) 31G X 5 MM MISC USE AS DIRECTED DAILY E11.9 100 each PRN   JANUMET 50-1000 MG tablet TAKE 1 TABLET BY MOUTH EVERY DAY WITH BREAKFAST 90 tablet 1   Lancets (FREESTYLE) lancets Use as instructed 100 each 12   ONETOUCH ULTRA test strip DISPENSE BASED ON PATIENT AND INSURANCE PREFERENCE. USE UP TO FOUR TIMES DAILY AS DIRECTED. 100 strip 3   oxyCODONE-acetaminophen (PERCOCET) 10-325 MG tablet Take 1 tablet by mouth 2 (two) times daily as needed for pain. 30 tablet 0   pantoprazole (PROTONIX) 40 MG tablet Take 1 tablet (40 mg total) by mouth daily. 90 tablet 3   pioglitazone (ACTOS) 15 MG tablet TAKE 1 TABLET BY MOUTH EVERY DAY 90 tablet 1   rivaroxaban (XARELTO) 20 MG TABS tablet Take 1 tablet (20 mg total) by mouth daily with supper. 30 tablet 3   No current facility-administered medications for this visit.    OBJECTIVE: White woman examined in a wheelchair Vitals:   07/12/21 1554  BP: (!) 141/69  [name]: (!) 129  Resp: 18  Temp: (!) 97.5 F (36.4 C)  SpO2: 100%    Wt Readings from Last 3 Encounters:  06/27/21 190 lb (86.2 kg)  05/22/21 190 lb (86.2 kg)  05/12/21 190 lb (86.2 kg)    Body mass index is 32.61 kg/m.   ECOG FS:2 - Symptomatic, <50% confined to bed  Sclerae unicteric, EOMs intact Bilateral temporal wasting, mild Lungs no rales or rhonchi Heart regular rate and rhythm Abd soft, nontender, positive bowel sounds MSK no focal spinal tenderness Neuro: nonfocal, well oriented,  appropriate affect Breasts: Deferred   LAB RESULTS:  CMP   No results found for: TOTALPROTELP, ALBUMINELP, A1GS, A2GS, BETS, BETA2SER, GAMS, MSPIKE, SPEI  No results found for: KPAFRELGTCHN, LAMBDASER, KAPLAMBRATIO  Lab Results  Component Value Date   WBC 10.0 07/12/2021   NEUTROABS 8.2 (H) 07/12/2021   HGB 7.2 (L) 07/12/2021   HCT 23.8 (L) 07/12/2021   MCV 89.5 07/12/2021   PLT 590 (H) 07/12/2021    No results found for: LABCA2  No components found for: EGBTDV761  No results for input(s): INR in the last 168 hours.  No results found for: LABCA2  No results found for: YWV371  No results found for: GGY694  No results found for: WNI627  Lab Results  Component Value Date   CA2729 437.9 (H) 06/13/2021    No components found for: HGQUANT  No results found for: CEA1 / No results found for: CEA1   No results found for: AFPTUMOR  No results found for: CHROMOGRNA  No results found for: HGBA, HGBA2QUANT, HGBFQUANT, HGBSQUAN (Hemoglobinopathy evaluation)   No results found for: LDH  Lab Results  Component Value Date   IRON 26 (L) 06/13/2021   TIBC 178 (L) 06/13/2021   IRONPCTSAT 15 06/13/2021   (Iron and TIBC)  Lab Results  Component Value Date   FERRITIN 1,517 (H) 06/13/2021    Urinalysis    Component Value Date/Time   COLORURINE YELLOW 04/21/2021 1304   APPEARANCEUR Cloudy (A) 05/16/2021 1409   LABSPEC 1.012 04/21/2021 1304   PHURINE 5.0 04/21/2021 1304   GLUCOSEU Negative 05/16/2021 1409   HGBUR MODERATE (A) 04/21/2021 1304   BILIRUBINUR Negative 05/16/2021 1409   KETONESUR NEGATIVE 04/21/2021 1304   PROTEINUR Negative 05/16/2021  1409   PROTEINUR 30 (A) 04/21/2021 1304   NITRITE Negative 05/16/2021 1409   NITRITE NEGATIVE 04/21/2021 1304   LEUKOCYTESUR Negative 05/16/2021 1409   LEUKOCYTESUR LARGE (A) 04/21/2021 1304    STUDIES: No results found.  ELIGIBLE FOR AVAILABLE RESEARCH PROTOCOL: no   ASSESSMENT: 65 y.o. [place], [place] woman with stage IV breast cancer, as follows:  (1) status post right mastectomy in 2000, for a pT2 pN2, stage IIIA invasive ductal carcinoma, estrogen receptor positive, progesterone receptor not tested, HER-2 negative (0) by immunohistochemistry  (a) adjuvant chemotherapy with doxorubicin and cyclophosphamide in dose dense fashion x4 followed by paclitaxel in dose dense fashion x4  (b) adjuvant radiation: 30 doses  (c) antiestrogens: Tamoxifen for 5 years, completed 2005  (2) METASTASTIC DISEASE: 2008, involving bones, lungs, and lymph nodes  (a) CA 27-29 is informative  (b) CT of the chest abdomen and pelvis in [facility] finds stable sclerotic metastases (T8, T10, T11, sternum, L4, L5); 0.6 cm left upper lobe lung nodule stable  (3)  prior anti-estrogen treatments:  (a) fulvestrant--progression  (b) exemestane/everolimus--hyperlipidemia, hyperglycemia  (4) prior chemotherapy treatments:  (a) capecitabine: progression  (b) Abraxane, August 2014 through 11/18/2015: good response but stopped due to neuropathy  (c) gemcitabine 01/20/2016--?; multiple interruptions secondary to infections  (5) radiation therapy:  (a) T spine and Right femur, completed 01/10/2016  (6) letrozole/ palbociclib started 07/13/2016, discontinued 10/26/2020 with evidence of progression  (7) bone treatment:  (a) denosumab/Xgeva--discontinued January 2016 due to osteonecrosis of the jaw  (8) cancer associated pain: Updated 06/13/2021  (a) oxycodone/acetaminophen 10-325 Q AM  (b) PMP Aware reviewed 06/13/2021  (c) bowel prophylaxis: MiraLAX as needed  (9) restaging studies:1  (a) mostly  stable bone lesions with evidence of progression at L3-L5; no epidural  tumor by total spinal MRI in November 2019  (b) no evidence of progressive lung, lymph node or liver lesions by CT scans of the chest abdomen and pelvis and bone scan December 2019  (c) CT scans abd/pelvis 03/31/2019 shows stable bone metastases, no extraosseous disease  (d) MRI pelvis 04/09/2019 shows bilateral ilium, left acetabulum and lumbar mets, stable  (e) CT abd/pelvis 09/01/2019 shows stable bone mets, slight increase left effusion  (f) CT of the chest 05/05/2020 again shows minimal increase in the left pleural effusion, possible resorption of bone around the left glenoid lesion, otherwise stable  (g) CT of the chest abdomen and pelvis 10/18/2020 again shows no evidence of visceral disease, but there is apparent progression in the bone disease, and some enlargement of the left pleural effusion.  The CA 27-29 has also been rising.  Accordingly her letrozole and palbociclib are being discontinued  (10) palliative radiation 11/18/2018 through 12/02/2018 Site/dose:   1. Lumbar spine; 35 Gy in 14 fractions of 2.5 Gy                      2. Pelvis; 35 Gy in 14 fractions of 2.5 Gy  (11) started cyclophosphamide, methotrexate and fluorouracil [CMF] 11/10/2020  (a) methotrexate held first dose, during palliative radiation  (B) discontinued after 3 cycles (last dose 12/22/2020)  (C) CT chest 01/23/2021 showed worsening left effusion, cytologically positive  (12) status post left thoracentesis 11/21/2020, with positive cytology  (a) tumor cells are strongly estrogen and progesterone receptor positive, HER-2 negative  (b) foundation 1 requested on the thoracentesis sample shows a PI K3 CA mutation.  Microsatellite status is stable and the tumor mutation burden is low.  There are mutations in ESR 1, GAT A3, and [place] 6  (C) left pleural drain placed 02/10/2021  (13) status post Right humeral shaft intramedullary nail  fixation Right proximal humeral nonunion treatment 01/12/2021 [facility])  (14) alpelisib started January 27, 2021, fulvestrant started 01/26/2021  (a) alpelisib and fulvestrant discontinued June 2022 with HbA1c of 9.7  (15) started Doxil 04/11/2021, repeated every 21 days  (a) echo 03/28/2021 shows an ejection fraction in the 60-65% range  (B) Doxil discontinued after 05/02/2021 (second) dose with difficulty swallowing  (16) was to start carboplatin on 06/13/2021, repeated days 1 and 8 every 21 days--however patient decided against further chemotherapy treatments  (17) hospice referral placed on 06/13/2021   PLAN: Mia [name] is clinically improved off treatment.  She just generally feels better, has a better appetite, sense of taste, more energy, and currently is also free of nausea, dizziness, or pain.  The fly in the ointment is the fact that the Pleurx catheter apparently is not working.  Very likely it has clotted off.  We are going to obtain a plain chest x-ray today just to take an initial look but if that does not give [place] the answer she will likely need referral to interventional radiology and we could do that next week if necessary.  We are going to continues to see her on an every 4 to 6-week basis so long as that is useful to her and she prefers to be seen in person.  Accordingly have made a return appointment in about 4 weeks  Total encounter time 25 minutes.Sarajane Jews C. Djuna Frechette, MD 07/12/21 5:06 PM Medical Oncology and Hematology [facility] [place], [place] [address] Tel. [phone]    Fax. [phone]   IWilburn Mylar, am acting  as scribe for Dr. Sarajane Jews C. Caree Wolpert.  I, Lurline Del MD, have reviewed the above documentation for accuracy and completeness, and I agree with the above.   *Total Encounter Time as defined by the Centers for Medicare and Medicaid Services includes, in addition to the face-to-face time of a patient visit  (documented in the note above) non-face-to-face time: obtaining and reviewing outside history, ordering and reviewing medications, tests or procedures, care coordination (communications with other health care professionals or caregivers) and documentation in the medical record.

## 2021-07-13 DIAGNOSIS — Z86711 Personal history of pulmonary embolism: Secondary | ICD-10-CM | POA: Diagnosis not present

## 2021-07-13 DIAGNOSIS — C50919 Malignant neoplasm of unspecified site of unspecified female breast: Secondary | ICD-10-CM | POA: Diagnosis not present

## 2021-07-13 DIAGNOSIS — C7951 Secondary malignant neoplasm of bone: Secondary | ICD-10-CM | POA: Diagnosis not present

## 2021-07-13 DIAGNOSIS — I10 Essential (primary) hypertension: Secondary | ICD-10-CM | POA: Diagnosis not present

## 2021-07-13 DIAGNOSIS — C7800 Secondary malignant neoplasm of unspecified lung: Secondary | ICD-10-CM | POA: Diagnosis not present

## 2021-07-13 DIAGNOSIS — E1142 Type 2 diabetes mellitus with diabetic polyneuropathy: Secondary | ICD-10-CM | POA: Diagnosis not present

## 2021-07-13 LAB — CANCER ANTIGEN 27.29: CA 27.29: 285.2 U/mL — ABNORMAL HIGH (ref 0.0–38.6)

## 2021-07-17 ENCOUNTER — Encounter: Payer: Self-pay | Admitting: Family Medicine

## 2021-07-17 ENCOUNTER — Other Ambulatory Visit: Payer: Self-pay | Admitting: *Deleted

## 2021-07-17 ENCOUNTER — Other Ambulatory Visit: Payer: Self-pay | Admitting: Oncology

## 2021-07-17 ENCOUNTER — Telehealth: Payer: Self-pay

## 2021-07-17 ENCOUNTER — Encounter: Payer: Self-pay | Admitting: Oncology

## 2021-07-17 DIAGNOSIS — C7800 Secondary malignant neoplasm of unspecified lung: Secondary | ICD-10-CM

## 2021-07-17 DIAGNOSIS — J90 Pleural effusion, not elsewhere classified: Secondary | ICD-10-CM

## 2021-07-17 DIAGNOSIS — Z794 Long term (current) use of insulin: Secondary | ICD-10-CM

## 2021-07-17 MED ORDER — INSULIN GLARGINE-YFGN 100 UNIT/ML ~~LOC~~ SOPN
24.0000 [IU] | PEN_INJECTOR | Freq: Every day | SUBCUTANEOUS | 11 refills | Status: DC
  Filled 2021-07-17 – 2021-07-18 (×2): qty 3, 12d supply, fill #0
  Filled 2021-07-31: qty 3, 12d supply, fill #1

## 2021-07-17 NOTE — Telephone Encounter (Signed)
Received a call from Verne Spurr @ [facility]  [phone], stating that patient is on Lantus Pen and if we can send in a RX for [facility] Pen with same instructions to the [facility] due to that is covered on their formulary [facility]).   Please review and advise.  Thanks. Dm/cma

## 2021-07-17 NOTE — Addendum Note (Signed)
Addended by: Haydee Salter on: 07/17/2021 06:03 PM   Modules accepted: Orders

## 2021-07-18 ENCOUNTER — Encounter: Payer: Self-pay | Admitting: Oncology

## 2021-07-18 ENCOUNTER — Other Ambulatory Visit (HOSPITAL_COMMUNITY): Payer: Self-pay

## 2021-07-18 ENCOUNTER — Telehealth: Payer: Self-pay | Admitting: Family Medicine

## 2021-07-18 ENCOUNTER — Ambulatory Visit (HOSPITAL_COMMUNITY)
Admission: RE | Admit: 2021-07-18 | Discharge: 2021-07-18 | Disposition: A | Discharge status: Home / Self Care | Payer: Medicare Other | Source: Ambulatory Visit | Attending: Oncology | Admitting: Oncology

## 2021-07-18 ENCOUNTER — Other Ambulatory Visit: Payer: Self-pay

## 2021-07-18 DIAGNOSIS — Z853 Personal history of malignant neoplasm of breast: Secondary | ICD-10-CM | POA: Diagnosis not present

## 2021-07-18 DIAGNOSIS — Z4682 Encounter for fitting and adjustment of non-vascular catheter: Secondary | ICD-10-CM | POA: Diagnosis not present

## 2021-07-18 DIAGNOSIS — C7951 Secondary malignant neoplasm of bone: Secondary | ICD-10-CM | POA: Diagnosis not present

## 2021-07-18 DIAGNOSIS — Z9011 Acquired absence of right breast and nipple: Secondary | ICD-10-CM | POA: Insufficient documentation

## 2021-07-18 DIAGNOSIS — I10 Essential (primary) hypertension: Secondary | ICD-10-CM | POA: Diagnosis not present

## 2021-07-18 DIAGNOSIS — C50919 Malignant neoplasm of unspecified site of unspecified female breast: Secondary | ICD-10-CM | POA: Diagnosis not present

## 2021-07-18 DIAGNOSIS — J910 Malignant pleural effusion: Secondary | ICD-10-CM | POA: Diagnosis not present

## 2021-07-18 DIAGNOSIS — Z86711 Personal history of pulmonary embolism: Secondary | ICD-10-CM | POA: Diagnosis not present

## 2021-07-18 DIAGNOSIS — C7800 Secondary malignant neoplasm of unspecified lung: Secondary | ICD-10-CM | POA: Diagnosis not present

## 2021-07-18 DIAGNOSIS — C50911 Malignant neoplasm of unspecified site of right female breast: Secondary | ICD-10-CM | POA: Diagnosis not present

## 2021-07-18 DIAGNOSIS — E1142 Type 2 diabetes mellitus with diabetic polyneuropathy: Secondary | ICD-10-CM | POA: Diagnosis not present

## 2021-07-18 HISTORY — PX: IR RADIOLOGIST EVAL & MGMT: IMG5224

## 2021-07-18 NOTE — Telephone Encounter (Signed)
Spoke to [employer] and gave them the contact information for hospice so they can get the medication covered. Dm/cma

## 2021-07-18 NOTE — Telephone Encounter (Signed)
Lft VM to rtn call. Dm/cma  

## 2021-07-18 NOTE — Progress Notes (Addendum)
Patient ID: Mia [name], female   DOB: August 24, 1956, 65 y.o.   MRN: [id number] Mrs. Korzeniewski is a 65 year old female with history of stage IV breast cancer and prior right mastectomy who underwent placement of a left PleurX catheter for recurrent malignant left pleural effusion 02/10/2021.  Initially patient was removing 400-500 cc of pleural fluid periodically via the pleurx but approximately 18 days ago she noticed diminished return of fluid during attempts at removal.  Her last attempt approximately 1 week ago yielded only old clotted blood fragments.  She presents to the IR department today for further evaluation.  Chest x-ray from 07/17/2021 revealed complete opacification of the left hemithorax.  Limited ultrasound images of left posterior chest today reveals significant multi loculations but no free-flowing pleural fluid, likely combination of atelectasis, old blood components and tumor.  Pleurx was in appropriatae position. Attempts were made to irrigate catheter with minimal fluid return.  Patient was also examined by Dr. Maryelizabeth Kaufmann.  At this time will plan to keep catheter in place until further discussions with oncology regarding possible removal vs waiting to see if any additional free fluid develops in the interim.  A new dressing was applied to pleurx insertion site. Above discussed with patient and spouse.

## 2021-07-18 NOTE — Telephone Encounter (Signed)
[facility] is calling to request Levemir or Tyler Aas. Her insurance will not cover Rx #: [id number]  insulin glargine-yfgn (SEMGLEE) 100 UNIT/ML Pen [[id number]].   Pharmacy  [facility], [place] [address]  Phone:  [phone]  Fax:  [phone]  DEA #:  [id number]

## 2021-07-19 NOTE — Telephone Encounter (Signed)
Lft VM to rtn call. Dm/cma  

## 2021-07-20 DIAGNOSIS — Z86711 Personal history of pulmonary embolism: Secondary | ICD-10-CM | POA: Diagnosis not present

## 2021-07-20 DIAGNOSIS — C7951 Secondary malignant neoplasm of bone: Secondary | ICD-10-CM | POA: Diagnosis not present

## 2021-07-20 DIAGNOSIS — I10 Essential (primary) hypertension: Secondary | ICD-10-CM | POA: Diagnosis not present

## 2021-07-20 DIAGNOSIS — C7800 Secondary malignant neoplasm of unspecified lung: Secondary | ICD-10-CM | POA: Diagnosis not present

## 2021-07-20 DIAGNOSIS — C50919 Malignant neoplasm of unspecified site of unspecified female breast: Secondary | ICD-10-CM | POA: Diagnosis not present

## 2021-07-20 DIAGNOSIS — E1142 Type 2 diabetes mellitus with diabetic polyneuropathy: Secondary | ICD-10-CM | POA: Diagnosis not present

## 2021-07-21 ENCOUNTER — Other Ambulatory Visit: Payer: Self-pay | Admitting: Oncology

## 2021-07-21 DIAGNOSIS — C7800 Secondary malignant neoplasm of unspecified lung: Secondary | ICD-10-CM

## 2021-07-21 NOTE — Progress Notes (Signed)
I called Mia [name] and discussed the results of her iron evaluation.  She has a nonfunctional Pleurx.  It is not expected to become functional as the fluid in the lung has become organized.  Accordingly we will arrange to have the Pleurx removed.  At this point she tells me she is stable and has no other specific needs.  She does have an appointment with [place] already for next month.

## 2021-07-25 ENCOUNTER — Other Ambulatory Visit: Payer: MEDICARE

## 2021-07-25 ENCOUNTER — Ambulatory Visit: Payer: MEDICARE | Admitting: Oncology

## 2021-07-25 ENCOUNTER — Ambulatory Visit: Payer: MEDICARE

## 2021-07-27 ENCOUNTER — Encounter: Payer: Self-pay | Admitting: Oncology

## 2021-07-27 DIAGNOSIS — I10 Essential (primary) hypertension: Secondary | ICD-10-CM | POA: Diagnosis not present

## 2021-07-27 DIAGNOSIS — C7800 Secondary malignant neoplasm of unspecified lung: Secondary | ICD-10-CM | POA: Diagnosis not present

## 2021-07-27 DIAGNOSIS — C50919 Malignant neoplasm of unspecified site of unspecified female breast: Secondary | ICD-10-CM | POA: Diagnosis not present

## 2021-07-27 DIAGNOSIS — Z86711 Personal history of pulmonary embolism: Secondary | ICD-10-CM | POA: Diagnosis not present

## 2021-07-27 DIAGNOSIS — C7951 Secondary malignant neoplasm of bone: Secondary | ICD-10-CM | POA: Diagnosis not present

## 2021-07-27 DIAGNOSIS — E1142 Type 2 diabetes mellitus with diabetic polyneuropathy: Secondary | ICD-10-CM | POA: Diagnosis not present

## 2021-07-28 ENCOUNTER — Other Ambulatory Visit: Payer: Self-pay | Admitting: Internal Medicine

## 2021-07-28 ENCOUNTER — Encounter: Payer: Self-pay | Admitting: Oncology

## 2021-07-28 ENCOUNTER — Other Ambulatory Visit: Payer: Self-pay | Admitting: Radiology

## 2021-07-29 ENCOUNTER — Encounter: Payer: Self-pay | Admitting: Oncology

## 2021-07-30 ENCOUNTER — Encounter: Payer: Self-pay | Admitting: Oncology

## 2021-07-31 ENCOUNTER — Encounter (HOSPITAL_COMMUNITY): Payer: Self-pay

## 2021-07-31 ENCOUNTER — Other Ambulatory Visit (HOSPITAL_COMMUNITY): Payer: Self-pay

## 2021-07-31 ENCOUNTER — Ambulatory Visit (HOSPITAL_COMMUNITY)
Admission: RE | Admit: 2021-07-31 | Discharge: 2021-07-31 | Disposition: A | Discharge status: Home / Self Care | Source: Ambulatory Visit | Attending: Oncology | Admitting: Oncology

## 2021-07-31 ENCOUNTER — Encounter: Payer: Self-pay | Admitting: Oncology

## 2021-07-31 ENCOUNTER — Other Ambulatory Visit: Payer: Self-pay | Admitting: Oncology

## 2021-07-31 ENCOUNTER — Other Ambulatory Visit: Payer: Self-pay

## 2021-07-31 DIAGNOSIS — C7951 Secondary malignant neoplasm of bone: Secondary | ICD-10-CM | POA: Diagnosis not present

## 2021-07-31 DIAGNOSIS — Z794 Long term (current) use of insulin: Secondary | ICD-10-CM | POA: Insufficient documentation

## 2021-07-31 DIAGNOSIS — Z4803 Encounter for change or removal of drains: Secondary | ICD-10-CM | POA: Diagnosis not present

## 2021-07-31 DIAGNOSIS — Z885 Allergy status to narcotic agent status: Secondary | ICD-10-CM | POA: Insufficient documentation

## 2021-07-31 DIAGNOSIS — Z7984 Long term (current) use of oral hypoglycemic drugs: Secondary | ICD-10-CM | POA: Diagnosis not present

## 2021-07-31 DIAGNOSIS — Z79899 Other long term (current) drug therapy: Secondary | ICD-10-CM | POA: Insufficient documentation

## 2021-07-31 DIAGNOSIS — R0609 Other forms of dyspnea: Secondary | ICD-10-CM | POA: Diagnosis not present

## 2021-07-31 DIAGNOSIS — R059 Cough, unspecified: Secondary | ICD-10-CM | POA: Insufficient documentation

## 2021-07-31 DIAGNOSIS — Z853 Personal history of malignant neoplasm of breast: Secondary | ICD-10-CM | POA: Diagnosis not present

## 2021-07-31 DIAGNOSIS — E1142 Type 2 diabetes mellitus with diabetic polyneuropathy: Secondary | ICD-10-CM | POA: Diagnosis not present

## 2021-07-31 DIAGNOSIS — Z86711 Personal history of pulmonary embolism: Secondary | ICD-10-CM | POA: Diagnosis not present

## 2021-07-31 DIAGNOSIS — C7800 Secondary malignant neoplasm of unspecified lung: Secondary | ICD-10-CM

## 2021-07-31 DIAGNOSIS — Z7901 Long term (current) use of anticoagulants: Secondary | ICD-10-CM | POA: Diagnosis not present

## 2021-07-31 DIAGNOSIS — C50919 Malignant neoplasm of unspecified site of unspecified female breast: Secondary | ICD-10-CM | POA: Diagnosis not present

## 2021-07-31 DIAGNOSIS — I10 Essential (primary) hypertension: Secondary | ICD-10-CM | POA: Diagnosis not present

## 2021-07-31 HISTORY — PX: IR REMOVAL OF PLURAL CATH W/CUFF: IMG5346

## 2021-07-31 LAB — GLUCOSE, CAPILLARY
Glucose-Capillary: 95 mg/dL (ref 70–99)
Glucose-Capillary: 83 mg/dL (ref 70–99)
Glucose-Capillary: 81 mg/dL (ref 70–99)

## 2021-07-31 MED ORDER — SODIUM CHLORIDE 0.9 % IV SOLN: INTRAVENOUS | Status: DC

## 2021-07-31 MED ORDER — FENTANYL CITRATE (PF) 100 MCG/2ML IJ SOLN
INTRAMUSCULAR | Status: AC | PRN
  Administered 2021-07-31: 50 ug via INTRAVENOUS

## 2021-07-31 MED ORDER — MIDAZOLAM HCL 2 MG/2ML IJ SOLN
INTRAMUSCULAR | Status: AC
  Filled 2021-07-31: qty 4

## 2021-07-31 MED ORDER — MIDAZOLAM HCL 2 MG/2ML IJ SOLN
INTRAMUSCULAR | Status: AC | PRN
  Administered 2021-07-31: 1 mg via INTRAVENOUS

## 2021-07-31 MED ORDER — HEPARIN SOD (PORK) LOCK FLUSH 100 UNIT/ML IV SOLN
500.0000 [IU] | Freq: Once | INTRAVENOUS | Status: AC
  Administered 2021-07-31: 500 [IU] via INTRAVENOUS
  Filled 2021-07-31: qty 5

## 2021-07-31 MED ORDER — FENTANYL CITRATE (PF) 100 MCG/2ML IJ SOLN
INTRAMUSCULAR | Status: AC
  Filled 2021-07-31: qty 2

## 2021-07-31 MED ORDER — NALOXONE HCL 0.4 MG/ML IJ SOLN
INTRAMUSCULAR | Status: AC
  Filled 2021-07-31: qty 1

## 2021-07-31 MED ORDER — LIDOCAINE-EPINEPHRINE 1 %-1:100000 IJ SOLN
INTRAMUSCULAR | Status: AC
  Filled 2021-07-31: qty 1

## 2021-07-31 MED ORDER — FLUMAZENIL 0.5 MG/5ML IV SOLN
INTRAVENOUS | Status: AC
  Filled 2021-07-31: qty 5

## 2021-07-31 NOTE — H&P (Signed)
Referring Physician(s): Chauncey Cruel  Supervising Physician: Daryll Brod  Patient Status:  [facility]  Chief Complaint:  "I'm getting my chest drain out"  Subjective: Patient familiar to IR service from left Pleurx catheter placement on 02/10/2021 and Port-A-Cath placement on 03/30/2021.  She has also undergone left thoracenteses x3, latest on 01/13/2021.  She has a history of metastatic breast carcinoma with recurrent malignant left pleural effusion.  She was recently seen by our service on 07/18/2021 secondary to minimal output from the Pleurx.  At that time limited ultrasound images revealed significant multi loculations but no free-flowing pleural fluid.  Despite catheter irrigation no additional fluid could be removed.  She presents today for Pleurx catheter removal.  She denies fever, headache, worsening chest pain, abdominal/back pain, nausea, vomiting or bleeding.  She does have chronic dyspnea and occasional cough.  She is on 2 L O2 via nasal cannula at home.  Additional history as below.  Past Medical History:  Diagnosis Date   Breast cancer metastasized to bone [facility])    Diabetes mellitus without complication ([place])    Dyspnea    History of radiation therapy 11/18/2018-12/02/2018   lumbar spine and pelvis   Dr [name]   History of radiation therapy 11/08/2020-11/18/2020   right arm   Dr [name]   Hyperlipidemia    Hypertension    Lymphedema of right arm    Neuropathy associated with cancer ([place])    Obesity (BMI 35.0-39.9 without comorbidity)    PONV (postoperative nausea and vomiting)    Pulmonary embolism ([place]) 12/2020   Past Surgical History:  Procedure Laterality Date   CATARACT EXTRACTION Left    HUMERUS IM NAIL Right 01/11/2021   Procedure: INTRAMEDULLARY (IM) NAIL HUMERAL;  Surgeon: Hiram Gash, MD;  Location: [facility];  Service: Orthopedics;  Laterality: Right;   HYSTERECTOMY ABDOMINAL WITH SALPINGO-OOPHORECTOMY     IR IMAGING GUIDED PORT INSERTION   03/30/2021   IR PERC PLEURAL DRAIN W/INDWELL CATH W/IMG GUIDE  02/10/2021   IR RADIOLOGIST EVAL & MGMT  07/18/2021   MODIFIED RADICAL MASTECTOMY Right       Allergies: Morphine and related  Medications: Prior to Admission medications   Medication Sig Start Date End Date Taking? Authorizing Provider  gabapentin (NEURONTIN) 300 MG capsule Take 1 capsule (300 mg total) by mouth 4 (four) times daily. Pt is only taking 2 capsules a day 05/23/21  Yes Magrinat, Virgie Dad, MD  insulin glargine-yfgn (SEMGLEE) 100 UNIT/ML Pen Inject 24 units into the skin daily. 07/17/21  Yes Rudd, Lillette Boxer, MD  JANUMET 50-1000 MG tablet TAKE 1 TABLET BY MOUTH EVERY DAY WITH BREAKFAST 03/30/21  Yes Cirigliano, Mary K, DO  oxyCODONE-acetaminophen (PERCOCET) 10-325 MG tablet Take 1 tablet by mouth 2 (two) times daily as needed for pain. 05/23/21  Yes Magrinat, Virgie Dad, MD  pantoprazole (PROTONIX) 40 MG tablet Take 1 tablet (40 mg total) by mouth daily. 05/22/21  Yes Haydee Salter, MD  rivaroxaban (XARELTO) 20 MG TABS tablet Take 1 tablet (20 mg total) by mouth daily with supper. 06/13/21  Yes Magrinat, Virgie Dad, MD  blood glucose meter kit and supplies KIT Dispense based on patient and insurance preference. Use up to four times daily as directed. 01/31/21   Magrinat, Virgie Dad, MD  fluticasone (FLONASE) 50 MCG/ACT nasal spray SPRAY 2 SPRAYS INTO EACH NOSTRIL EVERY DAY 03/30/21   Cirigliano, Mary K, DO  glucose blood ([employer] GLUCOSE METER TEST STRIPS) test strip Use as instructed  02/06/21   Ronnald Nian, DO  glucose blood test strip E11.69 Use as instructed 03/21/21   Cirigliano, Stanton Kidney K, DO  ibuprofen (ADVIL) 200 MG tablet Take 1-2 tablets (200-400 mg total) by mouth every 8 (eight) hours as needed for moderate pain. 06/13/21   Magrinat, Virgie Dad, MD  Insulin Admin Supplies MISC Inject 24 Units into the skin daily.    [provider]  Insulin Pen Needle (B-D UF III MINI PEN NEEDLES) 31G X 5 MM MISC USE AS DIRECTED DAILY  E11.9 06/19/21   Libby Maw, MD  Lancets (FREESTYLE) lancets Use as instructed 02/06/21   Cirigliano, Garvin Fila, DO  ONETOUCH ULTRA test strip DISPENSE BASED ON PATIENT AND INSURANCE PREFERENCE. USE UP TO FOUR TIMES DAILY AS DIRECTED. 06/19/21   Magrinat, Virgie Dad, MD  pioglitazone (ACTOS) 15 MG tablet TAKE 1 TABLET BY MOUTH EVERY DAY 03/30/21   Cirigliano, Garvin Fila, DO  prochlorperazine (COMPAZINE) 10 MG tablet Take 1 tablet (10 mg total) by mouth every 6 (six) hours as needed (Nausea or vomiting). 06/13/21 06/15/21  Magrinat, Virgie Dad, MD     Vital Signs: LMP 10/01/2006 Comment: Total Hysterectomy  Physical Exam awake, alert.  Chest with diminished breath sounds on left, right clear.  Right chest wall Port-A-Cath.  Left Pleurx catheter in place covered with clean gauze dressing.  Heart with tachycardic but regular rhythm.  Abdomen soft, positive bowel sounds, nontender.  Bilateral lower extremity edema noted  Imaging: No results found.  Labs:  CBC: Recent Labs    05/23/21 1119 05/30/21 0819 06/13/21 1054 07/12/21 1530  WBC 3.7* 9.3 7.1 10.0  HGB 7.8* 8.0* 10.2* 7.2*  HCT 25.4* 26.1* 32.2* 23.8*  PLT 563* 556* 376 590*    COAGS: Recent Labs    01/10/21 0434 02/10/21 1035  INR 1.1 1.3*    BMP: Recent Labs    05/23/21 1119 05/30/21 0819 06/13/21 1054 07/12/21 1539  NA 134* 132* 134* 138  K 4.2 3.8 4.1 4.2  CL 94* 93* 92* 97*  CO2 29 29 32 32  GLUCOSE 107* 126* 87 93  BUN '11 11 12 11  ' CALCIUM 9.2 9.4 10.6* 10.0  CREATININE 0.71 0.62 0.63 0.58  GFRNONAA >60 >60 >60 >60    LIVER FUNCTION TESTS: Recent Labs    05/23/21 1119 05/30/21 0819 06/13/21 1054 07/12/21 1539  BILITOT 0.4 0.6 0.7 0.4  AST 20 32 22 17  ALT '7 13 7 7  ' ALKPHOS 121 133* 152* 175*  PROT 6.3* 5.9* 5.7* 6.1*  ALBUMIN 2.3* 2.0* 1.8* 2.1*    Assessment and Plan: Patient familiar to IR service from left Pleurx catheter placement on 02/10/2021 and Port-A-Cath placement on 03/30/2021.  She  has also undergone left thoracenteses x3, latest on 01/13/2021.  She has a history of metastatic breast carcinoma with recurrent malignant left pleural effusion.  She was recently seen by our service on 07/18/2021 secondary to minimal output from the Pleurx.  At that time limited ultrasound images revealed significant multi loculations but no free-flowing pleural fluid.  Despite catheter irrigation no additional fluid could be removed.  She presents today for Pleurx catheter removal.  Details/risks of procedure, including but not limited to, internal bleeding, infection, injury to adjacent structures discussed with patient with her understanding and consent.   Electronically Signed: D. Rowe Robert, PA-C 07/31/2021, 1:28 PM   I spent a total of 20 minutes at the the patient's bedside AND on the patient's hospital floor or unit, greater  than 50% of which was counseling/coordinating care for left Pleurx catheter removal

## 2021-07-31 NOTE — Discharge Instructions (Addendum)
IInterventional radiology phone numbers [phone] After hours [phone]      Indwelling Pleural Catheter Removal Home Guide   How to care for your insertion site Wash your hands with soap and warm water before and after touching the catheter or insertion site. If soap and water are not available, use hand sanitizer. Check your bandage (dressing) daily to make sure it is clean and dry. Check your  insertion site every day for signs of infection. Check for: Skin breakdown. Redness, swelling, or pain. Fluid or blood. Warmth. Pus or a bad smell.  How to change your dressing Change your dressing at least once a week, or more often if needed to keep the dressing dry. Be sure to change the dressing whenever it becomes moist. Your health care provider will tell you how often to change your dressing. Wash your hands with soap and warm water. If soap and water are not available, use hand sanitizer. Gently remove the old dressing.  Wash the skin around the insertion site with mild, fragrance-free soap and warm water. Rinse well, then pat the area dry with a clean cloth. Check the skin for signs of infection. Check for: Skin breakdown. Redness, swelling, or pain. Fluid or blood. Warmth. Pus or a bad smell.      Moderate Conscious Sedation, Adult, Care After This sheet gives you information about how to care for yourself after your procedure. Your health care provider may also give you more specific instructions. If you have problems or questions, contact your health care provider. What can I expect after the procedure? After the procedure, it is common to have: Sleepiness for several hours. Impaired judgment for several hours. Difficulty with balance. Vomiting if you eat too soon. Follow these instructions at home: For the time period you were told by your health care provider: Rest. Do not participate in activities where you could fall or become injured. Do not drive or  use machinery. Do not drink alcohol. Do not take sleeping pills or medicines that cause drowsiness. Do not make important decisions or sign legal documents. Do not take care of children on your own.      Eating and drinking Follow the diet recommended by your health care provider. Drink enough fluid to keep your urine pale yellow. If you vomit: Drink water, juice, or soup when you can drink without vomiting. Make sure you have little or no nausea before eating solid foods.   General instructions Take over-the-counter and prescription medicines only as told by your health care provider. Have a responsible adult stay with you for the time you are told. It is important to have someone help care for you until you are awake and alert. Do not smoke. Keep all follow-up visits as told by your health care provider. This is important. Contact a health care provider if: You are still sleepy or having trouble with balance after 24 hours. You feel light-headed. You keep feeling nauseous or you keep vomiting. You develop a rash. You have a fever. You have redness or swelling around the IV site. Get help right away if: You have trouble breathing. You have new-onset confusion at home. Summary After the procedure, it is common to feel sleepy, have impaired judgment, or feel nauseous if you eat too soon. Rest after you get home. Know the things you should not do after the procedure. Follow the diet recommended by your health care provider and drink enough fluid to keep your urine pale yellow. Get help right away  if you have trouble breathing or new-onset confusion at home. This information is not intended to replace advice given to you by your health care provider. Make sure you discuss any questions you have with your health care provider. Document Revised: 01/15/2020 Document Reviewed: 08/13/2019 Elsevier Patient Education  2021 [employer].

## 2021-07-31 NOTE — Procedures (Signed)
Interventional Radiology Procedure Note  Procedure: left pleurx removal    Complications: None  Estimated Blood Loss:  min  Findings: Full report in pacs    Tamera Punt, MD

## 2021-08-01 ENCOUNTER — Encounter: Payer: Self-pay | Admitting: Oncology

## 2021-08-01 DIAGNOSIS — Z86711 Personal history of pulmonary embolism: Secondary | ICD-10-CM | POA: Diagnosis not present

## 2021-08-01 DIAGNOSIS — Z803 Family history of malignant neoplasm of breast: Secondary | ICD-10-CM | POA: Diagnosis not present

## 2021-08-01 DIAGNOSIS — C7800 Secondary malignant neoplasm of unspecified lung: Secondary | ICD-10-CM | POA: Diagnosis not present

## 2021-08-01 DIAGNOSIS — Z9221 Personal history of antineoplastic chemotherapy: Secondary | ICD-10-CM | POA: Diagnosis not present

## 2021-08-01 DIAGNOSIS — C7951 Secondary malignant neoplasm of bone: Secondary | ICD-10-CM | POA: Diagnosis not present

## 2021-08-01 DIAGNOSIS — Z170 Estrogen receptor positive status [ER+]: Secondary | ICD-10-CM | POA: Diagnosis not present

## 2021-08-01 DIAGNOSIS — Z9011 Acquired absence of right breast and nipple: Secondary | ICD-10-CM | POA: Diagnosis not present

## 2021-08-01 DIAGNOSIS — Z9981 Dependence on supplemental oxygen: Secondary | ICD-10-CM | POA: Diagnosis not present

## 2021-08-01 DIAGNOSIS — C50919 Malignant neoplasm of unspecified site of unspecified female breast: Secondary | ICD-10-CM | POA: Diagnosis not present

## 2021-08-01 DIAGNOSIS — E1142 Type 2 diabetes mellitus with diabetic polyneuropathy: Secondary | ICD-10-CM | POA: Diagnosis not present

## 2021-08-01 DIAGNOSIS — I10 Essential (primary) hypertension: Secondary | ICD-10-CM | POA: Diagnosis not present

## 2021-08-01 DIAGNOSIS — K219 Gastro-esophageal reflux disease without esophagitis: Secondary | ICD-10-CM | POA: Diagnosis not present

## 2021-08-02 ENCOUNTER — Other Ambulatory Visit (HOSPITAL_COMMUNITY): Payer: Self-pay

## 2021-08-02 DIAGNOSIS — C50919 Malignant neoplasm of unspecified site of unspecified female breast: Secondary | ICD-10-CM | POA: Diagnosis not present

## 2021-08-02 DIAGNOSIS — I10 Essential (primary) hypertension: Secondary | ICD-10-CM | POA: Diagnosis not present

## 2021-08-02 DIAGNOSIS — C7951 Secondary malignant neoplasm of bone: Secondary | ICD-10-CM | POA: Diagnosis not present

## 2021-08-02 DIAGNOSIS — C7800 Secondary malignant neoplasm of unspecified lung: Secondary | ICD-10-CM | POA: Diagnosis not present

## 2021-08-02 DIAGNOSIS — E1142 Type 2 diabetes mellitus with diabetic polyneuropathy: Secondary | ICD-10-CM | POA: Diagnosis not present

## 2021-08-02 DIAGNOSIS — Z86711 Personal history of pulmonary embolism: Secondary | ICD-10-CM | POA: Diagnosis not present

## 2021-08-02 MED ORDER — INSULIN GLARGINE-YFGN 100 UNIT/ML ~~LOC~~ SOPN
PEN_INJECTOR | SUBCUTANEOUS | 11 refills | Status: AC
  Filled 2021-08-02: qty 6, 25d supply, fill #0

## 2021-08-03 ENCOUNTER — Encounter: Payer: Self-pay | Admitting: Oncology

## 2021-08-03 DIAGNOSIS — Z86711 Personal history of pulmonary embolism: Secondary | ICD-10-CM | POA: Diagnosis not present

## 2021-08-03 DIAGNOSIS — I10 Essential (primary) hypertension: Secondary | ICD-10-CM | POA: Diagnosis not present

## 2021-08-03 DIAGNOSIS — C7800 Secondary malignant neoplasm of unspecified lung: Secondary | ICD-10-CM | POA: Diagnosis not present

## 2021-08-03 DIAGNOSIS — E1142 Type 2 diabetes mellitus with diabetic polyneuropathy: Secondary | ICD-10-CM | POA: Diagnosis not present

## 2021-08-03 DIAGNOSIS — C50919 Malignant neoplasm of unspecified site of unspecified female breast: Secondary | ICD-10-CM | POA: Diagnosis not present

## 2021-08-03 DIAGNOSIS — C7951 Secondary malignant neoplasm of bone: Secondary | ICD-10-CM | POA: Diagnosis not present

## 2021-08-04 ENCOUNTER — Encounter: Payer: Self-pay | Admitting: Oncology

## 2021-08-04 ENCOUNTER — Other Ambulatory Visit (HOSPITAL_COMMUNITY): Payer: Self-pay

## 2021-08-05 ENCOUNTER — Encounter: Payer: Self-pay | Admitting: Oncology

## 2021-08-06 ENCOUNTER — Encounter: Payer: Self-pay | Admitting: Oncology

## 2021-08-07 ENCOUNTER — Encounter: Payer: Self-pay | Admitting: Oncology

## 2021-08-08 ENCOUNTER — Other Ambulatory Visit: Payer: Self-pay

## 2021-08-08 ENCOUNTER — Ambulatory Visit (INDEPENDENT_AMBULATORY_CARE_PROVIDER_SITE_OTHER): Admitting: Family Medicine

## 2021-08-08 ENCOUNTER — Encounter: Payer: Self-pay | Admitting: Oncology

## 2021-08-08 VITALS — BP 118/70 | HR 116 | Temp 97.7°F | Ht 64.0 in | Wt 190.0 lb

## 2021-08-08 DIAGNOSIS — C7951 Secondary malignant neoplasm of bone: Secondary | ICD-10-CM | POA: Diagnosis not present

## 2021-08-08 DIAGNOSIS — C50919 Malignant neoplasm of unspecified site of unspecified female breast: Secondary | ICD-10-CM | POA: Diagnosis not present

## 2021-08-08 DIAGNOSIS — Z86711 Personal history of pulmonary embolism: Secondary | ICD-10-CM | POA: Diagnosis not present

## 2021-08-08 DIAGNOSIS — I10 Essential (primary) hypertension: Secondary | ICD-10-CM | POA: Diagnosis not present

## 2021-08-08 DIAGNOSIS — Z794 Long term (current) use of insulin: Secondary | ICD-10-CM | POA: Diagnosis not present

## 2021-08-08 DIAGNOSIS — E119 Type 2 diabetes mellitus without complications: Secondary | ICD-10-CM

## 2021-08-08 DIAGNOSIS — C7800 Secondary malignant neoplasm of unspecified lung: Secondary | ICD-10-CM | POA: Diagnosis not present

## 2021-08-08 DIAGNOSIS — E1142 Type 2 diabetes mellitus with diabetic polyneuropathy: Secondary | ICD-10-CM | POA: Diagnosis not present

## 2021-08-08 NOTE — Progress Notes (Signed)
[facility] [address] [place] [address] Dept: [phone] Dept Fax: [phone]  Office Visit  Subjective:    Patient ID: Mia [name], female    DOB: [birth year], 65 y.o..   MRN: [id number]  Chief Complaint  Patient presents with   Follow-up    6 week f/u.  No concerns.      History of Present Illness:  Patient is in today for reassessment of her chronic medical issues.  Mia [name] has a history of Stage IV breast cancer, with metastatic disease to the lungs and multiple bones (skull, multiple levels of the spine, pelvis, and knee caps). She has undergone mastectomy and multiple rounds of various chemotherapies. Having exhausted reasonable therapies, Mia [name] has shifted to a focus of palliative care. 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 She recently had her pleural catheter removed, as the effusion has calcified and was no longer allowing any drainage. She feels better with this out.She remains on supplemental oxygen, usually at 2 lpm. Hospice is coming by her home about once a week. Mia [name] notes she has pain in the left scapular area where she has a known bony metastasis. She has been avoiding NSAIDs due to increased bleeding risk while on Xarelto. She has not found that Percocet works very well for this pain.   Mia [name] has had Type 2 diabetes for the past 22 years. She has lost 22 lbs in the past 9 months. Her recent A1c was 5.5. She is currently managed on Lantus insulin (24 units daily), pioglitazone, and metformin. Recently, she had an episode where she passed out twice in short order. She did not check her blood sugar so wasn't sure if she might have had a hypoglycemic episode. She also notes she has trouble with poor appetitie much of the time and finds it difficult to drink enough fluids.  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   GERD (gastroesophageal reflux disease) 05/22/2021   Port-A-Cath in place 04/11/2021   Iron deficiency anemia 04/11/2021   Pleural effusion 01/05/2021   AKI (acute kidney injury) ([place]) 01/05/2021   Hyponatremia 01/05/2021   Shoulder pain, right 01/05/2021   Drug-induced neutropenia ([place]) 12/04/2018   Hyperlipidemia 06/03/2018   Metastatic breast cancer ([place]) 03/12/2018   Bone metastases ([place]) 03/12/2018   Lung metastases ([place]) 03/12/2018   Osteonecrosis ([place]) 03/12/2018   Peripheral neuropathy due to chemotherapy ([place]) 03/12/2018   Type 2 diabetes mellitus ([place]) 03/12/2018   Hepatic steatosis 03/12/2018   Aortic atherosclerosis ([place]) 03/12/2018   Past Surgical History:  Procedure Laterality Date   CATARACT EXTRACTION Left    HUMERUS IM NAIL Right 01/11/2021   Procedure: INTRAMEDULLARY (IM) NAIL HUMERAL;  Surgeon: Hiram Gash, MD;  Location: [facility];  Service: Orthopedics;  Laterality: Right;   HYSTERECTOMY ABDOMINAL WITH SALPINGO-OOPHORECTOMY     IR IMAGING GUIDED PORT INSERTION  03/30/2021   IR PERC PLEURAL DRAIN W/INDWELL CATH W/IMG GUIDE  02/10/2021   IR RADIOLOGIST EVAL & MGMT  07/18/2021   IR REMOVAL OF PLURAL CATH W/CUFF  07/31/2021   MODIFIED RADICAL MASTECTOMY Right    Family History  Problem Relation Age of Onset   Diabetes Mother    Breast cancer Mother 50   Cerebral aneurysm Mother    Pulmonary embolism Father    Colon cancer Father 63   Breast cancer  Sister 46       noninvasive   Breast cancer Paternal Grandmother 78   Outpatient Medications Prior to Visit  Medication Sig Dispense Refill   blood glucose meter kit and supplies KIT Dispense based on patient and insurance preference. Use up to four times daily as directed. 1 each 0   fluticasone (FLONASE) 50 MCG/ACT nasal spray SPRAY 2 SPRAYS INTO EACH NOSTRIL EVERY DAY 48 mL 1   gabapentin (NEURONTIN) 300 MG capsule Take  1 capsule (300 mg total) by mouth 4 (four) times daily. Pt is only taking 2 capsules a day 180 capsule 0   glucose blood ([employer] GLUCOSE METER TEST STRIPS) test strip Use as instructed 100 each 12   glucose blood test strip E11.69 Use as instructed 100 each 12   ibuprofen (ADVIL) 200 MG tablet Take 1-2 tablets (200-400 mg total) by mouth every 8 (eight) hours as needed for moderate pain.     Insulin Admin Supplies MISC Inject 24 Units into the skin daily.     insulin glargine-yfgn (SEMGLEE) 100 UNIT/ML Pen Inject 24 units into the skin daily 9 mL 11   Insulin Pen Needle (B-D UF III MINI PEN NEEDLES) 31G X 5 MM MISC USE AS DIRECTED DAILY E11.9 100 each PRN   Lancets (FREESTYLE) lancets Use as instructed 100 each 12   metFORMIN (GLUCOPHAGE) 500 MG tablet Take 500 mg by mouth 2 (two) times daily.     ONETOUCH ULTRA test strip DISPENSE BASED ON PATIENT AND INSURANCE PREFERENCE. USE UP TO FOUR TIMES DAILY AS DIRECTED. 100 strip 3   oxyCODONE-acetaminophen (PERCOCET) 10-325 MG tablet Take 1 tablet by mouth 2 (two) times daily as needed for pain. 30 tablet 0   pantoprazole (PROTONIX) 40 MG tablet Take 1 tablet (40 mg total) by mouth daily. 90 tablet 3   pioglitazone (ACTOS) 15 MG tablet TAKE 1 TABLET BY MOUTH EVERY DAY 90 tablet 1   rivaroxaban (XARELTO) 20 MG TABS tablet Take 1 tablet (20 mg total) by mouth daily with supper. 30 tablet 3   JANUMET 50-1000 MG tablet TAKE 1 TABLET BY MOUTH EVERY DAY WITH BREAKFAST 90 tablet 1   No facility-administered medications prior to visit.   Allergies  Allergen Reactions   Morphine And Related Anaphylaxis      Objective:   Today's Vitals   08/08/21 1105  BP: 118/70  Pulse: (!) 116  Temp: 97.7 F (36.5 C)  TempSrc: Temporal  SpO2: 99%  Weight: 190 lb (86.2 kg)  Height: _0  (1.626 m)   Body mass index is 32.61 kg/m.   General: No acute distress. Psych: Alert and oriented. Normal mood and affect.  Health Maintenance Due  Topic Date Due   Zoster  Vaccines- Shingrix (1 of 2) Never done   Pneumonia Vaccine 18+ Years old (2 - PCV) 08/11/2009   MAMMOGRAM  06/03/2018   FOOT EXAM  06/04/2019   COVID-19 Vaccine (4 - Booster for Pfizer series) 10/11/2020   OPHTHALMOLOGY EXAM  12/02/2020   URINE MICROALBUMIN  02/16/2021   DEXA SCAN  Never done   Lab Results Lab Results  Component Value Date   HGBA1C 5.5 06/27/2021   CMP Latest Ref Rng & Units 07/12/2021 06/13/2021 05/30/2021  Glucose 70 - 99 mg/dL 93 87 126(H)  BUN 8 - 23 mg/dL _1 Creatinine 0.44 - 1.00 mg/dL 0.58 0.63 0.62  Sodium 135 - 145 mmol/L 138 134(L) 132(L)  Potassium 3.5 - 5.1 mmol/L 4.2 4.1 3.8  Chloride 98 - 111 mmol/L 97(L) 92(L) 93(L)  CO2 22 - 32 mmol/L 32 32 29  Calcium 8.9 - 10.3 mg/dL 10.0 10.6(H) 9.4  Total Protein 6.5 - 8.1 g/dL 6.1(L) 5.7(L) 5.9(L)  Total Bilirubin 0.3 - 1.2 mg/dL 0.4 0.7 0.6  Alkaline Phos 38 - 126 U/L 175(H) 152(H) 133(H)  AST 15 - 41 U/L 17 22 32  ALT 0 - 44 U/L _0 CBC Latest Ref Rng & Units 07/12/2021 06/13/2021 05/30/2021  WBC 4.0 - 10.5 K/uL 10.0 7.1 9.3  Hemoglobin 12.0 - 15.0 g/dL 7.2(L) 10.2(L) 8.0(L)  Hematocrit 36.0 - 46.0 % 23.8(L) 32.2(L) 26.1(L)  Platelets 150 - 400 K/uL 590(H) 376 556(H)      Assessment & Plan:   1. Metastatic breast cancer ([place]) Currently focused on palliative care. We discussed that NSAIDs have been found to be more effective for pain associated with bony metastases than opioids. We discussed the risk vs. benefits of using a small amount of ibuprofen on bleeding risk vs. improved pain management. She will try taking 200 mg during the day to see if it improves her pain and function.  2. Type 2 diabetes mellitus without complication, with long-term current use of insulin ([place]) Ms. Tarpley's A1c is in the normal range. I do feel there could be risk for hypoglycemia. I recommended we give her a trial off of her insulin. She iwll monitor her fasting blood sugars daily and let me know if these are  running > 120.  Haydee Salter, MD

## 2021-08-09 DIAGNOSIS — I10 Essential (primary) hypertension: Secondary | ICD-10-CM | POA: Diagnosis not present

## 2021-08-09 DIAGNOSIS — C50919 Malignant neoplasm of unspecified site of unspecified female breast: Secondary | ICD-10-CM | POA: Diagnosis not present

## 2021-08-09 DIAGNOSIS — Z86711 Personal history of pulmonary embolism: Secondary | ICD-10-CM | POA: Diagnosis not present

## 2021-08-09 DIAGNOSIS — C7951 Secondary malignant neoplasm of bone: Secondary | ICD-10-CM | POA: Diagnosis not present

## 2021-08-09 DIAGNOSIS — E1142 Type 2 diabetes mellitus with diabetic polyneuropathy: Secondary | ICD-10-CM | POA: Diagnosis not present

## 2021-08-09 DIAGNOSIS — C7800 Secondary malignant neoplasm of unspecified lung: Secondary | ICD-10-CM | POA: Diagnosis not present

## 2021-08-10 ENCOUNTER — Ambulatory Visit: Payer: MEDICARE | Admitting: Adult Health

## 2021-08-10 ENCOUNTER — Encounter: Payer: Self-pay | Admitting: Oncology

## 2021-08-12 ENCOUNTER — Other Ambulatory Visit (HOSPITAL_COMMUNITY): Payer: Self-pay

## 2021-08-17 DIAGNOSIS — C7800 Secondary malignant neoplasm of unspecified lung: Secondary | ICD-10-CM | POA: Diagnosis not present

## 2021-08-17 DIAGNOSIS — C50919 Malignant neoplasm of unspecified site of unspecified female breast: Secondary | ICD-10-CM | POA: Diagnosis not present

## 2021-08-17 DIAGNOSIS — Z86711 Personal history of pulmonary embolism: Secondary | ICD-10-CM | POA: Diagnosis not present

## 2021-08-17 DIAGNOSIS — E1142 Type 2 diabetes mellitus with diabetic polyneuropathy: Secondary | ICD-10-CM | POA: Diagnosis not present

## 2021-08-17 DIAGNOSIS — C7951 Secondary malignant neoplasm of bone: Secondary | ICD-10-CM | POA: Diagnosis not present

## 2021-08-17 DIAGNOSIS — I10 Essential (primary) hypertension: Secondary | ICD-10-CM | POA: Diagnosis not present

## 2021-08-21 ENCOUNTER — Encounter: Payer: Self-pay | Admitting: Oncology

## 2021-08-23 DIAGNOSIS — C7800 Secondary malignant neoplasm of unspecified lung: Secondary | ICD-10-CM | POA: Diagnosis not present

## 2021-08-23 DIAGNOSIS — C50919 Malignant neoplasm of unspecified site of unspecified female breast: Secondary | ICD-10-CM | POA: Diagnosis not present

## 2021-08-23 DIAGNOSIS — Z86711 Personal history of pulmonary embolism: Secondary | ICD-10-CM | POA: Diagnosis not present

## 2021-08-23 DIAGNOSIS — I10 Essential (primary) hypertension: Secondary | ICD-10-CM | POA: Diagnosis not present

## 2021-08-23 DIAGNOSIS — C7951 Secondary malignant neoplasm of bone: Secondary | ICD-10-CM | POA: Diagnosis not present

## 2021-08-23 DIAGNOSIS — E1142 Type 2 diabetes mellitus with diabetic polyneuropathy: Secondary | ICD-10-CM | POA: Diagnosis not present

## 2021-08-31 DIAGNOSIS — Z170 Estrogen receptor positive status [ER+]: Secondary | ICD-10-CM | POA: Diagnosis not present

## 2021-08-31 DIAGNOSIS — Z86711 Personal history of pulmonary embolism: Secondary | ICD-10-CM | POA: Diagnosis not present

## 2021-08-31 DIAGNOSIS — Z9221 Personal history of antineoplastic chemotherapy: Secondary | ICD-10-CM | POA: Diagnosis not present

## 2021-08-31 DIAGNOSIS — C7951 Secondary malignant neoplasm of bone: Secondary | ICD-10-CM | POA: Diagnosis not present

## 2021-08-31 DIAGNOSIS — C50919 Malignant neoplasm of unspecified site of unspecified female breast: Secondary | ICD-10-CM | POA: Diagnosis not present

## 2021-08-31 DIAGNOSIS — C7800 Secondary malignant neoplasm of unspecified lung: Secondary | ICD-10-CM | POA: Diagnosis not present

## 2021-08-31 DIAGNOSIS — K219 Gastro-esophageal reflux disease without esophagitis: Secondary | ICD-10-CM | POA: Diagnosis not present

## 2021-08-31 DIAGNOSIS — Z9011 Acquired absence of right breast and nipple: Secondary | ICD-10-CM | POA: Diagnosis not present

## 2021-08-31 DIAGNOSIS — E1142 Type 2 diabetes mellitus with diabetic polyneuropathy: Secondary | ICD-10-CM | POA: Diagnosis not present

## 2021-08-31 DIAGNOSIS — I10 Essential (primary) hypertension: Secondary | ICD-10-CM | POA: Diagnosis not present

## 2021-08-31 DIAGNOSIS — Z9981 Dependence on supplemental oxygen: Secondary | ICD-10-CM | POA: Diagnosis not present

## 2021-08-31 DIAGNOSIS — Z803 Family history of malignant neoplasm of breast: Secondary | ICD-10-CM | POA: Diagnosis not present

## 2021-09-04 ENCOUNTER — Encounter: Payer: Self-pay | Admitting: Oncology

## 2021-09-04 DIAGNOSIS — I10 Essential (primary) hypertension: Secondary | ICD-10-CM | POA: Diagnosis not present

## 2021-09-04 DIAGNOSIS — C7951 Secondary malignant neoplasm of bone: Secondary | ICD-10-CM | POA: Diagnosis not present

## 2021-09-04 DIAGNOSIS — Z86711 Personal history of pulmonary embolism: Secondary | ICD-10-CM | POA: Diagnosis not present

## 2021-09-04 DIAGNOSIS — C7800 Secondary malignant neoplasm of unspecified lung: Secondary | ICD-10-CM | POA: Diagnosis not present

## 2021-09-04 DIAGNOSIS — C50919 Malignant neoplasm of unspecified site of unspecified female breast: Secondary | ICD-10-CM | POA: Diagnosis not present

## 2021-09-04 DIAGNOSIS — E1142 Type 2 diabetes mellitus with diabetic polyneuropathy: Secondary | ICD-10-CM | POA: Diagnosis not present

## 2021-09-08 DIAGNOSIS — Z86711 Personal history of pulmonary embolism: Secondary | ICD-10-CM | POA: Diagnosis not present

## 2021-09-08 DIAGNOSIS — C7800 Secondary malignant neoplasm of unspecified lung: Secondary | ICD-10-CM | POA: Diagnosis not present

## 2021-09-08 DIAGNOSIS — C7951 Secondary malignant neoplasm of bone: Secondary | ICD-10-CM | POA: Diagnosis not present

## 2021-09-08 DIAGNOSIS — E1142 Type 2 diabetes mellitus with diabetic polyneuropathy: Secondary | ICD-10-CM | POA: Diagnosis not present

## 2021-09-08 DIAGNOSIS — I10 Essential (primary) hypertension: Secondary | ICD-10-CM | POA: Diagnosis not present

## 2021-09-08 DIAGNOSIS — C50919 Malignant neoplasm of unspecified site of unspecified female breast: Secondary | ICD-10-CM | POA: Diagnosis not present

## 2021-09-11 ENCOUNTER — Other Ambulatory Visit (HOSPITAL_COMMUNITY): Payer: Self-pay

## 2021-09-11 DIAGNOSIS — I10 Essential (primary) hypertension: Secondary | ICD-10-CM | POA: Diagnosis not present

## 2021-09-11 DIAGNOSIS — Z86711 Personal history of pulmonary embolism: Secondary | ICD-10-CM | POA: Diagnosis not present

## 2021-09-11 DIAGNOSIS — E1142 Type 2 diabetes mellitus with diabetic polyneuropathy: Secondary | ICD-10-CM | POA: Diagnosis not present

## 2021-09-11 DIAGNOSIS — C7951 Secondary malignant neoplasm of bone: Secondary | ICD-10-CM | POA: Diagnosis not present

## 2021-09-11 DIAGNOSIS — C7800 Secondary malignant neoplasm of unspecified lung: Secondary | ICD-10-CM | POA: Diagnosis not present

## 2021-09-11 DIAGNOSIS — C50919 Malignant neoplasm of unspecified site of unspecified female breast: Secondary | ICD-10-CM | POA: Diagnosis not present

## 2021-09-12 ENCOUNTER — Encounter: Payer: Self-pay | Admitting: Oncology

## 2021-09-12 DIAGNOSIS — E1142 Type 2 diabetes mellitus with diabetic polyneuropathy: Secondary | ICD-10-CM | POA: Diagnosis not present

## 2021-09-12 DIAGNOSIS — C7951 Secondary malignant neoplasm of bone: Secondary | ICD-10-CM | POA: Diagnosis not present

## 2021-09-12 DIAGNOSIS — I10 Essential (primary) hypertension: Secondary | ICD-10-CM | POA: Diagnosis not present

## 2021-09-12 DIAGNOSIS — C50919 Malignant neoplasm of unspecified site of unspecified female breast: Secondary | ICD-10-CM | POA: Diagnosis not present

## 2021-09-12 DIAGNOSIS — C7800 Secondary malignant neoplasm of unspecified lung: Secondary | ICD-10-CM | POA: Diagnosis not present

## 2021-09-12 DIAGNOSIS — Z86711 Personal history of pulmonary embolism: Secondary | ICD-10-CM | POA: Diagnosis not present

## 2021-09-13 DIAGNOSIS — I10 Essential (primary) hypertension: Secondary | ICD-10-CM | POA: Diagnosis not present

## 2021-09-13 DIAGNOSIS — Z86711 Personal history of pulmonary embolism: Secondary | ICD-10-CM | POA: Diagnosis not present

## 2021-09-13 DIAGNOSIS — C7800 Secondary malignant neoplasm of unspecified lung: Secondary | ICD-10-CM | POA: Diagnosis not present

## 2021-09-13 DIAGNOSIS — C50919 Malignant neoplasm of unspecified site of unspecified female breast: Secondary | ICD-10-CM | POA: Diagnosis not present

## 2021-09-13 DIAGNOSIS — E1142 Type 2 diabetes mellitus with diabetic polyneuropathy: Secondary | ICD-10-CM | POA: Diagnosis not present

## 2021-09-13 DIAGNOSIS — C7951 Secondary malignant neoplasm of bone: Secondary | ICD-10-CM | POA: Diagnosis not present

## 2021-09-14 DIAGNOSIS — Z86711 Personal history of pulmonary embolism: Secondary | ICD-10-CM | POA: Diagnosis not present

## 2021-09-14 DIAGNOSIS — C7951 Secondary malignant neoplasm of bone: Secondary | ICD-10-CM | POA: Diagnosis not present

## 2021-09-14 DIAGNOSIS — E1142 Type 2 diabetes mellitus with diabetic polyneuropathy: Secondary | ICD-10-CM | POA: Diagnosis not present

## 2021-09-14 DIAGNOSIS — I10 Essential (primary) hypertension: Secondary | ICD-10-CM | POA: Diagnosis not present

## 2021-09-14 DIAGNOSIS — C7800 Secondary malignant neoplasm of unspecified lung: Secondary | ICD-10-CM | POA: Diagnosis not present

## 2021-09-14 DIAGNOSIS — C50919 Malignant neoplasm of unspecified site of unspecified female breast: Secondary | ICD-10-CM | POA: Diagnosis not present

## 2021-09-15 DIAGNOSIS — C50919 Malignant neoplasm of unspecified site of unspecified female breast: Secondary | ICD-10-CM | POA: Diagnosis not present

## 2021-09-15 DIAGNOSIS — Z86711 Personal history of pulmonary embolism: Secondary | ICD-10-CM | POA: Diagnosis not present

## 2021-09-15 DIAGNOSIS — E1142 Type 2 diabetes mellitus with diabetic polyneuropathy: Secondary | ICD-10-CM | POA: Diagnosis not present

## 2021-09-15 DIAGNOSIS — C7800 Secondary malignant neoplasm of unspecified lung: Secondary | ICD-10-CM | POA: Diagnosis not present

## 2021-09-15 DIAGNOSIS — C7951 Secondary malignant neoplasm of bone: Secondary | ICD-10-CM | POA: Diagnosis not present

## 2021-09-15 DIAGNOSIS — I10 Essential (primary) hypertension: Secondary | ICD-10-CM | POA: Diagnosis not present

## 2021-09-16 DIAGNOSIS — Z86711 Personal history of pulmonary embolism: Secondary | ICD-10-CM | POA: Diagnosis not present

## 2021-09-16 DIAGNOSIS — I10 Essential (primary) hypertension: Secondary | ICD-10-CM | POA: Diagnosis not present

## 2021-09-16 DIAGNOSIS — C50919 Malignant neoplasm of unspecified site of unspecified female breast: Secondary | ICD-10-CM | POA: Diagnosis not present

## 2021-09-16 DIAGNOSIS — E1142 Type 2 diabetes mellitus with diabetic polyneuropathy: Secondary | ICD-10-CM | POA: Diagnosis not present

## 2021-09-16 DIAGNOSIS — C7800 Secondary malignant neoplasm of unspecified lung: Secondary | ICD-10-CM | POA: Diagnosis not present

## 2021-09-16 DIAGNOSIS — C7951 Secondary malignant neoplasm of bone: Secondary | ICD-10-CM | POA: Diagnosis not present

## 2021-09-17 DIAGNOSIS — Z86711 Personal history of pulmonary embolism: Secondary | ICD-10-CM | POA: Diagnosis not present

## 2021-09-17 DIAGNOSIS — C7951 Secondary malignant neoplasm of bone: Secondary | ICD-10-CM | POA: Diagnosis not present

## 2021-09-17 DIAGNOSIS — I10 Essential (primary) hypertension: Secondary | ICD-10-CM | POA: Diagnosis not present

## 2021-09-17 DIAGNOSIS — C7800 Secondary malignant neoplasm of unspecified lung: Secondary | ICD-10-CM | POA: Diagnosis not present

## 2021-09-17 DIAGNOSIS — C50919 Malignant neoplasm of unspecified site of unspecified female breast: Secondary | ICD-10-CM | POA: Diagnosis not present

## 2021-09-17 DIAGNOSIS — E1142 Type 2 diabetes mellitus with diabetic polyneuropathy: Secondary | ICD-10-CM | POA: Diagnosis not present

## 2021-09-18 DIAGNOSIS — Z86711 Personal history of pulmonary embolism: Secondary | ICD-10-CM | POA: Diagnosis not present

## 2021-09-18 DIAGNOSIS — C7800 Secondary malignant neoplasm of unspecified lung: Secondary | ICD-10-CM | POA: Diagnosis not present

## 2021-09-18 DIAGNOSIS — I10 Essential (primary) hypertension: Secondary | ICD-10-CM | POA: Diagnosis not present

## 2021-09-18 DIAGNOSIS — C7951 Secondary malignant neoplasm of bone: Secondary | ICD-10-CM | POA: Diagnosis not present

## 2021-09-18 DIAGNOSIS — E1142 Type 2 diabetes mellitus with diabetic polyneuropathy: Secondary | ICD-10-CM | POA: Diagnosis not present

## 2021-09-18 DIAGNOSIS — C50919 Malignant neoplasm of unspecified site of unspecified female breast: Secondary | ICD-10-CM | POA: Diagnosis not present

## 2021-09-19 ENCOUNTER — Telehealth: Payer: Self-pay | Admitting: Family Medicine

## 2021-09-19 ENCOUNTER — Telehealth: Admitting: Family Medicine

## 2021-09-19 NOTE — Telephone Encounter (Signed)
Pts husband called and said that he has been meaning to call and cancel his appt for today but it slipped his mind because of everything going on, he said his wife is in hospice care and is dying. He said he wanted to thank Dr Gena Fray for being such a great doctor for the short time that they got to know him and that he had great bedside manner.

## 2021-09-20 DIAGNOSIS — C7951 Secondary malignant neoplasm of bone: Secondary | ICD-10-CM | POA: Diagnosis not present

## 2021-09-20 DIAGNOSIS — E1142 Type 2 diabetes mellitus with diabetic polyneuropathy: Secondary | ICD-10-CM | POA: Diagnosis not present

## 2021-09-20 DIAGNOSIS — I10 Essential (primary) hypertension: Secondary | ICD-10-CM | POA: Diagnosis not present

## 2021-09-20 DIAGNOSIS — C7800 Secondary malignant neoplasm of unspecified lung: Secondary | ICD-10-CM | POA: Diagnosis not present

## 2021-09-20 DIAGNOSIS — C50919 Malignant neoplasm of unspecified site of unspecified female breast: Secondary | ICD-10-CM | POA: Diagnosis not present

## 2021-09-20 DIAGNOSIS — Z86711 Personal history of pulmonary embolism: Secondary | ICD-10-CM | POA: Diagnosis not present

## 2021-09-21 DIAGNOSIS — C7951 Secondary malignant neoplasm of bone: Secondary | ICD-10-CM | POA: Diagnosis not present

## 2021-09-21 DIAGNOSIS — E1142 Type 2 diabetes mellitus with diabetic polyneuropathy: Secondary | ICD-10-CM | POA: Diagnosis not present

## 2021-09-21 DIAGNOSIS — C7800 Secondary malignant neoplasm of unspecified lung: Secondary | ICD-10-CM | POA: Diagnosis not present

## 2021-09-21 DIAGNOSIS — C50919 Malignant neoplasm of unspecified site of unspecified female breast: Secondary | ICD-10-CM | POA: Diagnosis not present

## 2021-09-21 DIAGNOSIS — Z86711 Personal history of pulmonary embolism: Secondary | ICD-10-CM | POA: Diagnosis not present

## 2021-09-21 DIAGNOSIS — I10 Essential (primary) hypertension: Secondary | ICD-10-CM | POA: Diagnosis not present

## 2021-09-22 ENCOUNTER — Encounter: Payer: Self-pay | Admitting: Family Medicine

## 2021-09-22 NOTE — Progress Notes (Unsigned)
Received notice from Hospice that Mia [name] died on Oct 09, 2021 at home.

## 2021-10-01 DEATH — deceased

## 2021-12-07 IMAGING — MR MR SHOULDER*R* WO/W CM
8 of 10 series · 21 of 40 positions shown · IV contrast (gadavist)
Comparison: Right humerus x-rays from yesterday. Right shoulder
x-rays dated October 24, 2020.

CLINICAL DATA: Pathologic proximal humerus fracture. History of
metastatic breast cancer.

EXAM:
MRI OF THE RIGHT SHOULDER WITHOUT AND WITH CONTRAST; MRI OF THE
RIGHT HUMERUS WITHOUT AND WITH CONTRAST
TECHNIQUE: Multiplanar, multisequence MR imaging of the right shoulder and
humerus was performed before and after the administration of
intravenous contrast.
CONTRAST:  10mL GADAVIST GADOBUTROL 1 MMOL/ML IV SOLN

[Series 6: T2 fat-sat · axial · right · 4.0mm · 0.62mm/px · z∈[-71,+51]mm · 3 of 29 slices shown (1 of 3)]
[im 1/29]
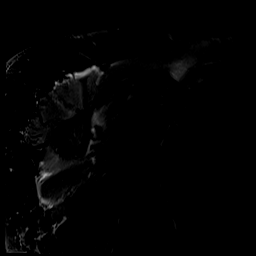
[im 15/29]
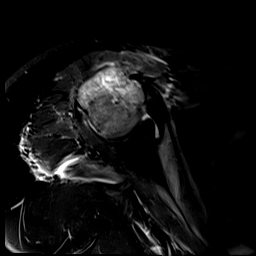
[im 29/29]
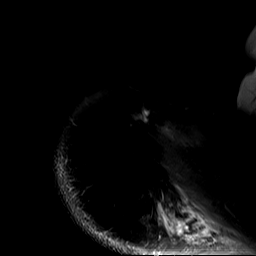

[Series 7: T2 fat-sat · oblique · right · 4.0mm · 0.50mm/px · 2 of 19 slices shown (2 of 3)]
[im 1/19]
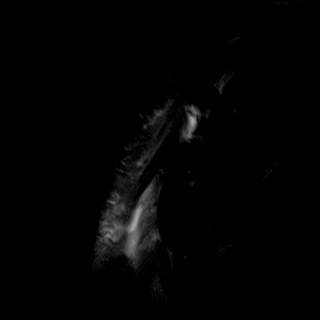
[im 19/19]
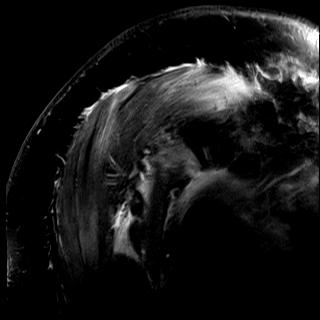

[Series 8: PD · oblique · right · 4.0mm · 0.50mm/px · 2 of 19 slices shown]
[im 1/19]
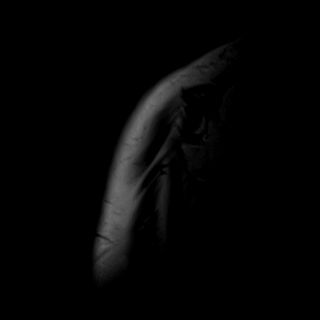
[im 19/19]
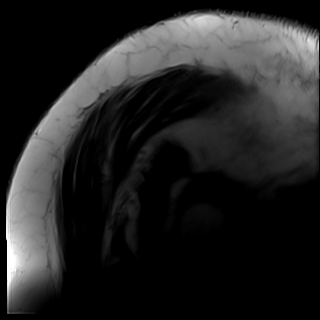

[Series 9: T1 · oblique · right · 3.0mm · 0.59mm/px · 3 of 25 slices shown]
[im 1/25]
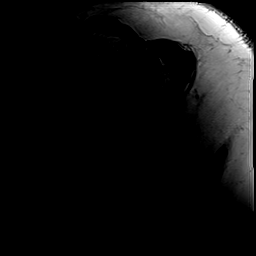
[im 13/25]
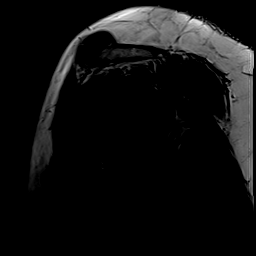
[im 25/25]
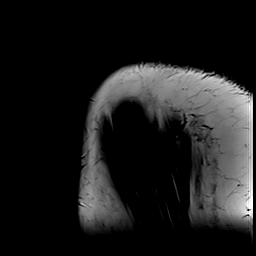

[Series 10: T2 fat-sat · oblique · right · 3.0mm · 0.59mm/px · 3 of 25 slices shown (3 of 3)]
[im 1/25]
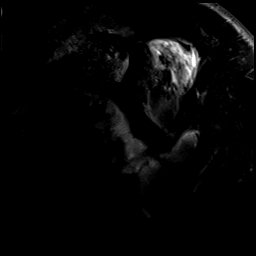
[im 13/25]
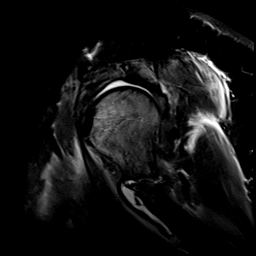
[im 25/25]
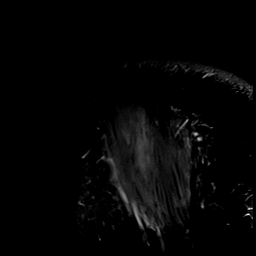

[Series 11: T1 fat-sat · axial · non-contrast · right · 4.0mm · 0.31mm/px · z∈[-71,+51]mm · 3 of 29 slices shown (1 of 2)]
[im 1/29]
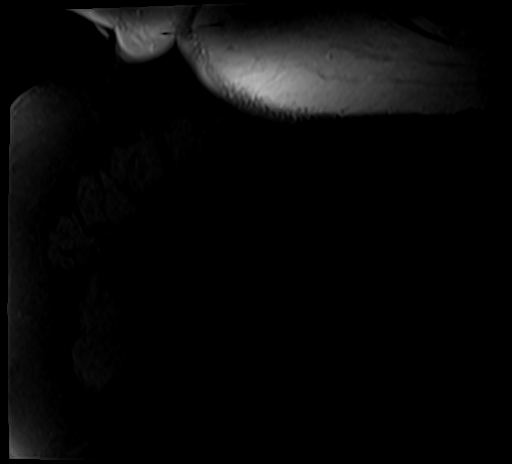
[im 15/29]
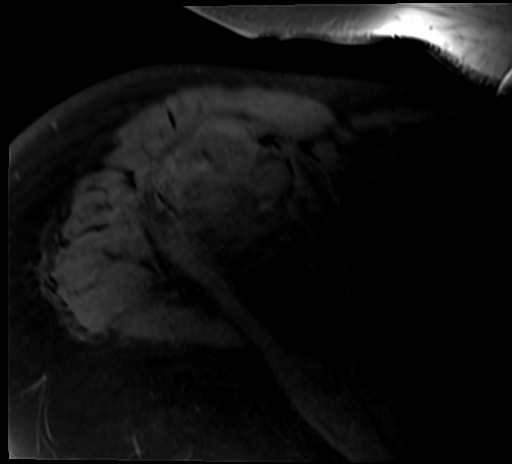
[im 29/29]
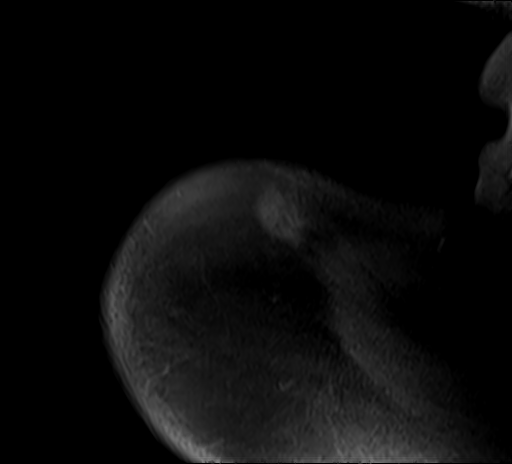

[Series 12: T1 fat-sat post-contrast · axial · right · 4.0mm · 0.31mm/px · z∈[-71,+51]mm · 3 of 29 slices shown]
[im 1/29]
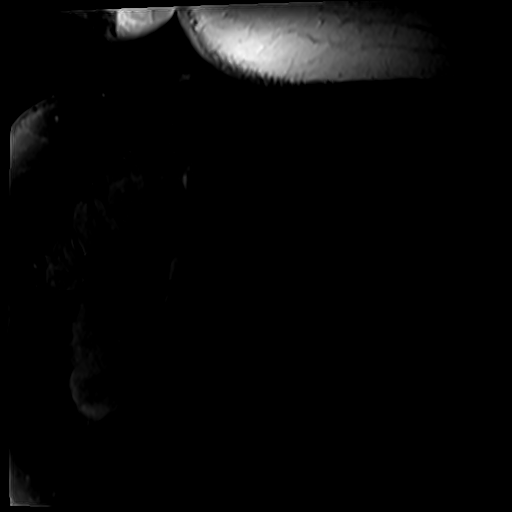
[im 15/29]
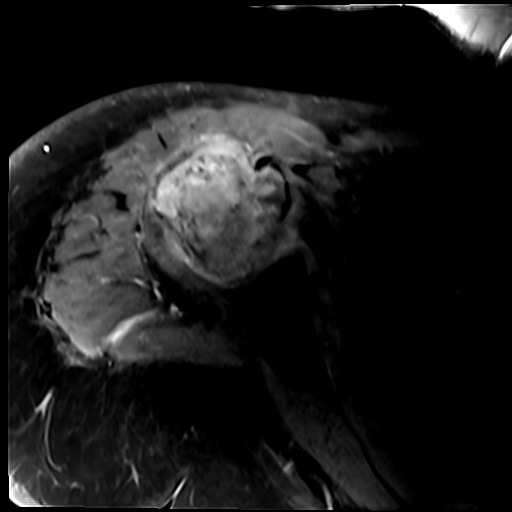
[im 29/29]
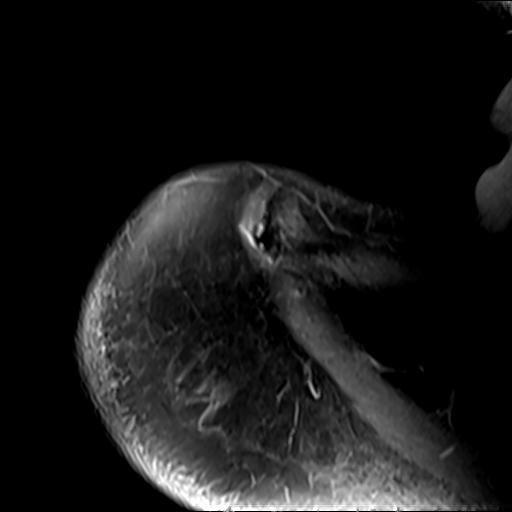

[Series 13: T1 fat-sat · oblique · right · 4.0mm · 0.29mm/px · 2 of 19 slices shown (2 of 2)]
[im 1/19]
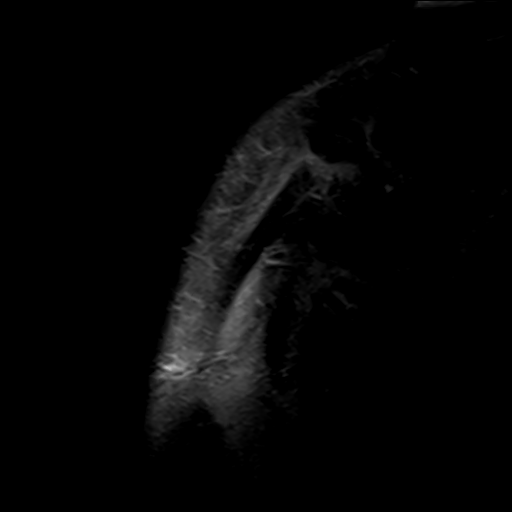
[im 19/19]
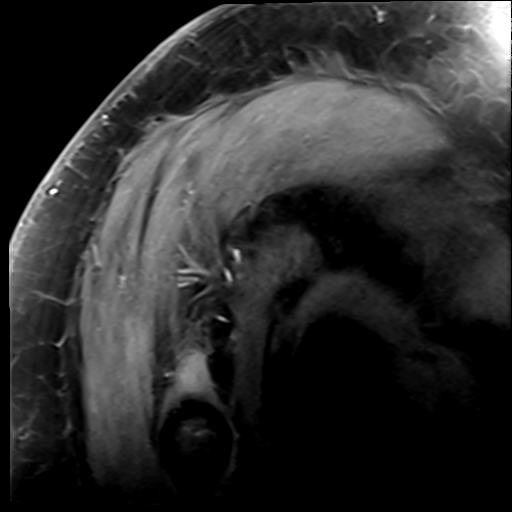

[21 of 40 positions shown; findings below may reference images not displayed]

FINDINGS: Rotator cuff:  Intact rotator cuff.  Moderate tendinosis.

Muscles: Diffuse muscle edema about the right shoulder, presumably
related to prior radiation. No significant atrophy.

Biceps long head:  Intact and normally positioned.

Acromioclavicular Joint: Moderate arthropathy of the
acromioclavicular joint. Type I acromion. No subacromial/subdeltoid
bursal fluid.

Glenohumeral Joint: Small joint effusion.  No chondral defect.

Labrum:  Intact.

Bones: Large marrow replacing lesion involving the majority of the
humeral head and proximal metadiaphysis, spanning a length of
approximately 8.5 cm, best appreciated on the sagittal T1 images.
There is a subacute mildly displaced pathologic fracture of the
proximal metadiaphysis with 5 mm anterior displacement. There is a
small amount of subperiosteal fluid.

No additional metastatic lesions involving the right upper arm.
Incompletely evaluated metastases involving multiple thoracic
vertebral bodies.

Other: Scattered soft tissue swelling of the right upper arm.
Partially visualized left pleural effusion.
IMPRESSION: 1. Large osseous metastasis involving the majority of the humeral
head and proximal metadiaphysis, spanning a length of approximately
8.5 cm. Associated subacute mildly displaced pathologic fracture of
the proximal metadiaphysis.
2. No additional metastatic lesions involving the right upper arm.
3. Incompletely evaluated thoracic vertebral metastatic disease.
4. Intact rotator cuff with moderate tendinosis.

## 2021-12-10 IMAGING — DX DG CHEST 1V PORT
1 series · 1 of 1 positions shown · non-contrast
Comparison: January 06, 2021 chest radiograph and right humerus
radiographs

CLINICAL DATA: Pleural effusion with cough.  Breast carcinoma

EXAM:
PORTABLE CHEST 1 VIEW

[chest ap]
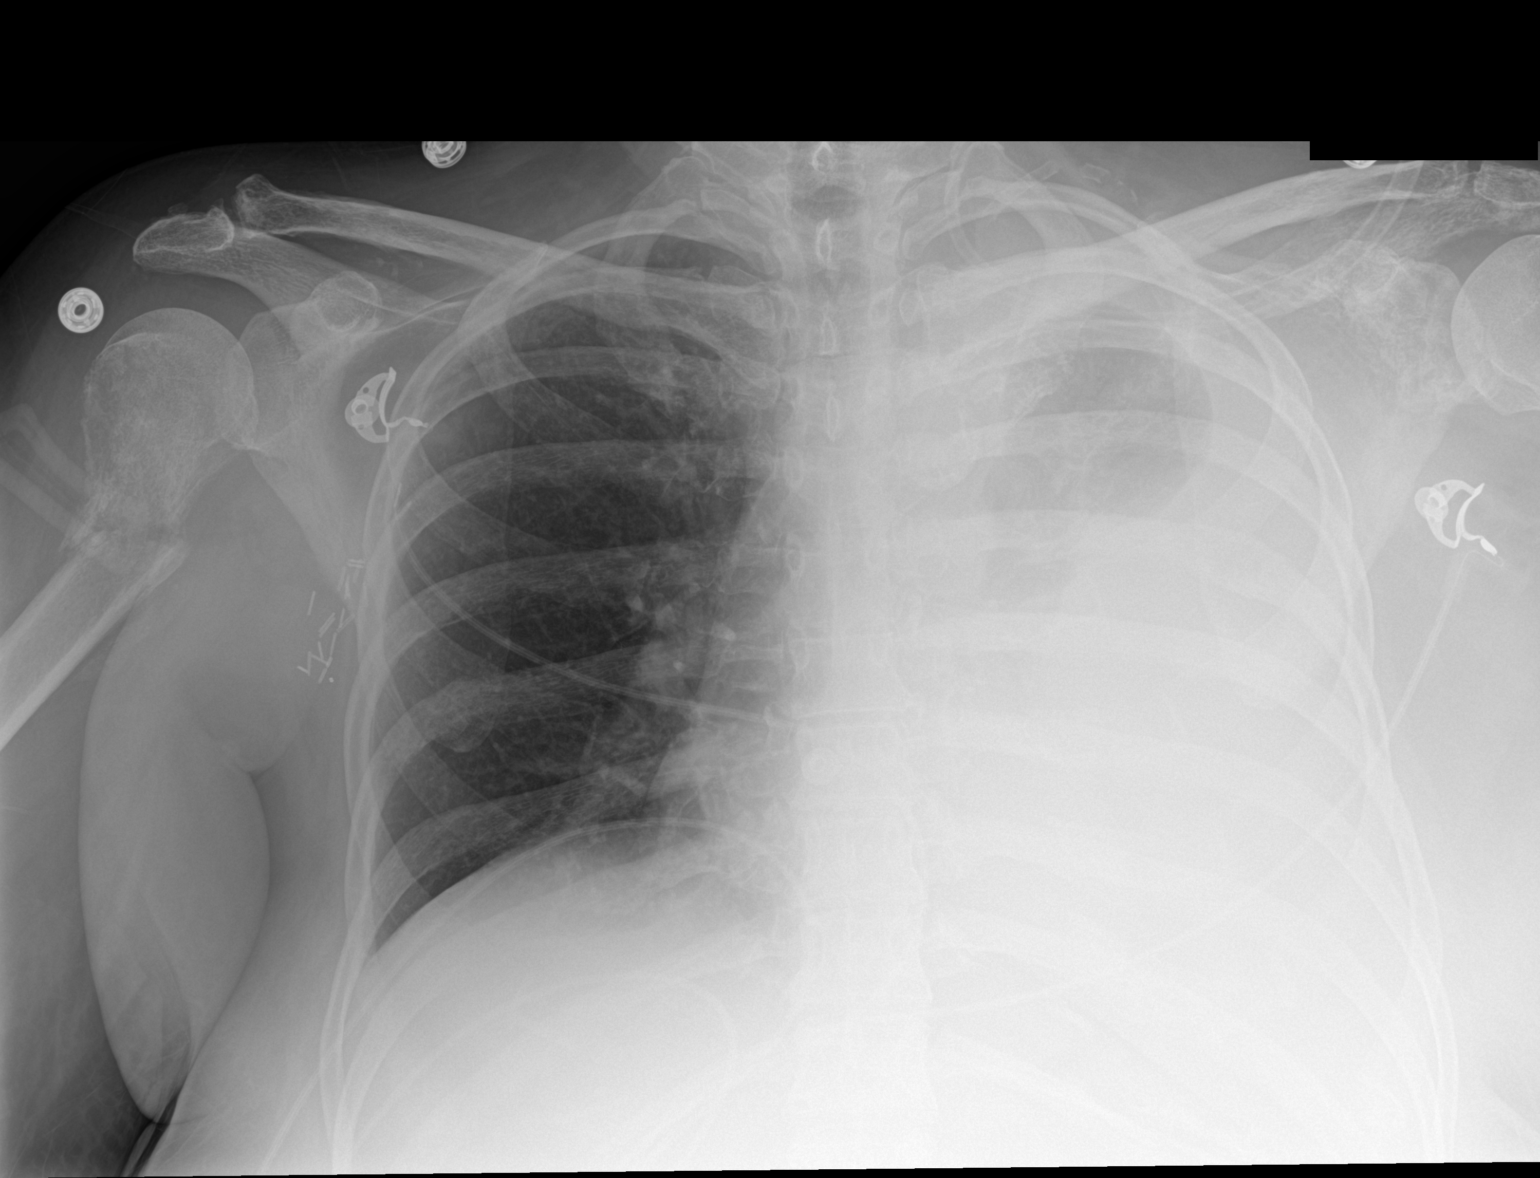

[1 of 1 positions shown; findings below may reference images not displayed]

FINDINGS: Most of the left hemithorax remains opacified, likely due to
combination of pleural effusion and atelectasis/consolidation
throughout most of the left lung. Mild aeration remains in a portion
of the left upper lobe. The right lung is clear. Heart is prominent,
stable, with normal appearing pulmonary vascularity on the right.
Pulmonary vascularity on the left is obscured by apparent effusion
and infiltrate. Surgical clips noted in the right axilla.
Ill-defined mixed sclerotic and lytic changes noted in the left
glenoid region. Apparent pathologic fracture proximal right humerus
again noted.
IMPRESSION: Opacification of most of the left hemithorax, likely due to
combination of sizable pleural effusion and areas of
atelectasis/consolidation. Right lung clear. Grossly stable cardiac
silhouette. Bony metastases noted. Apparent pathologic fracture
proximal right humerus, documented previously and not significantly
changed. Surgical clips in right axilla.

## 2021-12-11 IMAGING — DX DG HUMERUS 2V *R*
2 series · 2 of 2 positions shown · non-contrast
Comparison: 01/06/2021

CLINICAL DATA: Right humerus fracture.

EXAM:
RIGHT HUMERUS - 2+ VIEW

[humerus ap]
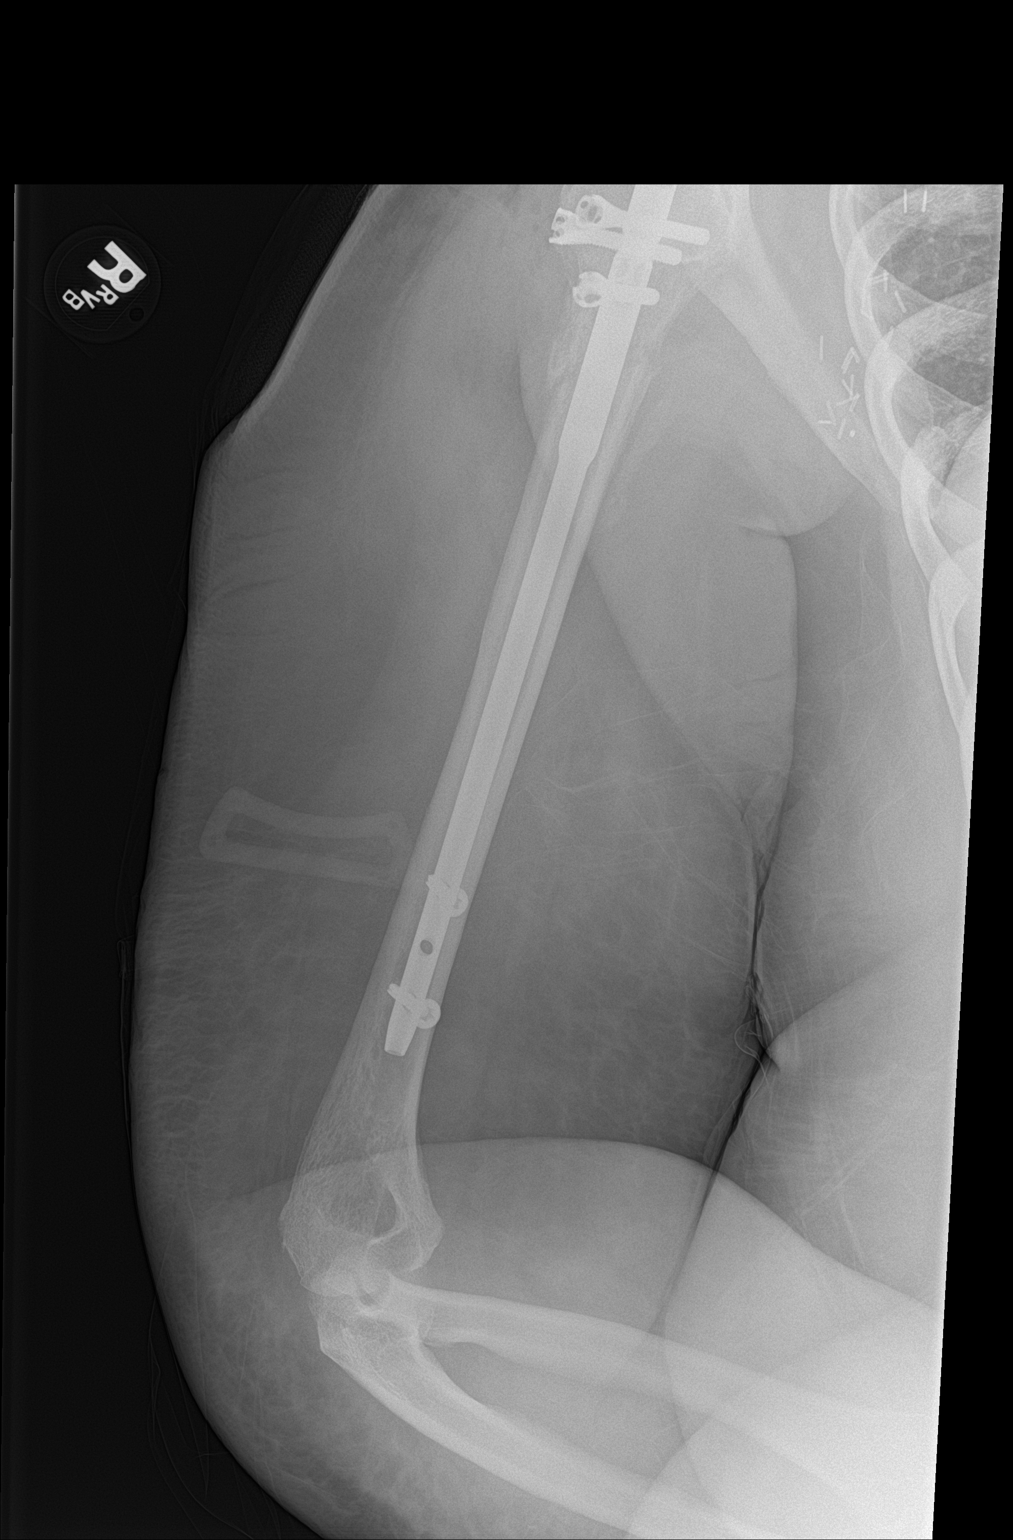

[humerus lat]
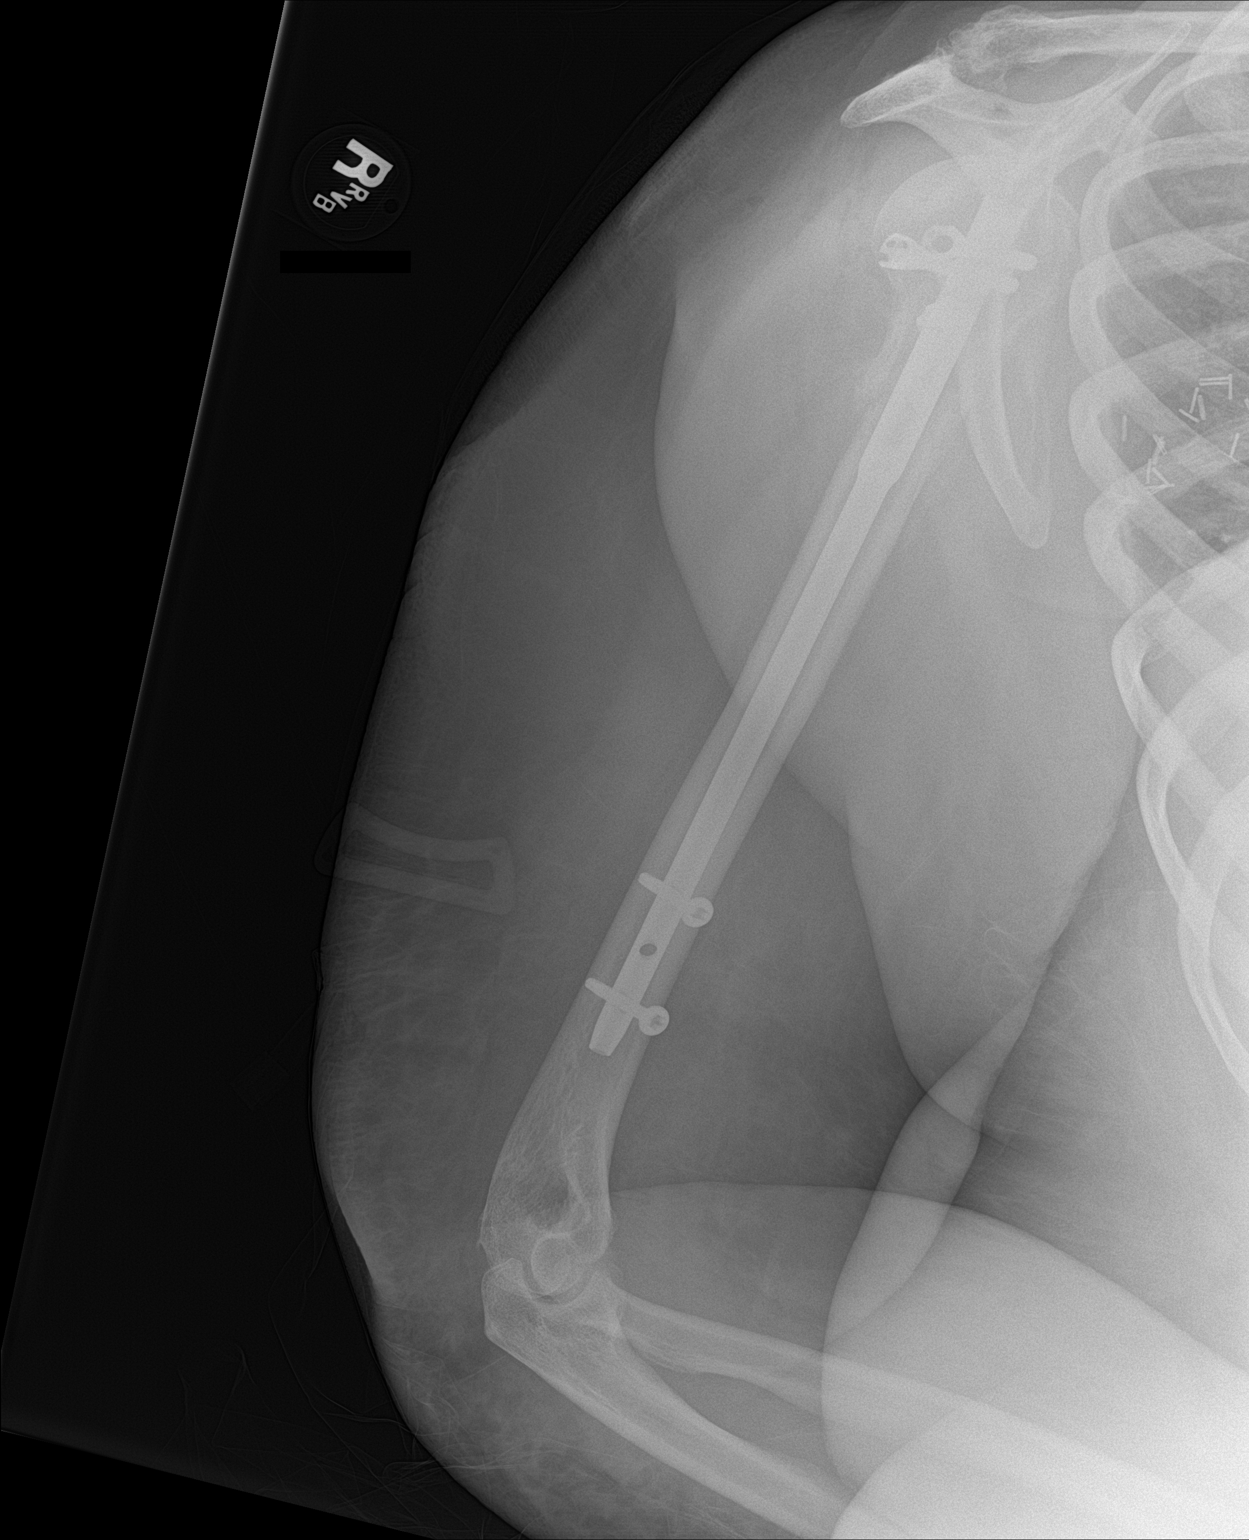

[2 of 2 positions shown; findings below may reference images not displayed]

FINDINGS: Again noted is a fracture involving the proximal humerus.
Intramedullary rod has been placed with 3 proximal interlocking
screws and 2 distal interlocking screws. Improved alignment of the
humerus with near anatomic alignment. Right shoulder appears located
on these views. Extensive degenerative changes at the right AC
joint.
IMPRESSION: Internal fixation of the proximal right humerus fracture. Improved
alignment of the humerus.
